# Patient Record
Sex: Male | Born: 1940
Health system: Southern US, Community
[De-identification: ages and names within clinical notes are randomized; demographics above are authoritative.]

## PROBLEM LIST (undated history)

## (undated) DIAGNOSIS — R0602 Shortness of breath: Secondary | ICD-10-CM

## (undated) DIAGNOSIS — E669 Obesity, unspecified: Secondary | ICD-10-CM

## (undated) DIAGNOSIS — F32A Depression, unspecified: Secondary | ICD-10-CM

## (undated) DIAGNOSIS — F419 Anxiety disorder, unspecified: Secondary | ICD-10-CM

## (undated) DIAGNOSIS — G4733 Obstructive sleep apnea (adult) (pediatric): Secondary | ICD-10-CM

## (undated) DIAGNOSIS — H3320 Serous retinal detachment, unspecified eye: Secondary | ICD-10-CM

## (undated) DIAGNOSIS — E785 Hyperlipidemia, unspecified: Secondary | ICD-10-CM

## (undated) DIAGNOSIS — Z9889 Other specified postprocedural states: Secondary | ICD-10-CM

## (undated) DIAGNOSIS — M1990 Unspecified osteoarthritis, unspecified site: Secondary | ICD-10-CM

## (undated) DIAGNOSIS — R112 Nausea with vomiting, unspecified: Secondary | ICD-10-CM

## (undated) DIAGNOSIS — I10 Essential (primary) hypertension: Secondary | ICD-10-CM

## (undated) DIAGNOSIS — Z87442 Personal history of urinary calculi: Secondary | ICD-10-CM

## (undated) DIAGNOSIS — Z9289 Personal history of other medical treatment: Secondary | ICD-10-CM

## (undated) DIAGNOSIS — F329 Major depressive disorder, single episode, unspecified: Secondary | ICD-10-CM

## (undated) DIAGNOSIS — C801 Malignant (primary) neoplasm, unspecified: Secondary | ICD-10-CM

## (undated) DIAGNOSIS — T8859XA Other complications of anesthesia, initial encounter: Secondary | ICD-10-CM

## (undated) DIAGNOSIS — R42 Dizziness and giddiness: Secondary | ICD-10-CM

## (undated) DIAGNOSIS — R519 Headache, unspecified: Secondary | ICD-10-CM

## (undated) DIAGNOSIS — I4891 Unspecified atrial fibrillation: Secondary | ICD-10-CM

## (undated) DIAGNOSIS — I209 Angina pectoris, unspecified: Secondary | ICD-10-CM

## (undated) DIAGNOSIS — G4730 Sleep apnea, unspecified: Secondary | ICD-10-CM

## (undated) DIAGNOSIS — I2510 Atherosclerotic heart disease of native coronary artery without angina pectoris: Secondary | ICD-10-CM

## (undated) DIAGNOSIS — R51 Headache: Secondary | ICD-10-CM

## (undated) DIAGNOSIS — T4145XA Adverse effect of unspecified anesthetic, initial encounter: Secondary | ICD-10-CM

## (undated) HISTORY — DX: Obesity, unspecified: E66.9

## (undated) HISTORY — PX: TONSILLECTOMY: SUR1361

## (undated) HISTORY — PX: EYE SURGERY: SHX253

## (undated) HISTORY — DX: Atherosclerotic heart disease of native coronary artery without angina pectoris: I25.10

## (undated) HISTORY — DX: Hyperlipidemia, unspecified: E78.5

## (undated) HISTORY — PX: APPENDECTOMY: SHX54

## (undated) HISTORY — PX: NASAL SINUS SURGERY: SHX719

## (undated) HISTORY — PX: JOINT REPLACEMENT: SHX530

## (undated) HISTORY — DX: Personal history of other medical treatment: Z92.89

## (undated) HISTORY — DX: Obstructive sleep apnea (adult) (pediatric): G47.33

---

## 2001-06-21 ENCOUNTER — Other Ambulatory Visit: Admission: RE | Admit: 2001-06-21 | Discharge: 2001-06-21 | Payer: Self-pay | Admitting: Dermatology

## 2001-07-10 ENCOUNTER — Ambulatory Visit (HOSPITAL_COMMUNITY): Admission: RE | Admit: 2001-07-10 | Discharge: 2001-07-10 | Payer: Self-pay | Admitting: General Surgery

## 2003-03-20 ENCOUNTER — Other Ambulatory Visit: Admission: RE | Admit: 2003-03-20 | Discharge: 2003-03-20 | Payer: Self-pay | Admitting: Dermatology

## 2003-10-22 ENCOUNTER — Ambulatory Visit (HOSPITAL_COMMUNITY): Admission: RE | Admit: 2003-10-22 | Discharge: 2003-10-22 | Payer: Self-pay | Admitting: Urology

## 2004-11-19 ENCOUNTER — Other Ambulatory Visit: Admission: RE | Admit: 2004-11-19 | Discharge: 2004-11-19 | Payer: Self-pay | Admitting: Dermatology

## 2005-06-10 ENCOUNTER — Ambulatory Visit (HOSPITAL_COMMUNITY): Admission: RE | Admit: 2005-06-10 | Discharge: 2005-06-10 | Payer: Self-pay | Admitting: Family Medicine

## 2005-07-13 HISTORY — PX: CARDIAC CATHETERIZATION: SHX172

## 2005-09-19 ENCOUNTER — Ambulatory Visit (HOSPITAL_COMMUNITY): Admission: RE | Admit: 2005-09-19 | Discharge: 2005-09-19 | Payer: Self-pay | Admitting: General Surgery

## 2009-02-19 DIAGNOSIS — Z9289 Personal history of other medical treatment: Secondary | ICD-10-CM

## 2009-02-19 HISTORY — DX: Personal history of other medical treatment: Z92.89

## 2010-05-26 ENCOUNTER — Ambulatory Visit (HOSPITAL_COMMUNITY)
Admission: RE | Admit: 2010-05-26 | Discharge: 2010-05-26 | Payer: Self-pay | Source: Home / Self Care | Attending: Ophthalmology | Admitting: Ophthalmology

## 2010-07-08 ENCOUNTER — Ambulatory Visit (HOSPITAL_COMMUNITY)
Admission: RE | Admit: 2010-07-08 | Discharge: 2010-07-08 | Disposition: A | Discharge status: Home / Self Care | Payer: Medicare Other | Attending: Ophthalmology | Admitting: Ophthalmology

## 2010-07-08 DIAGNOSIS — Z79899 Other long term (current) drug therapy: Secondary | ICD-10-CM | POA: Insufficient documentation

## 2010-07-08 DIAGNOSIS — H2510 Age-related nuclear cataract, unspecified eye: Secondary | ICD-10-CM | POA: Insufficient documentation

## 2010-07-08 DIAGNOSIS — I10 Essential (primary) hypertension: Secondary | ICD-10-CM | POA: Insufficient documentation

## 2010-08-17 LAB — BASIC METABOLIC PANEL
CO2: 29 mEq/L (ref 19–32)
Chloride: 103 mEq/L (ref 96–112)
Creatinine, Ser: 0.9 mg/dL (ref 0.4–1.5)
GFR calc Af Amer: >60 mL/min (ref >60)
GFR calc non Af Amer: >60 mL/min (ref >60)

## 2010-08-17 LAB — HEMOGLOBIN AND HEMATOCRIT, BLOOD: Hemoglobin: 15.3 g/dL (ref 13.0–17.0)

## 2010-10-22 NOTE — H&P (Signed)
NAME:  Paul Lam, Paul Lam                ACCOUNT NO.:  [id number]   MEDICAL RECORD NO.:  [id number]          PATIENT TYPE:  AMB   LOCATION:                                FACILITY:  [facility]   PHYSICIAN:  Dalia Heading, M.D.  DATE OF BIRTH:  1940/10/05   DATE OF ADMISSION:  DATE OF DISCHARGE:  LH                                HISTORY & PHYSICAL   CHIEF COMPLAINT:  History of colon polyps.   HISTORY OF PRESENT ILLNESS:  The patient is a 70 year old white male status  post colonoscopy with polypectomy in 2003 for a polyp seen in the proximal  transverse colon as well as a history of diverticulosis in the descending  colon who presents for followup colonoscopy.  He denies any gastrointestinal  symptoms.  He denies any family history colon carcinoma.   PAST MEDICAL HISTORY:  Includes hypertension and back spasms.   PAST SURGICAL HISTORY:  Tonsillectomy, appendectomy.   CURRENT MEDICATIONS:  Ziac, baby aspirin, Naprosyn, Flexeril.   ALLERGIES:  No known drug allergies.   REVIEW OF SYSTEMS:  The patient denies drinking or smoking.  Denies any  other cardiopulmonary difficulties or bleeding disorders.   PHYSICAL EXAMINATION:  GENERAL:  The patient is a well-developed, well-  nourished white male in no acute distress.  LUNGS:  Clear to auscultation with equal breath sounds bilaterally.  HEART:  Reveals a regular rate and rhythm without S3, S4, or murmurs.  ABDOMEN:  Soft, nontender, nondistended.  RECTAL:  Examination was deferred to the procedure.   IMPRESSION:  History of colon polyp.   PLAN:  The patient is scheduled for colonoscopy on September 19, 2005.  The  risks and benefits of the procedure including bleeding and perforation were  fully explained to the patient, who gave informed consent.      Dalia Heading, M.D.  Electronically Signed     MAJ/MEDQ  D:  09/15/2005  T:  09/15/2005  Job:  [id number]   cc:   Kirk Ruths, M.D.  Fax: [phone]

## 2011-03-14 ENCOUNTER — Encounter (INDEPENDENT_AMBULATORY_CARE_PROVIDER_SITE_OTHER): Payer: Medicare Other | Admitting: Ophthalmology

## 2011-03-14 DIAGNOSIS — H33309 Unspecified retinal break, unspecified eye: Secondary | ICD-10-CM

## 2011-03-14 DIAGNOSIS — H2700 Aphakia, unspecified eye: Secondary | ICD-10-CM

## 2011-03-14 DIAGNOSIS — D3130 Benign neoplasm of unspecified choroid: Secondary | ICD-10-CM

## 2011-03-14 DIAGNOSIS — H35039 Hypertensive retinopathy, unspecified eye: Secondary | ICD-10-CM

## 2011-03-17 ENCOUNTER — Encounter (HOSPITAL_COMMUNITY): Payer: Self-pay | Admitting: *Deleted

## 2011-03-25 ENCOUNTER — Encounter (INDEPENDENT_AMBULATORY_CARE_PROVIDER_SITE_OTHER): Payer: Medicare Other | Admitting: Ophthalmology

## 2011-03-25 DIAGNOSIS — H33309 Unspecified retinal break, unspecified eye: Secondary | ICD-10-CM

## 2011-03-25 DIAGNOSIS — H33009 Unspecified retinal detachment with retinal break, unspecified eye: Secondary | ICD-10-CM

## 2011-03-29 ENCOUNTER — Ambulatory Visit (HOSPITAL_COMMUNITY)
Admission: RE | Admit: 2011-03-29 | Discharge: 2011-03-30 | Disposition: A | Discharge status: Home / Self Care | Payer: Medicare Other | Source: Ambulatory Visit | Attending: Ophthalmology | Admitting: Ophthalmology

## 2011-03-29 ENCOUNTER — Ambulatory Visit (HOSPITAL_COMMUNITY): Payer: Medicare Other

## 2011-03-29 DIAGNOSIS — Z9889 Other specified postprocedural states: Secondary | ICD-10-CM

## 2011-03-29 DIAGNOSIS — H33009 Unspecified retinal detachment with retinal break, unspecified eye: Secondary | ICD-10-CM

## 2011-03-29 DIAGNOSIS — N400 Enlarged prostate without lower urinary tract symptoms: Secondary | ICD-10-CM | POA: Insufficient documentation

## 2011-03-29 DIAGNOSIS — Z01812 Encounter for preprocedural laboratory examination: Secondary | ICD-10-CM | POA: Insufficient documentation

## 2011-03-29 DIAGNOSIS — I2510 Atherosclerotic heart disease of native coronary artery without angina pectoris: Secondary | ICD-10-CM | POA: Insufficient documentation

## 2011-03-29 DIAGNOSIS — M109 Gout, unspecified: Secondary | ICD-10-CM | POA: Insufficient documentation

## 2011-03-29 DIAGNOSIS — G4733 Obstructive sleep apnea (adult) (pediatric): Secondary | ICD-10-CM | POA: Insufficient documentation

## 2011-03-29 DIAGNOSIS — K5730 Diverticulosis of large intestine without perforation or abscess without bleeding: Secondary | ICD-10-CM | POA: Insufficient documentation

## 2011-03-29 DIAGNOSIS — Z01818 Encounter for other preprocedural examination: Secondary | ICD-10-CM | POA: Insufficient documentation

## 2011-03-29 LAB — CBC
HCT: 41.7 % (ref 39.0–52.0)
Hemoglobin: 13.7 g/dL (ref 13.0–17.0)
MCHC: 32.9 g/dL (ref 30.0–36.0)
MCV: 86.2 fL (ref 78.0–100.0)
Platelets: 137 10*3/uL — ABNORMAL LOW (ref 150–400)
RDW: 13.3 % (ref 11.5–15.5)

## 2011-03-29 LAB — BASIC METABOLIC PANEL
Chloride: 104 mEq/L (ref 96–112)
Creatinine, Ser: 0.95 mg/dL (ref 0.50–1.35)
Glucose, Bld: 101 mg/dL — ABNORMAL HIGH (ref 70–99)

## 2011-03-31 NOTE — Op Note (Signed)
  NAMECRISTAN, SCHERZER NO.:  [id number]  MEDICAL RECORD NO.:  [id number]  LOCATION:  5123                         FACILITY:  [facility]  PHYSICIAN:  Beulah Gandy. Ashley Royalty, M.D. DATE OF BIRTH:  05/04/1941  DATE OF PROCEDURE:  03/29/2011 DATE OF DISCHARGE:                              OPERATIVE REPORT   ADMISSION DIAGNOSIS:  Rhegmatogenous retinal detachment, right eye.  PROCEDURE:  Scleral buckle, right eye; gas injection, right eye; retinal photocoagulation, right eye.  SURGEON:  Beulah Gandy. Ashley Royalty, MD  ASSISTANT:  Rosalie Doctor SA.  ANESTHESIA:  General.  DETAILS:  Usual prep and drape, 360-degree limbal peritomy, isolation of 4 rectus muscles on 2-0 silk, scleral dissection for 360 degrees to admit a #279 implant with 2-mm trim from the posterior edge, except at the 12 and 1 o'clock region.  Diathermy placed in the bed.  The 240 band placed around the eye with a 270 sleeve at 2 o'clock.  The 279 implant was jointed at 4 o'clock.  Perforation site chosen at 12 o'clock.  The 508G radial segment was placed at 12 o'clock as well.  The perforation was made and a moderate amount of clear, colorless subretinal fluid came forth in a controlled manner.  It stopped on its own.  The buckle elements were placed and the scleral flaps were closed with 2 interrupted 4-0 Mersilene sutures per quadrant for a total of eight 4-0 Mersilene sutures.  The buckle was adjusted and trimmed.  The band was adjusted and trimmed.  Indirect ophthalmoscopy shows that the retina was lying nicely on the scleral buckle with the breaks well supported. Minimal subretinal fluid was remaining.  The indirect ophthalmoscope laser was moved into place, 813 burns were placed around the retinal breaks on the buckle, power was 400 mW, 1000 microns each, and 0.1 seconds each.  The suture ends were knotted and the free ends removed. Paracentesis x2 obtained a closing pressure of 10 with a Barraquer tonometer.   The conjunctiva was reposited with 7-0 chromic suture. Polymyxin and gentamicin were irrigated into [place] space.  Marcaine was injected around the globe for postop pain.  Polysporin ophthalmic ointment, a patch and shield were placed.  Closing pressure was 10 with a Barraquer tonometer.  Complications none.  Operative time 2 hours. The patient was awakened and taken to recovery in satisfactory condition.     Beulah Gandy. Ashley Royalty, M.D.     JDM/MEDQ  D:  03/29/2011  T:  03/30/2011  Job:  [id number]  Electronically Signed by Alan Mulder M.D. on 03/31/2011 02:12:35 PM

## 2011-04-06 ENCOUNTER — Inpatient Hospital Stay (INDEPENDENT_AMBULATORY_CARE_PROVIDER_SITE_OTHER): Payer: Medicare Other | Admitting: Ophthalmology

## 2011-04-06 DIAGNOSIS — H33009 Unspecified retinal detachment with retinal break, unspecified eye: Secondary | ICD-10-CM

## 2011-04-07 HISTORY — PX: RETINAL DETACHMENT SURGERY: SHX105

## 2011-04-27 ENCOUNTER — Encounter (INDEPENDENT_AMBULATORY_CARE_PROVIDER_SITE_OTHER): Payer: Medicare Other | Admitting: Ophthalmology

## 2011-04-27 DIAGNOSIS — H33009 Unspecified retinal detachment with retinal break, unspecified eye: Secondary | ICD-10-CM

## 2011-05-06 ENCOUNTER — Other Ambulatory Visit: Payer: Self-pay | Admitting: Orthopedic Surgery

## 2011-05-16 ENCOUNTER — Encounter (HOSPITAL_COMMUNITY): Admission: RE | Payer: Self-pay | Source: Ambulatory Visit

## 2011-05-16 ENCOUNTER — Inpatient Hospital Stay (HOSPITAL_COMMUNITY): Admission: RE | Admit: 2011-05-16 | Payer: Medicare Other | Source: Ambulatory Visit | Admitting: Orthopedic Surgery

## 2011-05-16 SURGERY — ARTHROPLASTY, KNEE, TOTAL
Anesthesia: Choice | Site: Knee | Laterality: Right

## 2011-05-18 NOTE — Anesthesia Postprocedure Evaluation (Deleted)
Anesthesia Post Note  Patient: Paul Lam  Procedure(s) Performed: * No surgery found *  Anesthesia type: Epidural  Patient location: Mother/Baby  Post pain: Pain level controlled  Post assessment: Post-op Vital signs reviewed  Last Vitals: There were no vitals filed for this visit.  Post vital signs: Reviewed  Level of consciousness: awake  Complications: No apparent anesthesia complications

## 2011-06-07 HISTORY — PX: SKIN CANCER EXCISION: SHX779

## 2011-06-20 DIAGNOSIS — N320 Bladder-neck obstruction: Secondary | ICD-10-CM | POA: Diagnosis not present

## 2011-06-20 DIAGNOSIS — N400 Enlarged prostate without lower urinary tract symptoms: Secondary | ICD-10-CM | POA: Diagnosis not present

## 2011-06-27 DIAGNOSIS — H52229 Regular astigmatism, unspecified eye: Secondary | ICD-10-CM | POA: Diagnosis not present

## 2011-06-27 DIAGNOSIS — H5231 Anisometropia: Secondary | ICD-10-CM | POA: Diagnosis not present

## 2011-06-27 DIAGNOSIS — H35379 Puckering of macula, unspecified eye: Secondary | ICD-10-CM | POA: Diagnosis not present

## 2011-06-27 DIAGNOSIS — H524 Presbyopia: Secondary | ICD-10-CM | POA: Diagnosis not present

## 2011-07-06 ENCOUNTER — Encounter (INDEPENDENT_AMBULATORY_CARE_PROVIDER_SITE_OTHER): Payer: Medicare Other | Admitting: Ophthalmology

## 2011-07-06 DIAGNOSIS — H43819 Vitreous degeneration, unspecified eye: Secondary | ICD-10-CM

## 2011-07-06 DIAGNOSIS — H35039 Hypertensive retinopathy, unspecified eye: Secondary | ICD-10-CM | POA: Diagnosis not present

## 2011-07-06 DIAGNOSIS — H33009 Unspecified retinal detachment with retinal break, unspecified eye: Secondary | ICD-10-CM

## 2011-07-06 DIAGNOSIS — I10 Essential (primary) hypertension: Secondary | ICD-10-CM | POA: Diagnosis not present

## 2011-07-06 DIAGNOSIS — D3130 Benign neoplasm of unspecified choroid: Secondary | ICD-10-CM

## 2011-08-01 DIAGNOSIS — D485 Neoplasm of uncertain behavior of skin: Secondary | ICD-10-CM | POA: Diagnosis not present

## 2011-08-01 DIAGNOSIS — C44211 Basal cell carcinoma of skin of unspecified ear and external auricular canal: Secondary | ICD-10-CM | POA: Diagnosis not present

## 2011-08-01 DIAGNOSIS — L910 Hypertrophic scar: Secondary | ICD-10-CM | POA: Diagnosis not present

## 2011-08-01 DIAGNOSIS — Z85828 Personal history of other malignant neoplasm of skin: Secondary | ICD-10-CM | POA: Diagnosis not present

## 2011-08-08 NOTE — H&P (Signed)
Paul Lam DOB: 08-06-1940   Chief Complaint: Right knee pain  History of Present Illness The patient is a 71 year old male who comes in today for a preoperative History and Physical. The patient is scheduled for a right total knee arthroplasty to be performed by Dr. Gus Lam. Aluisio, MD at [facility] on August 22, 2011 . Paul Lam has knee pain which began 1 year ago. He has had a cortisone injection in the knee before by Dr. Shelle Lam that provided very little relief. The knee is progressively getting worse. It is limiting what he can and cannot do. He is having swelling and instability, feeling like the knee is going to give out. He discussed the options of cortisone injections and viscosupplementation but with his end stage arthritis he is looking for a more permanent solution to his knee pain. Right total knee arthroplasty bect chance for increased function and decreased pain. he received surgical clearance from Dr. Regino Lam. PCP: Dr. Regino Lam Cardio: Dr. Rennis Lam Eye: Dr. Ashley Lam   Past MedicalHistory Osteoarthritis, knee (715.96) *SYNOVITIS KNEE. 10/26/1992 *SPRAIN/STRAIN UNSPEC.KNEE & LEG. 10/26/1992 High blood pressure Skin Cancer Sleep Apnea. CPAP Anxiety Disorder Impaired Vision. retinal detatchment 2012 Cataract Hypercholesterolemia Benign Prostatic Hypertrophy Kidney Stone Gout   Allergies No Known Drug Allergies. 02/08/2011   Family History Cerebrovascular Accident. father Congestive Heart Failure. father Heart Disease. mother and father Hypertension. father Osteoarthritis. mother Father. deceased 86 stroke, myocardial infarction Mother. deceased age 5 myocardial infarction   Social History Drug/Alcohol Rehab (Currently). no Exercise. Exercises rarely; does running / walking Illicit drug use. no Current work status. working part time Alcohol use. current drinker; only occasionally per week Children. 1 Copy of  Drug/Alcohol Rehab (Previously). no Living situation. live with spouse Previously in rehab. no Tobacco use. never smoker Pain Contract. no Marital status. married Most recent primary occupation. farmer Number of flights of stairs before winded. 2-3 Caregiver. after surgery: wife Advance Directives. healthcare power of attorney   Medication History Bisoprolol-Hydrochlorothiazide (2.5-6.25MG  Tablet, Oral) Active. Vytorin (10-20MG  Tablet, Oral) Active. Potassium Chloride (20 MEQ/15ML(10%) Liquid, Oral) Active. Lasix (20MG  Tablet, Oral) Active. Lisinopril (10MG  Tablet, Oral) Active. Nabumetone (750MG  Tablet, Oral) Active. Uroxatral (10MG  Tablet ER 24HR, Oral) Active. Aspirin EC Low Strength (81MG  Tablet DR, Oral) Active. Xanax (0.5MG  Tablet, Oral) Active. Simvastatin (40MG  Tablet, Oral) Active. Fish Oil (1000MG  Capsule DR, Oral) Active.   Past Surgical History Tonsillectomy. 1950 Sinus Surgery. 1968 Appendectomy. 1950 Cataract Surgery. bilateral 2011 Colon Polyp Removal - Colonoscopy Retinal Detatchment Repair. 2012   Diagnostic Studies History Chest X-ray. Normal. 03/2011   Review of Systems General:Not Present- Chills, Fever, Night Sweats, Fatigue, Weight Gain, Weight Loss and Memory Loss. Skin:Not Present- Hives, Itching, Rash, Eczema and Lesions. HEENT:Present- Tinnitus and Blurred Vision (right eye). Not Present- Headache, Double Vision, Visual Loss, Hearing Loss and Dentures. Respiratory:Not Present- Shortness of breath with exertion, Shortness of breath at rest, Allergies, Coughing up blood and Chronic Cough. Cardiovascular:Not Present- Chest Pain, Racing/skipping heartbeats, Difficulty Breathing Lying Down, Murmur, Swelling and Palpitations. Gastrointestinal:Not Present- Bloody Stool, Heartburn, Abdominal Pain, Vomiting, Nausea, Constipation, Diarrhea, Difficulty Swallowing, Jaundice and Loss of appetitie. Male Genitourinary:Not Present-  Urinary frequency, Blood in Urine, Weak urinary stream, Discharge, Flank Pain, Incontinence, Painful Urination, Urgency, Urinary Retention and Urinating at Night. Musculoskeletal:Present- Joint Swelling and Joint Pain. Not Present- Muscle Weakness, Muscle Pain, Back Pain, Morning Stiffness and Spasms. Neurological:Not Present- Tremor, Dizziness, Blackout spells, Paralysis, Difficulty with balance and Weakness. Psychiatric:Not Present- Insomnia.   Vitals 08/08/2011 1:53  PM Weight: 272 lb Height: 67 in Body Surface Area: 2.42 m Body Mass Index: 42.6 kg/m Pulse: 88 (Regular) Resp.: 18 (Unlabored) BP: 130/68 (Sitting, Left Arm, Standard)    Physical Exam General Mental Status - Alert, cooperative and good historian. General Appearance- pleasant. Not in acute distress. Orientation- Oriented X3. Build & Nutrition- Obese and Well developed. Head and Neck Head- normocephalic, atraumatic . Neck Global Assessment- supple. no bruit auscultated on the right and no bruit auscultated on the left. Eye Pupil- Bilateral- PERRLA. Motion- Bilateral- EOMI. Chest and Lung Exam Auscultation: Breath sounds:- clear at anterior chest wall and - clear at posterior chest wall. Adventitious sounds:- No Adventitious sounds. Cardiovascular Auscultation:Rhythm- Regular rate and rhythm. Heart Sounds- S1 WNL and S2 WNL. Murmurs & Other Heart Sounds:Auscultation of the heart reveals - No Murmurs. Abdomen Palpation/Percussion:Tenderness- Abdomen is non-tender to palpation. Rigidity (guarding)- Abdomen is soft. Auscultation:Auscultation of the abdomen reveals - Bowel sounds normal. Male Genitourinary Not done, not pertinent to present illness Peripheral Vascular Upper Extremity: Palpation:- Pulses bilaterally normal. Lower Extremity: Palpation:- Pulses bilaterally normal. Neurologic Examination of related systems reveals - normal muscle strength and tone in  all extremities. Neurologic evaluation reveals - normal sensation and upper and lower extremity deep tendon reflexes intact bilaterally . Musculoskeletal Examination of his hips show normal range of motion with no discomfort. His left knee shows no effusion. His range of motion of the left knee is about 5 to 125. There is no medial or lateral joint line tenderness and no instability about the left knee. Right knee shows no effusion. He has range 5 to 120. There is marked crepitus on range of motion in the right knee. He is tender medial greater than lateral. There is no instability noted about the right knee. Gait pattern is antalgic on the right. Painful ROM in the right knee since a fall earlier same day of exam    RADIOGRAPHS: AP of both knees and lateral of the right show that he has advanced endstage arthritis of the right knee, bone on bone in the medial and patellofemoral compartments.  Assessment & Plan Osteoarthritis, knee (715.96) Right total knee arthroplasty   Dimitri Ped, PA-C

## 2011-08-11 ENCOUNTER — Encounter (HOSPITAL_COMMUNITY): Payer: Self-pay | Admitting: Pharmacy Technician

## 2011-08-15 ENCOUNTER — Encounter (HOSPITAL_COMMUNITY)
Admission: RE | Admit: 2011-08-15 | Discharge: 2011-08-15 | Disposition: A | Discharge status: Home / Self Care | Payer: Medicare Other | Source: Ambulatory Visit | Attending: Orthopedic Surgery | Admitting: Orthopedic Surgery

## 2011-08-15 ENCOUNTER — Ambulatory Visit (HOSPITAL_COMMUNITY)
Admission: RE | Admit: 2011-08-15 | Discharge: 2011-08-15 | Disposition: A | Discharge status: Home / Self Care | Payer: Medicare Other | Source: Ambulatory Visit | Attending: Orthopedic Surgery | Admitting: Orthopedic Surgery

## 2011-08-15 ENCOUNTER — Encounter (HOSPITAL_COMMUNITY): Payer: Self-pay

## 2011-08-15 DIAGNOSIS — Z01812 Encounter for preprocedural laboratory examination: Secondary | ICD-10-CM | POA: Insufficient documentation

## 2011-08-15 DIAGNOSIS — Z01818 Encounter for other preprocedural examination: Secondary | ICD-10-CM | POA: Insufficient documentation

## 2011-08-15 DIAGNOSIS — Z01811 Encounter for preprocedural respiratory examination: Secondary | ICD-10-CM | POA: Diagnosis not present

## 2011-08-15 HISTORY — DX: Unspecified osteoarthritis, unspecified site: M19.90

## 2011-08-15 HISTORY — DX: Essential (primary) hypertension: I10

## 2011-08-15 HISTORY — DX: Shortness of breath: R06.02

## 2011-08-15 HISTORY — DX: Anxiety disorder, unspecified: F41.9

## 2011-08-15 HISTORY — DX: Malignant (primary) neoplasm, unspecified: C80.1

## 2011-08-15 HISTORY — DX: Sleep apnea, unspecified: G47.30

## 2011-08-15 LAB — COMPREHENSIVE METABOLIC PANEL
Albumin: 4 g/dL (ref 3.5–5.2)
Alkaline Phosphatase: 95 U/L (ref 39–117)
BUN: 18 mg/dL (ref 6–23)
CO2: 29 mEq/L (ref 19–32)
Chloride: 99 mEq/L (ref 96–112)
GFR calc non Af Amer: 86 mL/min — ABNORMAL LOW (ref >90)
Potassium: 4.3 mEq/L (ref 3.5–5.1)
Total Bilirubin: 0.5 mg/dL (ref 0.3–1.2)

## 2011-08-15 LAB — URINALYSIS, ROUTINE W REFLEX MICROSCOPIC
Bilirubin Urine: NEGATIVE
Hgb urine dipstick: NEGATIVE
Specific Gravity, Urine: 1.018 (ref 1.005–1.030)
Urobilinogen, UA: 0.2 mg/dL (ref 0.0–1.0)
pH: 6 (ref 5.0–8.0)

## 2011-08-15 LAB — SURGICAL PCR SCREEN: Staphylococcus aureus: NEGATIVE

## 2011-08-15 LAB — CBC
HCT: 41.6 % (ref 39.0–52.0)
Hemoglobin: 14 g/dL (ref 13.0–17.0)
RBC: 4.85 MIL/uL (ref 4.22–5.81)
RDW: 13.3 % (ref 11.5–15.5)
WBC: 8.7 10*3/uL (ref 4.0–10.5)

## 2011-08-15 LAB — APTT: aPTT: 36 seconds (ref 24–37)

## 2011-08-15 LAB — PROTIME-INR: Prothrombin Time: 14.5 seconds (ref 11.6–15.2)

## 2011-08-15 MED ORDER — CHLORHEXIDINE GLUCONATE 4 % EX LIQD
60.0000 mL | Freq: Once | CUTANEOUS | Status: DC
  Filled 2011-08-15: qty 60

## 2011-08-15 NOTE — Patient Instructions (Signed)
20 ALOYSIOUS VANGIESON  08/15/2011   Your procedure is scheduled on:  08/22/11 [id number]  Report to [facility] at 0515 AM.  Call this number if you have problems the morning of surgery: [phone]   Remember:   Do not eat food:After Midnight.  May have clear liquids:until Midnight .    Take these medicines the morning of surgery with A SIP OF WATER:    Do not wear jewelry,   Do not wear lotions, powders, or perfumes.     Do not bring valuables to the hospital.  Contacts, dentures or bridgework may not be worn into surgery.  Leave suitcase in the car. After surgery it may be brought to your room.  For patients admitted to the hospital, checkout time is 11:00 AM the day of discharge.     Special Instructions: CHG Shower Use Special Wash: 1/2 bottle night before surgery and 1/2 bottle morning of surgery. shower chin to toes with CHG.  Wash face and private parts with regular soap.    Please read over the following fact sheets that you were given: MRSA Information, Blood Transfusion Fact Sheet, Incentive Spirometry Fact Sheet, coughing and deep breathing exercises, leg exercises

## 2011-08-22 ENCOUNTER — Encounter (HOSPITAL_COMMUNITY)
Admission: RE | Disposition: A | Discharge status: Skilled Nursing Facility | Payer: Self-pay | Source: Ambulatory Visit | Attending: Orthopedic Surgery

## 2011-08-22 ENCOUNTER — Encounter (HOSPITAL_COMMUNITY): Payer: Self-pay | Admitting: Anesthesiology

## 2011-08-22 ENCOUNTER — Inpatient Hospital Stay (HOSPITAL_COMMUNITY)
Admission: RE | Admit: 2011-08-22 | Discharge: 2011-08-25 | DRG: 470 | Disposition: A | Discharge status: Skilled Nursing Facility | Payer: Medicare Other | Source: Ambulatory Visit | Attending: Orthopedic Surgery | Admitting: Orthopedic Surgery

## 2011-08-22 ENCOUNTER — Inpatient Hospital Stay (HOSPITAL_COMMUNITY): Payer: Medicare Other | Admitting: Anesthesiology

## 2011-08-22 ENCOUNTER — Encounter (HOSPITAL_COMMUNITY): Payer: Self-pay

## 2011-08-22 DIAGNOSIS — M109 Gout, unspecified: Secondary | ICD-10-CM | POA: Diagnosis present

## 2011-08-22 DIAGNOSIS — Z79899 Other long term (current) drug therapy: Secondary | ICD-10-CM | POA: Diagnosis not present

## 2011-08-22 DIAGNOSIS — I10 Essential (primary) hypertension: Secondary | ICD-10-CM | POA: Diagnosis present

## 2011-08-22 DIAGNOSIS — Z96659 Presence of unspecified artificial knee joint: Secondary | ICD-10-CM | POA: Diagnosis not present

## 2011-08-22 DIAGNOSIS — M1990 Unspecified osteoarthritis, unspecified site: Secondary | ICD-10-CM | POA: Diagnosis not present

## 2011-08-22 DIAGNOSIS — G4730 Sleep apnea, unspecified: Secondary | ICD-10-CM | POA: Diagnosis present

## 2011-08-22 DIAGNOSIS — N400 Enlarged prostate without lower urinary tract symptoms: Secondary | ICD-10-CM | POA: Diagnosis present

## 2011-08-22 DIAGNOSIS — E7800 Pure hypercholesterolemia, unspecified: Secondary | ICD-10-CM | POA: Diagnosis present

## 2011-08-22 DIAGNOSIS — M1710 Unilateral primary osteoarthritis, unspecified knee: Principal | ICD-10-CM | POA: Diagnosis present

## 2011-08-22 DIAGNOSIS — Z87442 Personal history of urinary calculi: Secondary | ICD-10-CM

## 2011-08-22 DIAGNOSIS — F411 Generalized anxiety disorder: Secondary | ICD-10-CM | POA: Diagnosis present

## 2011-08-22 DIAGNOSIS — M25569 Pain in unspecified knee: Secondary | ICD-10-CM | POA: Diagnosis not present

## 2011-08-22 DIAGNOSIS — Z471 Aftercare following joint replacement surgery: Secondary | ICD-10-CM | POA: Diagnosis not present

## 2011-08-22 DIAGNOSIS — IMO0002 Reserved for concepts with insufficient information to code with codable children: Secondary | ICD-10-CM | POA: Diagnosis not present

## 2011-08-22 DIAGNOSIS — E871 Hypo-osmolality and hyponatremia: Secondary | ICD-10-CM | POA: Diagnosis not present

## 2011-08-22 DIAGNOSIS — H547 Unspecified visual loss: Secondary | ICD-10-CM | POA: Diagnosis present

## 2011-08-22 DIAGNOSIS — M6281 Muscle weakness (generalized): Secondary | ICD-10-CM | POA: Diagnosis not present

## 2011-08-22 DIAGNOSIS — M21169 Varus deformity, not elsewhere classified, unspecified knee: Secondary | ICD-10-CM | POA: Diagnosis present

## 2011-08-22 DIAGNOSIS — M179 Osteoarthritis of knee, unspecified: Secondary | ICD-10-CM | POA: Diagnosis present

## 2011-08-22 DIAGNOSIS — R262 Difficulty in walking, not elsewhere classified: Secondary | ICD-10-CM | POA: Diagnosis not present

## 2011-08-22 DIAGNOSIS — Z5189 Encounter for other specified aftercare: Secondary | ICD-10-CM | POA: Diagnosis not present

## 2011-08-22 DIAGNOSIS — R269 Unspecified abnormalities of gait and mobility: Secondary | ICD-10-CM | POA: Diagnosis not present

## 2011-08-22 HISTORY — PX: TOTAL KNEE ARTHROPLASTY: SHX125

## 2011-08-22 LAB — TYPE AND SCREEN
ABO/RH(D): O POS
Antibody Screen: NEGATIVE

## 2011-08-22 SURGERY — ARTHROPLASTY, KNEE, TOTAL
Anesthesia: Spinal | Site: Knee | Laterality: Right | Wound class: Clean

## 2011-08-22 MED ORDER — SODIUM CHLORIDE 0.9 % IR SOLN
Status: DC | PRN
  Administered 2011-08-22: 3000 mL

## 2011-08-22 MED ORDER — DEXAMETHASONE SODIUM PHOSPHATE 10 MG/ML IJ SOLN: 10.0000 mg | Freq: Once | INTRAMUSCULAR | Status: DC

## 2011-08-22 MED ORDER — METHOCARBAMOL 100 MG/ML IJ SOLN
500.0000 mg | Freq: Four times a day (QID) | INTRAVENOUS | Status: DC | PRN
  Administered 2011-08-22: 500 mg via INTRAVENOUS
  Filled 2011-08-22: qty 5

## 2011-08-22 MED ORDER — BISOPROLOL-HYDROCHLOROTHIAZIDE 2.5-6.25 MG PO TABS
1.0000 | ORAL_TABLET | Freq: Every day | ORAL | Status: DC
  Administered 2011-08-23 – 2011-08-25 (×3): 1 via ORAL
  Filled 2011-08-22 (×3): qty 1

## 2011-08-22 MED ORDER — SIMVASTATIN 40 MG PO TABS
40.0000 mg | ORAL_TABLET | Freq: Every day | ORAL | Status: DC
  Administered 2011-08-22 – 2011-08-24 (×3): 40 mg via ORAL
  Filled 2011-08-22 (×4): qty 1

## 2011-08-22 MED ORDER — RIVAROXABAN 10 MG PO TABS
10.0000 mg | ORAL_TABLET | Freq: Every day | ORAL | Status: DC
  Administered 2011-08-23 – 2011-08-25 (×3): 10 mg via ORAL
  Filled 2011-08-22 (×3): qty 1

## 2011-08-22 MED ORDER — PROMETHAZINE HCL 25 MG/ML IJ SOLN: 6.2500 mg | INTRAMUSCULAR | Status: DC | PRN

## 2011-08-22 MED ORDER — BUPIVACAINE 0.25 % ON-Q PUMP SINGLE CATH 300ML
300.0000 mL | INJECTION | Status: DC
  Administered 2011-08-22: 300 mL

## 2011-08-22 MED ORDER — CHLORHEXIDINE GLUCONATE 4 % EX LIQD: 60.0000 mL | Freq: Once | CUTANEOUS | Status: DC

## 2011-08-22 MED ORDER — ALPRAZOLAM 0.5 MG PO TABS
0.5000 mg | ORAL_TABLET | Freq: Three times a day (TID) | ORAL | Status: DC | PRN
Start: 2011-08-22 — End: 2011-08-25
  Administered 2011-08-22 – 2011-08-24 (×3): 0.5 mg via ORAL
  Filled 2011-08-22 (×3): qty 1

## 2011-08-22 MED ORDER — ONDANSETRON HCL 4 MG PO TABS
4.0000 mg | ORAL_TABLET | Freq: Four times a day (QID) | ORAL | Status: DC | PRN
  Administered 2011-08-23: 4 mg via ORAL
  Filled 2011-08-22: qty 1

## 2011-08-22 MED ORDER — POTASSIUM CHLORIDE CRYS ER 20 MEQ PO TBCR
20.0000 meq | EXTENDED_RELEASE_TABLET | Freq: Every day | ORAL | Status: DC
  Administered 2011-08-23 – 2011-08-25 (×3): 20 meq via ORAL
  Filled 2011-08-22 (×4): qty 1

## 2011-08-22 MED ORDER — DIPHENHYDRAMINE HCL 12.5 MG/5ML PO ELIX: 12.5000 mg | ORAL_SOLUTION | ORAL | Status: DC | PRN

## 2011-08-22 MED ORDER — ACETAMINOPHEN 650 MG RE SUPP: 650.0000 mg | Freq: Four times a day (QID) | RECTAL | Status: DC | PRN

## 2011-08-22 MED ORDER — MORPHINE SULFATE 2 MG/ML IJ SOLN
1.0000 mg | INTRAMUSCULAR | Status: DC | PRN
  Administered 2011-08-22 – 2011-08-23 (×3): 1 mg via INTRAVENOUS
  Filled 2011-08-22 (×2): qty 1

## 2011-08-22 MED ORDER — EPHEDRINE SULFATE 50 MG/ML IJ SOLN
INTRAMUSCULAR | Status: DC | PRN
  Administered 2011-08-22 (×5): 5 mg via INTRAVENOUS

## 2011-08-22 MED ORDER — OXYCODONE HCL 5 MG PO TABS
5.0000 mg | ORAL_TABLET | ORAL | Status: DC | PRN
  Administered 2011-08-22 – 2011-08-25 (×14): 10 mg via ORAL
  Filled 2011-08-22 (×15): qty 2

## 2011-08-22 MED ORDER — FUROSEMIDE 20 MG PO TABS
20.0000 mg | ORAL_TABLET | Freq: Every day | ORAL | Status: DC
  Administered 2011-08-23 – 2011-08-25 (×3): 20 mg via ORAL
  Filled 2011-08-22 (×3): qty 1

## 2011-08-22 MED ORDER — ACETAMINOPHEN 10 MG/ML IV SOLN
INTRAVENOUS | Status: AC
  Filled 2011-08-22: qty 100

## 2011-08-22 MED ORDER — SODIUM CHLORIDE 0.9 % IR SOLN
Status: DC | PRN
  Administered 2011-08-22: 1000 mL

## 2011-08-22 MED ORDER — HYDROMORPHONE HCL PF 1 MG/ML IJ SOLN
0.2500 mg | INTRAMUSCULAR | Status: DC | PRN
  Administered 2011-08-22 (×2): 0.5 mg via INTRAVENOUS

## 2011-08-22 MED ORDER — TAMSULOSIN HCL 0.4 MG PO CAPS
0.4000 mg | ORAL_CAPSULE | Freq: Every day | ORAL | Status: DC
  Administered 2011-08-22 – 2011-08-25 (×4): 0.4 mg via ORAL
  Filled 2011-08-22 (×4): qty 1

## 2011-08-22 MED ORDER — ACETAMINOPHEN 10 MG/ML IV SOLN
1000.0000 mg | Freq: Four times a day (QID) | INTRAVENOUS | Status: AC
  Administered 2011-08-22 – 2011-08-23 (×4): 1000 mg via INTRAVENOUS
  Filled 2011-08-22 (×3): qty 100

## 2011-08-22 MED ORDER — LACTATED RINGERS IV SOLN
INTRAVENOUS | Status: DC | PRN
  Administered 2011-08-22 (×2): via INTRAVENOUS

## 2011-08-22 MED ORDER — BUPIVACAINE ON-Q PAIN PUMP (FOR ORDER SET NO CHG)
INJECTION | Status: DC
  Filled 2011-08-22: qty 1

## 2011-08-22 MED ORDER — CEFAZOLIN SODIUM-DEXTROSE 2-3 GM-% IV SOLR
2.0000 g | INTRAVENOUS | Status: AC
  Administered 2011-08-22: 2 g via INTRAVENOUS

## 2011-08-22 MED ORDER — ONDANSETRON HCL 4 MG/2ML IJ SOLN
INTRAMUSCULAR | Status: DC | PRN
  Administered 2011-08-22: 4 mg via INTRAVENOUS

## 2011-08-22 MED ORDER — SODIUM CHLORIDE 0.9 % IV SOLN: INTRAVENOUS | Status: DC

## 2011-08-22 MED ORDER — METOCLOPRAMIDE HCL 10 MG PO TABS: 5.0000 mg | ORAL_TABLET | Freq: Three times a day (TID) | ORAL | Status: DC | PRN

## 2011-08-22 MED ORDER — PHENOL 1.4 % MT LIQD: 1.0000 | OROMUCOSAL | Status: DC | PRN

## 2011-08-22 MED ORDER — PROPOFOL 10 MG/ML IV EMUL
INTRAVENOUS | Status: DC | PRN
  Administered 2011-08-22: 50 ug/kg/min via INTRAVENOUS

## 2011-08-22 MED ORDER — ACETAMINOPHEN 325 MG PO TABS: 650.0000 mg | ORAL_TABLET | Freq: Four times a day (QID) | ORAL | Status: DC | PRN

## 2011-08-22 MED ORDER — MENTHOL 3 MG MT LOZG: 1.0000 | LOZENGE | OROMUCOSAL | Status: DC | PRN

## 2011-08-22 MED ORDER — CEFAZOLIN SODIUM 1-5 GM-% IV SOLN
1.0000 g | Freq: Four times a day (QID) | INTRAVENOUS | Status: AC
  Administered 2011-08-22 – 2011-08-23 (×3): 1 g via INTRAVENOUS
  Filled 2011-08-22 (×2): qty 50

## 2011-08-22 MED ORDER — FLEET ENEMA 7-19 GM/118ML RE ENEM: 1.0000 | ENEMA | Freq: Once | RECTAL | Status: AC | PRN

## 2011-08-22 MED ORDER — ONDANSETRON HCL 4 MG/2ML IJ SOLN: 4.0000 mg | Freq: Four times a day (QID) | INTRAMUSCULAR | Status: DC | PRN

## 2011-08-22 MED ORDER — BISACODYL 10 MG RE SUPP: 10.0000 mg | Freq: Every day | RECTAL | Status: DC | PRN

## 2011-08-22 MED ORDER — METOCLOPRAMIDE HCL 5 MG/ML IJ SOLN: 5.0000 mg | Freq: Three times a day (TID) | INTRAMUSCULAR | Status: DC | PRN

## 2011-08-22 MED ORDER — TRAMADOL HCL 50 MG PO TABS
50.0000 mg | ORAL_TABLET | Freq: Four times a day (QID) | ORAL | Status: DC | PRN
  Filled 2011-08-22: qty 2

## 2011-08-22 MED ORDER — BUPIVACAINE 0.25 % ON-Q PUMP SINGLE CATH 300ML: 300.0000 mL | INJECTION | Status: DC

## 2011-08-22 MED ORDER — CEFAZOLIN SODIUM 1-5 GM-% IV SOLN
INTRAVENOUS | Status: AC
  Filled 2011-08-22: qty 50

## 2011-08-22 MED ORDER — TEMAZEPAM 15 MG PO CAPS: 15.0000 mg | ORAL_CAPSULE | Freq: Every evening | ORAL | Status: DC | PRN

## 2011-08-22 MED ORDER — STERILE WATER FOR IRRIGATION IR SOLN
Status: DC | PRN
  Administered 2011-08-22: 1500 mL

## 2011-08-22 MED ORDER — POLYETHYLENE GLYCOL 3350 17 G PO PACK: 17.0000 g | PACK | Freq: Every day | ORAL | Status: DC | PRN

## 2011-08-22 MED ORDER — PROPOFOL 10 MG/ML IV EMUL
INTRAVENOUS | Status: DC | PRN
  Administered 2011-08-22: 30 mg via INTRAVENOUS

## 2011-08-22 MED ORDER — ACETAMINOPHEN 10 MG/ML IV SOLN
1000.0000 mg | Freq: Once | INTRAVENOUS | Status: AC
  Administered 2011-08-22: 1000 mg via INTRAVENOUS

## 2011-08-22 MED ORDER — DOCUSATE SODIUM 100 MG PO CAPS
100.0000 mg | ORAL_CAPSULE | Freq: Two times a day (BID) | ORAL | Status: DC
  Administered 2011-08-22 – 2011-08-25 (×6): 100 mg via ORAL
  Filled 2011-08-22 (×6): qty 1

## 2011-08-22 MED ORDER — DEXTROSE-NACL 5-0.9 % IV SOLN
INTRAVENOUS | Status: DC
  Administered 2011-08-22 (×2): via INTRAVENOUS
  Administered 2011-08-23: 20 mL/h via INTRAVENOUS

## 2011-08-22 MED ORDER — MORPHINE SULFATE 2 MG/ML IJ SOLN
INTRAMUSCULAR | Status: AC
  Filled 2011-08-22: qty 1

## 2011-08-22 MED ORDER — METHOCARBAMOL 500 MG PO TABS
500.0000 mg | ORAL_TABLET | Freq: Four times a day (QID) | ORAL | Status: DC | PRN
  Administered 2011-08-23 – 2011-08-25 (×6): 500 mg via ORAL
  Filled 2011-08-22 (×6): qty 1

## 2011-08-22 MED ORDER — BISOPROLOL FUMARATE 5 MG PO TABS
2.5000 mg | ORAL_TABLET | Freq: Once | ORAL | Status: AC
  Administered 2011-08-22: 2.5 mg via ORAL
  Filled 2011-08-22: qty 0.5

## 2011-08-22 SURGICAL SUPPLY — 53 items
BAG SPEC THK2 15X12 ZIP CLS (MISCELLANEOUS) ×1
BAG ZIPLOCK 12X15 (MISCELLANEOUS) ×2 IMPLANT
BANDAGE ELASTIC 6 VELCRO ST LF (GAUZE/BANDAGES/DRESSINGS) ×2 IMPLANT
BANDAGE ESMARK 6X9 LF (GAUZE/BANDAGES/DRESSINGS) ×1 IMPLANT
BLADE SAG 18X100X1.27 (BLADE) ×2 IMPLANT
BLADE SAW SGTL 11.0X1.19X90.0M (BLADE) ×2 IMPLANT
BNDG CMPR 9X6 STRL LF SNTH (GAUZE/BANDAGES/DRESSINGS) ×1
BNDG ESMARK 6X9 LF (GAUZE/BANDAGES/DRESSINGS) ×2
BOWL SMART MIX CTS (DISPOSABLE) ×2 IMPLANT
CATH KIT ON-Q SILVERSOAK 5 (CATHETERS) ×1 IMPLANT
CATH KIT ON-Q SILVERSOAK 5IN (CATHETERS) ×2 IMPLANT
CEMENT HV SMART SET (Cement) ×4 IMPLANT
CLOTH BEACON ORANGE TIMEOUT ST (SAFETY) ×2 IMPLANT
CUFF TOURN SGL QUICK 34 (TOURNIQUET CUFF) ×2
CUFF TRNQT CYL 34X4X40X1 (TOURNIQUET CUFF) ×1 IMPLANT
DRAPE EXTREMITY T 121X128X90 (DRAPE) ×2 IMPLANT
DRAPE POUCH INSTRU U-SHP 10X18 (DRAPES) ×2 IMPLANT
DRAPE U-SHAPE 47X51 STRL (DRAPES) ×2 IMPLANT
DRSG ADAPTIC 3X8 NADH LF (GAUZE/BANDAGES/DRESSINGS) ×2 IMPLANT
DRSG PAD ABDOMINAL 8X10 ST (GAUZE/BANDAGES/DRESSINGS) ×1 IMPLANT
DURAPREP 26ML APPLICATOR (WOUND CARE) ×2 IMPLANT
ELECT REM PT RETURN 9FT ADLT (ELECTROSURGICAL) ×2
ELECTRODE REM PT RTRN 9FT ADLT (ELECTROSURGICAL) ×1 IMPLANT
EVACUATOR 1/8 PVC DRAIN (DRAIN) ×2 IMPLANT
FACESHIELD LNG OPTICON STERILE (SAFETY) ×10 IMPLANT
GLOVE BIO SURGEON STRL SZ7.5 (GLOVE) ×2 IMPLANT
GLOVE BIO SURGEON STRL SZ8 (GLOVE) ×2 IMPLANT
GLOVE BIOGEL PI IND STRL 8 (GLOVE) ×2 IMPLANT
GLOVE BIOGEL PI INDICATOR 8 (GLOVE) ×2
GOWN STRL NON-REIN LRG LVL3 (GOWN DISPOSABLE) ×2 IMPLANT
GOWN STRL REIN XL XLG (GOWN DISPOSABLE) ×2 IMPLANT
HANDPIECE INTERPULSE COAX TIP (DISPOSABLE) ×2
IMMOBILIZER KNEE 20 (SOFTGOODS) ×2
IMMOBILIZER KNEE 20 THIGH 36 (SOFTGOODS) ×1 IMPLANT
KIT BASIN OR (CUSTOM PROCEDURE TRAY) ×2 IMPLANT
MANIFOLD NEPTUNE II (INSTRUMENTS) ×2 IMPLANT
NS IRRIG 1000ML POUR BTL (IV SOLUTION) ×2 IMPLANT
PACK TOTAL JOINT (CUSTOM PROCEDURE TRAY) ×2 IMPLANT
PAD ABD 7.5X8 STRL (GAUZE/BANDAGES/DRESSINGS) ×2 IMPLANT
PADDING CAST COTTON 6X4 STRL (CAST SUPPLIES) ×4 IMPLANT
POSITIONER SURGICAL ARM (MISCELLANEOUS) ×2 IMPLANT
SET HNDPC FAN SPRY TIP SCT (DISPOSABLE) ×1 IMPLANT
SPONGE GAUZE 4X4 12PLY (GAUZE/BANDAGES/DRESSINGS) ×2 IMPLANT
STRIP CLOSURE SKIN 1/2X4 (GAUZE/BANDAGES/DRESSINGS) ×4 IMPLANT
SUCTION FRAZIER 12FR DISP (SUCTIONS) ×2 IMPLANT
SUT MNCRL AB 4-0 PS2 18 (SUTURE) ×2 IMPLANT
SUT PDS AB 1 CT1 27 (SUTURE) ×3 IMPLANT
SUT VIC AB 2-0 CT1 27 (SUTURE) ×6
SUT VIC AB 2-0 CT1 TAPERPNT 27 (SUTURE) ×3 IMPLANT
TOWEL OR 17X26 10 PK STRL BLUE (TOWEL DISPOSABLE) ×4 IMPLANT
TRAY FOLEY CATH 14FRSI W/METER (CATHETERS) ×2 IMPLANT
WATER STERILE IRR 1500ML POUR (IV SOLUTION) ×2 IMPLANT
WRAP KNEE MAXI GEL POST OP (GAUZE/BANDAGES/DRESSINGS) ×4 IMPLANT

## 2011-08-22 NOTE — Anesthesia Postprocedure Evaluation (Signed)
  Anesthesia Post-op Note  Patient: Paul Lam  Procedure(s) Performed: Procedure(s) (LRB): TOTAL KNEE ARTHROPLASTY (Right)  Patient Location: PACU  Anesthesia Type: Spinal  Level of Consciousness: awake and alert   Airway and Oxygen Therapy: Patient Spontanous Breathing  Post-op Pain: mild  Post-op Assessment: Post-op Vital signs reviewed, Patient's Cardiovascular Status Stable, Respiratory Function Stable, Patent Airway and No signs of Nausea or vomiting  Post-op Vital Signs: stable  Complications: No apparent anesthesia complications

## 2011-08-22 NOTE — Progress Notes (Signed)
[employer] called to inspect CPAP machine.

## 2011-08-22 NOTE — Progress Notes (Signed)
Utilization review completed.  

## 2011-08-22 NOTE — Op Note (Signed)
Pre-operative diagnosis- Osteoarthritis  Right knee(s)  Post-operative diagnosis- Osteoarthritis Right knee(s)  Procedure-  Right  Total Knee Arthroplasty  Surgeon- Gus Rankin. Lamyah Creed, MD  Assistant- Dimitri Ped, PA-C   Anesthesia-  Spinal EBL-* No blood loss amount entered *  Drains Hemovac  Tourniquet time-  Total Tourniquet Time Documented: Thigh (Right) - 42 minutes   Complications- None  Condition-PACU - hemodynamically stable.   Brief Clinical Note  Paul Lam is a 71 y.o. year old male with end stage OA of his right knee with progressively worsening pain and dysfunction. He has constant pain, with activity and at rest and significant functional deficits with difficulties even with ADLs. He has had extensive non-op management including analgesics, injections of cortisone and home exercise program, but remains in significant pain with significant dysfunction. Radiographs show bone on bone arthritis medial compartment with varus deformity and tibial subluxation. He presents now for right Total Knee Arthroplasty.    Procedure in detail---   The patient is brought into the operating room and positioned supine on the operating table. After successful administration of  Spinal,   a tourniquet is placed high on the  Right thigh(s) and the lower extremity is prepped and draped in the usual sterile fashion. Time out is performed by the operating team and then the  Right lower extremity is wrapped in Esmarch, knee flexed and the tourniquet inflated to 300 mmHg.       A midline incision is made with a ten blade through the subcutaneous tissue to the level of the extensor mechanism. A fresh blade is used to make a medial parapatellar arthrotomy. Soft tissue over the proximal medial tibia is subperiosteally elevated to the joint line with a knife and into the semimembranosus bursa with a Cobb elevator. Soft tissue over the proximal lateral tibia is elevated with attention being paid to  avoiding the patellar tendon on the tibial tubercle. The patella is everted, knee flexed 90 degrees and the ACL and PCL are removed. Findings are bone on bone medial and patellofemoral with large medial osteophytes.        The drill is used to create a starting hole in the distal femur and the canal is thoroughly irrigated with sterile saline to remove the fatty contents. The 5 degree Right  valgus alignment guide is placed into the femoral canal and the distal femoral cutting block is pinned to remove 11 mm off the distal femur. Resection is made with an oscillating saw.      The tibia is subluxed forward and the menisci are removed. The extramedullary alignment guide is placed referencing proximally at the medial aspect of the tibial tubercle and distally along the second metatarsal axis and tibial crest. The block is pinned to remove 2mm off the more deficient medial  side. Resection is made with an oscillating saw. Size 4is the most appropriate size for the tibia and the proximal tibia is prepared with the modular drill and keel punch for that size.      The femoral sizing guide is placed and size 5 is most appropriate. Rotation is marked off the epicondylar axis and confirmed by creating a rectangular flexion gap at 90 degrees. The size 5 cutting block is pinned in this rotation and the anterior, posterior and chamfer cuts are made with the oscillating saw. The intercondylar block is then placed and that cut is made.      Trial size 4 tibial component, trial size 5 posterior stabilized femur and  a 15  mm posterior stabilized rotating platform insert trial is placed. Full extension is achieved with excellent varus/valgus and anterior/posterior balance throughout full range of motion. The patella is everted and thickness measured to be 26  mm. Free hand resection is taken to 15 mm, a 38 template is placed, lug holes are drilled, trial patella is placed, and it tracks normally. Osteophytes are removed off the  posterior femur with the trial in place. All trials are removed and the cut bone surfaces prepared with pulsatile lavage. Cement is mixed and once ready for implantation, the size 4 tibial implant, size  5 posterior stabilized femoral component, and the size 38 patella are cemented in place and the patella is held with the clamp. The trial insert is placed and the knee held in full extension. All extruded cement is removed and once the cement is hard the permanent 15 mm posterior stabilized rotating platform insert is placed into the tibial tray.      The wound is copiously irrigated with saline solution and the extensor mechanism closed over a hemovac drain with #1 PDS suture. The tourniquet is released for a total tourniquet time of 42  minutes. Flexion against gravity is 135 degrees and the patella tracks normally. Subcutaneous tissue is closed with 2.0 vicryl and subcuticular with running 4.0 Monocryl. The catheter for the Marcaine pain pump is placed and the pump is initiated. The incision is cleaned and dried and steri-strips and a bulky sterile dressing are applied. The limb is placed into a knee immobilizer and the patient is awakened and transported to recovery in stable condition.      Please note that a surgical assistant was a medical necessity for this procedure in order to perform it in a safe and expeditious manner. Surgical assistant was necessary to retract the ligaments and vital neurovascular structures to prevent injury to them and also necessary for proper positioning of the limb to allow for anatomic placement of the prosthesis.   Gus Rankin Sierah Lacewell, MD    08/22/2011, 8:43 AM

## 2011-08-22 NOTE — Anesthesia Procedure Notes (Signed)
Spinal  Patient location during procedure: OR Staffing Performed by: anesthesiologist  Preanesthetic Checklist Completed: patient identified, site marked, surgical consent, pre-op evaluation, timeout performed, IV checked, risks and benefits discussed and monitors and equipment checked Spinal Block Patient position: sitting Prep: Betadine Patient monitoring: heart rate, continuous pulse ox and blood pressure Injection technique: single-shot Needle Needle type: Spinocan  Needle gauge: 22 G Needle length: 9 cm Assessment Sensory level: T8 Additional Notes Expiration date of kit checked and confirmed. Patient tolerated procedure well, without complications.     

## 2011-08-22 NOTE — Progress Notes (Signed)
Pt home CPAP set-up for pt to use tonight.  Pt states he will put himself on his home CPAP when he is ready tonight.  Pt using home settings with humidification.  RT encouraged pt to notify RN/ RT of any needs or concerns.

## 2011-08-22 NOTE — Transfer of Care (Signed)
Immediate Anesthesia Transfer of Care Note  Patient: Paul Lam  Procedure(s) Performed: Procedure(s) (LRB): TOTAL KNEE ARTHROPLASTY (Right)  Patient Location: PACU  Anesthesia Type: Regional  Level of Consciousness: awake, alert  and patient cooperative  Airway & Oxygen Therapy: Patient Spontanous Breathing and Patient connected to face mask oxygen  Post-op Assessment: Report given to PACU RN and Post -op Vital signs reviewed and stable  Post vital signs: Reviewed and stable  Complications: No apparent anesthesia complications

## 2011-08-22 NOTE — Interval H&P Note (Signed)
History and Physical Interval Note:  08/22/2011 6:48 AM  Paul Lam  has presented today for surgery, with the diagnosis of Osteoarthritis of the Right Knee  The various methods of treatment have been discussed with the patient and family. After consideration of risks, benefits and other options for treatment, the patient has consented to  Procedure(s) (LRB): TOTAL KNEE ARTHROPLASTY (Right) as a surgical intervention .  The patients' history has been reviewed, patient examined, no change in status, stable for surgery.  I have reviewed the patients' chart and labs.  Questions were answered to the patient's satisfaction.     Loanne Drilling

## 2011-08-22 NOTE — Anesthesia Preprocedure Evaluation (Signed)
Anesthesia Evaluation  Patient identified by MRN, date of birth, ID band Patient awake    Reviewed: Allergy & Precautions, H&P , NPO status , Patient's Chart, lab work & pertinent test results  Airway Mallampati: II TM Distance: >3 FB Neck ROM: Full    Dental No notable dental hx.    Pulmonary neg pulmonary ROS,  breath sounds clear to auscultation  Pulmonary exam normal       Cardiovascular hypertension, Rhythm:Regular Rate:Normal     Neuro/Psych negative neurological ROS  negative psych ROS   GI/Hepatic negative GI ROS, Neg liver ROS,   Endo/Other  negative endocrine ROS  Renal/GU negative Renal ROS  negative genitourinary   Musculoskeletal negative musculoskeletal ROS (+)   Abdominal   Peds negative pediatric ROS (+)  Hematology negative hematology ROS (+)   Anesthesia Other Findings   Reproductive/Obstetrics negative OB ROS                           Anesthesia Physical Anesthesia Plan  ASA: II  Anesthesia Plan: Spinal   Post-op Pain Management:    Induction: Intravenous  Airway Management Planned: Simple Face Mask  Additional Equipment:   Intra-op Plan:   Post-operative Plan:   Informed Consent: I have reviewed the patients History and Physical, chart, labs and discussed the procedure including the risks, benefits and alternatives for the proposed anesthesia with the patient or authorized representative who has indicated his/her understanding and acceptance.   Dental advisory given  Plan Discussed with: CRNA  Anesthesia Plan Comments:         Anesthesia Quick Evaluation

## 2011-08-23 DIAGNOSIS — E871 Hypo-osmolality and hyponatremia: Secondary | ICD-10-CM | POA: Diagnosis not present

## 2011-08-23 LAB — BASIC METABOLIC PANEL
BUN: 13 mg/dL (ref 6–23)
CO2: 27 mEq/L (ref 19–32)
Calcium: 8 mg/dL — ABNORMAL LOW (ref 8.4–10.5)
Creatinine, Ser: 0.81 mg/dL (ref 0.50–1.35)
Glucose, Bld: 121 mg/dL — ABNORMAL HIGH (ref 70–99)

## 2011-08-23 LAB — CBC
HCT: 35.4 % — ABNORMAL LOW (ref 39.0–52.0)
Hemoglobin: 11.8 g/dL — ABNORMAL LOW (ref 13.0–17.0)
MCH: 28.7 pg (ref 26.0–34.0)
MCHC: 33.3 g/dL (ref 30.0–36.0)
MCV: 86.1 fL (ref 78.0–100.0)
RBC: 4.11 MIL/uL — ABNORMAL LOW (ref 4.22–5.81)

## 2011-08-23 NOTE — Progress Notes (Signed)
Physical Therapy Treatment Patient Details Name: Paul Lam MRN: [id number] DOB: 05/10/1941 Today's Date: 08/23/2011  PT Assessment/Plan  PT - Assessment/Plan Comments on Treatment Session: Improvement in activity and WB tolerance PT Plan: Discharge plan remains appropriate PT Frequency: 7X/week Recommendations for Other Services: OT consult Follow Up Recommendations: Home health PT;Skilled nursing facility Equipment Recommended: None recommended by PT PT Goals  Acute Rehab PT Goals PT Goal Formulation: With patient Time For Goal Achievement: 7 days Pt will go Supine/Side to Sit: with supervision PT Goal: Supine/Side to Sit - Progress: Goal set today Pt will go Sit to Supine/Side: with supervision PT Goal: Sit to Supine/Side - Progress: Goal set today Pt will go Sit to Stand: with supervision PT Goal: Sit to Stand - Progress: Goal set today Pt will go Stand to Sit: with supervision PT Goal: Stand to Sit - Progress: Goal set today Pt will Ambulate: 51 - 150 feet;with supervision;with rolling walker PT Goal: Ambulate - Progress: Progressing toward goal  PT Treatment Precautions/Restrictions  Precautions Precautions: Knee Required Braces or Orthoses: Yes Knee Immobilizer: Discontinue once straight leg raise with < 10 degree lag Restrictions Weight Bearing Restrictions: No Other Position/Activity Restrictions: wbat Mobility (including Balance) Bed Mobility Sit to Supine: 1: +2 Total assist Sit to Supine - Details (indicate cue type and reason): pt 50% Transfers Sit to Stand: 1: +2 Total assist (pt 60%) Sit to Stand Details (indicate cue type and reason): cues for use of UEs and for LE managment Stand to Sit: 1: +2 Total assist (pt 70%) Stand to Sit Details: cues for use of UEs and for LE managment Ambulation/Gait Ambulation/Gait Assistance: 1: +2 Total assist Ambulation/Gait Assistance Details (indicate cue type and reason): cues for posture, sequence, position from RW,  increased WB UEs, and stride length Ambulation Distance (Feet): 37 Feet Assistive device: Rolling walker Gait Pattern: Decreased stance time - right;Step-to pattern    Exercise    End of Session PT - End of Session Equipment Utilized During Treatment: Gait belt;Right knee immobilizer Activity Tolerance: Patient limited by pain Patient left: in bed;with call bell in reach;with family/visitor present Nurse Communication: Mobility status for transfers;Mobility status for ambulation General Behavior During Session: [facility] for tasks performed Cognition: [facility] for tasks performed  Paul Lam 08/23/2011, 3:48 PM

## 2011-08-23 NOTE — Progress Notes (Signed)
RT found pt already placed on his home CPAP for the night.  Pt is tolerating well.  Pt encouraged to notify RN/ RT of any concerns.

## 2011-08-23 NOTE — Progress Notes (Signed)
CSW met with pt this afternoon to assist with d/c planning. PT notes that pt may benefit from ST SNF placement following hospitalization. Pt is agreeable with this plan . [place]. Pt would like to go to [facility] if SNF is required. CSW will follow to assist with d/c planning. FL2 and 30 DAY NOTE in shadow chart for MD signature.    Cori Razor LCSW  [phone]

## 2011-08-23 NOTE — Progress Notes (Signed)
Subjective: 1 Day Post-Op Procedure(s) (LRB): TOTAL KNEE ARTHROPLASTY (Right) Patient reports pain as mild.  Not much sleep. Patient seen in rounds with Dr. Lequita Halt. We will start therapy today. Plan is to go home with wife after hospital stay.  Objective: Vital signs in last 24 hours: Temp:  [97.5 F (36.4 C)-98.3 F (36.8 C)] 98 F (36.7 C) (03/19 0545) Pulse Rate:  [54-80] 67  (03/19 0545) Resp:  [12-20] 18  (03/19 0545) BP: (104-147)/(48-79) 145/79 mmHg (03/19 0545) SpO2:  [90 %-100 %] 98 % (03/19 0545) Weight:  [127.688 kg (281 lb 8 oz)] 127.688 kg (281 lb 8 oz) (03/18 1443)  Intake/Output from previous day:  Intake/Output Summary (Last 24 hours) at 08/23/11 0846 Last data filed at 08/23/11 0625  Gross per 24 hour  Intake 3931.33 ml  Output   1725 ml  Net 2206.33 ml    Intake/Output this shift: 400 UOP since MN  Labs:  [facility] 08/23/11 0445  HGB 11.8*    Basename 08/23/11 0445  WBC 7.9  RBC 4.11*  HCT 35.4*  PLT 130*    Basename 08/23/11 0445  NA 135  K 4.3  CL 101  CO2 27  BUN 13  CREATININE 0.81  GLUCOSE 121*  CALCIUM 8.0*   No results found for this basename: LABPT:2,INR:2 in the last 72 hours  Exam - Neurovascular intact Sensation intact distally Dressing - clean, dry, no drainage Motor function intact - moving foot and toes well on exam.  Hemovac pulled without difficulty.  Past Medical History  Diagnosis Date  . Hypertension   . Sleep apnea     CPAP machine diagnosed 8 yrs ago   . Shortness of breath     related to weight   . Chronic kidney disease     hx of kidney stones   . Cancer     skin cancer removed left ear 2-3 weeks ago   . Arthritis     generalized arthritis   . Anxiety     Assessment/Plan: 1 Day Post-Op Procedure(s) (LRB): TOTAL KNEE ARTHROPLASTY (Right) Principal Problem:  *OA (osteoarthritis) of knee Active Problems:  Postop Hyponatremia   Advance diet Up with therapy Continue foley due to strict I&O and  urinary output monitoring Discharge home with home health  DVT Prophylaxis - Xarelto  Protocol Weight-Bearing as tolerated to right leg Keep foley until tomorrow. No vaccines. D/C O2 and Pulse OX and try on Room [address]  Patrica Duel 08/23/2011, 8:46 AM

## 2011-08-23 NOTE — Clinical Documentation Improvement (Signed)
     BMI DOCUMENTATION CLARIFICATION QUERY  THIS DOCUMENT IS NOT A PERMANENT PART OF THE MEDICAL RECORD  TO RESPOND TO THE THIS QUERY, FOLLOW THE INSTRUCTIONS BELOW:  1. If needed, update documentation for the patient's encounter via the notes activity.  2. Access this query again and click edit on the In [employer].  3. After updating, or not, click F2 to complete all highlighted (required) fields concerning your review. Select "additional documentation in the medical record" OR "no additional documentation provided".  4. Click Sign note button.  5. The deficiency will fall out of your In Basket *Please let us know if you are not able to complete this workflow by phone or e-mail (listed below).         08/23/11  Dear Dr. Lequita Halt, Carmon Ginsberg [place]on [place]  In an effort to better capture your patient's severity of illness, reflect appropriate length of stay and utilization of resources, a review of the patient medical record has revealed the following indicators.    Based on your clinical judgment, please clarify and document in a progress note and/or discharge summary the clinical condition associated with the following supporting information:  In responding to this query please exercise your independent judgment.  The fact that a query is asked, does not imply that any particular answer is desired or expected.  Pt's BMI=   42.9. In setting of R TKA   Please clarify whether or not BMI can be linked to one of he diagnoses listed below and document in pn  and d/c. Thank You!  [facility]: When linking BMI to a diagnosis please document both BMI and diagnosis together in pn for accuracy of SOI and ROM.     Possible Clinical conditions  Morbid Obesity W/ BMI=   Underweight w/BMI=  Other condition___________________  Cannot Clinically determine _____________  Risk Factors: Sign & Symptoms: BMI42.9 5'8"/281lbs     Treatment monitoring  Reviewed:  no additional documentation  provided ljh  Thank You,  Enis Slipper  RN, BSN, CCDS Clinical Documentation Specialist [facility] Dept Pager: [phone] / E-mail: [email or web address]  Health Information Management [place]

## 2011-08-23 NOTE — Evaluation (Signed)
Physical Therapy Evaluation Patient Details Name: Paul Lam MRN: [id number] DOB: [birth year] Today's Date: 08/23/2011  Problem List:  Patient Active Problem List  Diagnoses  . OA (osteoarthritis) of knee  . Postop Hyponatremia    Past Medical History:  Past Medical History  Diagnosis Date  . Hypertension   . Sleep apnea     CPAP machine diagnosed 8 yrs ago   . Shortness of breath     related to weight   . Chronic kidney disease     hx of kidney stones   . Cancer     skin cancer removed left ear 2-3 weeks ago   . Arthritis     generalized arthritis   . Anxiety    Past Surgical History:  Past Surgical History  Procedure Date  . Other surgical history     left ear skin ca removed  2 weeks ago - not completeely healed 08/15/11   . Eye surgery     detached retina on right eye 11/12   . Appendectomy   . Tonsillectomy     PT Assessment/Plan/Recommendation PT Assessment Clinical Impression Statement: Pt with R TKR presents with decreased R LE strength/ROM, min WB tolerance on R LE, premorbid obesity, and ltd functional mobiltiy PT Recommendation/Assessment: Patient will need skilled PT in the acute care venue PT Problem List: Decreased strength;Decreased activity tolerance;Decreased range of motion;Decreased mobility;Decreased knowledge of use of DME;Pain PT Therapy Diagnosis : Difficulty walking PT Plan PT Frequency: 7X/week PT Treatment/Interventions: DME instruction;Gait training;Stair training;Functional mobility training;Therapeutic activities;Therapeutic exercise;Patient/family education PT Recommendation Recommendations for Other Services: OT consult Follow Up Recommendations: Home health PT;Skilled nursing facility Equipment Recommended: None recommended by PT (dependent on acute stay progress) PT Goals  Acute Rehab PT Goals PT Goal Formulation: With patient Time For Goal Achievement: 7 days Pt will go Supine/Side to Sit: with supervision PT Goal:  Supine/Side to Sit - Progress: Goal set today Pt will go Sit to Supine/Side: with supervision PT Goal: Sit to Supine/Side - Progress: Goal set today Pt will go Sit to Stand: with supervision PT Goal: Sit to Stand - Progress: Goal set today Pt will go Stand to Sit: with supervision PT Goal: Stand to Sit - Progress: Goal set today Pt will Ambulate: 51 - 150 feet;with supervision;with rolling walker PT Goal: Ambulate - Progress: Goal set today  PT Evaluation Precautions/Restrictions  Precautions Precautions: Knee Required Braces or Orthoses: Yes Knee Immobilizer: Discontinue once straight leg raise with < 10 degree lag Restrictions Weight Bearing Restrictions: No Other Position/Activity Restrictions: wbat Prior Functioning  Home Living Lives With: Spouse Receives Help From: Family Type of Home: House Home Layout: One level Home Access: Stairs to enter;Ramped entrance Entrance Stairs-Number of Steps: 3 Home Adaptive Equipment: Walker - rolling Prior Function Level of Independence: Independent with basic ADLs;Independent with gait;Independent with transfers Able to Take Stairs?: Yes Cognition Cognition Arousal/Alertness: Awake/alert Overall Cognitive Status: Appears within functional limits for tasks assessed Orientation Level: Oriented X4 Sensation/Coordination Coordination Gross Motor Movements are Fluid and Coordinated: Yes Extremity Assessment RUE Assessment RUE Assessment: Within Functional Limits LUE Assessment LUE Assessment: Within Functional Limits RLE Assessment RLE Assessment: Exceptions to WFL (2/5 quads; -10 - 50 AAROM at knee) LLE Assessment LLE Assessment: Within Functional Limits Mobility (including Balance) Bed Mobility Bed Mobility: Yes Supine to Sit: 1: +2 Total assist (pt 60%) Supine to Sit Details (indicate cue type and reason): cues for sequence and for self assist with L LE and UEs Transfers Transfers: Yes Sit to  Stand: 1: +2 Total assist (pt  60%) Sit to Stand Details (indicate cue type and reason): cues for use of UEs and for LE position Stand to Sit: 1: +2 Total assist (pt 60%) Stand to Sit Details: cues for use of UEs and for LE position Ambulation/Gait Ambulation/Gait: Yes Ambulation/Gait Assistance: 1: +2 Total assist (pt 60%) Ambulation/Gait Assistance Details (indicate cue type and reason): cues for sequence, posture, position from RW, and increased WB on UEs.  Pt tolerating min WB on R LE  Ambulation Distance (Feet): 4 Feet Assistive device: Rolling walker Gait Pattern: Decreased stance time - right;Step-to pattern    Exercise  Total Joint Exercises Ankle Circles/Pumps: AROM;10 reps;Both;Supine Quad Sets: AROM;10 reps;Left;Supine Heel Slides: AAROM;10 reps;Right;Supine Straight Leg Raises: AAROM;Both;10 reps;Supine End of Session PT - End of Session Equipment Utilized During Treatment: Gait belt;Right knee immobilizer Activity Tolerance: Patient limited by pain Patient left: in chair;with call bell in reach Nurse Communication: Mobility status for transfers;Mobility status for ambulation General Behavior During Session: [facility] for tasks performed Cognition: [facility] for tasks performed  Lansing Sigmon 08/23/2011, 12:46 PM

## 2011-08-24 LAB — BASIC METABOLIC PANEL
CO2: 28 mEq/L (ref 19–32)
Calcium: 8.3 mg/dL — ABNORMAL LOW (ref 8.4–10.5)
Creatinine, Ser: 0.76 mg/dL (ref 0.50–1.35)
GFR calc non Af Amer: >90 mL/min (ref >90)

## 2011-08-24 LAB — CBC
MCH: 28.5 pg (ref 26.0–34.0)
MCHC: 33.1 g/dL (ref 30.0–36.0)
MCV: 86.2 fL (ref 78.0–100.0)
Platelets: 132 10*3/uL — ABNORMAL LOW (ref 150–400)
RBC: 4.14 MIL/uL — ABNORMAL LOW (ref 4.22–5.81)
RDW: 13.3 % (ref 11.5–15.5)

## 2011-08-24 NOTE — Progress Notes (Signed)
Physical Therapy Treatment Patient Details Name: Paul Lam MRN: [id number] DOB: August 19, 1940 Today's Date: 08/24/2011  PT Assessment/Plan  PT - Assessment/Plan Comments on Treatment Session: Improvement in activity and WB tolerance PT Plan: Frequency needs to be updated PT Frequency: 7X/week Recommendations for Other Services: OT consult Follow Up Recommendations: Skilled nursing facility Equipment Recommended: Defer to next venue PT Goals  Acute Rehab PT Goals PT Goal Formulation: With patient Time For Goal Achievement: 7 days Pt will go Supine/Side to Sit: with supervision PT Goal: Supine/Side to Sit - Progress: Progressing toward goal Pt will go Sit to Supine/Side: with supervision PT Goal: Sit to Supine/Side - Progress: Progressing toward goal Pt will go Sit to Stand: with supervision PT Goal: Sit to Stand - Progress: Progressing toward goal Pt will go Stand to Sit: with supervision PT Goal: Stand to Sit - Progress: Progressing toward goal Pt will Ambulate: 51 - 150 feet;with supervision;with rolling walker PT Goal: Ambulate - Progress: Progressing toward goal  PT Treatment Precautions/Restrictions  Precautions Precautions: Knee Required Braces or Orthoses: Yes Knee Immobilizer: Discontinue once straight leg raise with < 10 degree lag Restrictions Weight Bearing Restrictions: No Other Position/Activity Restrictions: wbat Mobility (including Balance) Bed Mobility Supine to Sit: 3: Mod assist Supine to Sit Details (indicate cue type and reason): cues for sequence and use of UEs to self assist Sit to Supine: 3: Mod assist Sit to Supine - Details (indicate cue type and reason): cues for sequence and use of UEs to self assist Transfers Sit to Stand: 1: +2 Total assist;From bed;With upper extremity assist Sit to Stand Details (indicate cue type and reason): cues for use of UEs and for LE managment - pt 70% from elevated surface Stand to Sit: 1: +2 Total assist;To  bed;With upper extremity assist Stand to Sit Details: cues for use of UEs and for LE management - pt 60% Ambulation/Gait Ambulation/Gait Assistance: 3: Mod assist Ambulation/Gait Assistance Details (indicate cue type and reason): cues for sequence, posture, stride length and position from RW Ambulation Distance (Feet): 138 Feet Assistive device: Rolling walker Gait Pattern: Step-to pattern    Exercise    End of Session PT - End of Session Equipment Utilized During Treatment: Gait belt;Right knee immobilizer Activity Tolerance: Patient tolerated treatment well Patient left: in bed;with call bell in reach;with family/visitor present Nurse Communication: Mobility status for transfers;Mobility status for ambulation General Behavior During Session: [facility] for tasks performed Cognition: [facility] for tasks performed  Paul Lam 08/24/2011, 4:19 PM

## 2011-08-24 NOTE — Progress Notes (Signed)
CSW assisting with d/c planning. Pt has SNF bed available at [facility] Thurs if ready for d/c. CSW will follow to assist with d/c planning to SNF.  Cori Razor  LCSW  [phone]

## 2011-08-24 NOTE — Progress Notes (Signed)
Subjective: 2 Days Post-Op Procedure(s) (LRB): TOTAL KNEE ARTHROPLASTY (Right) Patient reports pain as mild.   Patient has complaints of not much sleep.  Pain is better.  Looking into [facility] for SNF.  Objective: Vital signs in last 24 hours: Temp:  [98.5 F (36.9 C)-99.4 F (37.4 C)] 98.5 F (36.9 C) (03/20 1400) Pulse Rate:  [68-88] 88  (03/20 1400) Resp:  [16-18] 18  (03/20 1400) BP: (134-151)/(70-79) 144/70 mmHg (03/20 1400) SpO2:  [95 %-96 %] 95 % (03/20 1400)  Intake/Output from previous day:  Intake/Output Summary (Last 24 hours) at 08/24/11 1511 Last data filed at 08/24/11 1438  Gross per 24 hour  Intake 1152.67 ml  Output   2575 ml  Net -1422.33 ml    Intake/Output this shift: Total I/O In: 407 [P.O.:240; I.V.:167] Out: 700 [Urine:700]  Labs:  [facility] 08/24/11 0454 08/23/11 0445  HGB 11.8* 11.8*    Basename 08/24/11 0454 08/23/11 0445  WBC 9.5 7.9  RBC 4.14* 4.11*  HCT 35.7* 35.4*  PLT 132* 130*    Basename 08/24/11 0454 08/23/11 0445  NA 134* 135  K 4.1 4.3  CL 100 101  CO2 28 27  BUN 8 13  CREATININE 0.76 0.81  GLUCOSE 113* 121*  CALCIUM 8.3* 8.0*   No results found for this basename: LABPT:2,INR:2 in the last 72 hours  Exam - Neurovascular intact Sensation intact distally Dressing/Incision - clean, dry, no drainage Motor function intact - moving foot and toes well on exam.   Past Medical History  Diagnosis Date  . Hypertension   . Sleep apnea     CPAP machine diagnosed 8 yrs ago   . Shortness of breath     related to weight   . Chronic kidney disease     hx of kidney stones   . Cancer     skin cancer removed left ear 2-3 weeks ago   . Arthritis     generalized arthritis   . Anxiety     Assessment/Plan: 2 Days Post-Op Procedure(s) (LRB): TOTAL KNEE ARTHROPLASTY (Right) Principal Problem:  *OA (osteoarthritis) of knee Active Problems:  Postop Hyponatremia   Advance diet Up with therapy D/C IV fluids Plan for  discharge tomorrow Discharge to SNF  DVT Prophylaxis - Xarelto Protocol Weight-Bearing as tolerated to right leg  PERKINS, ALEXZANDREW 08/24/2011, 3:11 PM

## 2011-08-24 NOTE — Evaluation (Signed)
Occupational Therapy Evaluation Patient Details Name: Paul Lam MRN: [id number] DOB: Jul 24, 1940 Today's Date: 08/24/2011  Problem List:  Patient Active Problem List  Diagnoses  . OA (osteoarthritis) of knee  . Postop Hyponatremia    Past Medical History:  Past Medical History  Diagnosis Date  . Hypertension   . Sleep apnea     CPAP machine diagnosed 8 yrs ago   . Shortness of breath     related to weight   . Chronic kidney disease     hx of kidney stones   . Cancer     skin cancer removed left ear 2-3 weeks ago   . Arthritis     generalized arthritis   . Anxiety    Past Surgical History:  Past Surgical History  Procedure Date  . Other surgical history     left ear skin ca removed  2 weeks ago - not completeely healed 08/15/11   . Eye surgery     detached retina on right eye 11/12   . Appendectomy   . Tonsillectomy     OT Assessment/Plan/Recommendation OT Assessment Clinical Impression Statement: Pt presents with s/p R TKR and displays decreased strength and will benefit from skilled OT to increase his independence with ADL for next venue of care. OT Recommendation/Assessment: Patient will need skilled OT in the acute care venue OT Problem List: Decreased strength;Decreased knowledge of use of DME or AE;Pain OT Therapy Diagnosis : Generalized weakness;Acute pain OT Plan OT Frequency: Min 1X/week OT Treatment/Interventions: Self-care/ADL training;Therapeutic activities;DME and/or AE instruction;Patient/family education OT Recommendation Follow Up Recommendations: Skilled nursing facility Equipment Recommended: Defer to next venue Individuals Consulted Consulted and Agree with Results and Recommendations: Patient OT Goals Acute Rehab OT Goals OT Goal Formulation: With patient Time For Goal Achievement: 7 days ADL Goals Pt Will Perform Grooming: with min assist;Standing at sink ADL Goal: Grooming - Progress: Goal set today Pt Will Perform Lower Body Bathing:  with min assist;with adaptive equipment;Sit to stand from chair;Sit to stand from bed ADL Goal: Lower Body Bathing - Progress: Goal set today Pt Will Perform Lower Body Dressing: with min assist;with adaptive equipment;Sit to stand from bed;Sit to stand from chair ADL Goal: Lower Body Dressing - Progress: Goal set today Pt Will Transfer to Toilet: with min assist;with DME;Ambulation;3-in-1 ADL Goal: Toilet Transfer - Progress: Goal set today Pt Will Perform Toileting - Clothing Manipulation: with min assist;Standing ADL Goal: Toileting - Clothing Manipulation - Progress: Goal set today Pt Will Perform Toileting - Hygiene: with min assist;Standing at 3-in-1/toilet ADL Goal: Toileting - Hygiene - Progress: Goal set today  OT Evaluation Precautions/Restrictions  Precautions Precautions: Knee Required Braces or Orthoses: Yes Knee Immobilizer: Discontinue once straight leg raise with < 10 degree lag Restrictions Weight Bearing Restrictions: No Other Position/Activity Restrictions: wbat Prior Functioning Home Living Lives With: Spouse Receives Help From: Family Type of Home: House Home Layout: One level Home Access: Stairs to enter;Ramped entrance Entrance Stairs-Number of Steps: 3 Bathroom Shower/Tub: Tub/shower unit Home Adaptive Equipment: Environmental consultant - rolling;Sock aid Prior Function Level of Independence: Independent with basic ADLs;Independent with gait;Independent with transfers Comments: used sock aid and long shoe horn for dressing PTA for quite awhile per pt ADL ADL Eating/Feeding: Simulated;Independent Where Assessed - Eating/Feeding: Chair Grooming: Simulated;Set up Where Assessed - Grooming: Sitting, chair Upper Body Bathing: Simulated;Chest;Right arm;Left arm;Abdomen;Supervision/safety;Set up Where Assessed - Upper Body Bathing: Sitting, bed;Unsupported Lower Body Bathing: Simulated;+2 Total assistance;Comment for patient % Lower Body Bathing Details (indicate cue type  and reason): pt 40%.  2nd person for safety Where Assessed - Lower Body Bathing: Sit to stand from bed Upper Body Dressing: Simulated;Supervision/safety;Set up Where Assessed - Upper Body Dressing: Sitting, bed;Unsupported Lower Body Dressing: Simulated;+2 Total assistance;Comment for patient % Lower Body Dressing Details (indicate cue type and reason): pt 15% needs assist of RW currently to balance and unable to free from RW to simulate pull up clothing. Used sock aid PTA so unable to reach to feet also-requires assist to start clothing over feet/socks without AE. 2nd person for safety Where Assessed - Lower Body Dressing: Sit to stand from bed Toilet Transfer: Performed;+2 Total assistance;Comment for patient % Toilet Transfer Details (indicate cue type and reason): pt 60% 2nd person for safety and manage IV Toilet Transfer Method: Stand pivot;Other (comment) (stand pivot to [facility] then approximately 5 feet to chair. ) Toilet Transfer Equipment: Bedside commode Toileting - Clothing Manipulation: Simulated;+2 Total assistance;Comment for patient % Toileting - Clothing Manipulation Details (indicate cue type and reason): pt 0% pt currently not able to let go of RW to use UE with hygiene. Where Assessed - Toileting Clothing Manipulation: Standing Toileting - Hygiene: Simulated;+2 Total assistance;Comment for patient % Toileting - Hygiene Details (indicate cue type and reason): 0% currently unable to let go of RW safely to use UE with hygiene. Where Assessed - Toileting Hygiene: Standing Tub/Shower Transfer: Not assessed Tub/Shower Transfer Method: Not assessed Equipment Used: Rolling walker ADL Comments: Encourage purse lip breathing as pt started to breathe harder during transfers and also gets anxious. Pt needed verbal cues throughout for RW sequence and hand/LE placement. Vision/Perception  Vision - History Baseline Vision: Wears glasses all the time Cognition Cognition Arousal/Alertness:  Awake/alert Overall Cognitive Status: Appears within functional limits for tasks assessed Orientation Level: Oriented X4 Sensation/Coordination Sensation Light Touch: Appears Intact Extremity Assessment RUE Assessment RUE Assessment: Within Functional Limits LUE Assessment LUE Assessment: Within Functional Limits Mobility  Bed Mobility Bed Mobility: Yes Supine to Sit: 3: Mod assist;With rails;HOB elevated (Comment degrees) Supine to Sit Details (indicate cue type and reason): min verbal cues for technique Transfers Sit to Stand: 1: +2 Total assist;Patient percentage (comment);From bed;From chair/3-in-1;From elevated surface;With upper extremity assist Sit to Stand Details (indicate cue type and reason): 2nd person for safety. 70% from elevated bed and 60% from [facility]. verbal cues for technique Stand to Sit: 1: +2 Total assist;Patient percentage (comment);With upper extremity assist;To chair/3-in-1 Stand to Sit Details: pt 60% verbal cues for technique and assist with LE Exercises   End of Session OT - End of Session Equipment Utilized During Treatment: Gait belt Activity Tolerance: Patient tolerated treatment well Patient left: in chair;with call bell in reach General Behavior During Session: [facility] for tasks performed Cognition: [facility] for tasks performed   Lennox Laity [phone] 08/24/2011, 10:15 AM

## 2011-08-24 NOTE — Progress Notes (Signed)
Physical Therapy Treatment Patient Details Name: BRISTOL SOY MRN: [id number] DOB: 1940-09-01 Today's Date: 08/24/2011  PT Assessment/Plan  PT - Assessment/Plan Comments on Treatment Session: Improvement in activity and WB tolerance PT Plan: Discharge plan remains appropriate PT Frequency: 7X/week Recommendations for Other Services: OT consult Follow Up Recommendations: Home health PT;Skilled nursing facility Equipment Recommended: Defer to next venue PT Goals  Acute Rehab PT Goals PT Goal Formulation: With patient Time For Goal Achievement: 7 days Pt will go Supine/Side to Sit: with supervision Pt will go Sit to Supine/Side: with supervision Pt will go Sit to Stand: with supervision PT Goal: Sit to Stand - Progress: Progressing toward goal Pt will go Stand to Sit: with supervision PT Goal: Stand to Sit - Progress: Progressing toward goal Pt will Ambulate: 51 - 150 feet;with supervision;with rolling walker PT Goal: Ambulate - Progress: Progressing toward goal  PT Treatment Precautions/Restrictions  Precautions Precautions: Knee Required Braces or Orthoses: Yes Knee Immobilizer: Discontinue once straight leg raise with < 10 degree lag Restrictions Weight Bearing Restrictions: No Other Position/Activity Restrictions: wbat Mobility (including Balance) Bed Mobility Bed Mobility: Yes Supine to Sit: 3: Mod assist;With rails;HOB elevated (Comment degrees) Supine to Sit Details (indicate cue type and reason): min verbal cues for technique Transfers Sit to Stand: 1: +2 Total assist;With armrests;With upper extremity assist;From chair/3-in-1 (pt 70%) Sit to Stand Details (indicate cue type and reason): cues for use of UEs and for LE managment Stand to Sit: 1: +2 Total assist;To chair/3-in-1;With upper extremity assist;With armrests (pt 70%) Stand to Sit Details: cues for use of UEs and for LE managment Ambulation/Gait Ambulation/Gait: Yes Ambulation/Gait Assistance: 1: +2 Total  assist (pt 70%) Ambulation/Gait Assistance Details (indicate cue type and reason): cues for posture, sequence, stride length and position from RW Ambulation Distance (Feet): 100 Feet Assistive device: Rolling walker Gait Pattern: Step-to pattern    Exercise  Total Joint Exercises Ankle Circles/Pumps: AROM;15 reps;Supine;Both Quad Sets: AROM;15 reps;Both;Supine Heel Slides: AAROM;15 reps;Right;Supine Straight Leg Raises: AAROM;Right;15 reps;Supine End of Session PT - End of Session Equipment Utilized During Treatment: Gait belt;Right knee immobilizer Activity Tolerance: Patient tolerated treatment well Patient left: in chair;with call bell in reach;with family/visitor present Nurse Communication: Mobility status for transfers;Mobility status for ambulation General Behavior During Session: [facility] for tasks performed Cognition: [facility] for tasks performed  Graceland Wachter 08/24/2011, 1:27 PM

## 2011-08-25 ENCOUNTER — Inpatient Hospital Stay
Admission: RE | Admit: 2011-08-25 | Discharge: 2011-09-09 | Disposition: A | Discharge status: Home / Self Care | Payer: BLUE CROSS/BLUE SHIELD | Source: Ambulatory Visit | Attending: Internal Medicine | Admitting: Internal Medicine

## 2011-08-25 DIAGNOSIS — R262 Difficulty in walking, not elsewhere classified: Secondary | ICD-10-CM | POA: Diagnosis not present

## 2011-08-25 DIAGNOSIS — Z96659 Presence of unspecified artificial knee joint: Secondary | ICD-10-CM | POA: Diagnosis not present

## 2011-08-25 DIAGNOSIS — M25569 Pain in unspecified knee: Secondary | ICD-10-CM | POA: Diagnosis not present

## 2011-08-25 DIAGNOSIS — M1990 Unspecified osteoarthritis, unspecified site: Secondary | ICD-10-CM | POA: Diagnosis not present

## 2011-08-25 DIAGNOSIS — M1710 Unilateral primary osteoarthritis, unspecified knee: Secondary | ICD-10-CM | POA: Diagnosis not present

## 2011-08-25 DIAGNOSIS — Z5189 Encounter for other specified aftercare: Secondary | ICD-10-CM | POA: Diagnosis not present

## 2011-08-25 DIAGNOSIS — Z471 Aftercare following joint replacement surgery: Secondary | ICD-10-CM | POA: Diagnosis not present

## 2011-08-25 DIAGNOSIS — E871 Hypo-osmolality and hyponatremia: Secondary | ICD-10-CM | POA: Diagnosis not present

## 2011-08-25 DIAGNOSIS — R269 Unspecified abnormalities of gait and mobility: Secondary | ICD-10-CM | POA: Diagnosis not present

## 2011-08-25 DIAGNOSIS — M6281 Muscle weakness (generalized): Secondary | ICD-10-CM | POA: Diagnosis not present

## 2011-08-25 LAB — BASIC METABOLIC PANEL
Calcium: 8.5 mg/dL (ref 8.4–10.5)
Chloride: 97 mEq/L (ref 96–112)
Creatinine, Ser: 0.86 mg/dL (ref 0.50–1.35)
GFR calc Af Amer: >90 mL/min (ref >90)

## 2011-08-25 LAB — CBC
MCH: 28.4 pg (ref 26.0–34.0)
MCV: 85.2 fL (ref 78.0–100.0)
Platelets: 159 10*3/uL (ref 150–400)
RDW: 13.2 % (ref 11.5–15.5)
WBC: 11 10*3/uL — ABNORMAL HIGH (ref 4.0–10.5)

## 2011-08-25 MED ORDER — RIVAROXABAN 10 MG PO TABS: 10.0000 mg | ORAL_TABLET | Freq: Every day | ORAL | Status: DC

## 2011-08-25 MED ORDER — ACETAMINOPHEN 325 MG PO TABS: 650.0000 mg | ORAL_TABLET | Freq: Four times a day (QID) | ORAL | Status: AC | PRN

## 2011-08-25 MED ORDER — BISACODYL 10 MG RE SUPP: 10.0000 mg | Freq: Every day | RECTAL | Status: AC | PRN

## 2011-08-25 MED ORDER — DSS 100 MG PO CAPS: 100.0000 mg | ORAL_CAPSULE | Freq: Two times a day (BID) | ORAL | Status: AC

## 2011-08-25 MED ORDER — METHOCARBAMOL 500 MG PO TABS: 500.0000 mg | ORAL_TABLET | Freq: Four times a day (QID) | ORAL | Status: AC | PRN

## 2011-08-25 MED ORDER — POLYETHYLENE GLYCOL 3350 17 G PO PACK: 17.0000 g | PACK | Freq: Every day | ORAL | Status: AC | PRN

## 2011-08-25 MED ORDER — ONDANSETRON HCL 4 MG PO TABS: 4.0000 mg | ORAL_TABLET | Freq: Four times a day (QID) | ORAL | Status: AC | PRN

## 2011-08-25 MED ORDER — OXYCODONE HCL 5 MG PO TABS: 5.0000 mg | ORAL_TABLET | ORAL | Status: AC | PRN

## 2011-08-25 NOTE — Discharge Summary (Signed)
Physician Discharge Summary   Patient ID: Paul Lam MRN: [id number] DOB/AGE: 03/01/1941 71 y.o.  Admit date: 08/22/2011 Discharge date: 08/25/2011  Primary Diagnosis: Osteoarthritis knee, Right  Admission Diagnoses: Past Medical History  Diagnosis Date  . Hypertension   . Sleep apnea     CPAP machine diagnosed 8 yrs ago   . Shortness of breath     related to weight   . Chronic kidney disease     hx of kidney stones   . Cancer     skin cancer removed left ear 2-3 weeks ago   . Arthritis     generalized arthritis   . Anxiety     Discharge Diagnoses:  Principal Problem:  *OA (osteoarthritis) of knee Active Problems:  Postop Hyponatremia   Procedure: Procedure(s) (LRB): TOTAL KNEE ARTHROPLASTY (Right)   Consults: None  HPI: Paul Lam is a 71 y.o. year old male with end stage OA of his right knee with progressively worsening pain and dysfunction. He has constant pain, with activity and at rest and significant functional deficits with difficulties even with ADLs. He has had extensive non-op management including analgesics, injections of cortisone and home exercise program, but remains in significant pain with significant dysfunction. Radiographs show bone on bone arthritis medial compartment with varus deformity and tibial subluxation. He presents now for right Total Knee Arthroplasty.   Laboratory Data: Hospital Outpatient Visit on 08/15/2011  Component Date Value Range Status  . aPTT (seconds) 08/15/2011 36  24-37 Final  . WBC (K/uL) 08/15/2011 8.7  4.0-10.5 Final  . RBC (MIL/uL) 08/15/2011 4.85  4.22-5.81 Final  . Hemoglobin (g/dL) 32/44/0102 72.5  36.6-44.0 Final  . HCT (%) 08/15/2011 41.6  39.0-52.0 Final  . MCV (fL) 08/15/2011 85.8  78.0-100.0 Final  . MCH (pg) 08/15/2011 28.9  26.0-34.0 Final  . MCHC (g/dL) 34/74/2595 63.8  75.6-43.3 Final  . RDW (%) 08/15/2011 13.3  11.5-15.5 Final  . Platelets (K/uL) 08/15/2011 149* 150-400 Final  . Sodium (mEq/L)  08/15/2011 138  135-145 Final  . Potassium (mEq/L) 08/15/2011 4.3  3.5-5.1 Final  . Chloride (mEq/L) 08/15/2011 99  96-112 Final  . CO2 (mEq/L) 08/15/2011 29  19-32 Final  . Glucose, Bld (mg/dL) 29/51/8841 94  66-06 Final  . BUN (mg/dL) 30/16/0109 18  3-23 Final  . Creatinine, Ser (mg/dL) 55/73/2202 5.42  7.06-2.37 Final  . Calcium (mg/dL) 62/83/1517 9.2  6.1-60.7 Final  . Total Protein (g/dL) 37/03/6268 7.6  4.8-5.4 Final  . Albumin (g/dL) 62/70/3500 4.0  9.3-8.1 Final  . AST (U/L) 08/15/2011 16  0-37 Final   NO VISIBLE HEMOLYSIS  . ALT (U/L) 08/15/2011 8  0-53 Final  . Alkaline Phosphatase (U/L) 08/15/2011 95  39-117 Final  . Total Bilirubin (mg/dL) 82/99/3716 0.5  9.6-7.8 Final  . GFR calc non Af Amer (mL/min) 08/15/2011 86* >90 Final  . GFR calc Af Amer (mL/min) 08/15/2011 >90  >90 Final   Comment:                                 The eGFR has been calculated                          using the CKD EPI equation.                          This calculation has not been  validated in all clinical                          situations.                          eGFR's persistently                          <90 mL/min signify                          possible Chronic Kidney Disease.  Marland Kitchen Prothrombin Time (seconds) 08/15/2011 14.5  11.6-15.2 Final  . INR  08/15/2011 1.11  0.00-1.49 Final  . Color, Urine  08/15/2011 YELLOW  YELLOW Final  . APPearance  08/15/2011 CLEAR  CLEAR Final  . Specific Gravity, Urine  08/15/2011 1.018  1.005-1.030 Final  . pH  08/15/2011 6.0  5.0-8.0 Final  . Glucose, UA (mg/dL) 78/46/9629 NEGATIVE  NEGATIVE Final  . Hgb urine dipstick  08/15/2011 NEGATIVE  NEGATIVE Final  . Bilirubin Urine  08/15/2011 NEGATIVE  NEGATIVE Final  . Ketones, ur (mg/dL) 52/84/1324 NEGATIVE  NEGATIVE Final  . Protein, ur (mg/dL) 40/03/2724 NEGATIVE  NEGATIVE Final  . Urobilinogen, UA (mg/dL) 36/64/4034 0.2  7.4-2.5 Final  . Nitrite  08/15/2011 NEGATIVE  NEGATIVE  Final  . Leukocytes, UA  08/15/2011 NEGATIVE  NEGATIVE Final   MICROSCOPIC NOT DONE ON URINES WITH NEGATIVE PROTEIN, BLOOD, LEUKOCYTES, NITRITE, OR GLUCOSE <1000 mg/dL.  Marland Kitchen MRSA, PCR  08/15/2011 NEGATIVE  NEGATIVE Final  . Staphylococcus aureus  08/15/2011 NEGATIVE  NEGATIVE Final   Comment:                                 The Xpert SA Assay (FDA                          approved for NASAL specimens                          only), is one component of                          a comprehensive surveillance                          program.  It is not intended                          to diagnose infection nor to                          guide or monitor treatment.    Basename 08/25/11 0420 08/24/11 0454 08/23/11 0445  HGB 11.3* 11.8* 11.8*    Basename 08/25/11 0420 08/24/11 0454  WBC 11.0* 9.5  RBC 3.98* 4.14*  HCT 33.9* 35.7*  PLT 159 132*    Basename 08/25/11 0420 08/24/11 0454  NA 133* 134*  K 3.8 4.1  CL 97 100  CO2 28 28  BUN 13 8  CREATININE 0.86 0.76  GLUCOSE 121* 113*  CALCIUM 8.5 8.3*   No results found for this basename: LABPT:2,INR:2 in the last 72 hours  X-Rays: Chest 2 View  08/15/2011  *RADIOLOGY REPORT*  Clinical Data: Preop for knee surgery  CHEST - 2 VIEW  Comparison: Chest x-ray of 03/29/2011  Findings: No active infiltrate or effusion is seen.  Mediastinal contours are stable.  The heart is within upper limits of normal. There are degenerative changes throughout the thoracic spine.  IMPRESSION: Stable chest x-ray.  No active lung disease.  Original Report Authenticated By: Juline Patch, M.D.    EKG: Orders placed during the hospital encounter of 08/15/11  . EKG 12-LEAD  . EKG 12-LEAD     Hospital Course: Patient was admitted to [facility] and taken to the OR and underwent the above state procedure without complications.  Patient tolerated the procedure well and was later transferred to the recovery room and then to the orthopaedic floor for  postoperative care.  They were given PO and IV analgesics for pain control following their surgery.  They were given 24 hours of postoperative antibiotics and started on DVT prophylaxis.   PT and OT were ordered for total joint protocol.  Discharge planning consulted to help with postop disposition and equipment needs. He wanted to look into [facility] after the hospital stay. Patient had a decent night on the evening of surgery and started to get up with therapy on day one.  Hemovac drain was pulled without difficulty.  Continued to progress with therapy into day two.  Dressing was changed on day two and the incision was healing well.  By day three, the patient had progressed with therapy and meeting goals.  Incision was healing well.  Patient was seen in rounds and was ready to go to [facility].    Discharge Medications: Prior to Admission medications   Medication Sig Start Date End Date Taking? Authorizing Provider  ALPRAZolam Prudy Feeler) 0.5 MG tablet Take 0.5 mg by mouth 3 (three) times daily as needed. Anxiety    Yes Historical Provider, MD  bisoprolol-hydrochlorothiazide (ZIAC) 2.5-6.25 MG per tablet Take 1 tablet by mouth daily with breakfast.   Yes Historical Provider, MD  furosemide (LASIX) 20 MG tablet Take 20 mg by mouth daily with breakfast.   Yes Historical Provider, MD  lisinopril (PRINIVIL,ZESTRIL) 10 MG tablet Take 10 mg by mouth daily at 12 noon. Pt takes at lunchtime   Yes Historical Provider, MD  potassium chloride SA (K-DUR,KLOR-CON) 20 MEQ tablet Take 20 mEq by mouth daily.   Yes Historical Provider, MD  simvastatin (ZOCOR) 40 MG tablet Take 40 mg by mouth at bedtime.   Yes Historical Provider, MD  Tamsulosin HCl (FLOMAX) 0.4 MG CAPS Take 0.4 mg by mouth daily. Pt takes at lunchtime   Yes Historical Provider, MD  acetaminophen (TYLENOL) 325 MG tablet Take 2 tablets (650 mg total) by mouth every 6 (six) hours as needed (or Fever >/= 101). 08/25/11 08/24/12  Nazaiah Navarrete, PA    bisacodyl (DULCOLAX) 10 MG suppository Place 1 suppository (10 mg total) rectally daily as needed. 08/25/11 09/04/11  Aibhlinn Kalmar, PA  docusate sodium 100 MG CAPS Take 100 mg by mouth 2 (two) times daily. 08/25/11 09/04/11  Anias Bartol, PA  methocarbamol (ROBAXIN) 500 MG tablet Take 1 tablet (500 mg total) by mouth every 6 (six) hours as needed. 08/25/11 09/04/11  Anwitha Mapes, PA  ondansetron (ZOFRAN) 4 MG tablet Take 1 tablet (4 mg total) by mouth every 6 (six) hours as needed for nausea. 08/25/11 09/01/11  Tamari Redwine, PA  oxyCODONE (OXY IR/ROXICODONE) 5 MG immediate release  tablet Take 1-2 tablets (5-10 mg total) by mouth every 3 (three) hours as needed. 08/25/11 09/04/11  Maxcine Strong, PA  polyethylene glycol (MIRALAX / GLYCOLAX) packet Take 17 g by mouth daily as needed. 08/25/11 08/28/11  Jameya Pontiff Julien Girt, PA  rivaroxaban (XARELTO) 10 MG TABS tablet Take 1 tablet (10 mg total) by mouth daily with breakfast. Take for two and a half more weeks and then discontinue. 08/25/11   Destinie Thornsberry Julien Girt, PA    Diet: heart healthy Activity:WBAT Follow-up:in 2 weeks, the week of April 1-5 Disposition: [facility] Discharged Condition: good   Discharge Orders    Future Appointments: Provider: Department: Dept Phone: Center:   07/06/2012 9:30 AM Sherrie George, MD [facility] [phone] None     Future Orders Please Complete By Expires   Diet - low sodium heart healthy      Call MD / Call 911      Comments:   If you experience chest pain or shortness of breath, CALL 911 and be transported to the hospital emergency room.  If you develope a fever above 101 F, pus (white drainage) or increased drainage or redness at the wound, or calf pain, call your surgeon's office.   Constipation Prevention      Comments:   Drink plenty of fluids.  Prune juice may be helpful.  You may use a stool softener, such as Colace (over the counter) 100 mg twice a day.  Use MiraLax  (over the counter) for constipation as needed.   Increase activity slowly as tolerated      Weight Bearing as taught in Physical Therapy      Comments:   Use a walker or crutches as instructed.   Discharge instructions      Comments:   Pick up stool softner and laxative for home. Do not submerge incision under water. May shower. Continue to use ice for pain and swelling from surgery.    Driving restrictions      Comments:   No driving   Lifting restrictions      Comments:   No lifting   TED hose      Comments:   Use stockings (TED hose) for 3 weeks on both leg(s).  You may remove them at night for sleeping.   Change dressing      Comments:   Change dressing daily with sterile 4 x 4 inch gauze dressing and apply TED hose.   Do not put a pillow under the knee. Place it under the heel.        Medication List  As of 08/25/2011  8:41 AM   STOP taking these medications         fish oil-omega-3 fatty acids 1000 MG capsule      nabumetone 750 MG tablet         TAKE these medications         acetaminophen 325 MG tablet   Commonly known as: TYLENOL   Take 2 tablets (650 mg total) by mouth every 6 (six) hours as needed (or Fever >/= 101).      ALPRAZolam 0.5 MG tablet   Commonly known as: XANAX   Take 0.5 mg by mouth 3 (three) times daily as needed. Anxiety        bisacodyl 10 MG suppository   Commonly known as: DULCOLAX   Place 1 suppository (10 mg total) rectally daily as needed.      bisoprolol-hydrochlorothiazide 2.5-6.25 MG per tablet   Commonly known  asAldean Ast   Take 1 tablet by mouth daily with breakfast.      DSS 100 MG Caps   Take 100 mg by mouth 2 (two) times daily.      furosemide 20 MG tablet   Commonly known as: LASIX   Take 20 mg by mouth daily with breakfast.      lisinopril 10 MG tablet   Commonly known as: PRINIVIL,ZESTRIL   Take 10 mg by mouth daily at 12 noon. Pt takes at lunchtime      methocarbamol 500 MG tablet   Commonly known as: ROBAXIN     Take 1 tablet (500 mg total) by mouth every 6 (six) hours as needed.      ondansetron 4 MG tablet   Commonly known as: ZOFRAN   Take 1 tablet (4 mg total) by mouth every 6 (six) hours as needed for nausea.      oxyCODONE 5 MG immediate release tablet   Commonly known as: Oxy IR/ROXICODONE   Take 1-2 tablets (5-10 mg total) by mouth every 3 (three) hours as needed.      polyethylene glycol packet   Commonly known as: MIRALAX / GLYCOLAX   Take 17 g by mouth daily as needed.      potassium chloride SA 20 MEQ tablet   Commonly known as: K-DUR,KLOR-CON   Take 20 mEq by mouth daily.      rivaroxaban 10 MG Tabs tablet   Commonly known as: XARELTO   Take 1 tablet (10 mg total) by mouth daily with breakfast. Take for two and a half more weeks and then discontinue.      simvastatin 40 MG tablet   Commonly known as: ZOCOR   Take 40 mg by mouth at bedtime.      Tamsulosin HCl 0.4 MG Caps   Commonly known as: FLOMAX   Take 0.4 mg by mouth daily. Pt takes at lunchtime           Follow-up Information    Follow up with Loanne Drilling, MD. Schedule an appointment as soon as possible for a visit in 2 weeks. (Please have facility help make follow up for patient the week of April 1-5 at the office.)    Contact information:   [facility] [address] [place] [address] [phone]          Signed: Patrica Duel 08/25/2011, 8:41 AM

## 2011-08-25 NOTE — Progress Notes (Signed)
Physical Therapy Treatment Patient Details Name: Paul Lam MRN: [id number] DOB: Jul 16, 1940 Today's Date: 08/25/2011  PT Assessment/Plan  PT - Assessment/Plan PT Plan: Discharge plan remains appropriate PT Frequency: 7X/week Recommendations for Other Services: OT consult Follow Up Recommendations: Skilled nursing facility Equipment Recommended: Defer to next venue PT Goals  Acute Rehab PT Goals PT Goal Formulation: With patient Time For Goal Achievement: 7 days Pt will go Supine/Side to Sit: with supervision PT Goal: Supine/Side to Sit - Progress: Progressing toward goal Pt will go Sit to Supine/Side: with supervision PT Goal: Sit to Supine/Side - Progress: Progressing toward goal Pt will go Sit to Stand: with supervision PT Goal: Sit to Stand - Progress: Progressing toward goal Pt will go Stand to Sit: with supervision PT Goal: Stand to Sit - Progress: Progressing toward goal Pt will Ambulate: 51 - 150 feet;with supervision;with rolling walker PT Goal: Ambulate - Progress: Progressing toward goal  PT Treatment Precautions/Restrictions  Precautions Precautions: Knee Required Braces or Orthoses: Yes Knee Immobilizer: Discontinue once straight leg raise with < 10 degree lag Restrictions Weight Bearing Restrictions: No Other Position/Activity Restrictions: wbat Mobility (including Balance) Bed Mobility Supine to Sit: 3: Mod assist Supine to Sit Details (indicate cue type and reason): cues for sequence and use of UEs to self assist Sit to Supine: 3: Mod assist Sit to Supine - Details (indicate cue type and reason): cues for sequence and use of UEs to self assist Transfers Sit to Stand: 3: Mod assist Sit to Stand Details (indicate cue type and reason): cues for use of UEs and for LE managment - pt 70% from elevated surface Stand to Sit: 3: Mod assist Stand to Sit Details: cues for use of UEs and for LE managment - pt 70% from elevated  surface Ambulation/Gait Ambulation/Gait Assistance: 3: Mod assist Ambulation/Gait Assistance Details (indicate cue type and reason): cues for stride length, posture and position from RW Ambulation Distance (Feet): 138 Feet Assistive device: Rolling walker Gait Pattern: Step-to pattern    End of Session PT - End of Session Equipment Utilized During Treatment: Right knee immobilizer Activity Tolerance: Patient tolerated treatment well Patient left: in bed;with call bell in reach Nurse Communication: Mobility status for transfers;Mobility status for ambulation General Behavior During Session: [facility] for tasks performed Cognition: [facility] for tasks performed  Journee Kohen 08/25/2011, 1:13 PM

## 2011-08-25 NOTE — Progress Notes (Signed)
Physical Therapy Treatment Patient Details Name: ILHAN MADAN MRN: [id number] DOB: [birth year] Today's Date: 08/25/2011  PT Assessment/Plan  PT - Assessment/Plan PT Plan: Discharge plan remains appropriate PT Frequency: 7X/week Recommendations for Other Services: OT consult Follow Up Recommendations: Skilled nursing facility PT Goals     PT Treatment Precautions/Restrictions  Precautions Precautions: Knee Required Braces or Orthoses: Yes Knee Immobilizer: Discontinue once straight leg raise with < 10 degree lag Restrictions Weight Bearing Restrictions: No Other Position/Activity Restrictions: wbat Mobility (including Balance) Bed Mobility Supine to Sit: 3: Mod assist Supine to Sit Details (indicate cue type and reason): cues for sequence and use of UEs to self assist    Exercise  Total Joint Exercises Ankle Circles/Pumps: AROM;15 reps;Supine;Both Quad Sets: AROM;20 reps;Supine;Both Short Arc Quad: AROM;AAROM;Both;10 reps;5 reps;Supine Heel Slides: AAROM;20 reps;Supine;Right Straight Leg Raises: AAROM;AROM;Right;20 reps;Supine End of Session PT - End of Session Equipment Utilized During Treatment: Gait belt;Right knee immobilizer Activity Tolerance: Patient tolerated treatment well Patient left: in bed;with call bell in reach Nurse Communication: Mobility status for transfers;Mobility status for ambulation General Behavior During Session: [facility] for tasks performed Cognition: [facility] for tasks performed  Hennie Gosa 08/25/2011, 1:06 PM

## 2011-08-25 NOTE — Discharge Instructions (Signed)
Knee Rehabilitation, Guidelines Following Surgery Results after knee surgery are often greatly improved when you follow the exercise, range of motion and muscle strengthening exercises prescribed by your doctor. Safety measures are also important to protect the knee from further injury. Any time any of these exercises cause you to have increased pain or swelling in your knee joint, decrease the amount until you are comfortable again and slowly increase them. If you have problems or questions, call your caregiver or physical therapist for advice. HOME CARE INSTRUCTIONS   Remove items at home which could result in a fall. This includes throw rugs or furniture in walking pathways.   Continue medications as instructed.   You may shower or take tub baths when your staples or stitches are removed or as instructed.   Walk using crutches or walker as instructed.   Put weight on your legs and walk as much as is comfortable.   You may resume a sexual relationship in one month or when given the OK by your doctor.   Return to work as instructed by your doctor.   Do not drive a car for 6 weeks or as instructed.   Wear elastic stockings until instructed not to.   Make sure you keep all of your appointments after your operation with all of your doctors and caregivers.  RANGE OF MOTION AND STRENGTHENING EXERCISES Rehabilitation of the knee is important following a knee injury or an operation. After just a few days of immobilization, the muscles of the thigh which control the knee become weakened and shrink (atrophy). Knee exercises are designed to build up the tone and strength of the thigh muscles and to improve knee motion. Often times heat used for twenty to thirty minutes before working out will loosen up your tissues and help with improving the range of motion. These exercises can be done on a training (exercise) mat, on the floor, on a table or on a bed. Use what ever works the best and is most  comfortable for you Knee exercises include:  Leg Lifts - While your knee is still immobilized in a splint or cast, you can do straight leg raises. Lift the leg to 60 degrees, hold for 3 sec, and slowly lower the leg. Repeat 10-20 times 2-3 times daily. Perform this exercise against resistance later as your knee gets better.   Quad and Hamstring Sets - Tighten up the muscle on the front of the thigh (Quad) and hold for 5-10 sec. Repeat this 10-20 times hourly. Hamstring sets are done by pushing the foot backward against an object and holding for 5-10 sec. Repeat as with quad sets.  A rehabilitation program following serious knee injuries can speed recovery and prevent re-injury in the future due to weakened muscles. Contact your doctor or a physical therapist for more information on knee rehabilitation. MAKE SURE YOU:   Understand these instructions.   Will watch your condition.   Will get help right away if you are not doing well or get worse.  Document Released: 05/23/2005 Document Revised: 05/12/2011 Document Reviewed: 11/10/2006 [employer] Patient Information 2012 [employer].  Pick up stool softner and laxative for home. Do not submerge incision under water. May shower. Continue to use ice for pain and swelling from surgery.  

## 2011-08-25 NOTE — Progress Notes (Signed)
Pt to be d/c to [facility] today via [facility] transport for [facility] SNF placement.   Cori Razor  LCSW  [phone]

## 2011-08-25 NOTE — Progress Notes (Signed)
Subjective: 3 Days Post-Op Procedure(s) (LRB): TOTAL KNEE ARTHROPLASTY (Right) Patient reports pain as mild.   Patient seen in rounds with Dr. Lequita Halt. Patient is doing well and ready to go to [facility].  Objective: Vital signs in last 24 hours: Temp:  [97.9 F (36.6 C)-99 F (37.2 C)] 97.9 F (36.6 C) (03/21 0620) Pulse Rate:  [85-112] 85  (03/21 0620) Resp:  [17-18] 17  (03/21 0620) BP: (124-197)/(70-74) 124/72 mmHg (03/21 0620) SpO2:  [95 %-96 %] 95 % (03/21 0620)  Intake/Output from previous day:  Intake/Output Summary (Last 24 hours) at 08/25/11 0830 Last data filed at 08/25/11 0620  Gross per 24 hour  Intake   1547 ml  Output   1750 ml  Net   -203 ml    Intake/Output this shift:    Labs:  Basename 08/25/11 0420 08/24/11 0454 08/23/11 0445  HGB 11.3* 11.8* 11.8*    Basename 08/25/11 0420 08/24/11 0454  WBC 11.0* 9.5  RBC 3.98* 4.14*  HCT 33.9* 35.7*  PLT 159 132*    Basename 08/25/11 0420 08/24/11 0454  NA 133* 134*  K 3.8 4.1  CL 97 100  CO2 28 28  BUN 13 8  CREATININE 0.86 0.76  GLUCOSE 121* 113*  CALCIUM 8.5 8.3*   No results found for this basename: LABPT:2,INR:2 in the last 72 hours  Exam: Neurovascular intact Sensation intact distally Incision - clean, dry, no drainage Motor function intact - moving foot and toes well on exam.   Assessment/Plan: 3 Days Post-Op Procedure(s) (LRB): TOTAL KNEE ARTHROPLASTY (Right) Procedure(s) (LRB): TOTAL KNEE ARTHROPLASTY (Right) Past Medical History  Diagnosis Date  . Hypertension   . Sleep apnea     CPAP machine diagnosed 8 yrs ago   . Shortness of breath     related to weight   . Chronic kidney disease     hx of kidney stones   . Cancer     skin cancer removed left ear 2-3 weeks ago   . Arthritis     generalized arthritis   . Anxiety    Principal Problem:  *OA (osteoarthritis) of knee Active Problems:  Postop Hyponatremia   Up with therapy Discharge to SNF Diet - heart  healthy Follow up - in 2 weeks, week of April 1-5 Activity - WBAT Condition Upon Discharge - Good D/C Meds - See DC Summary DVT Prophylaxis - Xarelto Protocol   Paul Lam 08/25/2011, 8:30 AM

## 2011-08-26 NOTE — Progress Notes (Signed)
CARE MANAGEMENT NOTE 08/26/2011  Patient:  SAMIEL, PEEL   Account Number:  [id number]  Date Initiated:  08/23/2011  Documentation initiated by:  Colleen Can  Subjective/Objective Assessment:   dx osteoarthritis right knee: total knee replacement     Action/Plan:   ST SNF   Anticipated DC Date:  08/25/2011   Anticipated DC Plan:  SKILLED NURSING FACILITY  In-house referral  Clinical Social Worker      DC Planning Services  NA      [facility] Choice  NA   Choice offered to / List presented to:  NA   DME arranged  NA      DME agency  NA     HH arranged  NA      HH agency  NA   Status of service:  Completed, signed off Medicare Important Message given?  NA - LOS <3 / Initial given by admissions (If response is "NO", the following Medicare IM given date fields will be blank) Date Medicare IM given:   Date Additional Medicare IM given:    Discharge Disposition:  SKILLED NURSING FACILITY

## 2011-08-29 DIAGNOSIS — E871 Hypo-osmolality and hyponatremia: Secondary | ICD-10-CM | POA: Diagnosis not present

## 2011-08-29 DIAGNOSIS — M1990 Unspecified osteoarthritis, unspecified site: Secondary | ICD-10-CM | POA: Diagnosis not present

## 2011-08-31 ENCOUNTER — Encounter (HOSPITAL_COMMUNITY): Payer: Self-pay | Admitting: Orthopedic Surgery

## 2011-09-09 DIAGNOSIS — M1990 Unspecified osteoarthritis, unspecified site: Secondary | ICD-10-CM | POA: Diagnosis not present

## 2011-09-12 DIAGNOSIS — N189 Chronic kidney disease, unspecified: Secondary | ICD-10-CM | POA: Diagnosis not present

## 2011-09-12 DIAGNOSIS — IMO0001 Reserved for inherently not codable concepts without codable children: Secondary | ICD-10-CM | POA: Diagnosis not present

## 2011-09-12 DIAGNOSIS — Z471 Aftercare following joint replacement surgery: Secondary | ICD-10-CM | POA: Diagnosis not present

## 2011-09-12 DIAGNOSIS — I129 Hypertensive chronic kidney disease with stage 1 through stage 4 chronic kidney disease, or unspecified chronic kidney disease: Secondary | ICD-10-CM | POA: Diagnosis not present

## 2011-09-12 DIAGNOSIS — Z96659 Presence of unspecified artificial knee joint: Secondary | ICD-10-CM | POA: Diagnosis not present

## 2011-09-13 DIAGNOSIS — Z471 Aftercare following joint replacement surgery: Secondary | ICD-10-CM | POA: Diagnosis not present

## 2011-09-13 DIAGNOSIS — Z96659 Presence of unspecified artificial knee joint: Secondary | ICD-10-CM | POA: Diagnosis not present

## 2011-09-13 DIAGNOSIS — IMO0001 Reserved for inherently not codable concepts without codable children: Secondary | ICD-10-CM | POA: Diagnosis not present

## 2011-09-13 DIAGNOSIS — I129 Hypertensive chronic kidney disease with stage 1 through stage 4 chronic kidney disease, or unspecified chronic kidney disease: Secondary | ICD-10-CM | POA: Diagnosis not present

## 2011-09-13 DIAGNOSIS — N189 Chronic kidney disease, unspecified: Secondary | ICD-10-CM | POA: Diagnosis not present

## 2011-09-14 DIAGNOSIS — Z96659 Presence of unspecified artificial knee joint: Secondary | ICD-10-CM | POA: Diagnosis not present

## 2011-09-14 DIAGNOSIS — I129 Hypertensive chronic kidney disease with stage 1 through stage 4 chronic kidney disease, or unspecified chronic kidney disease: Secondary | ICD-10-CM | POA: Diagnosis not present

## 2011-09-14 DIAGNOSIS — Z471 Aftercare following joint replacement surgery: Secondary | ICD-10-CM | POA: Diagnosis not present

## 2011-09-14 DIAGNOSIS — IMO0001 Reserved for inherently not codable concepts without codable children: Secondary | ICD-10-CM | POA: Diagnosis not present

## 2011-09-14 DIAGNOSIS — N189 Chronic kidney disease, unspecified: Secondary | ICD-10-CM | POA: Diagnosis not present

## 2011-09-15 DIAGNOSIS — Z96659 Presence of unspecified artificial knee joint: Secondary | ICD-10-CM | POA: Diagnosis not present

## 2011-09-15 DIAGNOSIS — N189 Chronic kidney disease, unspecified: Secondary | ICD-10-CM | POA: Diagnosis not present

## 2011-09-15 DIAGNOSIS — I129 Hypertensive chronic kidney disease with stage 1 through stage 4 chronic kidney disease, or unspecified chronic kidney disease: Secondary | ICD-10-CM | POA: Diagnosis not present

## 2011-09-15 DIAGNOSIS — IMO0001 Reserved for inherently not codable concepts without codable children: Secondary | ICD-10-CM | POA: Diagnosis not present

## 2011-09-15 DIAGNOSIS — Z471 Aftercare following joint replacement surgery: Secondary | ICD-10-CM | POA: Diagnosis not present

## 2011-09-16 DIAGNOSIS — IMO0001 Reserved for inherently not codable concepts without codable children: Secondary | ICD-10-CM | POA: Diagnosis not present

## 2011-09-16 DIAGNOSIS — Z96659 Presence of unspecified artificial knee joint: Secondary | ICD-10-CM | POA: Diagnosis not present

## 2011-09-16 DIAGNOSIS — G8929 Other chronic pain: Secondary | ICD-10-CM | POA: Diagnosis not present

## 2011-09-16 DIAGNOSIS — Z471 Aftercare following joint replacement surgery: Secondary | ICD-10-CM | POA: Diagnosis not present

## 2011-09-16 DIAGNOSIS — I129 Hypertensive chronic kidney disease with stage 1 through stage 4 chronic kidney disease, or unspecified chronic kidney disease: Secondary | ICD-10-CM | POA: Diagnosis not present

## 2011-09-16 DIAGNOSIS — N189 Chronic kidney disease, unspecified: Secondary | ICD-10-CM | POA: Diagnosis not present

## 2011-09-16 DIAGNOSIS — Z6841 Body Mass Index (BMI) 40.0 and over, adult: Secondary | ICD-10-CM | POA: Diagnosis not present

## 2011-09-20 DIAGNOSIS — Z471 Aftercare following joint replacement surgery: Secondary | ICD-10-CM | POA: Diagnosis not present

## 2011-09-20 DIAGNOSIS — Z96659 Presence of unspecified artificial knee joint: Secondary | ICD-10-CM | POA: Diagnosis not present

## 2011-09-20 DIAGNOSIS — N189 Chronic kidney disease, unspecified: Secondary | ICD-10-CM | POA: Diagnosis not present

## 2011-09-20 DIAGNOSIS — I129 Hypertensive chronic kidney disease with stage 1 through stage 4 chronic kidney disease, or unspecified chronic kidney disease: Secondary | ICD-10-CM | POA: Diagnosis not present

## 2011-09-20 DIAGNOSIS — IMO0001 Reserved for inherently not codable concepts without codable children: Secondary | ICD-10-CM | POA: Diagnosis not present

## 2011-09-21 DIAGNOSIS — I129 Hypertensive chronic kidney disease with stage 1 through stage 4 chronic kidney disease, or unspecified chronic kidney disease: Secondary | ICD-10-CM | POA: Diagnosis not present

## 2011-09-21 DIAGNOSIS — Z471 Aftercare following joint replacement surgery: Secondary | ICD-10-CM | POA: Diagnosis not present

## 2011-09-21 DIAGNOSIS — Z96659 Presence of unspecified artificial knee joint: Secondary | ICD-10-CM | POA: Diagnosis not present

## 2011-09-21 DIAGNOSIS — IMO0001 Reserved for inherently not codable concepts without codable children: Secondary | ICD-10-CM | POA: Diagnosis not present

## 2011-09-21 DIAGNOSIS — N189 Chronic kidney disease, unspecified: Secondary | ICD-10-CM | POA: Diagnosis not present

## 2011-09-22 DIAGNOSIS — D235 Other benign neoplasm of skin of trunk: Secondary | ICD-10-CM | POA: Diagnosis not present

## 2011-09-22 DIAGNOSIS — I129 Hypertensive chronic kidney disease with stage 1 through stage 4 chronic kidney disease, or unspecified chronic kidney disease: Secondary | ICD-10-CM | POA: Diagnosis not present

## 2011-09-22 DIAGNOSIS — IMO0001 Reserved for inherently not codable concepts without codable children: Secondary | ICD-10-CM | POA: Diagnosis not present

## 2011-09-22 DIAGNOSIS — Z85828 Personal history of other malignant neoplasm of skin: Secondary | ICD-10-CM | POA: Diagnosis not present

## 2011-09-22 DIAGNOSIS — N189 Chronic kidney disease, unspecified: Secondary | ICD-10-CM | POA: Diagnosis not present

## 2011-09-22 DIAGNOSIS — L570 Actinic keratosis: Secondary | ICD-10-CM | POA: Diagnosis not present

## 2011-09-22 DIAGNOSIS — Z471 Aftercare following joint replacement surgery: Secondary | ICD-10-CM | POA: Diagnosis not present

## 2011-09-22 DIAGNOSIS — C44611 Basal cell carcinoma of skin of unspecified upper limb, including shoulder: Secondary | ICD-10-CM | POA: Diagnosis not present

## 2011-09-22 DIAGNOSIS — Z96659 Presence of unspecified artificial knee joint: Secondary | ICD-10-CM | POA: Diagnosis not present

## 2011-09-23 DIAGNOSIS — IMO0001 Reserved for inherently not codable concepts without codable children: Secondary | ICD-10-CM | POA: Diagnosis not present

## 2011-09-23 DIAGNOSIS — Z96659 Presence of unspecified artificial knee joint: Secondary | ICD-10-CM | POA: Diagnosis not present

## 2011-09-23 DIAGNOSIS — Z471 Aftercare following joint replacement surgery: Secondary | ICD-10-CM | POA: Diagnosis not present

## 2011-09-23 DIAGNOSIS — I129 Hypertensive chronic kidney disease with stage 1 through stage 4 chronic kidney disease, or unspecified chronic kidney disease: Secondary | ICD-10-CM | POA: Diagnosis not present

## 2011-09-23 DIAGNOSIS — N189 Chronic kidney disease, unspecified: Secondary | ICD-10-CM | POA: Diagnosis not present

## 2011-09-27 DIAGNOSIS — Z471 Aftercare following joint replacement surgery: Secondary | ICD-10-CM | POA: Diagnosis not present

## 2011-09-27 DIAGNOSIS — I129 Hypertensive chronic kidney disease with stage 1 through stage 4 chronic kidney disease, or unspecified chronic kidney disease: Secondary | ICD-10-CM | POA: Diagnosis not present

## 2011-09-27 DIAGNOSIS — N189 Chronic kidney disease, unspecified: Secondary | ICD-10-CM | POA: Diagnosis not present

## 2011-09-27 DIAGNOSIS — IMO0001 Reserved for inherently not codable concepts without codable children: Secondary | ICD-10-CM | POA: Diagnosis not present

## 2011-09-27 DIAGNOSIS — Z96659 Presence of unspecified artificial knee joint: Secondary | ICD-10-CM | POA: Diagnosis not present

## 2011-09-28 DIAGNOSIS — IMO0001 Reserved for inherently not codable concepts without codable children: Secondary | ICD-10-CM | POA: Diagnosis not present

## 2011-09-28 DIAGNOSIS — Z96659 Presence of unspecified artificial knee joint: Secondary | ICD-10-CM | POA: Diagnosis not present

## 2011-09-28 DIAGNOSIS — Z471 Aftercare following joint replacement surgery: Secondary | ICD-10-CM | POA: Diagnosis not present

## 2011-09-28 DIAGNOSIS — I129 Hypertensive chronic kidney disease with stage 1 through stage 4 chronic kidney disease, or unspecified chronic kidney disease: Secondary | ICD-10-CM | POA: Diagnosis not present

## 2011-09-28 DIAGNOSIS — N189 Chronic kidney disease, unspecified: Secondary | ICD-10-CM | POA: Diagnosis not present

## 2011-09-29 DIAGNOSIS — M1710 Unilateral primary osteoarthritis, unspecified knee: Secondary | ICD-10-CM | POA: Diagnosis not present

## 2011-09-30 DIAGNOSIS — N189 Chronic kidney disease, unspecified: Secondary | ICD-10-CM | POA: Diagnosis not present

## 2011-09-30 DIAGNOSIS — Z471 Aftercare following joint replacement surgery: Secondary | ICD-10-CM | POA: Diagnosis not present

## 2011-09-30 DIAGNOSIS — Z96659 Presence of unspecified artificial knee joint: Secondary | ICD-10-CM | POA: Diagnosis not present

## 2011-09-30 DIAGNOSIS — I129 Hypertensive chronic kidney disease with stage 1 through stage 4 chronic kidney disease, or unspecified chronic kidney disease: Secondary | ICD-10-CM | POA: Diagnosis not present

## 2011-09-30 DIAGNOSIS — IMO0001 Reserved for inherently not codable concepts without codable children: Secondary | ICD-10-CM | POA: Diagnosis not present

## 2011-10-03 DIAGNOSIS — N189 Chronic kidney disease, unspecified: Secondary | ICD-10-CM | POA: Diagnosis not present

## 2011-10-03 DIAGNOSIS — I129 Hypertensive chronic kidney disease with stage 1 through stage 4 chronic kidney disease, or unspecified chronic kidney disease: Secondary | ICD-10-CM | POA: Diagnosis not present

## 2011-10-03 DIAGNOSIS — Z96659 Presence of unspecified artificial knee joint: Secondary | ICD-10-CM | POA: Diagnosis not present

## 2011-10-03 DIAGNOSIS — Z471 Aftercare following joint replacement surgery: Secondary | ICD-10-CM | POA: Diagnosis not present

## 2011-10-03 DIAGNOSIS — IMO0001 Reserved for inherently not codable concepts without codable children: Secondary | ICD-10-CM | POA: Diagnosis not present

## 2011-10-05 DIAGNOSIS — N189 Chronic kidney disease, unspecified: Secondary | ICD-10-CM | POA: Diagnosis not present

## 2011-10-05 DIAGNOSIS — I129 Hypertensive chronic kidney disease with stage 1 through stage 4 chronic kidney disease, or unspecified chronic kidney disease: Secondary | ICD-10-CM | POA: Diagnosis not present

## 2011-10-05 DIAGNOSIS — IMO0001 Reserved for inherently not codable concepts without codable children: Secondary | ICD-10-CM | POA: Diagnosis not present

## 2011-10-05 DIAGNOSIS — Z96659 Presence of unspecified artificial knee joint: Secondary | ICD-10-CM | POA: Diagnosis not present

## 2011-10-05 DIAGNOSIS — Z471 Aftercare following joint replacement surgery: Secondary | ICD-10-CM | POA: Diagnosis not present

## 2011-10-07 DIAGNOSIS — I129 Hypertensive chronic kidney disease with stage 1 through stage 4 chronic kidney disease, or unspecified chronic kidney disease: Secondary | ICD-10-CM | POA: Diagnosis not present

## 2011-10-07 DIAGNOSIS — N189 Chronic kidney disease, unspecified: Secondary | ICD-10-CM | POA: Diagnosis not present

## 2011-10-07 DIAGNOSIS — Z471 Aftercare following joint replacement surgery: Secondary | ICD-10-CM | POA: Diagnosis not present

## 2011-10-07 DIAGNOSIS — IMO0001 Reserved for inherently not codable concepts without codable children: Secondary | ICD-10-CM | POA: Diagnosis not present

## 2011-10-07 DIAGNOSIS — Z96659 Presence of unspecified artificial knee joint: Secondary | ICD-10-CM | POA: Diagnosis not present

## 2011-10-11 ENCOUNTER — Ambulatory Visit (HOSPITAL_COMMUNITY)
Admission: RE | Admit: 2011-10-11 | Discharge: 2011-10-11 | Disposition: A | Discharge status: Home / Self Care | Payer: Medicare Other | Source: Ambulatory Visit | Attending: Orthopedic Surgery | Admitting: Orthopedic Surgery

## 2011-10-11 DIAGNOSIS — IMO0001 Reserved for inherently not codable concepts without codable children: Secondary | ICD-10-CM | POA: Diagnosis not present

## 2011-10-11 DIAGNOSIS — M25569 Pain in unspecified knee: Secondary | ICD-10-CM | POA: Diagnosis not present

## 2011-10-11 DIAGNOSIS — R262 Difficulty in walking, not elsewhere classified: Secondary | ICD-10-CM | POA: Diagnosis not present

## 2011-10-11 DIAGNOSIS — M6281 Muscle weakness (generalized): Secondary | ICD-10-CM | POA: Insufficient documentation

## 2011-10-11 DIAGNOSIS — M25661 Stiffness of right knee, not elsewhere classified: Secondary | ICD-10-CM | POA: Insufficient documentation

## 2011-10-11 DIAGNOSIS — Z96659 Presence of unspecified artificial knee joint: Secondary | ICD-10-CM | POA: Insufficient documentation

## 2011-10-11 NOTE — Evaluation (Addendum)
Physical Therapy Evaluation  Patient Details  Name: Paul Lam MRN: [id number] Date of Birth: Sep 29, 1940  Today's Date: 10/11/2011 Time: 4098-1191 PT Time Calculation (min): 52 min  Visit#: 1  of 12   Re-eval: 11/10/11 Assessment Diagnosis: R total knee  Surgical Date: 08/22/11  Authorization: medicare needs G-Codes   Past Medical History:  Past Medical History  Diagnosis Date  . Hypertension   . Sleep apnea     CPAP machine diagnosed 8 yrs ago   . Shortness of breath     related to weight   . Chronic kidney disease     hx of kidney stones   . Cancer     skin cancer removed left ear 2-3 weeks ago   . Arthritis     generalized arthritis   . Anxiety    Past Surgical History:  Past Surgical History  Procedure Date  . Other surgical history     left ear skin ca removed  2 weeks ago - not completeely healed 08/15/11   . Eye surgery     detached retina on right eye 11/12   . Appendectomy   . Tonsillectomy   . Total knee arthroplasty 08/22/2011    Procedure: TOTAL KNEE ARTHROPLASTY;  Surgeon: Loanne Drilling, MD;  Location: [facility];  Service: Orthopedics;  Laterality: Right;    Subjective Symptoms/Limitations Symptoms: Mr. Paul Lam had a R total knee replacement on 08/22/11.  He was discharged on 08/26/11 to SNF for two weeks after which he recieved home health.  Home health ended on  10/07/11.  The patient is currently walking with a cane.  He has been referred to therapy to improve his functional ability. How long can you sit comfortably?: The patient states that he is able to sit for five to ten minutes. How long can you stand comfortably?: The patient states that he is able to stand for five to ten minutes. How long can you walk comfortably?: The patient states that he is able to walk with a cane for twenty minutes at a time Special Tests: The patient is icing several times a day.  He is having significant pain especially after five pm.  He is waking up three to four times a  night.  Last night it was every two hours.  Taking pain meds every 4-5 hours. Pain Assessment Currently in Pain?: No/denies Multiple Pain Sites: No   Prior Functioning  Home Living Lives With: Spouse Home Access: Stairs to enter Entrance Stairs-Number of Steps:  (4) Entrance Stairs-Rails: Right Prior Function Level of Independence: Independent with basic ADLs Able to Take Stairs?: Reciprically Leisure: Hobbies-yes (Comment) Comments: famer, welding  Cognition/Observation Cognition Overall Cognitive Status: Appears within functional limits for tasks assessed  Sensation/Coordination/Flexibility/Functional Tests Functional Tests Functional Tests: LEFS 23/80  Assessment RLE AROM (degrees) Right Knee Extension 0-130: 17  Right Knee Flexion 0-140: 100  RLE Strength Right Hip Flexion: 5/5 Right Hip Extension: 3/5 Right Hip ABduction: 3-/5 Right Hip ADduction: 3/5 Right Knee Flexion: 4/5 Right Knee Extension: 3+/5 Right Ankle Dorsiflexion: 5/5 Palpation Palpation: pitting edema in LE  Exercise/Treatments  Seated Long Arc Quad: 10 reps Other Seated Knee Exercises: toe raise x10 Supine Quad Sets: 10 reps Terminal Knee Extension: 10 reps Sidelying Hip ABduction: 10 reps Hip ADduction: 10 reps Prone  Hamstring Curl: 10 reps Hip Extension: 10 reps   Manual Therapy Manual Therapy: Massage Massage: retro massagge for swelling  Physical Therapy Assessment and Plan PT Assessment and Plan Clinical Impression Statement:  Pt with decreased functional ability due to TKR who will benefit from skilled therapy to maximize functional performance and improve quality of life. Pt will benefit from skilled therapeutic intervention in order to improve on the following deficits: Decreased activity tolerance;Decreased mobility;Decreased range of motion;Decreased strength;Difficulty walking;Increased edema;Pain Rehab Potential: Good PT Frequency: Min 3X/week PT Duration: 4 weeks PT  Treatment/Interventions: Gait training;Therapeutic activities;Therapeutic exercise;Patient/family education (modalities as needed for pain and swelling) PT Plan: Begin mini-squat; rockerboard, standing terminal extension and bike next treatment.     Goals Home Exercise Program Pt will Perform Home Exercise Program: Independently PT Short Term Goals PT Short Term Goal 1: ROM to increase to 10 -110 to improve gait pattern PT Short Term Goal 2: Pt  pain to be no greater than a 5/10 to decrease waking from sleep to only one to two times a night PT Short Term Goal 3: ambulate in house without an assistive device. PT Long Term Goals Time to Complete Long Term Goals: 4 weeks PT Long Term Goal 1: ROM to be improved to 5 to 120 to allow normalized gt. PT Long Term Goal 2: strength to be wnl to allow pt to stand for an hour at a time without fatigue of discomfort. Long Term Goal 3: Pt to be able to sit for two hours at a time for traveling. Long Term Goal 4: Pt to be able to go up and down steps reciprocally. 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 OA (osteoarthritis) of knee  . Postop Hyponatremia  . Stiffness of knee joint, right  . Difficulty in walking  . Pain due to total knee replacement    PT - End of Session Activity Tolerance: Patient tolerated treatment well General Behavior During Session: [facility] for tasks performed Cognition: [facility] for tasks performed PT Plan of Care PT Patient Instructions: given Consulted and Agree with Plan of Care: Patient  GP  Functional Reporting Modifier  Current Status  [phone] - Mobility: Walking & Moving Around CL - At least 60% but less than 80% impaired, limited or restricted  Goal Status  [id number] - Mobility: [place] & Moving  Around CI - At least 1% but less than 20% impaired, limited or restricted  I chose CL due to the LEFS and 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  I chose CI due to the patient having chronic edema in his legs which affect his ambulation ability. Marquin Patino,CINDY 10/11/2011, 12:10 PM  Physician Documentation Your signature is required to indicate approval of the treatment plan as stated above.  Please sign and either send electronically or make a copy of this report for your files and return this physician signed original.   Please mark one 1.__approve of plan  2. ___approve of plan with the following conditions.   ______________________________                                                          _____________________ Physician Signature  Date

## 2011-10-13 ENCOUNTER — Ambulatory Visit (HOSPITAL_COMMUNITY)
Admission: RE | Admit: 2011-10-13 | Discharge: 2011-10-13 | Disposition: A | Discharge status: Home / Self Care | Payer: Medicare Other | Source: Ambulatory Visit | Attending: Family Medicine | Admitting: Family Medicine

## 2011-10-13 DIAGNOSIS — R262 Difficulty in walking, not elsewhere classified: Secondary | ICD-10-CM | POA: Diagnosis not present

## 2011-10-13 DIAGNOSIS — M25569 Pain in unspecified knee: Secondary | ICD-10-CM | POA: Diagnosis not present

## 2011-10-13 DIAGNOSIS — M6281 Muscle weakness (generalized): Secondary | ICD-10-CM | POA: Diagnosis not present

## 2011-10-13 DIAGNOSIS — IMO0001 Reserved for inherently not codable concepts without codable children: Secondary | ICD-10-CM | POA: Diagnosis not present

## 2011-10-13 NOTE — Progress Notes (Signed)
Physical Therapy Treatment Patient Details  Name: Paul Lam MRN: [id number] Date of Birth: 1940/07/19  Today's Date: 10/13/2011 Time: 0924-1016 PT Time Calculation (min): 52 min  Visit#: 2  of 12   Re-eval: 11/10/11    Authorization: medicare needs G-code at 10  Subjective: Symptoms/Limitations Symptoms: Pt states that his knee felt better after the retromassage and his wife completed a retromassage as well. Pain Assessment Currently in Pain?: Yes Pain Score:   2 Pain Location: Knee Pain Orientation: Right Pain Onset: More than a month ago Pain Frequency: Intermittent   Exercise/Treatments   Aerobic Stationary Bike:  (8 min Seat at 11 able to go around forward.) Standing Heel Raises: 10 reps (up on both down on R) Terminal Knee Extension: 10 reps Functional Squat: 15 reps Seated Long Arc Quad: 10 reps;Weights Long Arc Quad Weight: 3 lbs. Supine Quad Sets: 15 reps Terminal Knee Extension: 15 reps Other Supine Knee Exercises: PROM for ext x 30 sec.    Prone  Hamstring Curl: 10 reps;Limitations Hamstring Curl Limitations: 3# Other Prone Exercises: PROM for flex 3x30   Manual Therapy Manual Therapy: Edema management Edema Management: retromassage  Physical Therapy Assessment and Plan PT Assessment and Plan Clinical Impression Statement: added bike with full revolution, rockerboard, standing and supine terminal ext., heelraises, and squats. Rehab Potential: Good PT Frequency: Min 3X/week PT Plan: continue to progress pt with forward lunge with leg on 4" step,(step against wall)    Goals    Problem List Patient Active Problem List  Diagnoses  . OA (osteoarthritis) of knee  . Postop Hyponatremia  . Stiffness of knee joint, right  . Difficulty in walking  . Pain due to total knee replacement    PT - End of Session Activity Tolerance: Patient tolerated treatment well General Behavior During Session: [facility] for tasks performed Cognition: [facility] for tasks  performed  GP No functional reporting required  Tharon Kitch,CINDY 10/13/2011, 10:07 AM

## 2011-10-17 ENCOUNTER — Ambulatory Visit (HOSPITAL_COMMUNITY)
Admission: RE | Admit: 2011-10-17 | Discharge: 2011-10-17 | Disposition: A | Discharge status: Home / Self Care | Payer: Medicare Other | Source: Ambulatory Visit | Attending: Orthopedic Surgery | Admitting: Orthopedic Surgery

## 2011-10-17 DIAGNOSIS — M6281 Muscle weakness (generalized): Secondary | ICD-10-CM | POA: Diagnosis not present

## 2011-10-17 DIAGNOSIS — M25569 Pain in unspecified knee: Secondary | ICD-10-CM | POA: Diagnosis not present

## 2011-10-17 DIAGNOSIS — R262 Difficulty in walking, not elsewhere classified: Secondary | ICD-10-CM | POA: Diagnosis not present

## 2011-10-17 DIAGNOSIS — IMO0001 Reserved for inherently not codable concepts without codable children: Secondary | ICD-10-CM | POA: Diagnosis not present

## 2011-10-17 NOTE — Progress Notes (Signed)
Physical Therapy Treatment Patient Details  Name: Paul Lam MRN: [id number] Date of Birth: 1941-04-24  Today's Date: 10/17/2011 Time: 4098-1191 PT Time Calculation (min): 52 min Visit#: 3  of 12   Re-eval: 11/10/11 Charges: Therex x 42'  Authorization: medicare needs G-code at 10  Authorization Time Period:    Authorization Visit#: 3  of 10    Subjective: Symptoms/Limitations Symptoms: Pt reports that he is able to go longer periods between pain pills. Pain Assessment Currently in Pain?: No/denies (After pain pill) Pain Score: 0-No pain   Exercise/Treatments Aerobic Stationary Bike: 6'@1 .0 seat 11 Standing Heel Raises: 10 reps;Limitations Heel Raises Limitations: up on both down on R Terminal Knee Extension: 10 reps Lateral Step Up: 10 reps;Right;Hand Hold: 2;Step Height: 4" Forward Step Up: 10 reps;Right;Step Height: 4";Hand Hold: 0 Functional Squat: 15 reps Rocker Board: 2 minutes Supine Short Arc Quad Sets: 15 reps;Limitations Short Arc Quad Sets Limitations: 15" holds Heel Slides: Limitations;10 reps Heel Slides Limitations: 5" holds Other Supine Knee Exercises: PROM for ext x 30 sec. Sidelying Hip ABduction:  (time) Hip ADduction:  (time) Prone  Hamstring Curl:  (time) Hip Extension:  (time)   Physical Therapy Assessment and Plan PT Assessment and Plan Clinical Impression Statement: Pt completes all therex well. VC's required for proper form with standing TKE. Pt is without complaint throughout session. Began SAQ with 5" holds to improve quad strength and terminal range of ext. PT Plan: Continue per PT POC. Begin forward lunge with leg on 4" step (step against wall) next session.     Problem List Patient Active Problem List  Diagnoses  . OA (osteoarthritis) of knee  . Postop Hyponatremia  . Stiffness of knee joint, right  . Difficulty in walking  . Pain due to total knee replacement    PT - End of Session Activity Tolerance: Patient tolerated  treatment well General Behavior During Session: [facility] for tasks performed Cognition: [facility] for tasks performed   Seth Bake, PTA 10/17/2011, 5:03 PM

## 2011-10-19 ENCOUNTER — Ambulatory Visit (HOSPITAL_COMMUNITY)
Admission: RE | Admit: 2011-10-19 | Discharge: 2011-10-19 | Disposition: A | Discharge status: Home / Self Care | Payer: Medicare Other | Source: Ambulatory Visit | Attending: Orthopedic Surgery | Admitting: Orthopedic Surgery

## 2011-10-19 DIAGNOSIS — IMO0001 Reserved for inherently not codable concepts without codable children: Secondary | ICD-10-CM | POA: Diagnosis not present

## 2011-10-19 DIAGNOSIS — M25569 Pain in unspecified knee: Secondary | ICD-10-CM | POA: Diagnosis not present

## 2011-10-19 DIAGNOSIS — R262 Difficulty in walking, not elsewhere classified: Secondary | ICD-10-CM | POA: Diagnosis not present

## 2011-10-19 DIAGNOSIS — M6281 Muscle weakness (generalized): Secondary | ICD-10-CM | POA: Diagnosis not present

## 2011-10-19 NOTE — Progress Notes (Signed)
Physical Therapy Treatment Patient Details  Name: Paul Lam MRN: [id number] Date of Birth: 09-Nov-1940  Today's Date: 10/19/2011 Time: 1191-4782 PT Time Calculation (min): 69 min Visit#: 4  of 12   Re-eval: 11/10/11 Charges: therex 35', manual 10', ice 10'    Authorization: medicare needs G-code at 10   Authorization Time Period:    Authorization Visit#: 4  of 10    Subjective: Symptoms/Limitations Symptoms: Pt. reports having pain when meds wear off.  States the lowest his pain gets is 5/10. Pain Assessment Currently in Pain?: Yes Pain Score:   5 Pain Location: Knee Pain Orientation: Right   Exercise/Treatments Aerobic Stationary Bike: 6'@2 .0 seat 11 Standing Heel Raises: 15 reps Heel Raises Limitations: Toeraises 15 reps Lateral Step Up: 15 reps;Step Height: 4";Step Height: 2" Forward Step Up: 15 reps;Step Height: 4";Hand Hold: 1 Functional Squat: 15 reps Rocker Board: 2 minutes Supine Quad Sets: 15 reps Short Arc Quad Sets: 15 reps;Limitations Short Arc Quad Sets Limitations: 5"holds Heel Slides: 15 reps Heel Slides Limitations: 5" holds Other Supine Knee Exercises: PROM for ext x 30 sec. Sidelying Hip ABduction: 15 reps Prone  Hamstring Curl: 15 reps Hamstring Curl Limitations: 3# Hip Extension: 15 reps Other Prone Exercises: PROM for flex 3x30   Modalities Modalities: Cryotherapy Manual Therapy Manual Therapy: Myofascial release Myofascial Release: inferior anterior knee to decrease adhesions. Cryotherapy Number Minutes Cryotherapy: 10 Minutes Cryotherapy Location: Knee Type of Cryotherapy: Ice pack  Physical Therapy Assessment and Plan PT Assessment and Plan Clinical Impression Statement: Pt. able to complete all exercises with minimal cues.  Pt. asking about multipodus boots to keep hip in neutral while sleeping; suggested pt. ask MD if appropriate.  Suggested pt. purchase compression hose for his B LE edema.  Added MFR to decrease adhesions  inferior knee with good results. PT Plan: Begin lunge on 4" step to increase flexion, prone wall hangs to increase extension.     Problem List Patient Active Problem List  Diagnoses  . OA (osteoarthritis) of knee  . Postop Hyponatremia  . Stiffness of knee joint, right  . Difficulty in walking  . Pain due to total knee replacement    PT - End of Session Activity Tolerance: Patient tolerated treatment well General Behavior During Session: [facility] for tasks performed Cognition: [facility] for tasks performed   Ekaterini Capitano B. Bascom Levels, PTA 10/19/2011, 4:21 PM

## 2011-10-20 ENCOUNTER — Ambulatory Visit (HOSPITAL_COMMUNITY)
Admission: RE | Admit: 2011-10-20 | Discharge: 2011-10-20 | Disposition: A | Discharge status: Home / Self Care | Payer: Medicare Other | Source: Ambulatory Visit | Attending: Orthopedic Surgery | Admitting: Orthopedic Surgery

## 2011-10-20 DIAGNOSIS — M6281 Muscle weakness (generalized): Secondary | ICD-10-CM | POA: Diagnosis not present

## 2011-10-20 DIAGNOSIS — R262 Difficulty in walking, not elsewhere classified: Secondary | ICD-10-CM | POA: Diagnosis not present

## 2011-10-20 DIAGNOSIS — M25569 Pain in unspecified knee: Secondary | ICD-10-CM | POA: Diagnosis not present

## 2011-10-20 DIAGNOSIS — IMO0001 Reserved for inherently not codable concepts without codable children: Secondary | ICD-10-CM | POA: Diagnosis not present

## 2011-10-20 NOTE — Progress Notes (Signed)
Physical Therapy Treatment Patient Details  Name: Paul Lam MRN: [id number] Date of Birth: [birth year]  Today's Date: 10/20/2011 Time: 9629-5284 PT Time Calculation (min): 76 min Visit#: 5  of 12   Re-eval: 11/10/11 Charges:  therex 36', Manual 12'  , ice 10'     Subjective: Symptoms/Limitations Symptoms: Pt. reports pain is up today; was a 7/10 and took pain meds so now 4/10. Pain Assessment Currently in Pain?: Yes Pain Score:   4   Exercise/Treatments Aerobic Stationary Bike: 10'@2 .5, seat 11 Standing Heel Raises: 15 reps Heel Raises Limitations: Toeraises 15 reps Forward Lunges: 5 reps;Limitations Forward Lunges Limitations: onto 8" step for stretch 20" holds Lateral Step Up: 15 reps;Step Height: 4";Step Height: 2" Forward Step Up: 15 reps;Step Height: 4";Hand Hold: 1 Functional Squat: 15 reps Rocker Board: 2 minutes Supine Quad Sets: 15 reps Short Arc Quad Sets Limitations: time Heel Slides: 10 reps;Limitations Heel Slides Limitations: prior to PROM Knee Extension: PROM;3 sets Knee Flexion: PROM;3 sets Prone  Hamstring Curl Limitations: time Prone Knee Hang: 2 minutes Other Prone Exercises: PROM for flex 3x30 Modalities Modalities: Cryotherapy Manual Therapy Manual Therapy: Myofascial release Myofascial Release: inferior anterior knee to decrease adhesions Cryotherapy Number Minutes Cryotherapy: 10 Minutes Cryotherapy Location: Knee Type of Cryotherapy: Ice pack  Physical Therapy Assessment and Plan PT Assessment and Plan Clinical Impression Statement: Continues to have extreme tightness/adhesions inferior anterior knee.  Good results following MFR and able to achieve 95 degrees AROM, 105 degrees PROM.  Added prone knee hang to increase extension and forward lunge stretch with 8" step to increase flexion.  PT Plan: Continue to progress ROM.    Problem List Patient Active Problem List  Diagnoses  . OA (osteoarthritis) of knee  . Postop Hyponatremia   . Stiffness of knee joint, right  . Difficulty in walking  . Pain due to total knee replacement    PT - End of Session Activity Tolerance: Patient tolerated treatment well General Behavior During Session: [facility] for tasks performed Cognition: [facility] for tasks performed   [name] B. Bascom Levels, PTA 10/20/2011, 4:04 PM

## 2011-10-23 DIAGNOSIS — W57XXXA Bitten or stung by nonvenomous insect and other nonvenomous arthropods, initial encounter: Secondary | ICD-10-CM | POA: Diagnosis not present

## 2011-10-23 DIAGNOSIS — B356 Tinea cruris: Secondary | ICD-10-CM | POA: Diagnosis not present

## 2011-10-23 DIAGNOSIS — T148 Other injury of unspecified body region: Secondary | ICD-10-CM | POA: Diagnosis not present

## 2011-10-24 ENCOUNTER — Ambulatory Visit (HOSPITAL_COMMUNITY)
Admission: RE | Admit: 2011-10-24 | Discharge: 2011-10-24 | Disposition: A | Discharge status: Home / Self Care | Payer: Medicare Other | Source: Ambulatory Visit | Attending: Orthopedic Surgery | Admitting: Orthopedic Surgery

## 2011-10-24 DIAGNOSIS — Z85828 Personal history of other malignant neoplasm of skin: Secondary | ICD-10-CM | POA: Diagnosis not present

## 2011-10-24 DIAGNOSIS — L570 Actinic keratosis: Secondary | ICD-10-CM | POA: Diagnosis not present

## 2011-10-24 DIAGNOSIS — M25569 Pain in unspecified knee: Secondary | ICD-10-CM | POA: Diagnosis not present

## 2011-10-24 DIAGNOSIS — M6281 Muscle weakness (generalized): Secondary | ICD-10-CM | POA: Diagnosis not present

## 2011-10-24 DIAGNOSIS — R262 Difficulty in walking, not elsewhere classified: Secondary | ICD-10-CM | POA: Diagnosis not present

## 2011-10-24 DIAGNOSIS — IMO0001 Reserved for inherently not codable concepts without codable children: Secondary | ICD-10-CM | POA: Diagnosis not present

## 2011-10-24 NOTE — Progress Notes (Signed)
Physical Therapy Treatment Patient Details  Name: Paul Lam MRN: [id number] Date of Birth: 08/31/1940  Today's Date: 10/24/2011 Time: 7564-3329 PT Time Calculation (min): 63 min Visit#: 6  of 12   Re-eval: 11/10/11 Charges: therex x 30' Manual x 8' Ice x 10'   Authorization: medicare needs G-code at 10   Authorization Time Period:    Authorization Visit#: 4  of 10    Subjective: Symptoms/Limitations Symptoms: Pt states that he is riding his stationary bike at home. Pain Assessment Currently in Pain?: Yes Pain Score:   3 Pain Location: Knee Pain Orientation: Right  Exercise/Treatments Aerobic Stationary Bike: 8'@2 .5 seat 12 Standing Heel Raises: 15 reps Heel Raises Limitations: Toeraises 15 reps Forward Lunges: 5 reps;Limitations Forward Lunges Limitations: onto 8" step for stretch 20" holds Lateral Step Up: Step Height: 4";Step Height: 2";20 reps Forward Step Up: Step Height: 4";Hand Hold: 1;20 reps Functional Squat: 15 reps Rocker Board: 2 minutes Supine Short Arc Quad Sets: 15 reps;Limitations Short Arc Quad Sets Limitations: 5" holds Heel Slides: 10 reps;Limitations Heel Slides Limitations: prior to PROM   Modalities Modalities: Cryotherapy Manual Therapy Manual Therapy: Other (comment) Myofascial Release: inferior anterior knee to decrease adhesions completed in between PROM. Other Manual Therapy: PROM to increae R knee ext/flex Cryotherapy Number Minutes Cryotherapy: 10 Minutes Cryotherapy Location: Knee (Right) Type of Cryotherapy: Ice pack  Physical Therapy Assessment and Plan PT Assessment and Plan Clinical Impression Statement: Pt completes therex well. MFR completed to R knee in between PROM. Pt appears to receive relief from MFR. Pt continues to progress well. Pt is without complaint throughout session. Ice applied to knee at end of session to limit pain and swelling. Pt reports no increase in pain at end of session. PT Plan: Continue to  progress strength and ROM per PT POC.     Problem List Patient Active Problem List  Diagnoses  . OA (osteoarthritis) of knee  . Postop Hyponatremia  . Stiffness of knee joint, right  . Difficulty in walking  . Pain due to total knee replacement    PT - End of Session Activity Tolerance: Patient tolerated treatment well General Behavior During Session: [facility] for tasks performed Cognition: [facility] for tasks performed    Seth Bake, PTA 10/24/2011, 5:22 PM

## 2011-10-26 ENCOUNTER — Ambulatory Visit (HOSPITAL_COMMUNITY)
Admission: RE | Admit: 2011-10-26 | Discharge: 2011-10-26 | Disposition: A | Discharge status: Home / Self Care | Payer: Medicare Other | Source: Ambulatory Visit | Attending: Orthopedic Surgery | Admitting: Orthopedic Surgery

## 2011-10-26 DIAGNOSIS — IMO0001 Reserved for inherently not codable concepts without codable children: Secondary | ICD-10-CM | POA: Diagnosis not present

## 2011-10-26 DIAGNOSIS — R262 Difficulty in walking, not elsewhere classified: Secondary | ICD-10-CM | POA: Diagnosis not present

## 2011-10-26 DIAGNOSIS — M25569 Pain in unspecified knee: Secondary | ICD-10-CM | POA: Diagnosis not present

## 2011-10-26 DIAGNOSIS — M6281 Muscle weakness (generalized): Secondary | ICD-10-CM | POA: Diagnosis not present

## 2011-10-26 NOTE — Progress Notes (Signed)
Physical Therapy Treatment Patient Details  Name: LINDA BIEHN MRN: [id number] Date of Birth: 07-20-1940  Today's Date: 10/26/2011 Time: 8295-6213 PT Time Calculation (min): 65 min  Visit#: 7  of 12   Re-eval: 11/10/11 Charges: Therex x 30' MMT x 1 ROMM x 1  Authorization: medicare needs G-code at 10    Subjective: Symptoms/Limitations Symptoms: Pt states that his pain increases at night. Pain Assessment Currently in Pain?: Yes Pain Score:   3 Pain Location: Knee Pain Orientation: Right   Exercise/Treatments Aerobic Stationary Bike: 8'@2 .5 seat 12 Standing Heel Raises: 20 reps Heel Raises Limitations: Toeraises 20 reps Lateral Step Up: Step Height: 4";Step Height: 2";20 reps Forward Step Up: Step Height: 4";Hand Hold: 1;20 reps Step Down: 10 reps;Right;Hand Hold: 1;Step Height: 4" Functional Squat: 15 reps Rocker Board: 2 minutes Supine Heel Slides: 10 reps;Limitations Heel Slides Limitations: prior to PROM Knee Extension: PROM;3 sets Knee Flexion: PROM;3 sets  Physical Therapy Assessment and Plan PT Assessment and Plan Clinical Impression Statement: Pt continues to progress well. Pt presents with improvements in strength and ROM. Began forward step down to improve eccentric quad strength. Progress note sent to MD prior to MD appointment. Ice applied at end of session to limit pain and swelling. Pt is without complaint throughout session. PT Plan: Continue to progress strength and ROM per PT POC.      Problem List Patient Active Problem List  Diagnoses  . OA (osteoarthritis) of knee  . Postop Hyponatremia  . Stiffness of knee joint, right  . Difficulty in walking  . Pain due to total knee replacement    PT - End of Session Activity Tolerance: Patient tolerated treatment well General Behavior During Session: [facility] for tasks performed Cognition: [facility] for tasks performed   Seth Bake, PTA 10/26/2011, 4:33 PM

## 2011-10-27 ENCOUNTER — Ambulatory Visit (HOSPITAL_COMMUNITY): Payer: Medicare Other | Admitting: *Deleted

## 2011-10-28 ENCOUNTER — Ambulatory Visit (HOSPITAL_COMMUNITY)
Admission: RE | Admit: 2011-10-28 | Discharge: 2011-10-28 | Disposition: A | Discharge status: Home / Self Care | Payer: Medicare Other | Source: Ambulatory Visit | Attending: Physical Therapy | Admitting: Physical Therapy

## 2011-10-28 DIAGNOSIS — R262 Difficulty in walking, not elsewhere classified: Secondary | ICD-10-CM | POA: Diagnosis not present

## 2011-10-28 DIAGNOSIS — M25569 Pain in unspecified knee: Secondary | ICD-10-CM | POA: Diagnosis not present

## 2011-10-28 DIAGNOSIS — IMO0001 Reserved for inherently not codable concepts without codable children: Secondary | ICD-10-CM | POA: Diagnosis not present

## 2011-10-28 DIAGNOSIS — M6281 Muscle weakness (generalized): Secondary | ICD-10-CM | POA: Diagnosis not present

## 2011-10-28 NOTE — Progress Notes (Signed)
Physical Therapy Treatment Patient Details  Name: Paul Lam MRN: [id number] Date of Birth: 1940-08-26  Today's Date: 10/28/2011 Time: 0930-1034 PT Time Calculation (min): 64 min  Visit#: 8  of 12   Re-eval: 11/10/11    Authorization:    Authorization Time Period:    Authorization Visit#:   of     Subjective: Symptoms/Limitations Symptoms: Pt states that he wants to be able to get up on his tractor.  Strength is doing well. Pain Assessment Currently in Pain?: Yes (night pain is still difficult) Pain Score:   3 Pain Location: Knee Pain Orientation: Right  Exercise/Treatments   Stretches Active Hamstring Stretch: 3 reps;30 seconds Passive Hamstring Stretch: 2 reps;60 seconds Quad Stretch: 3 reps;30 seconds Aerobic Stationary Bike: 8'@2 .5 seat 12   Supine Quad Sets: 15 reps Heel Slides: 15 reps Heel Slides Limitations: prior to PROM Terminal Knee Extension: 15 reps Knee Extension: PROM Knee Flexion: PROM;3 sets Other Supine Knee Exercises: pt foot of shoulder pull in/push out x15 Other Supine Knee Exercises: jt mob tib/femoral/ fib/femoral   Prone  Hamstring Curl: 10 reps Contract/Relax to Increase Flexion:  (10x) Other Prone Exercises: PROM for flex with distraction Other Prone Exercises: terminal flex x 10   Modalities Modalities: Cryotherapy Manual Therapy Manual Therapy: Edema management Edema Management: retromassage.  Physical Therapy Assessment and Plan PT Assessment and Plan Clinical Impression Statement: Pt strength is doing well.  Concentrated treatment on ROM this treatment.  Ice to decreased swelling Rehab Potential: Good PT Frequency: Min 3X/week PT Duration: 4 weeks PT Plan: Treatment to focus on ROM for the next week with goal of ROM to be -8 to 115 to be able to step into tractor.    Goals    Problem List Patient Active Problem List  Diagnoses  . OA (osteoarthritis) of knee  . Postop Hyponatremia  . Stiffness of knee joint,  right  . Difficulty in walking  . Pain due to total knee replacement    PT - End of Session Activity Tolerance: Patient tolerated treatment well General Behavior During Session: [facility] for tasks performed Cognition: [facility] for tasks performed PT Plan of Care Consulted and Agree with Plan of Care: Patient Charge:  There ex x 3; IP x 1 GP No functional reporting required  [place],CINDY 10/28/2011, 10:31 AM

## 2011-11-01 ENCOUNTER — Ambulatory Visit (HOSPITAL_COMMUNITY)
Admission: RE | Admit: 2011-11-01 | Discharge: 2011-11-01 | Disposition: A | Discharge status: Home / Self Care | Payer: Medicare Other | Source: Ambulatory Visit | Attending: Orthopedic Surgery | Admitting: Orthopedic Surgery

## 2011-11-01 DIAGNOSIS — R262 Difficulty in walking, not elsewhere classified: Secondary | ICD-10-CM | POA: Diagnosis not present

## 2011-11-01 DIAGNOSIS — M6281 Muscle weakness (generalized): Secondary | ICD-10-CM | POA: Diagnosis not present

## 2011-11-01 DIAGNOSIS — IMO0001 Reserved for inherently not codable concepts without codable children: Secondary | ICD-10-CM | POA: Diagnosis not present

## 2011-11-01 DIAGNOSIS — M25569 Pain in unspecified knee: Secondary | ICD-10-CM | POA: Diagnosis not present

## 2011-11-01 NOTE — Progress Notes (Signed)
Physical Therapy Treatment Patient Details  Name: AMROM ORE MRN: [id number] Date of Birth: 09/13/1940  Today's Date: 11/01/2011 Time: 1515-1610 PT Time Calculation (min): 55 min Visit#: 9  of 12   Re-eval: 11/10/11 Charges:  therex 36', manual 15'   Authorization: medicare needs G-code at 10  Authorization Time Period:    Authorization Visit#: 9  of 10    Subjective: Symptoms/Limitations Symptoms: Pt reported he is pain free, took pain meds 30 minutes prior this apt.  Pt stated most pain arrives around 7 o'clock pm and continues to wake at night. Pain Assessment Currently in Pain?: No/denies (still painful at night)   Exercise/Treatments Stretches Active Hamstring Stretch: 3 reps;30 seconds;Limitations Active Hamstring Stretch Limitations: with rope Knee: Self-Stretch to increase Flexion:  (chair stretch 2X1' hold) Aerobic Stationary Bike: 8'@3 .0 seat 12 Supine Quad Sets: 15 reps Heel Slides: 15 reps Knee Extension: PROM Other Supine Knee Exercises: jt mob tib/femoral/ fib/femoral Prone  Hamstring Curl: 15 reps Other Prone Exercises: PROM for flex with distraction Other Prone Exercises: terminal flex x 10   Manual Therapy Manual Therapy: Myofascial release Myofascial Release: Lateral and inferior  knee to decrease adhesions;  completed prior to PROM  Physical Therapy Assessment and Plan PT Assessment and Plan Clinical Impression Statement: Pt. ambulated into clinic without SPC today; states he is leaving it behind more now.  Continued concentration on ROM this treatment. Added chair stretch to improve flexion.   MFR to anterior knee adhesions with good results PT Plan: Per PT, continue treatment to focus on ROM for the next week with goal of ROM to be -8 to 115 to be able to step into tractor.  Re-assign G codes next visit.     Problem List Patient Active Problem List  Diagnoses  . OA (osteoarthritis) of knee  . Postop Hyponatremia  . Stiffness of knee  joint, right  . Difficulty in walking  . Pain due to total knee replacement    PT - End of Session Activity Tolerance: Patient tolerated treatment well General Behavior During Session: [facility] for tasks performed Cognition: [facility] for tasks performed  GP No functional reporting required  Lurena Nida, PTA/CLT 11/01/2011, 4:17 PM

## 2011-11-02 ENCOUNTER — Ambulatory Visit (HOSPITAL_COMMUNITY)
Admission: RE | Admit: 2011-11-02 | Discharge: 2011-11-02 | Disposition: A | Discharge status: Home / Self Care | Payer: Medicare Other | Source: Ambulatory Visit | Attending: Family Medicine | Admitting: Family Medicine

## 2011-11-02 DIAGNOSIS — M6281 Muscle weakness (generalized): Secondary | ICD-10-CM | POA: Diagnosis not present

## 2011-11-02 DIAGNOSIS — IMO0001 Reserved for inherently not codable concepts without codable children: Secondary | ICD-10-CM | POA: Diagnosis not present

## 2011-11-02 DIAGNOSIS — R262 Difficulty in walking, not elsewhere classified: Secondary | ICD-10-CM | POA: Diagnosis not present

## 2011-11-02 DIAGNOSIS — M25569 Pain in unspecified knee: Secondary | ICD-10-CM | POA: Diagnosis not present

## 2011-11-02 NOTE — Progress Notes (Signed)
Physical Therapy Treatment Patient Details  Name: LUCIAN BASWELL MRN: [id number] Date of Birth: August 29, 1940  Today's Date: 11/02/2011 Time: 1514 (started by PT (LM))-1613 (Finished with PTA (RS)) PT Time Calculation (min): 59 min Charges: 10' manual, 5' Self Care, 23' TE, 1 ice Visit#: 10  of 12   Re-eval: 12/02/11    Authorization: MEDICARE  Authorization Time Period: G-code: Cuurent: CJ Goal: CI  Authorization Visit#: 10  of 20    Subjective: Symptoms/Limitations Symptoms: Pt reports that he is doing better overall.  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  He continues to be limited by his overall ROM and pain.  Pain Assessment Currently in Pain?: Yes Pain Score:   3 (w/pain medication) Pain Location: Knee Pain Orientation: Right  LEFS: 63/80   Exercise/Treatments Aerobic Stationary Bike: 10' 3.0 Seat 10 for strengthening and ROM Standing Lateral Step Up: 20 reps;Hand Hold: 2;Step Height: 4";Right Forward Step Up: 20 reps;Step Height: 4";Right Step Down: 10 reps;Right;Hand Hold: 1;Step Height: 4" Functional Squat: 20 reps Supine Short Arc Quad Sets: 15 reps;AROM Short Arc Quad Sets Limitations: 5", 2# Knee Extension: PROM Other Supine Knee Exercises: jt mob tib/femoral/ fib/femoral  Manual Therapy Manual Therapy: Joint mobilization Joint Mobilization: Grade II-III to R knee joint to improve knee flexion and extension, Grade II to fibular head. w/STM after to decrease pain. PROM after w/holds to improve flexion and extension.  Cryotherapy Number Minutes Cryotherapy: 10 Minutes Cryotherapy Location: Knee Type of Cryotherapy: Ice pack  Physical Therapy Assessment and Plan PT Assessment and Plan Clinical Impression Statement: G-code updated today.  Continues to show improved A/PROM overall with manual treatment.  He demonstrates improved form with functional squats.  PT Frequency: Min 3X/week PT Duration: 4 weeks    Goals     Problem List Patient Active Problem List  Diagnoses  . OA (osteoarthritis) of knee  . Postop Hyponatremia  . Stiffness of knee joint, right  . Difficulty in walking  . 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   Nike Southers 11/02/2011, 4:11 PM

## 2011-11-03 ENCOUNTER — Ambulatory Visit (HOSPITAL_COMMUNITY)
Admission: RE | Admit: 2011-11-03 | Discharge: 2011-11-03 | Disposition: A | Discharge status: Home / Self Care | Payer: Medicare Other | Source: Ambulatory Visit | Attending: Orthopedic Surgery | Admitting: Orthopedic Surgery

## 2011-11-03 DIAGNOSIS — M6281 Muscle weakness (generalized): Secondary | ICD-10-CM | POA: Diagnosis not present

## 2011-11-03 DIAGNOSIS — R262 Difficulty in walking, not elsewhere classified: Secondary | ICD-10-CM | POA: Diagnosis not present

## 2011-11-03 DIAGNOSIS — IMO0001 Reserved for inherently not codable concepts without codable children: Secondary | ICD-10-CM | POA: Diagnosis not present

## 2011-11-03 DIAGNOSIS — M25569 Pain in unspecified knee: Secondary | ICD-10-CM | POA: Diagnosis not present

## 2011-11-03 NOTE — Progress Notes (Signed)
Physical Therapy Treatment Patient Details  Name: Paul Lam MRN: [id number] Date of Birth: 1941-03-11  Today's Date: 11/03/2011 Time: 5784-6962 PT Time Calculation (min): 63 min  Visit#: 11  of 24   Re-eval: 12/02/11 Charges: Manual x 10' Therex x 30' Ice x 10'    Subjective: Pain Assessment Currently in Pain?: Yes Pain Score:   4 Pain Orientation: Right   Exercise/Treatments Aerobic Stationary Bike: 8' 3.0 Seat 12 for strengthening and ROM Standing Heel Raises: 20 reps Heel Raises Limitations: Toeraises 20 reps Lateral Step Up: 20 reps;Hand Hold: 2;Step Height: 4";Right Forward Step Up: 20 reps;Step Height: 4";Right Step Down: 15 reps;Right;Hand Hold: 1;Step Height: 4" Wall Squat: 10 reps;5 seconds Supine Heel Slides: 10 reps Knee Extension: PROM Knee Flexion: PROM Other Supine Knee Exercises: jt mob tib/femoral/ fib/femoral   Manual Therapy Manual Therapy: Joint mobilization Joint Mobilization: Grade II-III to R knee joint to improve knee flexion and extension. PROM after w/holds to improve flexion and extension. Cryotherapy Cryotherapy Location: Knee  Physical Therapy Assessment and Plan PT Assessment and Plan Clinical Impression Statement: Pt continues to progress well. Pt displays improved quad control with step-ups. Began wall squats to improve LE strength and stability. Pt is without complaint throughout session. Ice applied to R knee at end of session to limit pain and swelling. PT Plan: Continue to progress strength and ROM per PT POC.     Problem List Patient Active Problem List  Diagnoses  . OA (osteoarthritis) of knee  . Postop Hyponatremia  . Stiffness of knee joint, right  . Unable to ambulate  . Pain due to total knee replacement    PT - End of Session Activity Tolerance: Patient tolerated treatment well General Behavior During Session: [facility] for tasks performed Cognition: [facility] for tasks performed  Seth Bake, PTA 11/03/2011, 6:15  PM

## 2011-11-07 ENCOUNTER — Ambulatory Visit (HOSPITAL_COMMUNITY)
Admission: RE | Admit: 2011-11-07 | Discharge: 2011-11-07 | Disposition: A | Discharge status: Home / Self Care | Payer: Medicare Other | Source: Ambulatory Visit | Attending: Family Medicine | Admitting: Family Medicine

## 2011-11-07 DIAGNOSIS — R262 Difficulty in walking, not elsewhere classified: Secondary | ICD-10-CM | POA: Diagnosis not present

## 2011-11-07 DIAGNOSIS — M25569 Pain in unspecified knee: Secondary | ICD-10-CM | POA: Diagnosis not present

## 2011-11-07 DIAGNOSIS — IMO0001 Reserved for inherently not codable concepts without codable children: Secondary | ICD-10-CM | POA: Insufficient documentation

## 2011-11-07 DIAGNOSIS — M6281 Muscle weakness (generalized): Secondary | ICD-10-CM | POA: Diagnosis not present

## 2011-11-07 NOTE — Progress Notes (Signed)
Physical Therapy Treatment Patient Details  Name: Paul Lam MRN: [id number] Date of Birth: 1940-11-26  Today's Date: 11/07/2011 Time: 5284-1324 PT Time Calculation (min): 61 min  Visit#: 12  of 24   Re-eval: 12/02/11 Charges: Therex x 30' Manual x 10' Ice x 10'  Authorization: Medicare  Authorization Time Period: G-code: Cuurent: CJ Goal: CI  Authorization Visit#: 11  of 20    Subjective: Symptoms/Limitations Symptoms: My pain is highest during the night. Pain Assessment Currently in Pain?: No/denies Pain Score:   4 Pain Location: Knee Pain Orientation: Right   Exercise/Treatments Aerobic Stationary Bike: 8' 3.0 Seat 12 for strengthening and ROM Standing Terminal Knee Extension: 15 reps;Theraband Theraband Level (Terminal Knee Extension): Level 4 (Blue) Lateral Step Up: 20 reps;Hand Hold: 2;Step Height: 4";Right Forward Step Up: 20 reps;Step Height: 4";Right Step Down: 15 reps;Right;Hand Hold: 1;Step Height: 4" Wall Squat: 10 reps;5 seconds Supine Heel Slides: 10 reps Knee Extension: PROM Knee Flexion: PROM Other Supine Knee Exercises: jt mob tib/femoral/ fib/femoral   Manual Therapy Manual Therapy: Joint mobilization Joint Mobilization: Grade II-III to R knee joint to improve knee flexion and extension. PROM after w/holds to improve flexion and extension. Cryotherapy Number Minutes Cryotherapy: 10 Minutes Cryotherapy Location: Knee (Right) Type of Cryotherapy: Ice pack  Physical Therapy Assessment and Plan PT Assessment and Plan Clinical Impression Statement: Pt completes step ups with increased ease this session. Pt tolerates higher seat number on bike without difficulty. Pt is without complaint throughout session. Pt reports pain decrease to 3/10 at end of session. PT Plan: Continue to progress strength and ROM per PT POC.      Problem List Patient Active Problem List  Diagnoses  . OA (osteoarthritis) of knee  . Postop Hyponatremia  . Stiffness of  knee joint, right  . Unable to ambulate  . Pain due to total knee replacement    PT - End of Session Activity Tolerance: Patient tolerated treatment well General Behavior During Session: [facility] for tasks performed Cognition: [facility] for tasks performed   Seth Bake, PTA 11/07/2011, 5:18 PM

## 2011-11-09 ENCOUNTER — Ambulatory Visit (HOSPITAL_COMMUNITY)
Admission: RE | Admit: 2011-11-09 | Discharge: 2011-11-09 | Disposition: A | Discharge status: Home / Self Care | Payer: Medicare Other | Source: Ambulatory Visit | Attending: Family Medicine | Admitting: Family Medicine

## 2011-11-09 NOTE — Progress Notes (Signed)
Physical Therapy Treatment Patient Details  Name: Paul Lam MRN: [id number] Date of Birth: 11-17-1940  Today's Date: 11/09/2011 Time: 4098-1191 PT Time Calculation (min): 70 min  Visit#: 13  of 24   Re-eval: 12/02/11  Charge: therex 58 Ice 10 min  Authorization: Medicare  Authorization Time Period: G-code: Cuurent: CJ Goal: CI   Authorization Visit#: 12  of 20    Subjective: Symptoms/Limitations Symptoms: Pain worse at night around 5:30-6 I need to take pain meds then and also around 1 in the morning.  I'm not hurting right now, had meds this morning. Pain Assessment Currently in Pain?: No/denies  Objective:   Exercise/Treatments Aerobic Stationary Bike: 8' 3.0 Seat 11 for strengthening and ROM Standing Terminal Knee Extension: 15 reps;Theraband Theraband Level (Terminal Knee Extension): Level 4 (Blue) Lateral Step Up: Right;20 reps;Hand Hold: 1;Step Height: 4" Forward Step Up: Right;20 reps;Hand Hold: 1;Step Height: 4" Step Down: 15 reps;Right;Hand Hold: 1;Step Height: 4" Wall Squat: 10 reps;5 seconds Supine Heel Slides: 10 reps Knee Extension: PROM Knee Flexion: PROM Other Supine Knee Exercises: jt mob tib/femoral/ fib/femoral    Physical Therapy Assessment and Plan PT Assessment and Plan Clinical Impression Statement: A/PROM improving with better mechanics with TKE following demonstration.  Pt able to complete step ups with increased ease, still presents weak eccentric control descending stairs.  Total session complete with no c/o increased pain. PT Plan: Continue to progress strength and ROM per PT POC    Goals    Problem List Patient Active Problem List  Diagnoses  . OA (osteoarthritis) of knee  . Postop Hyponatremia  . Stiffness of knee joint, right  . Unable to ambulate  . Pain due to total knee replacement    PT - End of Session Activity Tolerance: Patient tolerated treatment well General Behavior During Session: [facility] for tasks  performed Cognition: [facility] for tasks performed  GP No functional reporting required  Juel Burrow, PTA 11/09/2011, 3:29 PM

## 2011-11-11 ENCOUNTER — Ambulatory Visit (HOSPITAL_COMMUNITY)
Admission: RE | Admit: 2011-11-11 | Discharge: 2011-11-11 | Disposition: A | Discharge status: Home / Self Care | Payer: Medicare Other | Source: Ambulatory Visit | Attending: Orthopedic Surgery | Admitting: Orthopedic Surgery

## 2011-11-11 DIAGNOSIS — R262 Difficulty in walking, not elsewhere classified: Secondary | ICD-10-CM

## 2011-11-11 DIAGNOSIS — Z96659 Presence of unspecified artificial knee joint: Secondary | ICD-10-CM

## 2011-11-11 DIAGNOSIS — T8484XA Pain due to internal orthopedic prosthetic devices, implants and grafts, initial encounter: Secondary | ICD-10-CM

## 2011-11-11 NOTE — Progress Notes (Signed)
Physical Therapy Treatment Patient Details  Name: LUCAS WINOGRAD MRN: [id number] Date of Birth: 11/19/1940  Today's Date: 11/11/2011 Time: 1107-1200 PT Time Calculation (min): 53 min  Visit#: 14  of 24   Re-eval: 12/02/11    Authorization:    Authorization Time Period: G code current CJ Goal CI  Authorization Visit#:   of     Subjective: Symptoms/Limitations Symptoms: Pt doing well.  I still have some strength deficits.    Exercise/Treatments     Stretches Lobbyist: 3 reps;30 seconds Aerobic Stationary Bike: 8/ 3.0 ended with seat at 10   Standing Lateral Step Up: Right;10 reps;Step Height: 6" Forward Step Up: Right;15 reps;Step Height: 6" Step Down: Right;15 reps;Hand Hold: 1 Other Standing Knee Exercises: lift foot onto 12" step 5 x  Seated Long Arc Quad: Right;10 reps Supine Heel Slides: 10 reps Terminal Knee Extension: 15 reps Knee Extension: PROM Other Supine Knee Exercises: Jt mobs Sidelying   Prone  Hip Extension: Right;10 reps Other Prone Exercises: PROM flex    Modalities Modalities: Cryotherapy Manual Therapy Manual Therapy: Joint mobilization Joint Mobilization: Gade III to knee to improve flextion Cryotherapy Number Minutes Cryotherapy: 10 Minutes Cryotherapy Location: Knee  Physical Therapy Assessment and Plan PT Assessment and Plan Clinical Impression Statement: Pt improving with functional ROM added stepping ont 12 box to simulate tractors at home.  Gt improved PT Plan: Progress with concentration on ROM and hip extension add sit to stand next treatment    Goals    Problem List Patient Active Problem List  Diagnoses  . OA (osteoarthritis) of knee  . Postop Hyponatremia  . Stiffness of knee joint, right  . Unable to ambulate  . Pain due to total knee replacement    PT - End of Session Activity Tolerance: Patient tolerated treatment well General Behavior During Session: [facility] for tasks performed Cognition: [facility] for tasks  performed  GP No functional reporting required  Promise Weldin,CINDY 11/11/2011, 11:50 AM

## 2011-11-14 ENCOUNTER — Ambulatory Visit (HOSPITAL_COMMUNITY)
Admission: RE | Admit: 2011-11-14 | Discharge: 2011-11-14 | Disposition: A | Discharge status: Home / Self Care | Payer: Medicare Other | Source: Ambulatory Visit | Attending: Family Medicine | Admitting: Family Medicine

## 2011-11-14 NOTE — Progress Notes (Signed)
Physical Therapy Treatment Patient Details  Name: Paul Lam MRN: [id number] Date of Birth: 12-Mar-1941  Today's Date: 11/14/2011 Time: 1229-1328 PT Time Calculation (min): 59 min CHARGES: Therex 48' 10' Visit#: 15  of 24   Re-eval: 12/02/11    Authorization:    Authorization Time Period:   G code current CJ Goal CI Authorization Visit#:   of     Subjective: Symptoms/Limitations Symptoms: c/o of R knee stiffness. Patient also stated that R leg pain 8/10 wakes him a couple of times each night, he has to take muscle relaxer and hydrocodone to get back to sleep.  Precautions/Restrictions     Exercise/Treatments Mobility/Balance        Stretches Lobbyist: 3 reps;30 seconds Aerobic Stationary Bike: 9/2.5 for (patient wanted to add 2 mins) Machines for Strengthening   Plyometrics   Standing Terminal Knee Extension: 15 reps;Theraband Lateral Step Up: Right;10 reps;Step Height: 6" Forward Step Up: 15 reps;Step Height: 6";Limitations (w/ HH) Step Down: Right;15 reps;Hand Hold: 1 Other Standing Knee Exercises: lift foot onto 12" step x7 Other Standing Knee Exercises: sit<>stand x5 Seated Long Arc Quad: Right;10 reps Supine Knee Extension: PROM Knee Flexion: Right Sidelying   Prone  Hip Extension: 10 reps;Right   Modalities Modalities: Cryotherapy Cryotherapy Number Minutes Cryotherapy: 10 Minutes Cryotherapy Location: Knee (right) Type of Cryotherapy: Ice pack  Physical Therapy Assessment and Plan PT Assessment and Plan Clinical Impression Statement: Pt continues to improve with ROM. Added sit<>stands increase reps next and continue improving functional ROM    Goals    Problem List Patient Active Problem List  Diagnoses  . OA (osteoarthritis) of knee  . Postop Hyponatremia  . Stiffness of knee joint, right  . Unable to ambulate  . Pain due to total knee replacement    PT - End of Session Activity Tolerance: Patient tolerated treatment  well General Behavior During Session: [facility] for tasks performed Cognition: [facility] for tasks performed  GP No functional reporting required  Evon Lopezperez [name] 11/14/2011, 1:26 PM

## 2011-11-16 ENCOUNTER — Ambulatory Visit (HOSPITAL_COMMUNITY)
Admission: RE | Admit: 2011-11-16 | Discharge: 2011-11-16 | Disposition: A | Discharge status: Home / Self Care | Payer: Medicare Other | Source: Ambulatory Visit | Attending: Family Medicine | Admitting: Family Medicine

## 2011-11-16 NOTE — Progress Notes (Signed)
Physical Therapy Treatment Patient Details  Name: Paul Lam MRN: [id number] Date of Birth: 01-07-1941  Today's Date: 11/16/2011 Time: 1325-1430 PT Time Calculation (min): 65 min 45' therex/ 10' ice  Visit#: 16  of 24   Re-eval: 12/02/11    Authorization:    Authorization Time Period: G code current CJ Goal CI   Authorization Visit#: 13  of 20    Subjective: Symptoms/Limitations Symptoms: Patient just having c/o of R knee stiffness not ratable pain, more of a healing pain he states.Patient stated he took hydrocodone before session.  Precautions/Restrictions     Exercise/Treatments Mobility/Balance        Stretches Lobbyist: 3 reps;30 seconds Aerobic Stationary Bike: 9/8' @1 .0 Standing Terminal Knee Extension: 15 reps;Theraband Theraband Level (Terminal Knee Extension): Level 4 (Blue) Lateral Step Up: Right;10 reps;Step Height: 6" Forward Step Up: 15 reps;Step Height: 6";Limitations (HHA) Step Down: Right;15 reps;Hand Hold: 1 Other Standing Knee Exercises: lift foot onto 12" step x7 Other Standing Knee Exercises: sit<>stand x10no UE Seated Long Arc Quad: 2 sets;10 reps Supine Knee Extension: Right Knee Flexion: Right (3x30") Sidelying   Prone  Hip Extension: 15 reps;Right      Physical Therapy Assessment and Plan PT Assessment and Plan Clinical Impression Statement: ROM 12-109 with PROM; Patient tolerated all exercises well and continues to be motivated. Continue to increase functional ROM.    Goals    Problem List Patient Active Problem List  Diagnosis  . OA (osteoarthritis) of knee  . Postop Hyponatremia  . Stiffness of knee joint, right  . Unable to ambulate  . Pain due to total knee replacement    PT - End of Session Activity Tolerance: Patient tolerated treatment well General Behavior During Session: [facility] for tasks performed Cognition: Paul Lam for tasks performed  GP No functional reporting required  Paul Lam  Paul Lam 11/16/2011, 2:27 PM

## 2011-11-18 ENCOUNTER — Ambulatory Visit (HOSPITAL_COMMUNITY)
Admission: RE | Admit: 2011-11-18 | Discharge: 2011-11-18 | Disposition: A | Discharge status: Home / Self Care | Payer: Medicare Other | Source: Ambulatory Visit | Attending: Family Medicine | Admitting: Family Medicine

## 2011-11-18 NOTE — Progress Notes (Signed)
Physical Therapy Treatment Patient Details  Name: Paul Lam MRN: [id number] Date of Birth: 1940/12/24  Today's Date: 11/18/2011 Time: 1104-1208 PT Time Calculation (min): 64 min  Visit#: 17  of 24   Re-eval: 12/02/11 Charges:  Therex x 30' Manual x 10' Ice x 10'   Authorization: Medicare  Authorization Time Period: G code current CJ Goal CI   Authorization Visit#: 17  of 20    Subjective: Symptoms/Limitations Symptoms: Pt states his knee is still very stiff but he believes it is improving. Pain Assessment Currently in Pain?: No/denies Pain Score:   5 Pain Location: Knee Pain Orientation: Right   Exercise/Treatments Aerobic Stationary Bike: 8'@2 .0 seat 7 Standing Terminal Knee Extension: 20 reps;Theraband Theraband Level (Terminal Knee Extension): Level 4 (Blue) Lateral Step Up: 20 reps;Hand Hold: 2;Step Height: 6";Right Forward Step Up: 20 reps;Right;Step Height: 6";Hand Hold: 0 Step Down: 15 reps;Hand Hold: 1;Right;Step Height: 6" Other Standing Knee Exercises: lift foot onto 12" step x10 Supine Knee Extension: PROM Knee Flexion: PROM   Modalities Modalities: Cryotherapy Manual Therapy Joint Mobilization: Grade III to knee to improve flexion Other Manual Therapy: PROM to increae R knee ext/flex Cryotherapy Number Minutes Cryotherapy: 10 Minutes Cryotherapy Location: Knee (Right) Type of Cryotherapy: Ice pack  Physical Therapy Assessment and Plan PT Assessment and Plan Clinical Impression Statement: Pt continues to progress well. Pt presents with improved active hip flexion. ROM limited this session secondary to stiffness. Ice applied to R knee at end of session to limit pain and swelling. Pt reports pain decrease to 2-3/10 at end of session.  PT Plan: Continue to progress per PT POC with focus on increasing ROM.     Problem List Patient Active Problem List  Diagnosis  . OA (osteoarthritis) of knee  . Postop Hyponatremia  . Stiffness of knee joint,  right  . Unable to ambulate  . Pain due to total knee replacement    PT - End of Session Activity Tolerance: Patient tolerated treatment well General Behavior During Session: [facility] for tasks performed Cognition: [facility] for tasks performed   Seth Bake, PTA 11/18/2011, 12:14 PM

## 2011-11-21 ENCOUNTER — Ambulatory Visit (HOSPITAL_COMMUNITY)
Admission: RE | Admit: 2011-11-21 | Discharge: 2011-11-21 | Disposition: A | Discharge status: Home / Self Care | Payer: Medicare Other | Source: Ambulatory Visit | Attending: Family Medicine | Admitting: Family Medicine

## 2011-11-21 NOTE — Progress Notes (Signed)
Physical Therapy Treatment Patient Details  Name: YAMIL OELKE MRN: [id number] Date of Birth: 1941-03-16  Today's Date: 11/21/2011 Time: 1610-1711 PT Time Calculation (min): 61 min  Visit#: 18  of 24   Re-eval: 12/02/11 Charges: Therex x 40' Ice x 10'  Authorization: Medicare  Authorization Time Period: G code current CJ Goal CI   Authorization Visit#: 18  of 20    Subjective: Symptoms/Limitations Symptoms: "I can't wait to get my motion back in this knee!" Pain Assessment Currently in Pain?: Yes Pain Score:   5 Pain Location: Knee Pain Orientation: Right   Exercise/Treatments  Stretches Knee: Self-Stretch to increase Flexion: 3 reps;30 seconds;Limitations Knee: Self-Stretch Limitations: chair stretch Aerobic Stationary Bike: 8'@3 .0 seat 8 Standing Terminal Knee Extension: 20 reps;Theraband Theraband Level (Terminal Knee Extension): Level 4 (Blue) Other Standing Knee Exercises: lift foot onto 12" step x15 Other Standing Knee Exercises: TKF on 6" step 10x5" Supine Heel Slides: 10 reps Knee Extension: PROM Knee Flexion: PROM Other Supine Knee Exercises: Jt mobs   Modalities Modalities: Cryotherapy Manual Therapy Joint Mobilization: Grade II-III to knee to improve flexion  Other Manual Therapy: PROM to increae R knee ext/flex Cryotherapy Number Minutes Cryotherapy: 10 Minutes Cryotherapy Location: Knee (Right) Type of Cryotherapy: Ice pack  Physical Therapy Assessment and Plan PT Assessment and Plan Clinical Impression Statement: Tx focus on increasing ROM. Pt tolerates new exercises well. Pt appears to have improved functional ROM at end of session. Ice applied at end of session to limit pain and swelling. Pt reports pain decrease to 4/10 at end of session. PT Plan: Continue to progress ROM per PT POC.     Problem List Patient Active Problem List  Diagnosis  . OA (osteoarthritis) of knee  . Postop Hyponatremia  . Stiffness of knee joint, right  .  Unable to ambulate  . Pain due to total knee replacement    PT - End of Session Activity Tolerance: Patient tolerated treatment well General Behavior During Session: [facility] for tasks performed Cognition: [facility] for tasks performed  Seth Bake, PTA 11/21/2011, 5:45 PM

## 2011-11-23 ENCOUNTER — Ambulatory Visit (HOSPITAL_COMMUNITY)
Admission: RE | Admit: 2011-11-23 | Discharge: 2011-11-23 | Disposition: A | Discharge status: Home / Self Care | Payer: Medicare Other | Source: Ambulatory Visit | Attending: Family Medicine | Admitting: Family Medicine

## 2011-11-23 NOTE — Progress Notes (Signed)
Physical Therapy Treatment Patient Details  Name: Paul Lam MRN: [id number] Date of Birth: 01/06/1941  Today's Date: 11/23/2011 Time: 4098-1191 PT Time Calculation (min): 74 min Visit#: 19  of 24   Re-eval: 12/02/11 Charges:  therex 32', manual 10', icepack 10'    Authorization: Medicare  Authorization Time Period: G code current CJ Goal CI   Authorization Visit#: 19  of 20    Subjective: Symptoms/Limitations Symptoms: Pt. states his knee is not bothering him today; a little stiff but no pain. Pain Assessment Currently in Pain?: No/denies   Exercise/Treatments Stretches Knee: Self-Stretch to increase Flexion: 5 reps;30 seconds;Limitations Knee: Self-Stretch Limitations: chair stretch Aerobic Stationary Bike: 8'@3 .0 seat 8 Standing Terminal Knee Extension: 20 reps;Theraband Theraband Level (Terminal Knee Extension): Level 4 (Blue) Lateral Step Up: Step Height: 6";15 reps;Hand Hold: 1 Forward Step Up: Step Height: 6";15 reps;Hand Hold: 1 Step Down: Step Height: 6";15 reps;Hand Hold: 1 Other Standing Knee Exercises: lift foot onto 12" step x15 Other Standing Knee Exercises: TKF on 8" step 10x5" Supine Heel Slides: 10 reps Knee Extension: PROM Knee Flexion: PROM Other Supine Knee Exercises: Jt mobs   Modalities Modalities: Cryotherapy Manual Therapy Other Manual Therapy: PROM to increase R knee ext/flex  Cryotherapy Number Minutes Cryotherapy: 10 Minutes Cryotherapy Location: Knee (right) Type of Cryotherapy: Ice pack  Physical Therapy Assessment and Plan PT Assessment and Plan Clinical Impression Statement: Improving ROM; able to lift his leg with increased ease without ER of foot; increased TKF using 8" step without difficulty. PT Plan: G code update needed next visit; Re-eval.     Problem List Patient Active Problem List  Diagnosis  . OA (osteoarthritis) of knee  . Postop Hyponatremia  . Stiffness of knee joint, right  . Unable to ambulate  . Pain  due to total knee replacement    PT - End of Session Activity Tolerance: Patient tolerated treatment well General Behavior During Session: [facility] for tasks performed Cognition: [facility] for tasks performed  GP No functional reporting required  Lurena Nida, PTA/CLT 11/23/2011, 3:59 PM

## 2011-11-25 ENCOUNTER — Ambulatory Visit (HOSPITAL_COMMUNITY)
Admission: RE | Admit: 2011-11-25 | Discharge: 2011-11-25 | Disposition: A | Discharge status: Home / Self Care | Payer: Medicare Other | Source: Ambulatory Visit | Attending: Family Medicine | Admitting: Family Medicine

## 2011-11-25 DIAGNOSIS — R262 Difficulty in walking, not elsewhere classified: Secondary | ICD-10-CM

## 2011-11-25 DIAGNOSIS — T8484XA Pain due to internal orthopedic prosthetic devices, implants and grafts, initial encounter: Secondary | ICD-10-CM

## 2011-11-25 DIAGNOSIS — Z96659 Presence of unspecified artificial knee joint: Secondary | ICD-10-CM

## 2011-11-25 NOTE — Evaluation (Signed)
Physical Therapy RE-evaluation  Patient Details  Name: Paul Lam MRN: [id number] Date of Birth: [birth year]  Today's Date: 11/25/2011 Time: 4098-1191 PT Time Calculation (min): 44 min  Visit#: 20  of 20   Re-eval:   Assessment Diagnosis: R total knee  Surgical Date: 08/22/11  Authorization: Medicare  Authorization Time Period: G code Goal CI  Current CI   Past Medical History:  Past Medical History  Diagnosis Date  . Hypertension   . Sleep apnea     CPAP machine diagnosed 8 yrs ago   . Shortness of breath     related to weight   . Chronic kidney disease     hx of kidney stones   . Cancer     skin cancer removed left ear 2-3 weeks ago   . Arthritis     generalized arthritis   . Anxiety    Past Surgical History:  Past Surgical History  Procedure Date  . Other surgical history     left ear skin ca removed  2 weeks ago - not completeely healed 08/15/11   . Eye surgery     detached retina on right eye 11/12   . Appendectomy   . Tonsillectomy   . Total knee arthroplasty 08/22/2011    Procedure: TOTAL KNEE ARTHROPLASTY;  Surgeon: Loanne Drilling, MD;  Location: [facility];  Service: Orthopedics;  Laterality: Right;    Subjective Symptoms/Limitations Symptoms: Pt states he has some stiffness  How long can you sit comfortably?: The patient states he is able to sit for an hour comfortable. How long can you stand comfortably?: The patient states that he is able to stand for an hour without difficulty How long can you walk comfortably?: The patient is able to walk without his cane now and is able to walk for twenty minutes at one time  Special Tests: The patient is waking up three times a night still. Patient Stated Goals: The patient is still taking pain and mm relaxors. Pain Assessment Currently in Pain?: Yes Pain Score:   4 (worst is at a 7) Pain Location: Knee Pain Orientation: Left Pain Type: Surgical pain Pain Onset: More than a month ago Pain Frequency:  Constant    Prior Functioning  Home Living Lives With: Spouse Home Access: Stairs to enter Entrance Stairs-Number of Steps:  (4) Entrance Stairs-Rails: Right Prior Function Level of Independence: Independent with basic ADLs Able to Take Stairs?: Reciprically Leisure: Hobbies-yes (Comment)  Cognition/Observation Cognition Overall Cognitive Status: Appears within functional limits for tasks assessed  Sensation/Coordination/Flexibility/Functional Tests Functional Tests Functional Tests: LEFS 63/64  Assessment RLE AROM (degrees) Right Knee Extension: 9  (was 12  Pt states that his knee was not straight prior to sx) Right Knee Flexion: 112  (108) RLE PROM (degrees) Right Knee Extension: 7  Right Knee Flexion: 115  RLE Strength Right Hip Flexion: 5/5 Right Hip Extension: 5/5 (was 4/5) Right Hip ABduction: 5/5 Right Hip ADduction: 5/5 (was 4+) Right Knee Flexion: 5/5 Right Knee Extension: 5/5 Right Ankle Dorsiflexion: 5/5 Palpation Palpation: pitting edema in LE  Exercise/Treatments    Stretches Knee: Self-Stretch to increase Flexion: 3 reps;30 seconds Knee: Self-Stretch Limitations: chair stretch. Aerobic Stationary Bike: 10' @3 .0a   Seated Long Arc Quad: 15 reps;Weights Long Arc Quad Weight: 5 lbs. Supine Quad Sets: 15 reps Heel Slides: 15 reps Terminal Knee Extension: 15 reps Knee Extension: PROM Knee Flexion: PROM     Manual Therapy Manual Therapy: Edema management Edema Management: retro massage to decrease  swelling  Physical Therapy Assessment and Plan PT Assessment and Plan Clinical Impression Statement: Pt strength wnl/ Rom continues to improve.  Pt no longer in need of skilled care. PT Plan: dischage pt.    Goals Home Exercise Program PT Goal: Perform Home Exercise Program - Progress: Met PT Short Term Goals PT Short Term Goal 1 - Progress: Progressing toward goal PT Short Term Goal 2 - Progress: Not met PT Short Term Goal 3 - Progress:  Met PT Long Term Goals PT Long Term Goal 1 - Progress: Progressing toward goal PT Long Term Goal 2 - Progress: Met Long Term Goal 3 Progress: Progressing toward goal Long Term Goal 4 Progress: Met Long Term Goal 5 Progress: Met  Problem List Patient Active Problem List  Diagnosis  . OA (osteoarthritis) of knee  . Postop Hyponatremia  . Stiffness of knee joint, right  . Unable to ambulate  . Pain due to total knee replacement    PT Plan of Care PT Home Exercise Plan: new HEP given with emphasis on ROM  GP  Functional Reporting Modifier  Discharge Status  [phone] - Mobility: Walking & Moving Around CI - At least 1% but less than 20% impaired, limited or restricted  Goal Status  [id number] - Mobility: [place] & Moving Around CI - At least 1% but less than 20% impaired, limited or restricted   Dewey Neukam,CINDY 11/25/2011, 5:09 PM  Physician Documentation Your signature is required to indicate approval of the treatment plan as stated above.  Please sign and either send electronically or make a copy of this report for your files and return this physician signed original.   Please mark one 1.__approve of plan  2. ___approve of plan with the following conditions.   ______________________________                                                          _____________________ Physician Signature                                                                                                             Date

## 2011-11-28 ENCOUNTER — Ambulatory Visit (HOSPITAL_COMMUNITY): Payer: Medicare Other | Admitting: *Deleted

## 2011-11-28 DIAGNOSIS — E785 Hyperlipidemia, unspecified: Secondary | ICD-10-CM | POA: Diagnosis not present

## 2011-11-28 DIAGNOSIS — N400 Enlarged prostate without lower urinary tract symptoms: Secondary | ICD-10-CM | POA: Diagnosis not present

## 2011-11-28 DIAGNOSIS — Z6841 Body Mass Index (BMI) 40.0 and over, adult: Secondary | ICD-10-CM | POA: Diagnosis not present

## 2011-11-28 DIAGNOSIS — Z79899 Other long term (current) drug therapy: Secondary | ICD-10-CM | POA: Diagnosis not present

## 2011-11-28 DIAGNOSIS — Z713 Dietary counseling and surveillance: Secondary | ICD-10-CM | POA: Diagnosis not present

## 2011-11-28 DIAGNOSIS — I10 Essential (primary) hypertension: Secondary | ICD-10-CM | POA: Diagnosis not present

## 2011-11-28 DIAGNOSIS — Z0000 Encounter for general adult medical examination without abnormal findings: Secondary | ICD-10-CM | POA: Diagnosis not present

## 2011-11-28 DIAGNOSIS — Z125 Encounter for screening for malignant neoplasm of prostate: Secondary | ICD-10-CM | POA: Diagnosis not present

## 2011-11-30 ENCOUNTER — Ambulatory Visit (HOSPITAL_COMMUNITY): Payer: Medicare Other | Admitting: Physical Therapy

## 2011-12-02 ENCOUNTER — Ambulatory Visit (HOSPITAL_COMMUNITY): Payer: Medicare Other | Admitting: Physical Therapy

## 2011-12-07 DIAGNOSIS — I10 Essential (primary) hypertension: Secondary | ICD-10-CM | POA: Diagnosis not present

## 2011-12-07 DIAGNOSIS — E782 Mixed hyperlipidemia: Secondary | ICD-10-CM | POA: Diagnosis not present

## 2011-12-07 DIAGNOSIS — I2510 Atherosclerotic heart disease of native coronary artery without angina pectoris: Secondary | ICD-10-CM | POA: Diagnosis not present

## 2011-12-19 DIAGNOSIS — N400 Enlarged prostate without lower urinary tract symptoms: Secondary | ICD-10-CM | POA: Diagnosis not present

## 2012-01-26 DIAGNOSIS — M1710 Unilateral primary osteoarthritis, unspecified knee: Secondary | ICD-10-CM | POA: Diagnosis not present

## 2012-02-27 DIAGNOSIS — T6391XA Toxic effect of contact with unspecified venomous animal, accidental (unintentional), initial encounter: Secondary | ICD-10-CM | POA: Diagnosis not present

## 2012-02-27 DIAGNOSIS — D235 Other benign neoplasm of skin of trunk: Secondary | ICD-10-CM | POA: Diagnosis not present

## 2012-04-25 DIAGNOSIS — L570 Actinic keratosis: Secondary | ICD-10-CM | POA: Diagnosis not present

## 2012-04-25 DIAGNOSIS — T6391XA Toxic effect of contact with unspecified venomous animal, accidental (unintentional), initial encounter: Secondary | ICD-10-CM | POA: Diagnosis not present

## 2012-06-26 DIAGNOSIS — H524 Presbyopia: Secondary | ICD-10-CM | POA: Diagnosis not present

## 2012-06-26 DIAGNOSIS — D3130 Benign neoplasm of unspecified choroid: Secondary | ICD-10-CM | POA: Diagnosis not present

## 2012-06-26 DIAGNOSIS — H35379 Puckering of macula, unspecified eye: Secondary | ICD-10-CM | POA: Diagnosis not present

## 2012-06-26 DIAGNOSIS — Z961 Presence of intraocular lens: Secondary | ICD-10-CM | POA: Diagnosis not present

## 2012-07-04 DIAGNOSIS — H35379 Puckering of macula, unspecified eye: Secondary | ICD-10-CM | POA: Diagnosis not present

## 2012-07-04 DIAGNOSIS — H43819 Vitreous degeneration, unspecified eye: Secondary | ICD-10-CM | POA: Diagnosis not present

## 2012-07-04 DIAGNOSIS — Z961 Presence of intraocular lens: Secondary | ICD-10-CM | POA: Diagnosis not present

## 2012-07-06 ENCOUNTER — Ambulatory Visit (INDEPENDENT_AMBULATORY_CARE_PROVIDER_SITE_OTHER): Payer: Medicare Other | Admitting: Ophthalmology

## 2012-07-06 DIAGNOSIS — H33309 Unspecified retinal break, unspecified eye: Secondary | ICD-10-CM

## 2012-07-06 DIAGNOSIS — H33009 Unspecified retinal detachment with retinal break, unspecified eye: Secondary | ICD-10-CM

## 2012-07-06 DIAGNOSIS — H43819 Vitreous degeneration, unspecified eye: Secondary | ICD-10-CM

## 2012-07-06 DIAGNOSIS — D3130 Benign neoplasm of unspecified choroid: Secondary | ICD-10-CM

## 2012-07-06 DIAGNOSIS — H35379 Puckering of macula, unspecified eye: Secondary | ICD-10-CM

## 2012-07-06 DIAGNOSIS — I10 Essential (primary) hypertension: Secondary | ICD-10-CM

## 2012-07-06 DIAGNOSIS — H35039 Hypertensive retinopathy, unspecified eye: Secondary | ICD-10-CM

## 2012-07-26 DIAGNOSIS — M1710 Unilateral primary osteoarthritis, unspecified knee: Secondary | ICD-10-CM | POA: Diagnosis not present

## 2012-09-26 DIAGNOSIS — Z961 Presence of intraocular lens: Secondary | ICD-10-CM | POA: Diagnosis not present

## 2012-09-26 DIAGNOSIS — H35379 Puckering of macula, unspecified eye: Secondary | ICD-10-CM | POA: Diagnosis not present

## 2012-09-29 DIAGNOSIS — N400 Enlarged prostate without lower urinary tract symptoms: Secondary | ICD-10-CM | POA: Diagnosis not present

## 2012-09-29 DIAGNOSIS — Z6841 Body Mass Index (BMI) 40.0 and over, adult: Secondary | ICD-10-CM | POA: Diagnosis not present

## 2012-09-29 DIAGNOSIS — IMO0001 Reserved for inherently not codable concepts without codable children: Secondary | ICD-10-CM | POA: Diagnosis not present

## 2012-09-29 DIAGNOSIS — G8929 Other chronic pain: Secondary | ICD-10-CM | POA: Diagnosis not present

## 2012-10-04 DIAGNOSIS — H35379 Puckering of macula, unspecified eye: Secondary | ICD-10-CM | POA: Diagnosis not present

## 2012-10-04 DIAGNOSIS — H3582 Retinal ischemia: Secondary | ICD-10-CM | POA: Diagnosis not present

## 2012-10-10 ENCOUNTER — Encounter (INDEPENDENT_AMBULATORY_CARE_PROVIDER_SITE_OTHER): Payer: Medicare Other | Admitting: Ophthalmology

## 2012-10-10 DIAGNOSIS — D3130 Benign neoplasm of unspecified choroid: Secondary | ICD-10-CM | POA: Diagnosis not present

## 2012-10-10 DIAGNOSIS — H35039 Hypertensive retinopathy, unspecified eye: Secondary | ICD-10-CM

## 2012-10-10 DIAGNOSIS — H43819 Vitreous degeneration, unspecified eye: Secondary | ICD-10-CM

## 2012-10-10 DIAGNOSIS — I10 Essential (primary) hypertension: Secondary | ICD-10-CM | POA: Diagnosis not present

## 2012-10-10 DIAGNOSIS — H35379 Puckering of macula, unspecified eye: Secondary | ICD-10-CM | POA: Diagnosis not present

## 2012-10-18 DIAGNOSIS — T6391XA Toxic effect of contact with unspecified venomous animal, accidental (unintentional), initial encounter: Secondary | ICD-10-CM | POA: Diagnosis not present

## 2012-10-18 DIAGNOSIS — I872 Venous insufficiency (chronic) (peripheral): Secondary | ICD-10-CM | POA: Diagnosis not present

## 2012-10-18 DIAGNOSIS — D235 Other benign neoplasm of skin of trunk: Secondary | ICD-10-CM | POA: Diagnosis not present

## 2012-10-18 DIAGNOSIS — L570 Actinic keratosis: Secondary | ICD-10-CM | POA: Diagnosis not present

## 2012-12-21 DIAGNOSIS — H35379 Puckering of macula, unspecified eye: Secondary | ICD-10-CM | POA: Diagnosis not present

## 2012-12-25 DIAGNOSIS — N400 Enlarged prostate without lower urinary tract symptoms: Secondary | ICD-10-CM | POA: Diagnosis not present

## 2012-12-25 DIAGNOSIS — I2510 Atherosclerotic heart disease of native coronary artery without angina pectoris: Secondary | ICD-10-CM | POA: Diagnosis not present

## 2012-12-25 DIAGNOSIS — Z0000 Encounter for general adult medical examination without abnormal findings: Secondary | ICD-10-CM | POA: Diagnosis not present

## 2012-12-25 DIAGNOSIS — Z6841 Body Mass Index (BMI) 40.0 and over, adult: Secondary | ICD-10-CM | POA: Diagnosis not present

## 2012-12-25 DIAGNOSIS — I10 Essential (primary) hypertension: Secondary | ICD-10-CM | POA: Diagnosis not present

## 2012-12-25 DIAGNOSIS — Z79899 Other long term (current) drug therapy: Secondary | ICD-10-CM | POA: Diagnosis not present

## 2012-12-26 ENCOUNTER — Encounter: Payer: Self-pay | Admitting: *Deleted

## 2012-12-26 ENCOUNTER — Encounter: Payer: Self-pay | Admitting: Cardiovascular Disease

## 2012-12-27 ENCOUNTER — Ambulatory Visit (INDEPENDENT_AMBULATORY_CARE_PROVIDER_SITE_OTHER): Payer: Medicare Other | Admitting: Internal Medicine

## 2012-12-27 ENCOUNTER — Encounter: Payer: Self-pay | Admitting: Internal Medicine

## 2012-12-27 VITALS — BP 128/84 | HR 68 | Ht 68.5 in | Wt 277.1 lb

## 2012-12-27 DIAGNOSIS — F411 Generalized anxiety disorder: Secondary | ICD-10-CM

## 2012-12-27 DIAGNOSIS — R42 Dizziness and giddiness: Secondary | ICD-10-CM | POA: Diagnosis not present

## 2012-12-27 DIAGNOSIS — F419 Anxiety disorder, unspecified: Secondary | ICD-10-CM | POA: Insufficient documentation

## 2012-12-27 DIAGNOSIS — R079 Chest pain, unspecified: Secondary | ICD-10-CM

## 2012-12-27 NOTE — Patient Instructions (Addendum)
Your physician wants you to follow-up in: 1 year. You will receive a reminder letter in the mail two months in advance. If you don't receive a letter, please call our office to schedule the follow-up appointment.  

## 2012-12-27 NOTE — Progress Notes (Signed)
OFFICE NOTE  Chief Complaint:  Routine followup  Primary Care Physician: Kirk Ruths, MD  HPI:  Paul Lam is a 72 year old gentleman with a history of coronary disease with an EF of about 50-55%, also hypertension, dyslipidemia and sleep apnea on CPAP. Recently his EF had been reported to be lower, however, it is actually fairly normal. Overall he has done very well. No chest pain, shortness of breath, palpitations, presyncope or syncopal symptoms. He did have other non-cardiac problems over the past year, including a detached retina, skin cancer with grafting to the left ear, as well as a total knee replacement. He tolerated these surgeries well without any evidence of MI. His only concern today is some positional dizziness. He says that he gets dizzy when he bends over too quickly and sits up. He also gets dizzy sometimes when turning his head to the side. It was thought this might be due to positional orthostasis and his Lasix was held recently but there has been no significant change in his symptoms. During his episodes he also feels like there is some spinning of the room which sounds like vertigo to me.  PMHx:  Past Medical History  Diagnosis Date  . Hypertension   . Sleep apnea     CPAP machine diagnosed 8 yrs ago   . Shortness of breath     related to weight   . Chronic kidney disease     hx of kidney stones   . Cancer     skin cancer removed left ear    . Arthritis     generalized arthritis   . Anxiety   . CAD (coronary artery disease)   . Hyperlipidemia   . Detached retina   . History of echocardiogram 02/19/2009    EF 50-55%; LA mild-mod dilated;   . History of nuclear stress test 02/19/2009    low risk, normal     Past Surgical History  Procedure Laterality Date  . Other surgical history      left ear skin ca removed  2 weeks ago - not completeely healed 08/15/11   . Eye surgery      detached retina on right eye 11/12   . Appendectomy    . Tonsillectomy     . Total knee arthroplasty  08/22/2011    Procedure: TOTAL KNEE ARTHROPLASTY;  Surgeon: Loanne Drilling, MD;  Location: [facility];  Service: Orthopedics;  Laterality: Right;  . Cardiac catheterization  07/13/2005    noncritical CAD    FAMHx:  Family History  Problem Relation Age of Onset  . Heart failure Mother     also arthritis  . Stroke Father     also emphysema  . Heart failure Father     SOCHx:   reports that he has never smoked. He has never used smokeless tobacco. He reports that  drinks alcohol. He reports that he does not use illicit drugs.  ALLERGIES:  No Known Allergies  ROS: A comprehensive review of systems was negative except for: Cardiovascular: positive for dyspnea Neurological: positive for dizziness  HOME MEDS: Current Outpatient Prescriptions  Medication Sig Dispense Refill  . ALPRAZolam (XANAX) 0.5 MG tablet Take 0.5 mg by mouth 3 (three) times daily as needed. Anxiety       . bisoprolol-hydrochlorothiazide (ZIAC) 2.5-6.25 MG per tablet Take 1 tablet by mouth daily with breakfast.      . furosemide (LASIX) 20 MG tablet Take 20 mg by mouth daily as needed.       Marland Kitchen  KRILL OIL PO Take 1 capsule by mouth daily.      Marland Kitchen lisinopril (PRINIVIL,ZESTRIL) 10 MG tablet Take 10 mg by mouth daily at 12 noon. Pt takes at lunchtime      . nabumetone (RELAFEN) 750 MG tablet Take 750 mg by mouth 2 (two) times daily.      . potassium chloride SA (K-DUR,KLOR-CON) 20 MEQ tablet Take 20 mEq by mouth daily.      . simvastatin (ZOCOR) 40 MG tablet Take 40 mg by mouth at bedtime.      . Tamsulosin HCl (FLOMAX) 0.4 MG CAPS Take 0.4 mg by mouth daily. Pt takes at lunchtime       No current facility-administered medications for this visit.    LABS/IMAGING: No results found for this or any previous visit (from the past 48 hour(s)). No results found.  VITALS: BP 128/84  Pulse 68  Ht 5' 8.5" (1.74 m)  Wt 277 lb 1.6 oz (125.692 kg)  BMI 41.52 kg/m2  EXAM: General appearance:  alert and no distress Neck: no adenopathy, no carotid bruit, no JVD, supple, symmetrical, trachea midline and thyroid not enlarged, symmetric, no tenderness/mass/nodules Lungs: clear to auscultation bilaterally Heart: regular rate and rhythm, S1, S2 normal, no murmur, click, rub or gallop Abdomen: soft, non-tender; bowel sounds normal; no masses,  no organomegaly and obese Extremities: extremities normal, atraumatic, no cyanosis or edema Pulses: 2+ and symmetric Skin: Skin color, texture, turgor normal. No rashes or lesions Neurologic: Grossly normal  EKG: Sinus rhythm at 62  ASSESSMENT: 1. Hypertension 2. Dyslipidemia 3. Obesity 4. Dizziness 5. Anxiety  PLAN: 1.   Mr. Hitchman is complaining of some positional dizziness which I don't think his orthostasis. He is Lasix has been stopped but he does take low-dose HCTZ in combination with bisoprolol. Some of his symptoms are more reminiscent of an inner ear problem. I would like to refer him to see Dr. Suszanne Conners with ENT.   Would not recommend any other changes to his cardiac meds and agree with taking Lasix for him only as needed.  Chrystie Nose, MD, [facility] Attending Cardiologist [facility]  Dajia Gunnels C 12/27/2012, 3:15 PM

## 2013-01-14 DIAGNOSIS — R42 Dizziness and giddiness: Secondary | ICD-10-CM | POA: Diagnosis not present

## 2013-01-14 DIAGNOSIS — H903 Sensorineural hearing loss, bilateral: Secondary | ICD-10-CM | POA: Diagnosis not present

## 2013-01-21 DIAGNOSIS — I872 Venous insufficiency (chronic) (peripheral): Secondary | ICD-10-CM | POA: Diagnosis not present

## 2013-01-21 DIAGNOSIS — L570 Actinic keratosis: Secondary | ICD-10-CM | POA: Diagnosis not present

## 2013-01-21 DIAGNOSIS — L259 Unspecified contact dermatitis, unspecified cause: Secondary | ICD-10-CM | POA: Diagnosis not present

## 2013-01-21 DIAGNOSIS — C44319 Basal cell carcinoma of skin of other parts of face: Secondary | ICD-10-CM | POA: Diagnosis not present

## 2013-02-05 ENCOUNTER — Telehealth: Payer: Self-pay | Admitting: Internal Medicine

## 2013-02-05 NOTE — Telephone Encounter (Signed)
Please call-still have not gotten his sleep machine,

## 2013-02-05 NOTE — Telephone Encounter (Signed)
Pt is returning a phone call.

## 2013-02-05 NOTE — Telephone Encounter (Signed)
Left message call returned. Call back with concerns.

## 2013-02-06 NOTE — Telephone Encounter (Signed)
Message forwarded to W. Waddell, CMA.  Previous call r/t "sleep machine."

## 2013-02-08 ENCOUNTER — Telehealth: Payer: Self-pay | Admitting: *Deleted

## 2013-02-08 NOTE — Telephone Encounter (Signed)
**  Walk-In**  Pt requesting order for new CPAP machine r/t his buzzing.  Pt informed, per Burna Mortimer, that he will need to take machine to [facility] and have them fax orders for Dr. Tresa Endo to sign.  Pt verbalized understanding and agreed w/ plan.  Stated they told him to get order from MD and bring to them.  Pt given fax number (nurses station) to have order faxed to Dr. Tresa Endo, Salome Arnt: Burna Mortimer.  Pt verbalized understanding and agreed w/ plan.  Pt also agreed to call before coming to office since he [place].

## 2013-02-27 ENCOUNTER — Telehealth: Payer: Self-pay | Admitting: Cardiovascular Disease

## 2013-02-27 NOTE — Telephone Encounter (Signed)
[facility] has received the order for the patient's new CPAP machine.

## 2013-03-01 DIAGNOSIS — G4730 Sleep apnea, unspecified: Secondary | ICD-10-CM | POA: Diagnosis not present

## 2013-03-01 DIAGNOSIS — Z23 Encounter for immunization: Secondary | ICD-10-CM | POA: Diagnosis not present

## 2013-03-01 DIAGNOSIS — Z6841 Body Mass Index (BMI) 40.0 and over, adult: Secondary | ICD-10-CM | POA: Diagnosis not present

## 2013-03-11 DIAGNOSIS — D485 Neoplasm of uncertain behavior of skin: Secondary | ICD-10-CM | POA: Diagnosis not present

## 2013-03-11 DIAGNOSIS — L570 Actinic keratosis: Secondary | ICD-10-CM | POA: Diagnosis not present

## 2013-03-11 DIAGNOSIS — Z85828 Personal history of other malignant neoplasm of skin: Secondary | ICD-10-CM | POA: Diagnosis not present

## 2013-03-15 DIAGNOSIS — I10 Essential (primary) hypertension: Secondary | ICD-10-CM | POA: Diagnosis not present

## 2013-03-15 DIAGNOSIS — E669 Obesity, unspecified: Secondary | ICD-10-CM | POA: Diagnosis not present

## 2013-03-15 DIAGNOSIS — G4710 Hypersomnia, unspecified: Secondary | ICD-10-CM | POA: Diagnosis not present

## 2013-03-15 DIAGNOSIS — G4730 Sleep apnea, unspecified: Secondary | ICD-10-CM | POA: Diagnosis not present

## 2013-04-04 DIAGNOSIS — G4710 Hypersomnia, unspecified: Secondary | ICD-10-CM | POA: Diagnosis not present

## 2013-04-04 DIAGNOSIS — G4761 Periodic limb movement disorder: Secondary | ICD-10-CM | POA: Diagnosis not present

## 2013-05-07 DIAGNOSIS — G4761 Periodic limb movement disorder: Secondary | ICD-10-CM | POA: Diagnosis not present

## 2013-05-07 DIAGNOSIS — G4710 Hypersomnia, unspecified: Secondary | ICD-10-CM | POA: Diagnosis not present

## 2013-05-14 ENCOUNTER — Telehealth: Payer: Self-pay | Admitting: Cardiology

## 2013-05-14 NOTE — Telephone Encounter (Signed)
Please let patient know that he had successful BiPAP titration to 13/9cm H2O and set up  OV with me in 10 weeks.  Also get an overnight pulse ox on BiPAP

## 2013-05-14 NOTE — Telephone Encounter (Signed)
To Danielle.  

## 2013-05-15 ENCOUNTER — Other Ambulatory Visit: Payer: Self-pay | Admitting: General Surgery

## 2013-05-15 DIAGNOSIS — G4733 Obstructive sleep apnea (adult) (pediatric): Secondary | ICD-10-CM

## 2013-05-15 NOTE — Telephone Encounter (Signed)
Pt is aware and 10 week f.u set up for pt.

## 2013-05-15 NOTE — Telephone Encounter (Signed)
LVm for pt to return call. Ordered Overnight pulse ox on bipap and put order in system for Betsy at [facility]

## 2013-05-17 DIAGNOSIS — G4733 Obstructive sleep apnea (adult) (pediatric): Secondary | ICD-10-CM | POA: Diagnosis not present

## 2013-05-23 ENCOUNTER — Telehealth: Payer: Self-pay | Admitting: Cardiology

## 2013-05-23 NOTE — Telephone Encounter (Signed)
Pt is aware.  

## 2013-05-23 NOTE — Telephone Encounter (Signed)
Please let patient know that his overnight oximetry showed his lowes O2 sat to be 88% and only for 1 minute.  He does not qualify for nighttime O2 based on these results

## 2013-05-28 DIAGNOSIS — G4700 Insomnia, unspecified: Secondary | ICD-10-CM | POA: Diagnosis not present

## 2013-05-28 DIAGNOSIS — Z6841 Body Mass Index (BMI) 40.0 and over, adult: Secondary | ICD-10-CM | POA: Diagnosis not present

## 2013-05-29 ENCOUNTER — Telehealth: Payer: Self-pay | Admitting: Cardiology

## 2013-05-29 NOTE — Telephone Encounter (Signed)
New Message  Pt went to [facility] in [place] to retrieve CPAP.Marland Kitchen [facility] turned him away stating that he would need orders faxed in to receive a CPAP machine.  Pt requests a call back on how to move forward.  Please assist

## 2013-05-29 NOTE — Telephone Encounter (Signed)
Called sleep center and asked them to refax over pts sleep study results to [facility] so pt can get his bipap machine.

## 2013-05-31 NOTE — Telephone Encounter (Signed)
Called Sleep lab and they were not open and the answering machine turned off.

## 2013-06-04 NOTE — Telephone Encounter (Signed)
Called the sleep lab and they were not open and the answering machine was off.

## 2013-06-05 NOTE — Telephone Encounter (Signed)
I called the sleep lab again and once again the message machine was turned off and I was unable to LVM.

## 2013-06-07 ENCOUNTER — Encounter: Payer: Self-pay | Admitting: Cardiology

## 2013-06-10 NOTE — Telephone Encounter (Signed)
Spoke with Debbie at the sleep lab and she stated she would fax over the referral for the pt today. I called pt and made aware that [facility] should have the referral by tomorrow.

## 2013-06-21 ENCOUNTER — Encounter: Payer: Self-pay | Admitting: Cardiology

## 2013-06-27 DIAGNOSIS — R972 Elevated prostate specific antigen [PSA]: Secondary | ICD-10-CM | POA: Diagnosis not present

## 2013-06-27 DIAGNOSIS — N400 Enlarged prostate without lower urinary tract symptoms: Secondary | ICD-10-CM | POA: Diagnosis not present

## 2013-07-12 ENCOUNTER — Ambulatory Visit (INDEPENDENT_AMBULATORY_CARE_PROVIDER_SITE_OTHER): Payer: Medicare Other | Admitting: Ophthalmology

## 2013-07-15 DIAGNOSIS — Z85828 Personal history of other malignant neoplasm of skin: Secondary | ICD-10-CM | POA: Diagnosis not present

## 2013-07-15 DIAGNOSIS — D0430 Carcinoma in situ of skin of unspecified part of face: Secondary | ICD-10-CM | POA: Diagnosis not present

## 2013-07-15 DIAGNOSIS — D0439 Carcinoma in situ of skin of other parts of face: Secondary | ICD-10-CM | POA: Diagnosis not present

## 2013-07-15 DIAGNOSIS — D485 Neoplasm of uncertain behavior of skin: Secondary | ICD-10-CM | POA: Diagnosis not present

## 2013-07-29 ENCOUNTER — Encounter: Payer: Self-pay | Admitting: Cardiology

## 2013-07-31 ENCOUNTER — Ambulatory Visit (INDEPENDENT_AMBULATORY_CARE_PROVIDER_SITE_OTHER): Payer: Medicare Other | Admitting: Cardiology

## 2013-07-31 ENCOUNTER — Encounter: Payer: Self-pay | Admitting: Cardiology

## 2013-07-31 VITALS — BP 160/65 | HR 56 | Ht 68.0 in | Wt 267.8 lb

## 2013-07-31 DIAGNOSIS — I10 Essential (primary) hypertension: Secondary | ICD-10-CM | POA: Diagnosis not present

## 2013-07-31 DIAGNOSIS — G4733 Obstructive sleep apnea (adult) (pediatric): Secondary | ICD-10-CM | POA: Diagnosis not present

## 2013-07-31 NOTE — Patient Instructions (Signed)
Your physician recommends that you continue on your current medications as directed. Please refer to the Current Medication list given to you today.  Your physician wants you to follow-up in: 6 Months with Dr Turner You will receive a reminder letter in the mail two months in advance. If you don't receive a letter, please call our office to schedule the follow-up appointment.  

## 2013-07-31 NOTE — Progress Notes (Signed)
[facility], [place] [address] [place], [place]  [address] Phone: [phone] Fax:  [phone]  Date:  07/31/2013   ID:  Paul Lam, DOB 1940-10-28, MRN [id number]  PCP:  Leonides Grills, MD  Sleep Medicine:  Fransico Him, MD   History of Present Illness: Paul Lam is a 73 y.o. male with a history of obesity, HTN, CAD and OSA who had been on CPAP for a while and had to undergo repeat split night sleep study due to needing a new machine and was noted to have severe OSA with an AHI of 25/hr and is now on BiPAP.  He is doing well with his BiPAP.  He tolerates the pressure and full face mask well.  He feels rested in the am and his daytime sleepiness has improved.  He no longer snores.  He exercises on a stationary bike and a treadmill. He has chronic LE edema followed by Dr. Debara Pickett.   Wt Readings from Last 3 Encounters:  07/31/13 267 lb 12.8 oz (121.473 kg)  12/27/12 277 lb 1.6 oz (125.692 kg)  08/22/11 281 lb 8 oz (127.688 kg)     Past Medical History  Diagnosis Date  . Hypertension   . Sleep apnea     Severe OSA with AHI 25.4/hr now on BIPAP at 13/9cmH2O  . Shortness of breath     related to weight   . Chronic kidney disease     hx of kidney stones   . Cancer     skin cancer removed left ear    . Arthritis     generalized arthritis   . Anxiety   . CAD (coronary artery disease)   . Hyperlipidemia   . Detached retina   . History of echocardiogram 02/19/2009    EF 50-55%; LA mild-mod dilated;   . History of nuclear stress test 02/19/2009    low risk, normal     Current Outpatient Prescriptions  Medication Sig Dispense Refill  . ALPRAZolam (XANAX) 0.5 MG tablet Take 0.5 mg by mouth 3 (three) times daily as needed. Anxiety       . bisoprolol-hydrochlorothiazide (ZIAC) 2.5-6.25 MG per tablet Take 1 tablet by mouth daily with breakfast.      . furosemide (LASIX) 20 MG tablet Take 20 mg by mouth daily as needed.       Marland Kitchen KRILL OIL PO Take 1 capsule by mouth daily.      Marland Kitchen  lisinopril (PRINIVIL,ZESTRIL) 10 MG tablet Take 10 mg by mouth daily at 12 noon. Pt takes at lunchtime      . nabumetone (RELAFEN) 750 MG tablet Take 750 mg by mouth 2 (two) times daily.      . simvastatin (ZOCOR) 40 MG tablet Take 40 mg by mouth at bedtime.      . Tamsulosin HCl (FLOMAX) 0.4 MG CAPS Take 0.4 mg by mouth daily. Pt takes at lunchtime      . zolpidem (AMBIEN) 10 MG tablet as needed.      . potassium chloride SA (K-DUR,KLOR-CON) 20 MEQ tablet Take 20 mEq by mouth daily.       No current facility-administered medications for this visit.    Allergies:   No Known Allergies  Social History:  The patient  reports that he has never smoked. He has never used smokeless tobacco. He reports that he drinks alcohol. He reports that he does not use illicit drugs.   Family History:  The patient's family history includes Heart failure in  his father and mother; Stroke in his father.   ROS:  Please see the history of present illness.      All other systems reviewed and negative.   PHYSICAL EXAM: VS:  BP 160/65  Pulse 56  Ht 5\' 8"  (1.727 m)  Wt 267 lb 12.8 oz (121.473 kg)  BMI 40.73 kg/m2 Well nourished, well developed, in no acute distress HEENT: normal Neck: no JVD Cardiac:  normal S1, S2; RRR; no murmur Lungs:  clear to auscultation bilaterally, no wheezing, rhonchi or rales Abd: soft, nontender, no hepatomegaly Ext: 2+  edema Skin: warm and dry Neuro:  CNs 2-12 intact, no focal abnormalities noted       ASSESSMENT AND PLAN:  1. Severe OSA on BiPAP  - I will get a BiPAP download from his DME 2. Obesity 3. HTN - mildly elevated  - continue Ziac/Lisinopril  - I have asked him to check his BP daily for a week and call with the results  Followup with me in 6 months  Signed, Fransico Him, MD 07/31/2013 1:32 PM

## 2013-08-08 ENCOUNTER — Telehealth: Payer: Self-pay | Admitting: Cardiology

## 2013-08-08 NOTE — Telephone Encounter (Signed)
Good BP readings and HR stable.  Continue current meds

## 2013-08-08 NOTE — Telephone Encounter (Signed)
New message    Need to give bp readings  2-26 121/56 pulse 51; 2-27 127/65 pulse 51; feb 28 110/52 pulse 55; mar-1 112/49 pulse 55;  mar-2 112/49 pulse 52; mar-3 110/53 pulse 55; mar-4 115/52 pulse 52; mar-5 131/59 pulse 52.  Did you get the cpap card from the home health company?  If not call pt [phone] or [phone] and they will bring it to you this am.

## 2013-08-08 NOTE — Telephone Encounter (Signed)
To Dr Turner to advise 

## 2013-08-09 NOTE — Telephone Encounter (Signed)
lmtrc

## 2013-08-09 NOTE — Telephone Encounter (Signed)
Paul Lam, pt has [place] and they are still waiting for Cpap download results.

## 2013-08-09 NOTE — Telephone Encounter (Signed)
Pt.notified

## 2013-08-12 DIAGNOSIS — L570 Actinic keratosis: Secondary | ICD-10-CM | POA: Diagnosis not present

## 2013-08-12 DIAGNOSIS — Z85828 Personal history of other malignant neoplasm of skin: Secondary | ICD-10-CM | POA: Diagnosis not present

## 2013-08-15 NOTE — Telephone Encounter (Signed)
Pts AHI was 1.8 and his average nightly usage was 8.51 hrs.

## 2013-08-15 NOTE — Telephone Encounter (Signed)
Pt is aware.  

## 2013-08-15 NOTE — Telephone Encounter (Signed)
lvm for pt to return call °

## 2013-08-16 ENCOUNTER — Encounter: Payer: Self-pay | Admitting: Cardiology

## 2013-08-17 ENCOUNTER — Encounter: Payer: Self-pay | Admitting: Cardiology

## 2013-08-28 DIAGNOSIS — Z6841 Body Mass Index (BMI) 40.0 and over, adult: Secondary | ICD-10-CM | POA: Diagnosis not present

## 2013-08-28 DIAGNOSIS — H8309 Labyrinthitis, unspecified ear: Secondary | ICD-10-CM | POA: Diagnosis not present

## 2013-08-28 DIAGNOSIS — I10 Essential (primary) hypertension: Secondary | ICD-10-CM | POA: Diagnosis not present

## 2013-08-28 DIAGNOSIS — H6690 Otitis media, unspecified, unspecified ear: Secondary | ICD-10-CM | POA: Diagnosis not present

## 2013-09-23 DIAGNOSIS — H35359 Cystoid macular degeneration, unspecified eye: Secondary | ICD-10-CM | POA: Diagnosis not present

## 2013-09-23 DIAGNOSIS — H35379 Puckering of macula, unspecified eye: Secondary | ICD-10-CM | POA: Diagnosis not present

## 2013-10-09 DIAGNOSIS — H1030 Unspecified acute conjunctivitis, unspecified eye: Secondary | ICD-10-CM | POA: Diagnosis not present

## 2013-10-09 DIAGNOSIS — H16109 Unspecified superficial keratitis, unspecified eye: Secondary | ICD-10-CM | POA: Diagnosis not present

## 2013-10-09 DIAGNOSIS — H10439 Chronic follicular conjunctivitis, unspecified eye: Secondary | ICD-10-CM | POA: Diagnosis not present

## 2013-10-15 ENCOUNTER — Ambulatory Visit (INDEPENDENT_AMBULATORY_CARE_PROVIDER_SITE_OTHER): Payer: Medicare Other | Admitting: Ophthalmology

## 2013-10-15 DIAGNOSIS — H33009 Unspecified retinal detachment with retinal break, unspecified eye: Secondary | ICD-10-CM | POA: Diagnosis not present

## 2013-10-15 DIAGNOSIS — I10 Essential (primary) hypertension: Secondary | ICD-10-CM

## 2013-10-15 DIAGNOSIS — D3130 Benign neoplasm of unspecified choroid: Secondary | ICD-10-CM

## 2013-10-15 DIAGNOSIS — H35039 Hypertensive retinopathy, unspecified eye: Secondary | ICD-10-CM | POA: Diagnosis not present

## 2013-10-15 DIAGNOSIS — H35379 Puckering of macula, unspecified eye: Secondary | ICD-10-CM | POA: Diagnosis not present

## 2013-10-15 DIAGNOSIS — H43819 Vitreous degeneration, unspecified eye: Secondary | ICD-10-CM

## 2013-10-16 DIAGNOSIS — R42 Dizziness and giddiness: Secondary | ICD-10-CM | POA: Diagnosis not present

## 2013-10-16 DIAGNOSIS — H903 Sensorineural hearing loss, bilateral: Secondary | ICD-10-CM | POA: Diagnosis not present

## 2013-10-16 DIAGNOSIS — H9319 Tinnitus, unspecified ear: Secondary | ICD-10-CM | POA: Diagnosis not present

## 2013-10-16 DIAGNOSIS — I10 Essential (primary) hypertension: Secondary | ICD-10-CM | POA: Diagnosis not present

## 2013-10-16 DIAGNOSIS — H73819 Atrophic flaccid tympanic membrane, unspecified ear: Secondary | ICD-10-CM | POA: Diagnosis not present

## 2013-10-22 DIAGNOSIS — Z6838 Body mass index (BMI) 38.0-38.9, adult: Secondary | ICD-10-CM | POA: Diagnosis not present

## 2013-10-22 DIAGNOSIS — J060 Acute laryngopharyngitis: Secondary | ICD-10-CM | POA: Diagnosis not present

## 2013-10-23 ENCOUNTER — Encounter (INDEPENDENT_AMBULATORY_CARE_PROVIDER_SITE_OTHER): Payer: Medicare Other | Admitting: Ophthalmology

## 2013-10-23 DIAGNOSIS — H26499 Other secondary cataract, unspecified eye: Secondary | ICD-10-CM

## 2013-10-30 ENCOUNTER — Ambulatory Visit (INDEPENDENT_AMBULATORY_CARE_PROVIDER_SITE_OTHER): Payer: Medicare Other | Admitting: Ophthalmology

## 2013-11-01 ENCOUNTER — Ambulatory Visit (INDEPENDENT_AMBULATORY_CARE_PROVIDER_SITE_OTHER): Payer: Medicare Other | Admitting: Ophthalmology

## 2013-11-01 DIAGNOSIS — H2700 Aphakia, unspecified eye: Secondary | ICD-10-CM

## 2013-11-18 DIAGNOSIS — I8310 Varicose veins of unspecified lower extremity with inflammation: Secondary | ICD-10-CM | POA: Diagnosis not present

## 2013-11-18 DIAGNOSIS — L538 Other specified erythematous conditions: Secondary | ICD-10-CM | POA: Diagnosis not present

## 2014-01-02 ENCOUNTER — Encounter: Payer: Self-pay | Admitting: Internal Medicine

## 2014-01-02 ENCOUNTER — Ambulatory Visit (INDEPENDENT_AMBULATORY_CARE_PROVIDER_SITE_OTHER): Payer: Medicare Other | Admitting: Internal Medicine

## 2014-01-02 VITALS — BP 118/56 | HR 47 | Ht 68.0 in | Wt 243.5 lb

## 2014-01-02 DIAGNOSIS — R42 Dizziness and giddiness: Secondary | ICD-10-CM

## 2014-01-02 DIAGNOSIS — G4733 Obstructive sleep apnea (adult) (pediatric): Secondary | ICD-10-CM

## 2014-01-02 DIAGNOSIS — R001 Bradycardia, unspecified: Secondary | ICD-10-CM | POA: Insufficient documentation

## 2014-01-02 DIAGNOSIS — I10 Essential (primary) hypertension: Secondary | ICD-10-CM

## 2014-01-02 DIAGNOSIS — I498 Other specified cardiac arrhythmias: Secondary | ICD-10-CM | POA: Diagnosis not present

## 2014-01-02 NOTE — Patient Instructions (Signed)
Your physician has recommended you make the following change in your medication: Paul Lam wants you to follow-up in: 1 year. You will receive a reminder letter in the mail two months in advance. If you don't receive a letter, please call our office to schedule the follow-up appointment.

## 2014-01-02 NOTE — Progress Notes (Signed)
OFFICE NOTE  Chief Complaint:  Routine followup  Primary Care Physician: Leonides Grills, MD  HPI:  Paul Lam is a 73 year old gentleman with a history of coronary disease with an EF of about 50-55%, also hypertension, dyslipidemia and sleep apnea on CPAP. Recently his EF had been reported to be lower, however, it is actually fairly normal. Overall he has done very well. No chest pain, shortness of breath, palpitations, presyncope or syncopal symptoms. He did have other non-cardiac problems over the past year, including a detached retina, skin cancer with grafting to the left ear, as well as a total knee replacement. He tolerated these surgeries well without any evidence of MI. His only concern today is some positional dizziness. He says that he gets dizzy when he bends over too quickly and sits up. He also gets dizzy sometimes when turning his head to the side. It was thought this might be due to positional orthostasis and his Lasix was held recently but there has been no significant change in his symptoms. During his episodes he also feels like there is some spinning of the room which sounds like vertigo to me.  Paul Lam returns today he is doing fairly well. He has felt somewhat fatigued recently however and it is noted that he is bradycardic today. He still get some positional dizziness and had one episode of vertigo. He was seen at [facility] and they thought that there was possibly some insufficiency to the brain may be vertebrobasilar insufficiency that was leading to his vestibular problems.  PMHx:  Past Medical History  Diagnosis Date  . Sleep apnea     CPAP machine diagnosed 8 yrs ago   . Shortness of breath     related to weight   . Chronic kidney disease     hx of kidney stones   . Cancer     skin cancer removed left ear    . Arthritis     generalized arthritis   . Anxiety   . CAD (coronary artery disease)   . Hyperlipidemia   . Detached retina   .  History of echocardiogram 02/19/2009    EF 50-55%; LA mild-mod dilated;   . History of nuclear stress test 02/19/2009    low risk, normal   . OSA (obstructive sleep apnea)     severe with AHI 25/hr now on BiPAP  . Hypertension     Past Surgical History  Procedure Laterality Date  . Other surgical history      left ear skin ca removed  2 weeks ago - not completeely healed 08/15/11   . Eye surgery      detached retina on right eye 11/12   . Appendectomy    . Tonsillectomy    . Total knee arthroplasty  08/22/2011    Procedure: TOTAL KNEE ARTHROPLASTY;  Surgeon: Gearlean Alf, MD;  Location: [facility];  Service: Orthopedics;  Laterality: Right;  . Cardiac catheterization  07/13/2005    noncritical CAD    FAMHx:  Family History  Problem Relation Age of Onset  . Heart failure Mother     also arthritis  . Stroke Father     also emphysema  . Heart failure Father     SOCHx:   reports that he has never smoked. He has never used smokeless tobacco. He reports that he drinks alcohol. He reports that he does not use illicit drugs.  ALLERGIES:  No Known Allergies  ROS: A comprehensive review of systems  was negative except for: Constitutional: positive for fatigue Cardiovascular: positive for dyspnea Neurological: positive for dizziness  HOME MEDS: Current Outpatient Prescriptions  Medication Sig Dispense Refill  . ALPRAZolam (XANAX) 0.5 MG tablet Take 0.5 mg by mouth 3 (three) times daily as needed. Anxiety       . CHERRY PO Take 1,200 mg by mouth daily.       Marland Kitchen CLOBETASOL PROPIONATE E 0.05 % emollient cream as needed.      . fluticasone (FLONASE) 50 MCG/ACT nasal spray as needed.      . furosemide (LASIX) 20 MG tablet Take 20 mg by mouth daily as needed.       Marland Kitchen ketoconazole (NIZORAL) 2 % cream as needed.      Marland Kitchen KRILL OIL PO Take 1 capsule by mouth daily.      Marland Kitchen lisinopril (PRINIVIL,ZESTRIL) 10 MG tablet Take 10 mg by mouth daily at 12 noon. Pt takes at lunchtime      . nabumetone  (RELAFEN) 750 MG tablet Take 750 mg by mouth 2 (two) times daily.      . NON FORMULARY at bedtime. VPAP      . potassium chloride SA (K-DUR,KLOR-CON) 20 MEQ tablet Take 20 mEq by mouth daily as needed (with Lasix).       Marland Kitchen simvastatin (ZOCOR) 40 MG tablet Take 40 mg by mouth at bedtime.      . Tamsulosin HCl (FLOMAX) 0.4 MG CAPS Take 0.4 mg by mouth daily. Pt takes at lunchtime       No current facility-administered medications for this visit.    LABS/IMAGING: No results found for this or any previous visit (from the past 48 hour(s)). No results found.  VITALS: BP 118/56  Pulse 47  Ht 5\' 8"  (1.727 m)  Wt 243 lb 8 oz (110.451 kg)  BMI 37.03 kg/m2  EXAM: General appearance: alert and no distress Neck: no adenopathy, no carotid bruit, no JVD, supple, symmetrical, trachea midline and thyroid not enlarged, symmetric, no tenderness/mass/nodules Lungs: clear to auscultation bilaterally Heart: regular rate and rhythm, S1, S2 normal, no murmur, click, rub or gallop Abdomen: soft, non-tender; bowel sounds normal; no masses,  no organomegaly and obese Extremities: extremities normal, atraumatic, no cyanosis or edema Pulses: 2+ and symmetric Skin: Skin color, texture, turgor normal. No rashes or lesions Neurologic: Grossly normal  EKG: Sinus bradycardia at 47  ASSESSMENT: 1. Hypertension 2. Dyslipidemia 3. Obesity 4. Dizziness 5. Anxiety 6. OSA on CPAP  PLAN: 1.   Paul Lam is complaining of some positional dizziness which I don't think his orthostasis. He is Lasix has been stopped but he does take low-dose HCTZ in combination with bisoprolol. His heart rate is noted to be bradycardic today and I believe this could be playing a role in his symptoms. I have discontinued his bisoprolol/HCTZ. His blood pressure is well controlled and should tolerate this change. Otherwise he continues to need to work on exercise and weight loss. He does have obstructive sleep apnea and CPAP and has been  having problems with his mask recently as well as having a dry throat. He scheduled to see Dr. Radford Pax for reevaluation of this soon.  Pixie Casino, MD, [facility] Attending Cardiologist [facility] 01/02/2014, 1:36 PM

## 2014-01-29 DIAGNOSIS — Z6836 Body mass index (BMI) 36.0-36.9, adult: Secondary | ICD-10-CM | POA: Diagnosis not present

## 2014-01-29 DIAGNOSIS — Z0000 Encounter for general adult medical examination without abnormal findings: Secondary | ICD-10-CM | POA: Diagnosis not present

## 2014-01-29 DIAGNOSIS — N400 Enlarged prostate without lower urinary tract symptoms: Secondary | ICD-10-CM | POA: Diagnosis not present

## 2014-01-29 DIAGNOSIS — E785 Hyperlipidemia, unspecified: Secondary | ICD-10-CM | POA: Diagnosis not present

## 2014-02-05 ENCOUNTER — Encounter: Payer: Self-pay | Admitting: Cardiology

## 2014-02-05 ENCOUNTER — Ambulatory Visit (INDEPENDENT_AMBULATORY_CARE_PROVIDER_SITE_OTHER): Payer: Medicare Other | Admitting: Cardiology

## 2014-02-05 VITALS — BP 118/60 | HR 66 | Ht 70.0 in | Wt 244.0 lb

## 2014-02-05 DIAGNOSIS — I10 Essential (primary) hypertension: Secondary | ICD-10-CM | POA: Diagnosis not present

## 2014-02-05 DIAGNOSIS — E669 Obesity, unspecified: Secondary | ICD-10-CM | POA: Insufficient documentation

## 2014-02-05 DIAGNOSIS — G4733 Obstructive sleep apnea (adult) (pediatric): Secondary | ICD-10-CM | POA: Diagnosis not present

## 2014-02-05 NOTE — Patient Instructions (Signed)
Your physician recommends that you continue on your current medications as directed. Please refer to the Current Medication list given to you today.  Your physician wants you to follow-up in: 6 months with Dr Turner You will receive a reminder letter in the mail two months in advance. If you don't receive a letter, please call our office to schedule the follow-up appointment.  

## 2014-02-05 NOTE — Progress Notes (Signed)
[facility] [facility], [address] [place], [place]  [address] Phone: [phone] Fax:  [phone]  Date:  02/05/2014   ID:  Paul Lam, DOB [birth year], MRN [id number]  PCP:  Leonides Grills, MD  Sleep Medicine:  Fransico Him, MD   History of Present Illness: Paul Lam is a 73 y.o. male with a history of obesity, HTN, obesity and OSA who had been on CPAP for a while and had to undergo repeat split night sleep study due to needing a new machine and was noted to have severe OSA with an AHI of 25/hr and is now on BiPAP. He is doing well with his BiPAP. He tolerates the pressure and full face mask well. He feels rested in the am and his daytime sleepiness has improved. He rarely takes a nap in the evening.  He no longer snores unless he sleeps on his back. He exercises on a stationary bike and a treadmill. He has chronic LE edema followed by Dr. Debara Pickett.    Wt Readings from Last 3 Encounters:  02/05/14 244 lb (110.678 kg)  01/02/14 243 lb 8 oz (110.451 kg)  07/31/13 267 lb 12.8 oz (121.473 kg)     Past Medical History  Diagnosis Date  . Shortness of breath     related to weight   . Chronic kidney disease     hx of kidney stones   . Cancer     skin cancer removed left ear    . Arthritis     generalized arthritis   . Anxiety   . CAD (coronary artery disease)   . Hyperlipidemia   . Detached retina   . History of echocardiogram 02/19/2009    EF 50-55%; LA mild-mod dilated;   . History of nuclear stress test 02/19/2009    low risk, normal   . OSA (obstructive sleep apnea)     severe with AHI 25/hr now on BiPAP  . Hypertension     Current Outpatient Prescriptions  Medication Sig Dispense Refill  . ALPRAZolam (XANAX) 0.5 MG tablet Take 0.5 mg by mouth 3 (three) times daily as needed. Anxiety       . CHERRY PO Take 1,200 mg by mouth daily.       Marland Kitchen CLOBETASOL PROPIONATE E 0.05 % emollient cream as needed.      . fluticasone (FLONASE) 50 MCG/ACT nasal spray as needed.      .  furosemide (LASIX) 20 MG tablet Take 20 mg by mouth 2 (two) times a week.       Marland Kitchen ketoconazole (NIZORAL) 2 % cream as needed.      Marland Kitchen KRILL OIL PO Take 1 capsule by mouth daily.      Marland Kitchen lisinopril (PRINIVIL,ZESTRIL) 10 MG tablet Take 10 mg by mouth daily at 12 noon. Pt takes at lunchtime      . nabumetone (RELAFEN) 750 MG tablet Take 750 mg by mouth 2 (two) times daily.      . NON FORMULARY at bedtime. VPAP      . potassium chloride SA (K-DUR,KLOR-CON) 20 MEQ tablet Take 20 mEq by mouth daily as needed (with Lasix).       Marland Kitchen simvastatin (ZOCOR) 40 MG tablet Take 40 mg by mouth at bedtime.      . Tamsulosin HCl (FLOMAX) 0.4 MG CAPS Take 0.4 mg by mouth daily. Pt takes at lunchtime       No current facility-administered medications for this visit.    Allergies:  No Known Allergies  Social History:  The patient  reports that he has never smoked. He has never used smokeless tobacco. He reports that he drinks alcohol. He reports that he does not use illicit drugs.   Family History:  The patient's family history includes Heart failure in his father and mother; Stroke in his father.   ROS:  Please see the history of present illness.      All other systems reviewed and negative.   PHYSICAL EXAM: VS:  BP 118/60  Pulse 66  Ht 5\' 10"  (1.778 m)  Wt 244 lb (110.678 kg)  BMI 35.01 kg/m2 Well nourished, well developed, in no acute distress HEENT: normal Neck: no JVD Cardiac:  normal S1, S2; RRR; no murmur Lungs:  clear to auscultation bilaterally, no wheezing, rhonchi or rales Abd: soft, nontender, no hepatomegaly Ext: no edema Skin: warm and dry Neuro:  CNs 2-12 intact, no focal abnormalities noted      ASSESSMENT AND PLAN:  1. Severe OSA on BiPAP - his d/l today showed an AHI of 3.9/hr on 13/9cm H2O and 100% compliance in using more than 4 hours nightly - He will continue on current BiPAP settings 2. Obesity - I have encouraged him to try to get into a formal walking routine.  He also has a  stationary bike 3. HTN - mildly elevated - continue Lisinopril   Followup with me in 6 months    Signed, Fransico Him, MD 02/05/2014 10:03 AM

## 2014-02-12 ENCOUNTER — Encounter: Payer: Self-pay | Admitting: Cardiology

## 2014-02-19 DIAGNOSIS — L570 Actinic keratosis: Secondary | ICD-10-CM | POA: Diagnosis not present

## 2014-02-19 DIAGNOSIS — L910 Hypertrophic scar: Secondary | ICD-10-CM | POA: Diagnosis not present

## 2014-02-19 DIAGNOSIS — D485 Neoplasm of uncertain behavior of skin: Secondary | ICD-10-CM | POA: Diagnosis not present

## 2014-03-18 DIAGNOSIS — H33001 Unspecified retinal detachment with retinal break, right eye: Secondary | ICD-10-CM | POA: Diagnosis not present

## 2014-03-18 DIAGNOSIS — H04123 Dry eye syndrome of bilateral lacrimal glands: Secondary | ICD-10-CM | POA: Diagnosis not present

## 2014-03-18 DIAGNOSIS — Z961 Presence of intraocular lens: Secondary | ICD-10-CM | POA: Diagnosis not present

## 2014-03-18 DIAGNOSIS — H35373 Puckering of macula, bilateral: Secondary | ICD-10-CM | POA: Diagnosis not present

## 2014-03-20 DIAGNOSIS — B351 Tinea unguium: Secondary | ICD-10-CM | POA: Diagnosis not present

## 2014-03-20 DIAGNOSIS — M79675 Pain in left toe(s): Secondary | ICD-10-CM | POA: Diagnosis not present

## 2014-05-14 DIAGNOSIS — L570 Actinic keratosis: Secondary | ICD-10-CM | POA: Diagnosis not present

## 2014-05-14 DIAGNOSIS — L910 Hypertrophic scar: Secondary | ICD-10-CM | POA: Diagnosis not present

## 2014-05-14 DIAGNOSIS — Z85828 Personal history of other malignant neoplasm of skin: Secondary | ICD-10-CM | POA: Diagnosis not present

## 2014-05-15 DIAGNOSIS — B351 Tinea unguium: Secondary | ICD-10-CM | POA: Diagnosis not present

## 2014-05-15 DIAGNOSIS — M79674 Pain in right toe(s): Secondary | ICD-10-CM | POA: Diagnosis not present

## 2014-05-15 DIAGNOSIS — M79675 Pain in left toe(s): Secondary | ICD-10-CM | POA: Diagnosis not present

## 2014-07-24 DIAGNOSIS — L110 Acquired keratosis follicularis: Secondary | ICD-10-CM | POA: Diagnosis not present

## 2014-07-24 DIAGNOSIS — B351 Tinea unguium: Secondary | ICD-10-CM | POA: Diagnosis not present

## 2014-07-24 DIAGNOSIS — M722 Plantar fascial fibromatosis: Secondary | ICD-10-CM | POA: Diagnosis not present

## 2014-07-24 DIAGNOSIS — M79671 Pain in right foot: Secondary | ICD-10-CM | POA: Diagnosis not present

## 2014-07-25 DIAGNOSIS — I10 Essential (primary) hypertension: Secondary | ICD-10-CM | POA: Diagnosis not present

## 2014-07-25 DIAGNOSIS — F419 Anxiety disorder, unspecified: Secondary | ICD-10-CM | POA: Diagnosis not present

## 2014-07-25 DIAGNOSIS — E782 Mixed hyperlipidemia: Secondary | ICD-10-CM | POA: Diagnosis not present

## 2014-07-25 DIAGNOSIS — Z6838 Body mass index (BMI) 38.0-38.9, adult: Secondary | ICD-10-CM | POA: Diagnosis not present

## 2014-07-25 DIAGNOSIS — G4700 Insomnia, unspecified: Secondary | ICD-10-CM | POA: Diagnosis not present

## 2014-08-13 DIAGNOSIS — L570 Actinic keratosis: Secondary | ICD-10-CM | POA: Diagnosis not present

## 2014-08-13 DIAGNOSIS — C44329 Squamous cell carcinoma of skin of other parts of face: Secondary | ICD-10-CM | POA: Diagnosis not present

## 2014-08-13 DIAGNOSIS — Z85828 Personal history of other malignant neoplasm of skin: Secondary | ICD-10-CM | POA: Diagnosis not present

## 2014-08-13 DIAGNOSIS — D485 Neoplasm of uncertain behavior of skin: Secondary | ICD-10-CM | POA: Diagnosis not present

## 2014-08-28 DIAGNOSIS — C44320 Squamous cell carcinoma of skin of unspecified parts of face: Secondary | ICD-10-CM | POA: Diagnosis not present

## 2014-09-05 DIAGNOSIS — D72829 Elevated white blood cell count, unspecified: Secondary | ICD-10-CM | POA: Diagnosis not present

## 2014-09-18 DIAGNOSIS — C44320 Squamous cell carcinoma of skin of unspecified parts of face: Secondary | ICD-10-CM | POA: Diagnosis not present

## 2014-10-09 DIAGNOSIS — L110 Acquired keratosis follicularis: Secondary | ICD-10-CM | POA: Diagnosis not present

## 2014-10-09 DIAGNOSIS — M79674 Pain in right toe(s): Secondary | ICD-10-CM | POA: Diagnosis not present

## 2014-10-09 DIAGNOSIS — M79675 Pain in left toe(s): Secondary | ICD-10-CM | POA: Diagnosis not present

## 2014-10-09 DIAGNOSIS — B351 Tinea unguium: Secondary | ICD-10-CM | POA: Diagnosis not present

## 2014-11-07 ENCOUNTER — Ambulatory Visit (INDEPENDENT_AMBULATORY_CARE_PROVIDER_SITE_OTHER): Payer: Medicare Other | Admitting: Ophthalmology

## 2014-11-07 DIAGNOSIS — H35033 Hypertensive retinopathy, bilateral: Secondary | ICD-10-CM

## 2014-11-07 DIAGNOSIS — H35373 Puckering of macula, bilateral: Secondary | ICD-10-CM

## 2014-11-07 DIAGNOSIS — D3131 Benign neoplasm of right choroid: Secondary | ICD-10-CM

## 2014-11-07 DIAGNOSIS — H338 Other retinal detachments: Secondary | ICD-10-CM

## 2014-11-07 DIAGNOSIS — I10 Essential (primary) hypertension: Secondary | ICD-10-CM | POA: Diagnosis not present

## 2014-11-07 DIAGNOSIS — H43813 Vitreous degeneration, bilateral: Secondary | ICD-10-CM

## 2014-11-11 DIAGNOSIS — H00029 Hordeolum internum unspecified eye, unspecified eyelid: Secondary | ICD-10-CM | POA: Diagnosis not present

## 2014-11-11 DIAGNOSIS — H04123 Dry eye syndrome of bilateral lacrimal glands: Secondary | ICD-10-CM | POA: Diagnosis not present

## 2014-12-23 DIAGNOSIS — H1045 Other chronic allergic conjunctivitis: Secondary | ICD-10-CM | POA: Diagnosis not present

## 2015-01-01 DIAGNOSIS — I739 Peripheral vascular disease, unspecified: Secondary | ICD-10-CM | POA: Diagnosis not present

## 2015-01-01 DIAGNOSIS — B351 Tinea unguium: Secondary | ICD-10-CM | POA: Diagnosis not present

## 2015-01-01 DIAGNOSIS — L110 Acquired keratosis follicularis: Secondary | ICD-10-CM | POA: Diagnosis not present

## 2015-01-02 ENCOUNTER — Encounter: Payer: Self-pay | Admitting: Internal Medicine

## 2015-01-02 ENCOUNTER — Ambulatory Visit (INDEPENDENT_AMBULATORY_CARE_PROVIDER_SITE_OTHER): Payer: Medicare Other | Admitting: Internal Medicine

## 2015-01-02 VITALS — BP 110/62 | HR 72 | Ht 68.0 in | Wt 264.2 lb

## 2015-01-02 DIAGNOSIS — F419 Anxiety disorder, unspecified: Secondary | ICD-10-CM

## 2015-01-02 DIAGNOSIS — R262 Difficulty in walking, not elsewhere classified: Secondary | ICD-10-CM

## 2015-01-02 DIAGNOSIS — R42 Dizziness and giddiness: Secondary | ICD-10-CM | POA: Insufficient documentation

## 2015-01-02 DIAGNOSIS — G4733 Obstructive sleep apnea (adult) (pediatric): Secondary | ICD-10-CM | POA: Diagnosis not present

## 2015-01-02 NOTE — Progress Notes (Signed)
OFFICE NOTE  Chief Complaint:  Routine followup, vertigo  Primary Care Physician: Rocky Morel, MD  HPI:  Paul Lam is a 74 year old gentleman with a history of coronary disease with an EF of about 50-55%, also hypertension, dyslipidemia and sleep apnea on CPAP. Recently his EF had been reported to be lower, however, it is actually fairly normal. Overall he has done very well. No chest pain, shortness of breath, palpitations, presyncope or syncopal symptoms. He did have other non-cardiac problems over the past year, including a detached retina, skin cancer with grafting to the left ear, as well as a total knee replacement. He tolerated these surgeries well without any evidence of MI. His only concern today is some positional dizziness. He says that he gets dizzy when he bends over too quickly and sits up. He also gets dizzy sometimes when turning his head to the side. It was thought this might be due to positional orthostasis and his Lasix was held recently but there has been no significant change in his symptoms. During his episodes he also feels like there is some spinning of the room which sounds like vertigo to me.  Mr. Paul Lam returns today he is doing fairly well. He has felt somewhat fatigued recently however and it is noted that he is bradycardic today. He still get some positional dizziness and had one episode of vertigo. He was seen at [place] and they thought that there was possibly some insufficiency to the brain may be vertebrobasilar insufficiency that was leading to his vestibular problems.  Some Mr. Paul Lam back in the office today. Overall he is doing fairly well. He continues to struggle with vertigo and positional dizziness, mainly when bending over. He has chronic lower extremity edema which is likely due to venous hypertension. I've offered venous Dopplers before but he seems to be asymptomatic with it and he is declined. He's followed for sleep  apnea by Paul Lam and saw her recently and seems to be fairly compliant with this regimen. He takes furosemide several times a week for swelling.  PMHx:  Past Medical History  Diagnosis Date  . Sleep apnea   . Shortness of breath     related to weight   . Chronic kidney disease     hx of kidney stones   . Cancer     skin cancer removed left ear    . Arthritis     generalized arthritis   . Anxiety   . CAD (coronary artery disease)   . Hyperlipidemia   . Detached retina   . History of echocardiogram 02/19/2009    EF 50-55%; LA mild-mod dilated;   . History of nuclear stress test 02/19/2009    low risk, normal   . OSA (obstructive sleep apnea)     severe with AHI 25/hr now on BiPAP  . Hypertension   . Obesity (BMI 30-39.9)     Past Surgical History  Procedure Laterality Date  . Other surgical history      left ear skin ca removed  2 weeks ago - not completeely healed 08/15/11   . Eye surgery      detached retina on right eye 11/12   . Appendectomy    . Tonsillectomy    . Total knee arthroplasty  08/22/2011    Procedure: TOTAL KNEE ARTHROPLASTY;  Surgeon: Paul Alf, MD;  Location: [facility];  Service: Orthopedics;  Laterality: Right;  . Cardiac catheterization  07/13/2005    noncritical CAD  FAMHx:  Family History  Problem Relation Age of Onset  . Heart failure Mother     also arthritis  . Stroke Father     also emphysema  . Heart failure Father     SOCHx:   reports that he has never smoked. He has never used smokeless tobacco. He reports that he drinks alcohol. He reports that he does not use illicit drugs.  ALLERGIES:  No Known Allergies  ROS: A comprehensive review of systems was negative except for: Constitutional: positive for fatigue Cardiovascular: positive for dyspnea Neurological: positive for dizziness  HOME MEDS: Current Outpatient Prescriptions  Medication Sig Dispense Refill  . ALPRAZolam (XANAX) 0.5 MG tablet Take 0.5 mg by mouth 3 (three)  times daily as needed. Anxiety     . CHERRY PO Take 1,200 mg by mouth daily.     Marland Kitchen CLOBETASOL PROPIONATE E 0.05 % emollient cream as needed.    . fluorometholone (FML) 0.1 % ophthalmic suspension Place into both eyes as needed.  0  . fluticasone (FLONASE) 50 MCG/ACT nasal spray as needed.    . furosemide (LASIX) 20 MG tablet Take 20 mg by mouth 2 (two) times a week.     Marland Kitchen ketoconazole (NIZORAL) 2 % cream as needed.    Marland Kitchen KRILL OIL PO Take 1 capsule by mouth daily.    Marland Kitchen lisinopril (PRINIVIL,ZESTRIL) 10 MG tablet Take 10 mg by mouth daily at 12 noon.     . nabumetone (RELAFEN) 750 MG tablet Take 750 mg by mouth 2 (two) times daily.    . NON FORMULARY at bedtime. VPAP    . potassium chloride (K-DUR) 10 MEQ tablet Take 1 tablet by mouth. Takes when takes lasix as needed.  0  . simvastatin (ZOCOR) 40 MG tablet Take 40 mg by mouth at bedtime.    . Tamsulosin HCl (FLOMAX) 0.4 MG CAPS Take 0.4 mg by mouth daily. Pt takes at lunchtime     No current facility-administered medications for this visit.    LABS/IMAGING: No results found for this or any previous visit (from the past 48 hour(s)). No results found.  VITALS: BP 110/62 mmHg  Pulse 72  Ht 5\' 8"  (1.727 m)  Wt 264 lb 3.2 oz (119.84 kg)  BMI 40.18 kg/m2  EXAM: General appearance: alert and no distress Neck: no adenopathy, no carotid bruit, no JVD, supple, symmetrical, trachea midline and thyroid not enlarged, symmetric, no tenderness/mass/nodules Lungs: clear to auscultation bilaterally Heart: regular rate and rhythm, S1, S2 normal, no murmur, click, rub or gallop Abdomen: soft, non-tender; bowel sounds normal; no masses,  no organomegaly and obese Extremities: edema 1+ bilateral pitting Pulses: 2+ and symmetric Skin: Skin color, texture, turgor normal. No rashes or lesions Neurologic: Grossly normal  EKG: Normal sinus rhythm at 72  ASSESSMENT: 1. Hypertension 2. Dyslipidemia 3. Obesity 4. Dizziness 5. Anxiety 6. OSA on  CPAP 7. Chronic lower extremity edema secondary to venous hypertension  PLAN: 1.   Mr. Paul Lam feels well and denies any chest pain or shortness of breath. Blood pressure is well controlled and he has done well off of bisoprolol HCTZ. This does not seem to affect it is positional dizziness. Heart rate is a little bit higher. He is compliant on CPAP and follows with Paul Lam. He continues to have symptoms consistent with vertigo. His lower extremity swelling is a little worse today however he is not recently uses compression stockings. I've encouraged him to continue to use those as well as Lasix  and elevate the feet as much as possible. He needs to work on walking and weight loss.   Pixie Casino, MD, [facility] Attending Cardiologist Berkley C Hilty 01/02/2015, 9:53 AM

## 2015-01-02 NOTE — Patient Instructions (Signed)
Your physician wants you to follow-up in: 1 year with Dr. Hilty. You will receive a reminder letter in the mail two months in advance. If you don't receive a letter, please call our office to schedule the follow-up appointment.  

## 2015-01-29 DIAGNOSIS — F419 Anxiety disorder, unspecified: Secondary | ICD-10-CM | POA: Diagnosis not present

## 2015-01-29 DIAGNOSIS — Z1389 Encounter for screening for other disorder: Secondary | ICD-10-CM | POA: Diagnosis not present

## 2015-01-29 DIAGNOSIS — Z6841 Body Mass Index (BMI) 40.0 and over, adult: Secondary | ICD-10-CM | POA: Diagnosis not present

## 2015-02-18 DIAGNOSIS — L910 Hypertrophic scar: Secondary | ICD-10-CM | POA: Diagnosis not present

## 2015-02-18 DIAGNOSIS — Z85828 Personal history of other malignant neoplasm of skin: Secondary | ICD-10-CM | POA: Diagnosis not present

## 2015-02-18 DIAGNOSIS — L570 Actinic keratosis: Secondary | ICD-10-CM | POA: Diagnosis not present

## 2015-02-18 DIAGNOSIS — D485 Neoplasm of uncertain behavior of skin: Secondary | ICD-10-CM | POA: Diagnosis not present

## 2015-02-25 DIAGNOSIS — M545 Low back pain: Secondary | ICD-10-CM | POA: Diagnosis not present

## 2015-02-25 DIAGNOSIS — Z6841 Body Mass Index (BMI) 40.0 and over, adult: Secondary | ICD-10-CM | POA: Diagnosis not present

## 2015-02-25 DIAGNOSIS — M5136 Other intervertebral disc degeneration, lumbar region: Secondary | ICD-10-CM | POA: Diagnosis not present

## 2015-02-25 DIAGNOSIS — Z1389 Encounter for screening for other disorder: Secondary | ICD-10-CM | POA: Diagnosis not present

## 2015-02-25 DIAGNOSIS — M5410 Radiculopathy, site unspecified: Secondary | ICD-10-CM | POA: Diagnosis not present

## 2015-03-06 ENCOUNTER — Ambulatory Visit (INDEPENDENT_AMBULATORY_CARE_PROVIDER_SITE_OTHER): Payer: Medicare Other | Admitting: Urology

## 2015-03-06 DIAGNOSIS — N400 Enlarged prostate without lower urinary tract symptoms: Secondary | ICD-10-CM

## 2015-03-06 DIAGNOSIS — Z125 Encounter for screening for malignant neoplasm of prostate: Secondary | ICD-10-CM | POA: Diagnosis not present

## 2015-03-06 DIAGNOSIS — R3915 Urgency of urination: Secondary | ICD-10-CM

## 2015-03-26 DIAGNOSIS — L110 Acquired keratosis follicularis: Secondary | ICD-10-CM | POA: Diagnosis not present

## 2015-03-26 DIAGNOSIS — B351 Tinea unguium: Secondary | ICD-10-CM | POA: Diagnosis not present

## 2015-03-26 DIAGNOSIS — I739 Peripheral vascular disease, unspecified: Secondary | ICD-10-CM | POA: Diagnosis not present

## 2015-04-08 ENCOUNTER — Ambulatory Visit (INDEPENDENT_AMBULATORY_CARE_PROVIDER_SITE_OTHER): Payer: Medicare Other | Admitting: Urology

## 2015-04-08 DIAGNOSIS — R3915 Urgency of urination: Secondary | ICD-10-CM

## 2015-04-08 DIAGNOSIS — N400 Enlarged prostate without lower urinary tract symptoms: Secondary | ICD-10-CM | POA: Diagnosis not present

## 2015-04-08 DIAGNOSIS — R351 Nocturia: Secondary | ICD-10-CM

## 2015-04-28 DIAGNOSIS — Z23 Encounter for immunization: Secondary | ICD-10-CM | POA: Diagnosis not present

## 2015-04-28 DIAGNOSIS — G4730 Sleep apnea, unspecified: Secondary | ICD-10-CM | POA: Diagnosis not present

## 2015-04-28 DIAGNOSIS — Z1389 Encounter for screening for other disorder: Secondary | ICD-10-CM | POA: Diagnosis not present

## 2015-04-28 DIAGNOSIS — Z0001 Encounter for general adult medical examination with abnormal findings: Secondary | ICD-10-CM | POA: Diagnosis not present

## 2015-04-28 DIAGNOSIS — Z6841 Body Mass Index (BMI) 40.0 and over, adult: Secondary | ICD-10-CM | POA: Diagnosis not present

## 2015-04-28 DIAGNOSIS — I10 Essential (primary) hypertension: Secondary | ICD-10-CM | POA: Diagnosis not present

## 2015-04-28 DIAGNOSIS — E782 Mixed hyperlipidemia: Secondary | ICD-10-CM | POA: Diagnosis not present

## 2015-04-28 DIAGNOSIS — I2510 Atherosclerotic heart disease of native coronary artery without angina pectoris: Secondary | ICD-10-CM | POA: Diagnosis not present

## 2015-05-04 DIAGNOSIS — I10 Essential (primary) hypertension: Secondary | ICD-10-CM | POA: Diagnosis not present

## 2015-05-04 DIAGNOSIS — E785 Hyperlipidemia, unspecified: Secondary | ICD-10-CM | POA: Diagnosis not present

## 2015-05-27 DIAGNOSIS — H524 Presbyopia: Secondary | ICD-10-CM | POA: Diagnosis not present

## 2015-05-27 DIAGNOSIS — H338 Other retinal detachments: Secondary | ICD-10-CM | POA: Diagnosis not present

## 2015-05-27 DIAGNOSIS — H59811 Chorioretinal scars after surgery for detachment, right eye: Secondary | ICD-10-CM | POA: Diagnosis not present

## 2015-06-09 DIAGNOSIS — S81819A Laceration without foreign body, unspecified lower leg, initial encounter: Secondary | ICD-10-CM | POA: Diagnosis not present

## 2015-06-09 DIAGNOSIS — Z1389 Encounter for screening for other disorder: Secondary | ICD-10-CM | POA: Diagnosis not present

## 2015-06-09 DIAGNOSIS — T300 Burn of unspecified body region, unspecified degree: Secondary | ICD-10-CM | POA: Diagnosis not present

## 2015-06-09 DIAGNOSIS — Z6841 Body Mass Index (BMI) 40.0 and over, adult: Secondary | ICD-10-CM | POA: Diagnosis not present

## 2015-06-11 DIAGNOSIS — B351 Tinea unguium: Secondary | ICD-10-CM | POA: Diagnosis not present

## 2015-06-11 DIAGNOSIS — L110 Acquired keratosis follicularis: Secondary | ICD-10-CM | POA: Diagnosis not present

## 2015-06-11 DIAGNOSIS — I739 Peripheral vascular disease, unspecified: Secondary | ICD-10-CM | POA: Diagnosis not present

## 2015-06-18 DIAGNOSIS — Z1211 Encounter for screening for malignant neoplasm of colon: Secondary | ICD-10-CM | POA: Diagnosis not present

## 2015-06-30 NOTE — H&P (Signed)
  NTS SOAP Note  Vital Signs:  Vitals as of: [id number]: Systolic [id number]: Diastolic 83: Heart Rate 78: Temp 98.50F (Temporal): Height 79ft 8in: Weight 271Lbs 0 Ounces: BMI 41.2   BMI : 41.2 kg/m2  Subjective: This 75 year old male presents for of need for screening TCS.  Last had a TCS over ten years ago.  Denies any lower gi complaints.  No family h/o colon cancer.  Review of Symptoms:  Constitutional:unremarkable   Head:unremarkable Eyes:unremarkable   Nose/Mouth/Throat:unremarkable Cardiovascular:  unremarkable Respiratory:unremarkable Gastrointestinal:  unremarkable   Genitourinary:unremarkable   Musculoskeletal:unremarkable resolving burn to right flank Hematolgic/Lymphatic:unremarkable   Allergic/Immunologic:unremarkable   Past Medical History:  Reviewed  Past Medical History  Surgical History: right knee replacement, eye surgeries Medical Problems: HTN, high cholesterol Allergies: nkda Medications: lisinopril, flomax, norco, simvastatin, nabumetone, lasix, finasteride, baby asa   Social History:Reviewed  Social History  Preferred Language: English Race:  White Ethnicity: Not Hispanic / Latino Age: 67 year Marital Status:  M Alcohol: rarely   Smoking Status: Never smoker reviewed on 06/18/2015 Functional Status reviewed on 06/18/2015 ------------------------------------------------ Bathing: Normal Cooking: Normal Dressing: Normal Driving: Normal Eating: Normal Managing Meds: Normal Oral Care: Normal Shopping: Normal Toileting: Normal Transferring: Normal Walking: Normal Cognitive Status reviewed on 06/18/2015 ------------------------------------------------ Attention: Normal Decision Making: Normal Language: Normal Memory: Normal Motor: Normal Perception: Normal Problem Solving: Normal Visual and Spatial: Normal   Family History:Reviewed  Family Health History Mother, Deceased; Heart disease;  Father, Deceased; Heart  disease;     Objective Information: General:Well appearing, well nourished in no distress. Heart:RRR, no murmur Lungs:  CTA bilaterally, no wheezes, rhonchi, rales.  Breathing unlabored. Abdomen:Soft, NT/ND, no HSM, no masses. deferred to procedure  Assessment:Need for screening TCS  Diagnoses: V76.51  Z12.11 Screening for malignant neoplasm of colon (Encounter for screening for malignant neoplasm of colon)  Procedures: [id number] - OFFICE OUTPATIENT NEW 20 MINUTES    Plan:  Will call to schedule TCS.   Patient Education:Alternative treatments to surgery were discussed with patient (and family).  Risks and benefits  of procedure including bleeding and perforation were fully explained to the patient (and family) who gave informed consent. Patient/family questions were addressed.  Follow-up:Pending Surgery

## 2015-07-08 ENCOUNTER — Ambulatory Visit (INDEPENDENT_AMBULATORY_CARE_PROVIDER_SITE_OTHER): Payer: Medicare Other | Admitting: Urology

## 2015-07-08 DIAGNOSIS — R351 Nocturia: Secondary | ICD-10-CM | POA: Diagnosis not present

## 2015-07-08 DIAGNOSIS — N5201 Erectile dysfunction due to arterial insufficiency: Secondary | ICD-10-CM

## 2015-07-08 DIAGNOSIS — R3915 Urgency of urination: Secondary | ICD-10-CM

## 2015-07-08 DIAGNOSIS — N400 Enlarged prostate without lower urinary tract symptoms: Secondary | ICD-10-CM

## 2015-07-08 DIAGNOSIS — R3913 Splitting of urinary stream: Secondary | ICD-10-CM

## 2015-07-14 ENCOUNTER — Encounter (HOSPITAL_COMMUNITY)
Admission: RE | Disposition: A | Discharge status: Home / Self Care | Payer: Self-pay | Source: Ambulatory Visit | Attending: General Surgery

## 2015-07-14 ENCOUNTER — Encounter (HOSPITAL_COMMUNITY): Payer: Self-pay | Admitting: *Deleted

## 2015-07-14 ENCOUNTER — Ambulatory Visit (HOSPITAL_COMMUNITY)
Admission: RE | Admit: 2015-07-14 | Discharge: 2015-07-14 | Disposition: A | Discharge status: Home / Self Care | Payer: Medicare Other | Source: Ambulatory Visit | Attending: General Surgery | Admitting: General Surgery

## 2015-07-14 DIAGNOSIS — Z1211 Encounter for screening for malignant neoplasm of colon: Secondary | ICD-10-CM | POA: Insufficient documentation

## 2015-07-14 DIAGNOSIS — I10 Essential (primary) hypertension: Secondary | ICD-10-CM | POA: Diagnosis not present

## 2015-07-14 DIAGNOSIS — E7800 Pure hypercholesterolemia, unspecified: Secondary | ICD-10-CM | POA: Diagnosis not present

## 2015-07-14 DIAGNOSIS — Z7982 Long term (current) use of aspirin: Secondary | ICD-10-CM | POA: Diagnosis not present

## 2015-07-14 DIAGNOSIS — K5730 Diverticulosis of large intestine without perforation or abscess without bleeding: Secondary | ICD-10-CM | POA: Diagnosis not present

## 2015-07-14 DIAGNOSIS — Z79899 Other long term (current) drug therapy: Secondary | ICD-10-CM | POA: Insufficient documentation

## 2015-07-14 DIAGNOSIS — Z96651 Presence of right artificial knee joint: Secondary | ICD-10-CM | POA: Diagnosis not present

## 2015-07-14 HISTORY — DX: Angina pectoris, unspecified: I20.9

## 2015-07-14 HISTORY — PX: FLEXIBLE SIGMOIDOSCOPY: SHX5431

## 2015-07-14 HISTORY — DX: Dizziness and giddiness: R42

## 2015-07-14 SURGERY — SIGMOIDOSCOPY, FLEXIBLE
Anesthesia: Moderate Sedation

## 2015-07-14 MED ORDER — MIDAZOLAM HCL 5 MG/5ML IJ SOLN
INTRAMUSCULAR | Status: AC
  Filled 2015-07-14: qty 10

## 2015-07-14 MED ORDER — MEPERIDINE HCL 50 MG/ML IJ SOLN
INTRAMUSCULAR | Status: AC
  Filled 2015-07-14: qty 1

## 2015-07-14 MED ORDER — SODIUM CHLORIDE 0.9 % IV SOLN
INTRAVENOUS | Status: DC
  Administered 2015-07-14: 07:00:00 via INTRAVENOUS

## 2015-07-14 MED ORDER — MEPERIDINE HCL 50 MG/ML IJ SOLN
INTRAMUSCULAR | Status: DC | PRN
  Administered 2015-07-14: 50 mg via INTRAVENOUS

## 2015-07-14 MED ORDER — STERILE WATER FOR IRRIGATION IR SOLN
Status: DC | PRN
  Administered 2015-07-14: 08:00:00

## 2015-07-14 MED ORDER — MIDAZOLAM HCL 5 MG/5ML IJ SOLN
INTRAMUSCULAR | Status: DC | PRN
  Administered 2015-07-14: 3 mg via INTRAVENOUS
  Administered 2015-07-14 (×2): 1 mg via INTRAVENOUS

## 2015-07-14 NOTE — Discharge Instructions (Signed)
Flexible Sigmoidoscopy, Care After  Refer to this sheet in the next few weeks. These instructions provide you with information on caring for yourself after your procedure. Your health care provider may also give you more specific instructions. Your treatment has been planned according to current medical practices, but problems sometimes occur. Call your health care provider if you have any problems or questions after your procedure.  WHAT TO EXPECT AFTER THE PROCEDURE  After your procedure, it is typical to have the following:   · Abdominal cramps.  · Bloating.  · A small amount of rectal bleeding if you had a biopsy.  HOME CARE INSTRUCTIONS  · Only take over-the-counter or prescription medicines for pain, fever, or discomfort as directed by your health care provider.  · Resume your normal diet and activities as directed by your health care provider.  SEEK MEDICAL CARE IF:  · You have abdominal pain or cramping that lasts longer than 1 hour after the procedure.  · You continue to have small amounts of rectal bleeding after 24 hours.  · You have nausea or vomiting.  · You feel weak or dizzy.  SEEK IMMEDIATE MEDICAL CARE IF:   · You have a fever.  · You pass large blood clots or see a large amount of blood in the toilet after having a bowel movement. This may also occur 10-14 days after the procedure. It is more likely if you had a biopsy.  · You develop abdominal pain that is not relieved with medicine or your abdominal pain gets worse.  · You have nausea or vomiting for more than 24 hours after the procedure.     This information is not intended to replace advice given to you by your health care provider. Make sure you discuss any questions you have with your health care provider.     Document Released: 05/28/2013 Document Reviewed: 05/28/2013  Elsevier Interactive Patient Education ©2016 Elsevier Inc.

## 2015-07-14 NOTE — Interval H&P Note (Signed)
History and Physical Interval Note:  07/14/2015 7:26 AM  Paul Lam  has presented today for surgery, with the diagnosis of screening  The various methods of treatment have been discussed with the patient and family. After consideration of risks, benefits and other options for treatment, the patient has consented to  Procedure(s): COLONOSCOPY (N/A) as a surgical intervention .  The patient's history has been reviewed, patient examined, no change in status, stable for surgery.  I have reviewed the patient's chart and labs.  Questions were answered to the patient's satisfaction.     Aviva Signs A

## 2015-07-14 NOTE — Op Note (Signed)
[facility] [address] [place], [address]   COLONOSCOPY PROCEDURE REPORT     EXAM DATE: 08/07/2015  PATIENT NAME:      Paul Lam, Paul Lam           MR #:      [id number]  BIRTHDATE:       1940/06/23      VISIT #:     [id number]  ATTENDING:     Aviva Signs, MD     STATUS:     outpatient ASSISTANT:  INDICATIONS:  The patient is a 75 yr old male here for a colonoscopy due to average risk patient for colon cancer. PROCEDURE PERFORMED:     Flexible Sigmoidoscopy MEDICATIONS:     Demerol 50 mg IV and Versed 5 mg IV ESTIMATED BLOOD LOSS:     None  CONSENT: The patient understands the risks and benefits of the procedure and understands that these risks include, but are not limited to: sedation, allergic reaction, infection, perforation and/or bleeding. Alternative means of evaluation and treatment include, among others: physical exam, x-rays, and/or surgical intervention. The patient elects to proceed with this endoscopic procedure.  DESCRIPTION OF PROCEDURE: During intra-op preparation period all mechanical & medical equipment was checked for proper function. Hand hygiene and appropriate measures for infection prevention was taken. After the risks, benefits and alternatives of the procedure were thoroughly explained, Informed consent was verified, confirmed and timeout was successfully executed by the treatment team. A digital exam revealed no abnormalities of the rectum. The [id number]) endoscope was introduced through the anus and advanced to the descending colon. poor.  Myrna Blazer was used) The instrument was then slowly withdrawn as the colon was fully examined.Estimated blood loss is zero unless otherwise noted in this procedure report.   COLON FINDINGS: There was severe diverticulosis noted in the sigmoid colon with associated tortuosity.   Otherwise, normal mucosa. Retroflexed views revealed no abnormalities. Unable to safely complete  colonoscopy due to tortuosity of the sigmoid colon.  The scope was then completely withdrawn from the patient and the procedure terminated. SCOPE WITHDRAWAL TIME:    ADVERSE EVENTS:      There were no immediate complications.  IMPRESSIONS:     1.  There was severe diverticulosis noted in the sigmoid colon 2.  Otherwise, normal mucosa  RECOMMENDATIONS:     My office will arrange to you to have a barium enema performed.  This is a radiology test to further examine your colon. RECALL:  _____________________________ Aviva Signs, MD eSigned:  Aviva Signs, MD 07-Aug-2015 7:55 AM   cc:   CPT CODES: ICD CODES:  The ICD and CPT codes recommended by this software are interpretations from the data that the clinical staff has captured with the software.  The verification of the translation of this report to the ICD and CPT codes and modifiers is the sole responsibility of the health care institution and practicing physician where this report was generated.  [employer]. will not be held responsible for the validity of the ICD and CPT codes included on this report.  AMA assumes no liability for data contained or not contained herein. CPT is a Designer, television/film set of the [employer].

## 2015-07-16 ENCOUNTER — Encounter (HOSPITAL_COMMUNITY): Payer: Self-pay | Admitting: General Surgery

## 2015-08-12 DIAGNOSIS — L0231 Cutaneous abscess of buttock: Secondary | ICD-10-CM | POA: Diagnosis not present

## 2015-08-12 DIAGNOSIS — L02212 Cutaneous abscess of back [any part, except buttock]: Secondary | ICD-10-CM | POA: Diagnosis not present

## 2015-08-12 DIAGNOSIS — L0100 Impetigo, unspecified: Secondary | ICD-10-CM | POA: Diagnosis not present

## 2015-08-13 DIAGNOSIS — Z029 Encounter for administrative examinations, unspecified: Secondary | ICD-10-CM | POA: Diagnosis not present

## 2015-08-19 DIAGNOSIS — L570 Actinic keratosis: Secondary | ICD-10-CM | POA: Diagnosis not present

## 2015-08-19 DIAGNOSIS — Z85828 Personal history of other malignant neoplasm of skin: Secondary | ICD-10-CM | POA: Diagnosis not present

## 2015-08-19 DIAGNOSIS — L280 Lichen simplex chronicus: Secondary | ICD-10-CM | POA: Diagnosis not present

## 2015-08-20 DIAGNOSIS — I739 Peripheral vascular disease, unspecified: Secondary | ICD-10-CM | POA: Diagnosis not present

## 2015-08-20 DIAGNOSIS — B351 Tinea unguium: Secondary | ICD-10-CM | POA: Diagnosis not present

## 2015-08-20 DIAGNOSIS — L110 Acquired keratosis follicularis: Secondary | ICD-10-CM | POA: Diagnosis not present

## 2015-09-01 DIAGNOSIS — Z1389 Encounter for screening for other disorder: Secondary | ICD-10-CM | POA: Diagnosis not present

## 2015-09-01 DIAGNOSIS — Z6841 Body Mass Index (BMI) 40.0 and over, adult: Secondary | ICD-10-CM | POA: Diagnosis not present

## 2015-09-01 DIAGNOSIS — J0100 Acute maxillary sinusitis, unspecified: Secondary | ICD-10-CM | POA: Diagnosis not present

## 2015-10-29 DIAGNOSIS — I739 Peripheral vascular disease, unspecified: Secondary | ICD-10-CM | POA: Diagnosis not present

## 2015-10-29 DIAGNOSIS — B351 Tinea unguium: Secondary | ICD-10-CM | POA: Diagnosis not present

## 2015-10-29 DIAGNOSIS — L110 Acquired keratosis follicularis: Secondary | ICD-10-CM | POA: Diagnosis not present

## 2015-11-13 ENCOUNTER — Ambulatory Visit (INDEPENDENT_AMBULATORY_CARE_PROVIDER_SITE_OTHER): Payer: Medicare Other | Admitting: Ophthalmology

## 2015-11-13 DIAGNOSIS — H43813 Vitreous degeneration, bilateral: Secondary | ICD-10-CM

## 2015-11-13 DIAGNOSIS — H338 Other retinal detachments: Secondary | ICD-10-CM | POA: Diagnosis not present

## 2015-11-13 DIAGNOSIS — D3131 Benign neoplasm of right choroid: Secondary | ICD-10-CM | POA: Diagnosis not present

## 2015-11-13 DIAGNOSIS — I10 Essential (primary) hypertension: Secondary | ICD-10-CM | POA: Diagnosis not present

## 2015-11-13 DIAGNOSIS — H35033 Hypertensive retinopathy, bilateral: Secondary | ICD-10-CM

## 2015-11-13 DIAGNOSIS — H35373 Puckering of macula, bilateral: Secondary | ICD-10-CM | POA: Diagnosis not present

## 2015-11-18 DIAGNOSIS — L0100 Impetigo, unspecified: Secondary | ICD-10-CM | POA: Diagnosis not present

## 2015-11-18 DIAGNOSIS — L0231 Cutaneous abscess of buttock: Secondary | ICD-10-CM | POA: Diagnosis not present

## 2015-11-19 DIAGNOSIS — Z5189 Encounter for other specified aftercare: Secondary | ICD-10-CM | POA: Diagnosis not present

## 2015-12-02 DIAGNOSIS — L03317 Cellulitis of buttock: Secondary | ICD-10-CM | POA: Diagnosis not present

## 2015-12-15 DIAGNOSIS — I10 Essential (primary) hypertension: Secondary | ICD-10-CM | POA: Diagnosis not present

## 2015-12-15 DIAGNOSIS — F419 Anxiety disorder, unspecified: Secondary | ICD-10-CM | POA: Diagnosis not present

## 2015-12-15 DIAGNOSIS — Z6841 Body Mass Index (BMI) 40.0 and over, adult: Secondary | ICD-10-CM | POA: Diagnosis not present

## 2015-12-15 DIAGNOSIS — E782 Mixed hyperlipidemia: Secondary | ICD-10-CM | POA: Diagnosis not present

## 2015-12-15 DIAGNOSIS — I2510 Atherosclerotic heart disease of native coronary artery without angina pectoris: Secondary | ICD-10-CM | POA: Diagnosis not present

## 2015-12-15 DIAGNOSIS — Z1389 Encounter for screening for other disorder: Secondary | ICD-10-CM | POA: Diagnosis not present

## 2015-12-16 DIAGNOSIS — E782 Mixed hyperlipidemia: Secondary | ICD-10-CM | POA: Diagnosis not present

## 2015-12-16 DIAGNOSIS — I10 Essential (primary) hypertension: Secondary | ICD-10-CM | POA: Diagnosis not present

## 2015-12-16 DIAGNOSIS — Z1389 Encounter for screening for other disorder: Secondary | ICD-10-CM | POA: Diagnosis not present

## 2015-12-16 DIAGNOSIS — F419 Anxiety disorder, unspecified: Secondary | ICD-10-CM | POA: Diagnosis not present

## 2016-01-04 ENCOUNTER — Ambulatory Visit (INDEPENDENT_AMBULATORY_CARE_PROVIDER_SITE_OTHER): Payer: Medicare Other | Admitting: Internal Medicine

## 2016-01-04 ENCOUNTER — Encounter: Payer: Self-pay | Admitting: Internal Medicine

## 2016-01-04 VITALS — BP 120/56 | HR 61 | Ht 68.0 in | Wt 265.0 lb

## 2016-01-04 DIAGNOSIS — I10 Essential (primary) hypertension: Secondary | ICD-10-CM

## 2016-01-04 DIAGNOSIS — G4733 Obstructive sleep apnea (adult) (pediatric): Secondary | ICD-10-CM | POA: Diagnosis not present

## 2016-01-04 DIAGNOSIS — R42 Dizziness and giddiness: Secondary | ICD-10-CM

## 2016-01-04 NOTE — Patient Instructions (Signed)
Medication Instructions:  Your physician recommends that you continue on your current medications as directed. Please refer to the Current Medication list given to you today.  Labwork: None ordered  Testing/Procedures: None ordered  Follow-Up: Your physician wants you to follow-up in: [place]. You will receive a reminder letter in the mail two months in advance. If you don't receive a letter, please call our office to schedule the follow-up appointment.  Any Other Special Instructions Will Be Listed Below (If Applicable).     If you need a refill on your cardiac medications before your next appointment, please call your pharmacy.

## 2016-01-04 NOTE — Progress Notes (Signed)
OFFICE NOTE  Chief Complaint:  No complaints  Primary Care Physician: Purvis Kilts, MD  HPI:  Paul Lam is a 75 year old gentleman with a history of coronary disease with an EF of about 50-55%, also hypertension, dyslipidemia and sleep apnea on CPAP. Recently his EF had been reported to be lower, however, it is actually fairly normal. Overall he has done very well. No chest pain, shortness of breath, palpitations, presyncope or syncopal symptoms. He did have other non-cardiac problems over the past year, including a detached retina, skin cancer with grafting to the left ear, as well as a total knee replacement. He tolerated these surgeries well without any evidence of MI. His only concern today is some positional dizziness. He says that he gets dizzy when he bends over too quickly and sits up. He also gets dizzy sometimes when turning his head to the side. It was thought this might be due to positional orthostasis and his Lasix was held recently but there has been no significant change in his symptoms. During his episodes he also feels like there is some spinning of the room which sounds like vertigo to me.  Paul Lam returns today he is doing fairly well. He has felt somewhat fatigued recently however and it is noted that he is bradycardic today. He still get some positional dizziness and had one episode of vertigo. He was seen at [place] and they thought that there was possibly some insufficiency to the brain may be vertebrobasilar insufficiency that was leading to his vestibular problems.  Some Paul Lam back in the office today. Overall he is doing fairly well. He continues to struggle with vertigo and positional dizziness, mainly when bending over. He has chronic lower extremity edema which is likely due to venous hypertension. I've offered venous Dopplers before but he seems to be asymptomatic with it and he is declined. He's followed for sleep apnea by Dr. Radford Pax  and saw her recently and seems to be fairly compliant with this regimen. He takes furosemide several times a week for swelling.  01/04/2016  Paul Lam returns today for follow-up. He denies any chest pain or worsening shortness of breath. Unfortunately weight is up somewhat. He is not exercising regularly. Blood pressure is well-controlled at 120/56. He had recent follow-up with his primary care provider reported his cholesterol is at goal as well. He is going to see his urologist in a few weeks. He is compliant with CPAP and generally feels that he sleeps well at night.  PMHx:  Past Medical History:  Diagnosis Date  . Anginal pain ([place])   . Anxiety   . Arthritis    generalized arthritis   . CAD (coronary artery disease)   . Cancer ([place])    skin cancer removed left ear    . Chronic kidney disease    hx of kidney stones   . Detached retina   . History of echocardiogram 02/19/2009   EF 50-55%; LA mild-mod dilated;   . History of nuclear stress test 02/19/2009   low risk, normal   . Hyperlipidemia   . Hypertension   . Obesity (BMI 30-39.9)   . OSA (obstructive sleep apnea)    severe with AHI 25/hr now on BiPAP  . Shortness of breath    related to weight   . Sleep apnea   . Vertigo     Past Surgical History:  Procedure Laterality Date  . APPENDECTOMY    . CARDIAC CATHETERIZATION  07/13/2005  noncritical CAD  . EYE SURGERY     detached retina on right eye 11/12   . FLEXIBLE SIGMOIDOSCOPY N/A 07/14/2015   Procedure: FLEXIBLE SIGMOIDOSCOPY;  Surgeon: Aviva Signs, MD;  [facility];  Service: Gastroenterology;  Laterality: N/A;  . OTHER SURGICAL HISTORY     left ear skin ca removed  2 weeks ago - not completeely healed 08/15/11   . TONSILLECTOMY    . TOTAL KNEE ARTHROPLASTY  08/22/2011   Procedure: TOTAL KNEE ARTHROPLASTY;  Surgeon: Gearlean Alf, MD;  Location: [facility];  Service: Orthopedics;  Laterality: Right;    FAMHx:  Family History  Problem Relation Age of  Onset  . Heart failure Mother     also arthritis  . Stroke Father     also emphysema  . Heart failure Father     SOCHx:   reports that he has never smoked. He has never used smokeless tobacco. He reports that he drinks alcohol. He reports that he does not use drugs.  ALLERGIES:  No Known Allergies  ROS: Pertinent items noted in HPI and remainder of comprehensive ROS otherwise negative.  HOME MEDS: Current Outpatient Prescriptions  Medication Sig Dispense Refill  . ALPRAZolam (XANAX) 0.5 MG tablet Take 0.5 mg by mouth 3 (three) times daily as needed. Anxiety     . CHERRY PO Take 1,200 mg by mouth daily.     Marland Kitchen CLOBETASOL PROPIONATE E 0.05 % emollient cream Apply 1 application topically 2 (two) times daily.     Marland Kitchen docusate sodium (COLACE) 100 MG capsule Take 100 mg by mouth 2 (two) times daily.    . finasteride (PROSCAR) 5 MG tablet Take 5 mg by mouth at bedtime.  10  . fluticasone (FLONASE) 50 MCG/ACT nasal spray Place 1 spray into both nostrils daily as needed for allergies.     . furosemide (LASIX) 20 MG tablet Take 20 mg by mouth 2 (two) times a week.     Marland Kitchen KRILL OIL PO Take 1 capsule by mouth daily.    Marland Kitchen lisinopril (PRINIVIL,ZESTRIL) 10 MG tablet Take 10 mg by mouth daily.     . Methylcellulose, Laxative, (CITRUCEL PO) Take 1 capsule by mouth daily.    . nabumetone (RELAFEN) 750 MG tablet Take 750 mg by mouth 2 (two) times daily.    . NON FORMULARY at bedtime. VPAP    . potassium chloride (K-DUR) 10 MEQ tablet Take 1 tablet by mouth 2 (two) times a week. Take with Lasix  0  . simvastatin (ZOCOR) 40 MG tablet Take 40 mg by mouth at bedtime.    . SSD 1 % cream Apply 1 application topically daily.  0  . Tamsulosin HCl (FLOMAX) 0.4 MG CAPS Take 0.4 mg by mouth daily. Pt takes at lunchtime    . vitamin E 400 UNIT capsule Take 400 Units by mouth daily.     No current facility-administered medications for this visit.     LABS/IMAGING: No results found for this or any previous  visit (from the past 48 hour(s)). No results found.  VITALS: BP (!) 120/56 (BP Location: Right Arm, Patient Position: Sitting, Cuff Size: Large)   Pulse 61   Ht 5\' 8"  (1.727 m)   Wt 265 lb (120.2 kg)   SpO2 98%   BMI 40.29 kg/m   EXAM: General appearance: alert and no distress Neck: no adenopathy, no carotid bruit, no JVD, supple, symmetrical, trachea midline and thyroid not enlarged, symmetric, no tenderness/mass/nodules Lungs: clear to  auscultation bilaterally Heart: regular rate and rhythm, S1, S2 normal, no murmur, click, rub or gallop Abdomen: soft, non-tender; bowel sounds normal; no masses,  no organomegaly and obese Extremities: edema 1+ bilateral pitting Pulses: 2+ and symmetric Skin: Skin color, texture, turgor normal. No rashes or lesions Neurologic: Grossly normal  EKG: Normal sinus rhythm at 61  ASSESSMENT: 1. Hypertension 2. Dyslipidemia 3. Obesity 4. Dizziness 5. Anxiety 6. OSA on CPAP 7. Chronic lower extremity edema secondary to venous hypertension  PLAN: 1.   Paul Lam is doing well although is not as active as he should be. Of encouraged him to work on more activity which should help with his weight. It is now increased to a BMI greater than 40. He continues to be compliant with his CPAP. Pressures at goal. Cholesterol is been followed by his primary care provider and reportedly is well-controlled. He continues to have a mild amount of lower extremity edema and takes Lasix a few times a week. This is likely secondary to venous hypertension and increased right heart pressures. Follow-up with me annually or sooner as necessary.  Pixie Casino, MD, [facility] Attending Cardiologist Kinta C Ananiah Maciolek 01/04/2016, 12:40 PM

## 2016-01-06 ENCOUNTER — Ambulatory Visit (INDEPENDENT_AMBULATORY_CARE_PROVIDER_SITE_OTHER): Payer: Medicare Other | Admitting: Urology

## 2016-01-06 DIAGNOSIS — N5201 Erectile dysfunction due to arterial insufficiency: Secondary | ICD-10-CM | POA: Diagnosis not present

## 2016-01-06 DIAGNOSIS — R351 Nocturia: Secondary | ICD-10-CM

## 2016-01-06 DIAGNOSIS — N401 Enlarged prostate with lower urinary tract symptoms: Secondary | ICD-10-CM

## 2016-01-14 DIAGNOSIS — I739 Peripheral vascular disease, unspecified: Secondary | ICD-10-CM | POA: Diagnosis not present

## 2016-01-14 DIAGNOSIS — B351 Tinea unguium: Secondary | ICD-10-CM | POA: Diagnosis not present

## 2016-01-14 DIAGNOSIS — L110 Acquired keratosis follicularis: Secondary | ICD-10-CM | POA: Diagnosis not present

## 2016-02-22 DIAGNOSIS — L309 Dermatitis, unspecified: Secondary | ICD-10-CM | POA: Diagnosis not present

## 2016-02-22 DIAGNOSIS — Z85828 Personal history of other malignant neoplasm of skin: Secondary | ICD-10-CM | POA: Diagnosis not present

## 2016-02-22 DIAGNOSIS — L570 Actinic keratosis: Secondary | ICD-10-CM | POA: Diagnosis not present

## 2016-03-31 DIAGNOSIS — B351 Tinea unguium: Secondary | ICD-10-CM | POA: Diagnosis not present

## 2016-03-31 DIAGNOSIS — L110 Acquired keratosis follicularis: Secondary | ICD-10-CM | POA: Diagnosis not present

## 2016-03-31 DIAGNOSIS — I739 Peripheral vascular disease, unspecified: Secondary | ICD-10-CM | POA: Diagnosis not present

## 2016-04-21 DIAGNOSIS — Z6841 Body Mass Index (BMI) 40.0 and over, adult: Secondary | ICD-10-CM | POA: Diagnosis not present

## 2016-04-21 DIAGNOSIS — E782 Mixed hyperlipidemia: Secondary | ICD-10-CM | POA: Diagnosis not present

## 2016-04-21 DIAGNOSIS — F419 Anxiety disorder, unspecified: Secondary | ICD-10-CM | POA: Diagnosis not present

## 2016-04-21 DIAGNOSIS — Z79899 Other long term (current) drug therapy: Secondary | ICD-10-CM | POA: Diagnosis not present

## 2016-04-21 DIAGNOSIS — Z1389 Encounter for screening for other disorder: Secondary | ICD-10-CM | POA: Diagnosis not present

## 2016-04-21 DIAGNOSIS — I10 Essential (primary) hypertension: Secondary | ICD-10-CM | POA: Diagnosis not present

## 2016-04-21 DIAGNOSIS — E669 Obesity, unspecified: Secondary | ICD-10-CM | POA: Diagnosis not present

## 2016-04-21 DIAGNOSIS — Z23 Encounter for immunization: Secondary | ICD-10-CM | POA: Diagnosis not present

## 2016-06-16 DIAGNOSIS — L110 Acquired keratosis follicularis: Secondary | ICD-10-CM | POA: Diagnosis not present

## 2016-06-16 DIAGNOSIS — I739 Peripheral vascular disease, unspecified: Secondary | ICD-10-CM | POA: Diagnosis not present

## 2016-06-16 DIAGNOSIS — B351 Tinea unguium: Secondary | ICD-10-CM | POA: Diagnosis not present

## 2016-07-29 DIAGNOSIS — M5417 Radiculopathy, lumbosacral region: Secondary | ICD-10-CM | POA: Diagnosis not present

## 2016-07-29 DIAGNOSIS — J329 Chronic sinusitis, unspecified: Secondary | ICD-10-CM | POA: Diagnosis not present

## 2016-07-29 DIAGNOSIS — M47816 Spondylosis without myelopathy or radiculopathy, lumbar region: Secondary | ICD-10-CM | POA: Diagnosis not present

## 2016-07-29 DIAGNOSIS — Z1389 Encounter for screening for other disorder: Secondary | ICD-10-CM | POA: Diagnosis not present

## 2016-07-29 DIAGNOSIS — Z6841 Body Mass Index (BMI) 40.0 and over, adult: Secondary | ICD-10-CM | POA: Diagnosis not present

## 2016-08-01 DIAGNOSIS — M5442 Lumbago with sciatica, left side: Secondary | ICD-10-CM | POA: Diagnosis not present

## 2016-08-01 DIAGNOSIS — G8929 Other chronic pain: Secondary | ICD-10-CM | POA: Diagnosis not present

## 2016-08-09 ENCOUNTER — Ambulatory Visit (HOSPITAL_COMMUNITY)
Admission: RE | Admit: 2016-08-09 | Discharge: 2016-08-09 | Disposition: A | Discharge status: Home / Self Care | Payer: Medicare Other | Source: Ambulatory Visit | Attending: Orthopedic Surgery | Admitting: Orthopedic Surgery

## 2016-08-09 ENCOUNTER — Other Ambulatory Visit (HOSPITAL_COMMUNITY): Payer: Self-pay | Admitting: Orthopedic Surgery

## 2016-08-09 DIAGNOSIS — Z01818 Encounter for other preprocedural examination: Secondary | ICD-10-CM | POA: Diagnosis not present

## 2016-08-09 DIAGNOSIS — Z135 Encounter for screening for eye and ear disorders: Secondary | ICD-10-CM | POA: Insufficient documentation

## 2016-08-10 ENCOUNTER — Ambulatory Visit (INDEPENDENT_AMBULATORY_CARE_PROVIDER_SITE_OTHER): Payer: Medicare Other | Admitting: Urology

## 2016-08-10 DIAGNOSIS — N5201 Erectile dysfunction due to arterial insufficiency: Secondary | ICD-10-CM

## 2016-08-10 DIAGNOSIS — N401 Enlarged prostate with lower urinary tract symptoms: Secondary | ICD-10-CM | POA: Diagnosis not present

## 2016-08-11 DIAGNOSIS — J329 Chronic sinusitis, unspecified: Secondary | ICD-10-CM | POA: Diagnosis not present

## 2016-08-11 DIAGNOSIS — J181 Lobar pneumonia, unspecified organism: Secondary | ICD-10-CM | POA: Diagnosis not present

## 2016-08-12 DIAGNOSIS — M5442 Lumbago with sciatica, left side: Secondary | ICD-10-CM | POA: Diagnosis not present

## 2016-08-12 DIAGNOSIS — G8929 Other chronic pain: Secondary | ICD-10-CM | POA: Diagnosis not present

## 2016-08-16 DIAGNOSIS — G8929 Other chronic pain: Secondary | ICD-10-CM | POA: Diagnosis not present

## 2016-08-16 DIAGNOSIS — M5442 Lumbago with sciatica, left side: Secondary | ICD-10-CM | POA: Diagnosis not present

## 2016-08-22 DIAGNOSIS — Z85828 Personal history of other malignant neoplasm of skin: Secondary | ICD-10-CM | POA: Diagnosis not present

## 2016-08-22 DIAGNOSIS — D485 Neoplasm of uncertain behavior of skin: Secondary | ICD-10-CM | POA: Diagnosis not present

## 2016-08-22 DIAGNOSIS — L570 Actinic keratosis: Secondary | ICD-10-CM | POA: Diagnosis not present

## 2016-09-01 DIAGNOSIS — L110 Acquired keratosis follicularis: Secondary | ICD-10-CM | POA: Diagnosis not present

## 2016-09-01 DIAGNOSIS — I739 Peripheral vascular disease, unspecified: Secondary | ICD-10-CM | POA: Diagnosis not present

## 2016-09-01 DIAGNOSIS — B351 Tinea unguium: Secondary | ICD-10-CM | POA: Diagnosis not present

## 2016-11-02 DIAGNOSIS — Z6841 Body Mass Index (BMI) 40.0 and over, adult: Secondary | ICD-10-CM | POA: Diagnosis not present

## 2016-11-02 DIAGNOSIS — F4320 Adjustment disorder, unspecified: Secondary | ICD-10-CM | POA: Diagnosis not present

## 2016-11-10 DIAGNOSIS — I739 Peripheral vascular disease, unspecified: Secondary | ICD-10-CM | POA: Diagnosis not present

## 2016-11-10 DIAGNOSIS — L110 Acquired keratosis follicularis: Secondary | ICD-10-CM | POA: Diagnosis not present

## 2016-11-10 DIAGNOSIS — B351 Tinea unguium: Secondary | ICD-10-CM | POA: Diagnosis not present

## 2016-11-14 ENCOUNTER — Ambulatory Visit (INDEPENDENT_AMBULATORY_CARE_PROVIDER_SITE_OTHER): Payer: Medicare Other | Admitting: Ophthalmology

## 2016-11-14 DIAGNOSIS — H43813 Vitreous degeneration, bilateral: Secondary | ICD-10-CM | POA: Diagnosis not present

## 2016-11-14 DIAGNOSIS — D3131 Benign neoplasm of right choroid: Secondary | ICD-10-CM | POA: Diagnosis not present

## 2016-11-14 DIAGNOSIS — H35373 Puckering of macula, bilateral: Secondary | ICD-10-CM | POA: Diagnosis not present

## 2016-11-14 DIAGNOSIS — H35033 Hypertensive retinopathy, bilateral: Secondary | ICD-10-CM

## 2016-11-14 DIAGNOSIS — H338 Other retinal detachments: Secondary | ICD-10-CM

## 2016-11-14 DIAGNOSIS — I10 Essential (primary) hypertension: Secondary | ICD-10-CM

## 2016-12-28 DIAGNOSIS — Z6839 Body mass index (BMI) 39.0-39.9, adult: Secondary | ICD-10-CM | POA: Diagnosis not present

## 2016-12-28 DIAGNOSIS — F329 Major depressive disorder, single episode, unspecified: Secondary | ICD-10-CM | POA: Diagnosis not present

## 2016-12-28 DIAGNOSIS — D1779 Benign lipomatous neoplasm of other sites: Secondary | ICD-10-CM | POA: Diagnosis not present

## 2016-12-28 DIAGNOSIS — Z1389 Encounter for screening for other disorder: Secondary | ICD-10-CM | POA: Diagnosis not present

## 2016-12-28 DIAGNOSIS — K469 Unspecified abdominal hernia without obstruction or gangrene: Secondary | ICD-10-CM | POA: Diagnosis not present

## 2017-01-03 DIAGNOSIS — K8020 Calculus of gallbladder without cholecystitis without obstruction: Secondary | ICD-10-CM | POA: Diagnosis not present

## 2017-01-03 DIAGNOSIS — Z6839 Body mass index (BMI) 39.0-39.9, adult: Secondary | ICD-10-CM | POA: Diagnosis not present

## 2017-01-03 DIAGNOSIS — I700 Atherosclerosis of aorta: Secondary | ICD-10-CM | POA: Diagnosis not present

## 2017-01-03 DIAGNOSIS — K469 Unspecified abdominal hernia without obstruction or gangrene: Secondary | ICD-10-CM | POA: Diagnosis not present

## 2017-01-03 DIAGNOSIS — D1779 Benign lipomatous neoplasm of other sites: Secondary | ICD-10-CM | POA: Diagnosis not present

## 2017-01-03 DIAGNOSIS — Z1389 Encounter for screening for other disorder: Secondary | ICD-10-CM | POA: Diagnosis not present

## 2017-01-23 ENCOUNTER — Ambulatory Visit (INDEPENDENT_AMBULATORY_CARE_PROVIDER_SITE_OTHER): Payer: Medicare Other | Admitting: Internal Medicine

## 2017-01-23 ENCOUNTER — Encounter: Payer: Self-pay | Admitting: Internal Medicine

## 2017-01-23 VITALS — BP 116/50 | HR 60 | Ht 68.0 in | Wt 239.0 lb

## 2017-01-23 DIAGNOSIS — G4733 Obstructive sleep apnea (adult) (pediatric): Secondary | ICD-10-CM | POA: Diagnosis not present

## 2017-01-23 DIAGNOSIS — I10 Essential (primary) hypertension: Secondary | ICD-10-CM

## 2017-01-23 NOTE — Patient Instructions (Signed)
Your physician wants you to follow-up in: ONE YEAR WITH DR HILTY You will receive a reminder letter in the mail two months in advance. If you don't receive a letter, please call our office to schedule the follow-up appointment.  If you need a refill on your cardiac medications before your next appointment, please call your pharmacy.   

## 2017-01-23 NOTE — Progress Notes (Signed)
OFFICE NOTE  Chief Complaint:  No complaints  Primary Care Physician: [name], MD  HPI:  Paul Lam is a 76 year old gentleman with a history of coronary disease with an EF of about 50-55%, also hypertension, dyslipidemia and sleep apnea on CPAP. Recently his EF had been reported to be lower, however, it is actually fairly normal. Overall he has done very well. No chest pain, shortness of breath, palpitations, presyncope or syncopal symptoms. He did have other non-cardiac problems over the past year, including a detached retina, skin cancer with grafting to the left ear, as well as a total knee replacement. He tolerated these surgeries well without any evidence of MI. His only concern today is some positional dizziness. He says that he gets dizzy when he bends over too quickly and sits up. He also gets dizzy sometimes when turning his head to the side. It was thought this might be due to positional orthostasis and his Lasix was held recently but there has been no significant change in his symptoms. During his episodes he also feels like there is some spinning of the room which sounds like vertigo to me.  Paul Lam returns today he is doing fairly well. He has felt somewhat fatigued recently however and it is noted that he is bradycardic today. He still get some positional dizziness and had one episode of vertigo. He was seen at [facility] and they thought that there was possibly some insufficiency to the brain may be vertebrobasilar insufficiency that was leading to his vestibular problems.  Some Paul Lam back in the office today. Overall he is doing fairly well. He continues to struggle with vertigo and positional dizziness, mainly when bending over. He has chronic lower extremity edema which is likely due to venous hypertension. I've offered venous Dopplers before but he seems to be asymptomatic with it and he is declined. He's followed for sleep apnea by Dr. Radford Pax and saw  her recently and seems to be fairly compliant with this regimen. He takes furosemide several times a week for swelling.  01/04/2016  Paul Lam returns today for follow-up. He denies any chest pain or worsening shortness of breath. Unfortunately weight is up somewhat. He is not exercising regularly. Blood pressure is well-controlled at 120/56. He had recent follow-up with his primary care provider reported his cholesterol is at goal as well. He is going to see his urologist in a few weeks. He is compliant with CPAP and generally feels that he sleeps well at night.  01/23/2017  Paul Lam was seen today in follow-up. Today he is very sad and is accompanied by his wife. Their grandson recently committed suicide. I was to commend him on his weight loss, but I suspect the weight loss is been due to poor appetite since his grieving. He's also had some depression is now on medications. Overall he denies any chest pain or worsening shortness of breath. His blood pressure actually looks much better today, probably attributed to the weight loss. EKG is sinus rhythm.  PMHx:  Past Medical History:  Diagnosis Date  . Anginal pain ([place])   . Anxiety   . Arthritis    generalized arthritis   . CAD (coronary artery disease)   . Cancer ([place])    skin cancer removed left ear    . Chronic kidney disease    hx of kidney stones   . Detached retina   . History of echocardiogram 02/19/2009   EF 50-55%; LA mild-mod dilated;   Marland Kitchen  History of nuclear stress test 02/19/2009   low risk, normal   . Hyperlipidemia   . Hypertension   . Obesity (BMI 30-39.9)   . OSA (obstructive sleep apnea)    severe with AHI 25/hr now on BiPAP  . Shortness of breath    related to weight   . Sleep apnea   . Vertigo     Past Surgical History:  Procedure Laterality Date  . APPENDECTOMY    . CARDIAC CATHETERIZATION  07/13/2005   noncritical CAD  . EYE SURGERY     detached retina on right eye 11/12   . FLEXIBLE SIGMOIDOSCOPY N/A  07/14/2015   Procedure: FLEXIBLE SIGMOIDOSCOPY;  Surgeon: Aviva Signs, MD;  [facility];  Service: Gastroenterology;  Laterality: N/A;  . OTHER SURGICAL HISTORY     left ear skin ca removed  2 weeks ago - not completeely healed 08/15/11   . TONSILLECTOMY    . TOTAL KNEE ARTHROPLASTY  08/22/2011   Procedure: TOTAL KNEE ARTHROPLASTY;  Surgeon: Gearlean Alf, MD;  Location: [facility];  Service: Orthopedics;  Laterality: Right;    FAMHx:  Family History  Problem Relation Age of Onset  . Heart failure Mother        also arthritis  . Stroke Father        also emphysema  . Heart failure Father     SOCHx:   reports that he has never smoked. He has never used smokeless tobacco. He reports that he drinks alcohol. He reports that he does not use drugs.  ALLERGIES:  No Known Allergies  ROS: Pertinent items noted in HPI and remainder of comprehensive ROS otherwise negative.  HOME MEDS: Current Outpatient Prescriptions  Medication Sig Dispense Refill  . ALPRAZolam (XANAX) 0.5 MG tablet Take 0.5 mg by mouth daily. Anxiety     . docusate sodium (COLACE) 100 MG capsule Take 100 mg by mouth 3 (three) times daily.     . finasteride (PROSCAR) 5 MG tablet Take 5 mg by mouth at bedtime.  10  . furosemide (LASIX) 20 MG tablet Take 20 mg by mouth as needed.     Marland Kitchen lisinopril (PRINIVIL,ZESTRIL) 10 MG tablet Take 10 mg by mouth daily.     . Methylcellulose, Laxative, (CITRUCEL PO) Take 1 capsule by mouth daily.    . nabumetone (RELAFEN) 750 MG tablet Take 750 mg by mouth daily.     . NON FORMULARY at bedtime. VPAP    . Omega-3 Fatty Acids (OMEGA-3 FISH OIL PO) Take 660 mg by mouth daily.    . potassium chloride (K-DUR) 10 MEQ tablet Take 1 tablet by mouth once a week. Take with Lasix  0  . simvastatin (ZOCOR) 40 MG tablet Take 40 mg by mouth at bedtime.    . Tamsulosin HCl (FLOMAX) 0.4 MG CAPS Take 0.4 mg by mouth 2 (two) times daily. Pt takes at lunchtime    . vitamin E 400 UNIT capsule  Take 400 Units by mouth daily.     No current facility-administered medications for this visit.     LABS/IMAGING: No results found for this or any previous visit (from the past 48 hour(s)). No results found.  VITALS: BP (!) 116/50   Pulse 60   Ht 5\' 8"  (1.727 m)   Wt 239 lb (108.4 kg)   BMI 36.34 kg/m    EXAM: General appearance: alert and no distress Neck: no carotid bruit, no JVD and thyroid not enlarged, symmetric, no tenderness/mass/nodules Lungs: clear  to auscultation bilaterally Heart: regular rate and rhythm, S1, S2 normal, no murmur, click, rub or gallop Abdomen: soft, non-tender; bowel sounds normal; no masses,  no organomegaly Extremities: extremities normal, atraumatic, no cyanosis or edema Pulses: 2+ and symmetric Skin: Skin color, texture, turgor normal. No rashes or lesions Neurologic: GroPsych: Pleasanty normal Psych: Pleasant  EKG: Normal sinus rhythm at 60  ASSESSMENT: 1. Hypertension 2. Dyslipidemia 3. Obesity 4. Dizziness 5. Anxiety 6. OSA on CPAP 7. Chronic lower extremity edema secondary to venous hypertension  PLAN: 1.   Paul Lam is appropriately grieving the loss of his grandson. He's had recent weight loss but it was not a healthy weight loss. I've encouraged him however to make the best of the weight loss and work on healthy eating after he gets over his grieving. He denies any chest pain or worsening shortness of breath. He's had some nasal congestion on CPAP and advise using a nasal steroid for short period of time see if it improves his symptoms. He could get over-the-counter Flonase. As necessary.  Pixie Casino, MD, [facility] Attending Cardiologist [place] 01/23/2017, 1:11 PM

## 2017-02-02 DIAGNOSIS — I739 Peripheral vascular disease, unspecified: Secondary | ICD-10-CM | POA: Diagnosis not present

## 2017-02-02 DIAGNOSIS — L110 Acquired keratosis follicularis: Secondary | ICD-10-CM | POA: Diagnosis not present

## 2017-02-02 DIAGNOSIS — B351 Tinea unguium: Secondary | ICD-10-CM | POA: Diagnosis not present

## 2017-02-15 ENCOUNTER — Ambulatory Visit (INDEPENDENT_AMBULATORY_CARE_PROVIDER_SITE_OTHER): Payer: Medicare Other | Admitting: Urology

## 2017-02-15 DIAGNOSIS — N5201 Erectile dysfunction due to arterial insufficiency: Secondary | ICD-10-CM | POA: Diagnosis not present

## 2017-02-15 DIAGNOSIS — N401 Enlarged prostate with lower urinary tract symptoms: Secondary | ICD-10-CM

## 2017-02-22 DIAGNOSIS — L570 Actinic keratosis: Secondary | ICD-10-CM | POA: Diagnosis not present

## 2017-03-02 DIAGNOSIS — Z6838 Body mass index (BMI) 38.0-38.9, adult: Secondary | ICD-10-CM | POA: Diagnosis not present

## 2017-03-02 DIAGNOSIS — F419 Anxiety disorder, unspecified: Secondary | ICD-10-CM | POA: Diagnosis not present

## 2017-03-02 DIAGNOSIS — Z23 Encounter for immunization: Secondary | ICD-10-CM | POA: Diagnosis not present

## 2017-03-20 DIAGNOSIS — H35373 Puckering of macula, bilateral: Secondary | ICD-10-CM | POA: Diagnosis not present

## 2017-03-20 DIAGNOSIS — H524 Presbyopia: Secondary | ICD-10-CM | POA: Diagnosis not present

## 2017-03-20 DIAGNOSIS — Z961 Presence of intraocular lens: Secondary | ICD-10-CM | POA: Diagnosis not present

## 2017-04-10 DIAGNOSIS — F419 Anxiety disorder, unspecified: Secondary | ICD-10-CM | POA: Diagnosis not present

## 2017-04-10 DIAGNOSIS — Z6838 Body mass index (BMI) 38.0-38.9, adult: Secondary | ICD-10-CM | POA: Diagnosis not present

## 2017-04-17 ENCOUNTER — Other Ambulatory Visit (INDEPENDENT_AMBULATORY_CARE_PROVIDER_SITE_OTHER): Payer: Self-pay | Admitting: Otolaryngology

## 2017-04-17 ENCOUNTER — Ambulatory Visit (INDEPENDENT_AMBULATORY_CARE_PROVIDER_SITE_OTHER): Payer: Medicare Other | Admitting: Otolaryngology

## 2017-04-17 DIAGNOSIS — J342 Deviated nasal septum: Secondary | ICD-10-CM

## 2017-04-17 DIAGNOSIS — J310 Chronic rhinitis: Secondary | ICD-10-CM | POA: Diagnosis not present

## 2017-04-17 DIAGNOSIS — J343 Hypertrophy of nasal turbinates: Secondary | ICD-10-CM | POA: Diagnosis not present

## 2017-04-17 DIAGNOSIS — J0101 Acute recurrent maxillary sinusitis: Secondary | ICD-10-CM

## 2017-04-18 ENCOUNTER — Encounter (HOSPITAL_COMMUNITY): Payer: Self-pay | Admitting: *Deleted

## 2017-04-18 ENCOUNTER — Encounter (HOSPITAL_COMMUNITY): Payer: Self-pay | Admitting: Anesthesiology

## 2017-04-18 NOTE — Progress Notes (Signed)
Patient reports that he had chest pain ~ 1 month ago and has it every once in a while. Notified Dr. Roanna Banning will evaluate tomorrow by assigned anesthesiologist. Requested patient mention chest pain to Dr. Zigmund Daniel tomorrow morning.

## 2017-04-19 ENCOUNTER — Ambulatory Visit (HOSPITAL_COMMUNITY): Admission: RE | Admit: 2017-04-19 | Payer: Medicare Other | Source: Ambulatory Visit | Admitting: Ophthalmology

## 2017-04-19 ENCOUNTER — Encounter (HOSPITAL_COMMUNITY): Admission: RE | Payer: Self-pay | Source: Ambulatory Visit

## 2017-04-19 ENCOUNTER — Encounter (INDEPENDENT_AMBULATORY_CARE_PROVIDER_SITE_OTHER): Payer: Medicare Other | Admitting: Ophthalmology

## 2017-04-19 DIAGNOSIS — I10 Essential (primary) hypertension: Secondary | ICD-10-CM

## 2017-04-19 DIAGNOSIS — H43813 Vitreous degeneration, bilateral: Secondary | ICD-10-CM

## 2017-04-19 DIAGNOSIS — H4312 Vitreous hemorrhage, left eye: Secondary | ICD-10-CM

## 2017-04-19 DIAGNOSIS — H35033 Hypertensive retinopathy, bilateral: Secondary | ICD-10-CM

## 2017-04-19 DIAGNOSIS — D3131 Benign neoplasm of right choroid: Secondary | ICD-10-CM

## 2017-04-19 DIAGNOSIS — H338 Other retinal detachments: Secondary | ICD-10-CM

## 2017-04-19 DIAGNOSIS — H33302 Unspecified retinal break, left eye: Secondary | ICD-10-CM | POA: Diagnosis not present

## 2017-04-19 HISTORY — DX: Personal history of urinary calculi: Z87.442

## 2017-04-19 SURGERY — SCLERAL BUCKLE
Anesthesia: General | Laterality: Left

## 2017-04-20 DIAGNOSIS — L110 Acquired keratosis follicularis: Secondary | ICD-10-CM | POA: Diagnosis not present

## 2017-04-20 DIAGNOSIS — B351 Tinea unguium: Secondary | ICD-10-CM | POA: Diagnosis not present

## 2017-04-20 DIAGNOSIS — I739 Peripheral vascular disease, unspecified: Secondary | ICD-10-CM | POA: Diagnosis not present

## 2017-04-26 ENCOUNTER — Ambulatory Visit (HOSPITAL_COMMUNITY)
Admission: RE | Admit: 2017-04-26 | Discharge: 2017-04-26 | Disposition: A | Discharge status: Home / Self Care | Payer: Medicare Other | Source: Ambulatory Visit | Attending: Otolaryngology | Admitting: Otolaryngology

## 2017-04-26 DIAGNOSIS — J0101 Acute recurrent maxillary sinusitis: Secondary | ICD-10-CM | POA: Diagnosis not present

## 2017-04-26 DIAGNOSIS — K1379 Other lesions of oral mucosa: Secondary | ICD-10-CM | POA: Diagnosis not present

## 2017-05-02 ENCOUNTER — Encounter (INDEPENDENT_AMBULATORY_CARE_PROVIDER_SITE_OTHER): Payer: Medicare Other | Admitting: Ophthalmology

## 2017-05-02 DIAGNOSIS — H33302 Unspecified retinal break, left eye: Secondary | ICD-10-CM

## 2017-05-08 ENCOUNTER — Ambulatory Visit (INDEPENDENT_AMBULATORY_CARE_PROVIDER_SITE_OTHER): Payer: Medicare Other | Admitting: Otolaryngology

## 2017-05-08 DIAGNOSIS — J343 Hypertrophy of nasal turbinates: Secondary | ICD-10-CM

## 2017-05-08 DIAGNOSIS — J320 Chronic maxillary sinusitis: Secondary | ICD-10-CM | POA: Diagnosis not present

## 2017-05-08 DIAGNOSIS — J342 Deviated nasal septum: Secondary | ICD-10-CM | POA: Diagnosis not present

## 2017-05-08 DIAGNOSIS — J310 Chronic rhinitis: Secondary | ICD-10-CM

## 2017-05-10 ENCOUNTER — Other Ambulatory Visit: Payer: Self-pay | Admitting: Otolaryngology

## 2017-05-22 ENCOUNTER — Ambulatory Visit (HOSPITAL_COMMUNITY)
Admission: RE | Admit: 2017-05-22 | Discharge: 2017-05-23 | Disposition: A | Discharge status: Home / Self Care | Payer: Medicare Other | Source: Ambulatory Visit | Attending: Ophthalmology | Admitting: Ophthalmology

## 2017-05-22 ENCOUNTER — Ambulatory Visit (HOSPITAL_COMMUNITY): Payer: Medicare Other | Admitting: Anesthesiology

## 2017-05-22 ENCOUNTER — Encounter (HOSPITAL_COMMUNITY): Payer: Self-pay

## 2017-05-22 ENCOUNTER — Encounter (HOSPITAL_COMMUNITY)
Admission: RE | Disposition: A | Discharge status: Home / Self Care | Payer: Self-pay | Source: Ambulatory Visit | Attending: Ophthalmology

## 2017-05-22 ENCOUNTER — Encounter (INDEPENDENT_AMBULATORY_CARE_PROVIDER_SITE_OTHER): Payer: Medicare Other | Admitting: Ophthalmology

## 2017-05-22 ENCOUNTER — Other Ambulatory Visit: Payer: Self-pay

## 2017-05-22 DIAGNOSIS — G4733 Obstructive sleep apnea (adult) (pediatric): Secondary | ICD-10-CM | POA: Insufficient documentation

## 2017-05-22 DIAGNOSIS — H33002 Unspecified retinal detachment with retinal break, left eye: Secondary | ICD-10-CM | POA: Diagnosis not present

## 2017-05-22 DIAGNOSIS — E669 Obesity, unspecified: Secondary | ICD-10-CM | POA: Diagnosis not present

## 2017-05-22 DIAGNOSIS — R001 Bradycardia, unspecified: Secondary | ICD-10-CM | POA: Diagnosis not present

## 2017-05-22 DIAGNOSIS — H44002 Unspecified purulent endophthalmitis, left eye: Secondary | ICD-10-CM | POA: Diagnosis not present

## 2017-05-22 DIAGNOSIS — H338 Other retinal detachments: Secondary | ICD-10-CM

## 2017-05-22 DIAGNOSIS — I2510 Atherosclerotic heart disease of native coronary artery without angina pectoris: Secondary | ICD-10-CM | POA: Insufficient documentation

## 2017-05-22 DIAGNOSIS — Z6839 Body mass index (BMI) 39.0-39.9, adult: Secondary | ICD-10-CM | POA: Diagnosis not present

## 2017-05-22 DIAGNOSIS — I10 Essential (primary) hypertension: Secondary | ICD-10-CM | POA: Diagnosis not present

## 2017-05-22 DIAGNOSIS — Z96651 Presence of right artificial knee joint: Secondary | ICD-10-CM | POA: Insufficient documentation

## 2017-05-22 DIAGNOSIS — E785 Hyperlipidemia, unspecified: Secondary | ICD-10-CM | POA: Diagnosis not present

## 2017-05-22 DIAGNOSIS — Z85828 Personal history of other malignant neoplasm of skin: Secondary | ICD-10-CM | POA: Insufficient documentation

## 2017-05-22 DIAGNOSIS — F419 Anxiety disorder, unspecified: Secondary | ICD-10-CM | POA: Insufficient documentation

## 2017-05-22 DIAGNOSIS — Z79899 Other long term (current) drug therapy: Secondary | ICD-10-CM | POA: Diagnosis not present

## 2017-05-22 HISTORY — DX: Headache: R51

## 2017-05-22 HISTORY — DX: Depression, unspecified: F32.A

## 2017-05-22 HISTORY — PX: SCLERAL BUCKLE WITH POSSIBLE 25 GAUGE PARS PLANA VITRECTOMY: SHX6206

## 2017-05-22 HISTORY — DX: Headache, unspecified: R51.9

## 2017-05-22 HISTORY — DX: Major depressive disorder, single episode, unspecified: F32.9

## 2017-05-22 LAB — BASIC METABOLIC PANEL
ANION GAP: 9 (ref 5–15)
BUN: 11 mg/dL (ref 6–20)
CO2: 26 mmol/L (ref 22–32)
Calcium: 8.7 mg/dL — ABNORMAL LOW (ref 8.9–10.3)
Chloride: 104 mmol/L (ref 101–111)
Creatinine, Ser: 0.61 mg/dL (ref 0.61–1.24)
GFR calc Af Amer: >60 mL/min (ref >60)
Glucose, Bld: 86 mg/dL (ref 65–99)
POTASSIUM: 3.9 mmol/L (ref 3.5–5.1)
Sodium: 139 mmol/L (ref 135–145)

## 2017-05-22 LAB — CBC
HEMATOCRIT: 41.1 % (ref 39.0–52.0)
Hemoglobin: 13.5 g/dL (ref 13.0–17.0)
MCH: 29.3 pg (ref 26.0–34.0)
MCHC: 32.8 g/dL (ref 30.0–36.0)
MCV: 89.3 fL (ref 78.0–100.0)
PLATELETS: 124 10*3/uL — AB (ref 150–400)
RBC: 4.6 MIL/uL (ref 4.22–5.81)
RDW: 13.2 % (ref 11.5–15.5)
WBC: 6.6 10*3/uL (ref 4.0–10.5)

## 2017-05-22 SURGERY — SCLERAL BUCKLE WITH POSSIBLE 25 GAUGE PARS PLANA VITRECTOMY
Anesthesia: General | Laterality: Left

## 2017-05-22 MED ORDER — BSS PLUS IO SOLN
INTRAOCULAR | Status: AC
  Filled 2017-05-22: qty 500

## 2017-05-22 MED ORDER — BSS IO SOLN
INTRAOCULAR | Status: AC
  Filled 2017-05-22: qty 30

## 2017-05-22 MED ORDER — PROPOFOL 10 MG/ML IV BOLUS
INTRAVENOUS | Status: AC
  Filled 2017-05-22: qty 20

## 2017-05-22 MED ORDER — STERILE WATER FOR INJECTION IJ SOLN
INTRAMUSCULAR | Status: DC | PRN
  Administered 2017-05-22: 20:00:00

## 2017-05-22 MED ORDER — ACETAZOLAMIDE SODIUM 500 MG IJ SOLR
500.0000 mg | Freq: Once | INTRAMUSCULAR | Status: AC
  Administered 2017-05-23: 500 mg via INTRAVENOUS
  Filled 2017-05-22: qty 500

## 2017-05-22 MED ORDER — ONDANSETRON HCL 4 MG/2ML IJ SOLN: 4.0000 mg | Freq: Four times a day (QID) | INTRAMUSCULAR | Status: DC

## 2017-05-22 MED ORDER — TETRACAINE HCL 0.5 % OP SOLN
2.0000 [drp] | Freq: Once | OPHTHALMIC | Status: DC
  Filled 2017-05-22: qty 4

## 2017-05-22 MED ORDER — DEXAMETHASONE SODIUM PHOSPHATE 10 MG/ML IJ SOLN
INTRAMUSCULAR | Status: AC
  Filled 2017-05-22: qty 1

## 2017-05-22 MED ORDER — ESCITALOPRAM OXALATE 10 MG PO TABS: 5.0000 mg | ORAL_TABLET | Freq: Every day | ORAL | Status: DC

## 2017-05-22 MED ORDER — GATIFLOXACIN 0.5 % OP SOLN
1.0000 [drp] | OPHTHALMIC | Status: AC | PRN
  Administered 2017-05-22 (×3): 1 [drp] via OPHTHALMIC
  Filled 2017-05-22: qty 2.5

## 2017-05-22 MED ORDER — TROPICAMIDE 1 % OP SOLN: 1.0000 [drp] | OPHTHALMIC | Status: DC | PRN

## 2017-05-22 MED ORDER — POTASSIUM CHLORIDE ER 10 MEQ PO TBCR
10.0000 meq | EXTENDED_RELEASE_TABLET | Freq: Every day | ORAL | Status: DC | PRN
  Filled 2017-05-22: qty 1

## 2017-05-22 MED ORDER — FINASTERIDE 5 MG PO TABS: 5.0000 mg | ORAL_TABLET | Freq: Every day | ORAL | Status: DC

## 2017-05-22 MED ORDER — MEPERIDINE HCL 25 MG/ML IJ SOLN: 6.2500 mg | INTRAMUSCULAR | Status: DC | PRN

## 2017-05-22 MED ORDER — ATROPINE SULFATE 1 % OP SOLN
OPHTHALMIC | Status: AC
  Filled 2017-05-22: qty 5

## 2017-05-22 MED ORDER — DORZOLAMIDE HCL 2 % OP SOLN
1.0000 [drp] | Freq: Three times a day (TID) | OPHTHALMIC | Status: DC
  Filled 2017-05-22: qty 10

## 2017-05-22 MED ORDER — PHENYLEPHRINE 40 MCG/ML (10ML) SYRINGE FOR IV PUSH (FOR BLOOD PRESSURE SUPPORT)
PREFILLED_SYRINGE | INTRAVENOUS | Status: DC | PRN
  Administered 2017-05-22: 80 ug via INTRAVENOUS

## 2017-05-22 MED ORDER — EPINEPHRINE PF 1 MG/ML IJ SOLN
INTRAMUSCULAR | Status: AC
  Filled 2017-05-22: qty 1

## 2017-05-22 MED ORDER — GATIFLOXACIN 0.5 % OP SOLN: 1.0000 [drp] | OPHTHALMIC | Status: DC | PRN

## 2017-05-22 MED ORDER — PREDNISOLONE ACETATE 1 % OP SUSP
1.0000 [drp] | Freq: Four times a day (QID) | OPHTHALMIC | Status: DC
  Filled 2017-05-22: qty 5

## 2017-05-22 MED ORDER — TOBRAMYCIN-DEXAMETHASONE 0.3-0.1 % OP OINT
TOPICAL_OINTMENT | OPHTHALMIC | Status: AC
  Filled 2017-05-22: qty 3.5

## 2017-05-22 MED ORDER — POLYMYXIN B SULFATE 500000 UNITS IJ SOLR
INTRAMUSCULAR | Status: AC
  Filled 2017-05-22: qty 500000

## 2017-05-22 MED ORDER — TAMSULOSIN HCL 0.4 MG PO CAPS
0.4000 mg | ORAL_CAPSULE | ORAL | Status: DC
  Administered 2017-05-22: 0.4 mg via ORAL
  Filled 2017-05-22: qty 1

## 2017-05-22 MED ORDER — ROCURONIUM BROMIDE 10 MG/ML (PF) SYRINGE
PREFILLED_SYRINGE | INTRAVENOUS | Status: AC
  Filled 2017-05-22: qty 15

## 2017-05-22 MED ORDER — MAGNESIUM HYDROXIDE 400 MG/5ML PO SUSP: 15.0000 mL | Freq: Four times a day (QID) | ORAL | Status: DC | PRN

## 2017-05-22 MED ORDER — SUGAMMADEX SODIUM 500 MG/5ML IV SOLN
INTRAVENOUS | Status: AC
  Filled 2017-05-22: qty 5

## 2017-05-22 MED ORDER — FUROSEMIDE 20 MG PO TABS: 20.0000 mg | ORAL_TABLET | Freq: Every day | ORAL | Status: DC | PRN

## 2017-05-22 MED ORDER — TROPICAMIDE 1 % OP SOLN
1.0000 [drp] | OPHTHALMIC | Status: AC | PRN
  Administered 2017-05-22 (×3): 1 [drp] via OPHTHALMIC
  Filled 2017-05-22: qty 15

## 2017-05-22 MED ORDER — CEFTAZIDIME 1 G IJ SOLR
INTRAMUSCULAR | Status: AC
  Filled 2017-05-22: qty 1

## 2017-05-22 MED ORDER — GLYCOPYRROLATE 0.2 MG/ML IJ SOLN
INTRAMUSCULAR | Status: DC | PRN
  Administered 2017-05-22: 0.2 mg via INTRAVENOUS

## 2017-05-22 MED ORDER — SODIUM CHLORIDE 0.9 % IV SOLN
INTRAVENOUS | Status: DC
  Administered 2017-05-22 (×2): via INTRAVENOUS

## 2017-05-22 MED ORDER — ONDANSETRON HCL 4 MG/2ML IJ SOLN
INTRAMUSCULAR | Status: DC | PRN
  Administered 2017-05-22: 4 mg via INTRAVENOUS

## 2017-05-22 MED ORDER — MORPHINE SULFATE (PF) 4 MG/ML IV SOLN: 1.0000 mg | INTRAVENOUS | Status: DC | PRN

## 2017-05-22 MED ORDER — SIMVASTATIN 40 MG PO TABS
40.0000 mg | ORAL_TABLET | Freq: Every day | ORAL | Status: DC
  Administered 2017-05-22: 40 mg via ORAL
  Filled 2017-05-22: qty 1

## 2017-05-22 MED ORDER — SODIUM CHLORIDE 0.45 % IV SOLN
INTRAVENOUS | Status: DC
  Administered 2017-05-22: 23:00:00 via INTRAVENOUS

## 2017-05-22 MED ORDER — FINASTERIDE 5 MG PO TABS
5.0000 mg | ORAL_TABLET | Freq: Every day | ORAL | Status: DC
  Administered 2017-05-22: 5 mg via ORAL
  Filled 2017-05-22: qty 1

## 2017-05-22 MED ORDER — ACETAMINOPHEN 325 MG PO TABS: 325.0000 mg | ORAL_TABLET | ORAL | Status: DC | PRN

## 2017-05-22 MED ORDER — GATIFLOXACIN 0.5 % OP SOLN
1.0000 [drp] | Freq: Four times a day (QID) | OPHTHALMIC | Status: DC
  Filled 2017-05-22: qty 2.5

## 2017-05-22 MED ORDER — HYDROCODONE-ACETAMINOPHEN 5-325 MG PO TABS
1.0000 | ORAL_TABLET | ORAL | Status: DC | PRN
  Administered 2017-05-22 – 2017-05-23 (×2): 2 via ORAL
  Filled 2017-05-22 (×2): qty 2

## 2017-05-22 MED ORDER — BRIMONIDINE TARTRATE 0.2 % OP SOLN
1.0000 [drp] | Freq: Two times a day (BID) | OPHTHALMIC | Status: DC
  Filled 2017-05-22: qty 5

## 2017-05-22 MED ORDER — PHENYLEPHRINE HCL 2.5 % OP SOLN: 1.0000 [drp] | OPHTHALMIC | Status: DC | PRN

## 2017-05-22 MED ORDER — ROCURONIUM BROMIDE 10 MG/ML (PF) SYRINGE
PREFILLED_SYRINGE | INTRAVENOUS | Status: DC | PRN
  Administered 2017-05-22: 50 mg via INTRAVENOUS
  Administered 2017-05-22: 20 mg via INTRAVENOUS

## 2017-05-22 MED ORDER — BUPIVACAINE HCL (PF) 0.75 % IJ SOLN
INTRAMUSCULAR | Status: DC | PRN
  Administered 2017-05-22: 10 mL

## 2017-05-22 MED ORDER — ARTIFICIAL TEARS OPHTHALMIC OINT
TOPICAL_OINTMENT | OPHTHALMIC | Status: DC | PRN
  Administered 2017-05-22: 1 via OPHTHALMIC

## 2017-05-22 MED ORDER — LATANOPROST 0.005 % OP SOLN
1.0000 [drp] | Freq: Every day | OPHTHALMIC | Status: DC
  Filled 2017-05-22: qty 2.5

## 2017-05-22 MED ORDER — TOBRAMYCIN 0.3 % OP OINT
TOPICAL_OINTMENT | OPHTHALMIC | Status: DC | PRN
  Administered 2017-05-22: 1 via OPHTHALMIC

## 2017-05-22 MED ORDER — ATROPINE SULFATE 1 % OP SOLN
OPHTHALMIC | Status: DC | PRN
  Administered 2017-05-22: 1 [drp] via OPHTHALMIC

## 2017-05-22 MED ORDER — TEMAZEPAM 15 MG PO CAPS: 15.0000 mg | ORAL_CAPSULE | Freq: Every evening | ORAL | Status: DC | PRN

## 2017-05-22 MED ORDER — DEXAMETHASONE SODIUM PHOSPHATE 10 MG/ML IJ SOLN
INTRAMUSCULAR | Status: DC | PRN
  Administered 2017-05-22: 8 mg via INTRAVENOUS

## 2017-05-22 MED ORDER — SUGAMMADEX SODIUM 500 MG/5ML IV SOLN
INTRAVENOUS | Status: DC | PRN
  Administered 2017-05-22: 500 mg via INTRAVENOUS

## 2017-05-22 MED ORDER — CYCLOPENTOLATE HCL 1 % OP SOLN
1.0000 [drp] | OPHTHALMIC | Status: AC | PRN
  Administered 2017-05-22 (×3): 1 [drp] via OPHTHALMIC
  Filled 2017-05-22: qty 2

## 2017-05-22 MED ORDER — TRIAMCINOLONE ACETONIDE 40 MG/ML IJ SUSP
INTRAMUSCULAR | Status: AC
  Filled 2017-05-22: qty 5

## 2017-05-22 MED ORDER — PHENYLEPHRINE HCL 2.5 % OP SOLN
1.0000 [drp] | OPHTHALMIC | Status: AC | PRN
  Administered 2017-05-22 (×3): 1 [drp] via OPHTHALMIC
  Filled 2017-05-22: qty 2

## 2017-05-22 MED ORDER — DOCUSATE SODIUM 100 MG PO CAPS: 100.0000 mg | ORAL_CAPSULE | Freq: Every day | ORAL | Status: DC

## 2017-05-22 MED ORDER — LISINOPRIL 10 MG PO TABS: 10.0000 mg | ORAL_TABLET | Freq: Every day | ORAL | Status: DC

## 2017-05-22 MED ORDER — LIDOCAINE HCL 2 % IJ SOLN
INTRAMUSCULAR | Status: AC
  Filled 2017-05-22: qty 20

## 2017-05-22 MED ORDER — NABUMETONE 750 MG PO TABS
750.0000 mg | ORAL_TABLET | Freq: Every day | ORAL | Status: DC
  Filled 2017-05-22: qty 1

## 2017-05-22 MED ORDER — STERILE WATER FOR IRRIGATION IR SOLN
Status: DC | PRN
  Administered 2017-05-22: 1000 mL

## 2017-05-22 MED ORDER — FENTANYL CITRATE (PF) 100 MCG/2ML IJ SOLN: 25.0000 ug | INTRAMUSCULAR | Status: DC | PRN

## 2017-05-22 MED ORDER — SODIUM CHLORIDE 0.9 % IJ SOLN
INTRAMUSCULAR | Status: AC
  Filled 2017-05-22: qty 10

## 2017-05-22 MED ORDER — LIDOCAINE 2% (20 MG/ML) 5 ML SYRINGE
INTRAMUSCULAR | Status: AC
  Filled 2017-05-22: qty 20

## 2017-05-22 MED ORDER — DEXAMETHASONE SODIUM PHOSPHATE 10 MG/ML IJ SOLN
INTRAMUSCULAR | Status: DC | PRN
  Administered 2017-05-22: 10 mg

## 2017-05-22 MED ORDER — KETOCONAZOLE 2 % EX CREA
1.0000 "application " | TOPICAL_CREAM | Freq: Every day | CUTANEOUS | Status: DC | PRN
  Filled 2017-05-22: qty 15

## 2017-05-22 MED ORDER — ACETAZOLAMIDE SODIUM 500 MG IJ SOLR
INTRAMUSCULAR | Status: AC
  Filled 2017-05-22: qty 500

## 2017-05-22 MED ORDER — MIDAZOLAM HCL 2 MG/2ML IJ SOLN
INTRAMUSCULAR | Status: AC
  Filled 2017-05-22: qty 2

## 2017-05-22 MED ORDER — ALPRAZOLAM 0.5 MG PO TABS
0.5000 mg | ORAL_TABLET | Freq: Every day | ORAL | Status: DC
  Administered 2017-05-22: 0.5 mg via ORAL
  Filled 2017-05-22: qty 1

## 2017-05-22 MED ORDER — 0.9 % SODIUM CHLORIDE (POUR BTL) OPTIME
TOPICAL | Status: DC | PRN
  Administered 2017-05-22: 1000 mL

## 2017-05-22 MED ORDER — BUPIVACAINE HCL (PF) 0.75 % IJ SOLN
INTRAMUSCULAR | Status: AC
  Filled 2017-05-22: qty 20

## 2017-05-22 MED ORDER — BSS PLUS IO SOLN
INTRAOCULAR | Status: DC | PRN
  Administered 2017-05-22 (×2): 1 via INTRAOCULAR

## 2017-05-22 MED ORDER — PHENYLEPHRINE 40 MCG/ML (10ML) SYRINGE FOR IV PUSH (FOR BLOOD PRESSURE SUPPORT)
PREFILLED_SYRINGE | INTRAVENOUS | Status: AC
  Filled 2017-05-22: qty 30

## 2017-05-22 MED ORDER — BRIMONIDINE TARTRATE 0.15 % OP SOLN
1.0000 [drp] | OPHTHALMIC | Status: AC
  Administered 2017-05-22: 1 [drp] via OPHTHALMIC
  Filled 2017-05-22: qty 5

## 2017-05-22 MED ORDER — PROPOFOL 1000 MG/100ML IV EMUL
INTRAVENOUS | Status: AC
  Filled 2017-05-22: qty 100

## 2017-05-22 MED ORDER — FENTANYL CITRATE (PF) 250 MCG/5ML IJ SOLN
INTRAMUSCULAR | Status: AC
  Filled 2017-05-22: qty 5

## 2017-05-22 MED ORDER — PROPOFOL 10 MG/ML IV BOLUS
INTRAVENOUS | Status: DC | PRN
  Administered 2017-05-22: 200 mg via INTRAVENOUS

## 2017-05-22 MED ORDER — BACITRACIN-POLYMYXIN B 500-10000 UNIT/GM OP OINT
1.0000 "application " | TOPICAL_OINTMENT | Freq: Three times a day (TID) | OPHTHALMIC | Status: DC
  Filled 2017-05-22: qty 3.5

## 2017-05-22 MED ORDER — BACITRACIN-POLYMYXIN B 500-10000 UNIT/GM OP OINT
TOPICAL_OINTMENT | OPHTHALMIC | Status: DC | PRN
  Administered 2017-05-22: 1 via OPHTHALMIC

## 2017-05-22 MED ORDER — LIDOCAINE 2% (20 MG/ML) 5 ML SYRINGE
INTRAMUSCULAR | Status: DC | PRN
  Administered 2017-05-22: 60 mg via INTRAVENOUS

## 2017-05-22 MED ORDER — VITAMIN E 180 MG (400 UNIT) PO CAPS
400.0000 [IU] | ORAL_CAPSULE | Freq: Every day | ORAL | Status: DC
  Filled 2017-05-22: qty 1

## 2017-05-22 MED ORDER — SODIUM HYALURONATE 10 MG/ML IO SOLN
INTRAOCULAR | Status: AC
  Filled 2017-05-22: qty 0.85

## 2017-05-22 MED ORDER — CYCLOPENTOLATE HCL 1 % OP SOLN: 1.0000 [drp] | OPHTHALMIC | Status: DC | PRN

## 2017-05-22 MED ORDER — CEFAZOLIN SODIUM-DEXTROSE 2-4 GM/100ML-% IV SOLN
2.0000 g | INTRAVENOUS | Status: AC
  Administered 2017-05-22: 2 g via INTRAVENOUS
  Filled 2017-05-22: qty 100

## 2017-05-22 MED ORDER — POLYVINYL ALCOHOL 1.4 % OP SOLN
1.0000 [drp] | Freq: Three times a day (TID) | OPHTHALMIC | Status: DC | PRN
  Filled 2017-05-22: qty 15

## 2017-05-22 MED ORDER — BACITRACIN-POLYMYXIN B 500-10000 UNIT/GM OP OINT
TOPICAL_OINTMENT | OPHTHALMIC | Status: AC
  Filled 2017-05-22: qty 3.5

## 2017-05-22 MED ORDER — FENTANYL CITRATE (PF) 100 MCG/2ML IJ SOLN
INTRAMUSCULAR | Status: DC | PRN
Start: 2017-05-22 — End: 2017-05-22
  Administered 2017-05-22: 50 ug via INTRAVENOUS
  Administered 2017-05-22: 100 ug via INTRAVENOUS

## 2017-05-22 SURGICAL SUPPLY — 84 items
APL SRG 3 HI ABS STRL LF PLS (MISCELLANEOUS) ×10
APPLICATOR DR MATTHEWS STRL (MISCELLANEOUS) ×28 IMPLANT
BALL CTTN LRG ABS STRL LF (GAUZE/BANDAGES/DRESSINGS) ×3
BAND CIRCLING RETINAL 2.5 (Ophthalmic Related) IMPLANT
BAND RTNL 125X2.5X.6 CRC (Ophthalmic Related) ×1 IMPLANT
BANDAGE EYE OVAL (MISCELLANEOUS) IMPLANT
BLADE EYE CATARACT 19 1.4 BEAV (BLADE) ×2 IMPLANT
BLADE MVR KNIFE 19G (BLADE) IMPLANT
CANNULA ANT CHAM MAIN (OPHTHALMIC RELATED) IMPLANT
CANNULA DUAL BORE 23G (CANNULA) IMPLANT
CANNULA VLV SOFT TIP 25G (OPHTHALMIC) IMPLANT
CANNULA VLV SOFT TIP 25GA (OPHTHALMIC) IMPLANT
CORDS BIPOLAR (ELECTRODE) IMPLANT
COTTONBALL LRG STERILE PKG (GAUZE/BANDAGES/DRESSINGS) ×9 IMPLANT
COVER SURGICAL LIGHT HANDLE (MISCELLANEOUS) ×3 IMPLANT
DRAPE OPHTHALMIC 77X100 STRL (CUSTOM PROCEDURE TRAY) ×3 IMPLANT
ERASER HMR WETFIELD 23G BP (MISCELLANEOUS) IMPLANT
FILTER BLUE MILLIPORE (MISCELLANEOUS) ×6 IMPLANT
FILTER STRAW FLUID ASPIR (MISCELLANEOUS) IMPLANT
FORCEPS GRIESHABER ILM 25G A (INSTRUMENTS) IMPLANT
GAS OPHTHALMIC (MISCELLANEOUS) IMPLANT
GLOVE SS BIOGEL STRL SZ 6.5 (GLOVE) ×1 IMPLANT
GLOVE SS BIOGEL STRL SZ 7 (GLOVE) ×1 IMPLANT
GLOVE SUPERSENSE BIOGEL SZ 6.5 (GLOVE) ×2
GLOVE SUPERSENSE BIOGEL SZ 7 (GLOVE) ×2
GLOVE SURG 8.5 LATEX PF (GLOVE) ×3 IMPLANT
GOWN STRL REUS W/ TWL LRG LVL3 (GOWN DISPOSABLE) ×2 IMPLANT
GOWN STRL REUS W/TWL LRG LVL3 (GOWN DISPOSABLE) ×6
HANDLE PNEUMATIC FOR CONSTEL (OPHTHALMIC) IMPLANT
IMPL SILICONE (Ophthalmic Related) ×1 IMPLANT
IMPLANT SILICONE (Ophthalmic Related) ×6 IMPLANT
KIT BASIN OR (CUSTOM PROCEDURE TRAY) ×3 IMPLANT
KIT PERFLUORON PROCEDURE 5ML (MISCELLANEOUS) IMPLANT
KIT ROOM TURNOVER OR (KITS) ×3 IMPLANT
KNIFE CRESCENT 1.75 EDGEAHEAD (BLADE) IMPLANT
KNIFE GRIESHABER SHARP 2.5MM (MISCELLANEOUS) ×9 IMPLANT
MICROPICK 25G (MISCELLANEOUS)
NDL 18GX1X1/2 (RX/OR ONLY) (NEEDLE) ×1 IMPLANT
NDL 25GX 5/8IN NON SAFETY (NEEDLE) IMPLANT
NDL FILTER BLUNT 18X1 1/2 (NEEDLE) ×1 IMPLANT
NDL HYPO 30X.5 LL (NEEDLE) ×2 IMPLANT
NDL PRECISIONGLIDE 27X1.5 (NEEDLE) IMPLANT
NEEDLE 18GX1X1/2 (RX/OR ONLY) (NEEDLE) ×3 IMPLANT
NEEDLE 25GX 5/8IN NON SAFETY (NEEDLE) IMPLANT
NEEDLE FILTER BLUNT 18X 1/2SAF (NEEDLE) ×2
NEEDLE FILTER BLUNT 18X1 1/2 (NEEDLE) ×1 IMPLANT
NEEDLE HYPO 30X.5 LL (NEEDLE) ×6 IMPLANT
NEEDLE PRECISIONGLIDE 27X1.5 (NEEDLE) IMPLANT
NS IRRIG 1000ML POUR BTL (IV SOLUTION) ×3 IMPLANT
PACK VITRECTOMY CUSTOM (CUSTOM PROCEDURE TRAY) ×3 IMPLANT
PAD ARMBOARD 7.5X6 YLW CONV (MISCELLANEOUS) ×6 IMPLANT
PAK VITRECTOMY PIK 25 GA (OPHTHALMIC RELATED) IMPLANT
PIC ILLUMINATED 25G (OPHTHALMIC)
PICK MICROPICK 25G (MISCELLANEOUS) IMPLANT
PIK ILLUMINATED 25G (OPHTHALMIC) IMPLANT
PROBE LASER ILLUM FLEX CVD 25G (OPHTHALMIC) IMPLANT
REPL STRA BRUSH NDL (NEEDLE) IMPLANT
REPL STRA BRUSH NEEDLE (NEEDLE) IMPLANT
RESERVOIR BACK FLUSH (MISCELLANEOUS) IMPLANT
ROLLS DENTAL (MISCELLANEOUS) ×6 IMPLANT
SET FLUID INJECTOR (SET/KITS/TRAYS/PACK) IMPLANT
SLEEVE SCLERAL TYPE 270 (Ophthalmic Related) ×2 IMPLANT
SPEAR EYE SURG WECK-CEL (MISCELLANEOUS) ×9 IMPLANT
SPONGE GROOVED SILICONE 4X12X8 (Ophthalmic Related) ×2 IMPLANT
SPONGE SURGIFOAM ABS GEL 12-7 (HEMOSTASIS) IMPLANT
STOPCOCK 4 WAY LG BORE MALE ST (IV SETS) IMPLANT
SUT CHROMIC 7 0 TG140 8 (SUTURE) ×3 IMPLANT
SUT ETHILON 9 0 TG140 8 (SUTURE) IMPLANT
SUT MERSILENE 4 0 RV 2 (SUTURE) ×8 IMPLANT
SUT MERSILENE 4 0 S 24 (SUTURE) ×4 IMPLANT
SUT SILK 2 0 (SUTURE) ×3
SUT SILK 2-0 18XBRD TIE 12 (SUTURE) ×1 IMPLANT
SUT SILK 4 0 RB 1 (SUTURE) ×3 IMPLANT
SUT VICRYL 7 0 TG140 8 (SUTURE) IMPLANT
SYR 10ML LL (SYRINGE) ×3 IMPLANT
SYR 20CC LL (SYRINGE) ×3 IMPLANT
SYR 5ML LL (SYRINGE) ×3 IMPLANT
SYR BULB 3OZ (MISCELLANEOUS) ×3 IMPLANT
SYR TB 1ML LUER SLIP (SYRINGE) IMPLANT
TIRE RTNL 2.5XGRV CNCV 9X (Ophthalmic Related) IMPLANT
TOWEL OR 17X24 6PK STRL BLUE (TOWEL DISPOSABLE) ×9 IMPLANT
TUBING HIGH PRESS EXTEN 6IN (TUBING) IMPLANT
WATER STERILE IRR 1000ML POUR (IV SOLUTION) ×3 IMPLANT
WIPE INSTRUMENT VISIWIPE 73X73 (MISCELLANEOUS) ×3 IMPLANT

## 2017-05-22 NOTE — Anesthesia Preprocedure Evaluation (Signed)
Anesthesia Evaluation  Patient identified by MRN, date of birth, ID band Patient awake    Reviewed: Allergy & Precautions, H&P , NPO status , Patient's Chart, lab work & pertinent test results  Airway Mallampati: II  TM Distance: >3 FB Neck ROM: Full    Dental no notable dental hx. (+) Teeth Intact   Pulmonary neg pulmonary ROS, shortness of breath, sleep apnea ,    Pulmonary exam normal breath sounds clear to auscultation       Cardiovascular hypertension, + CAD  Normal cardiovascular exam Rhythm:Regular Rate:Normal     Neuro/Psych negative neurological ROS  negative psych ROS   GI/Hepatic negative GI ROS, Neg liver ROS,   Endo/Other  negative endocrine ROS  Renal/GU negative Renal ROS  negative genitourinary   Musculoskeletal negative musculoskeletal ROS (+)   Abdominal   Peds negative pediatric ROS (+)  Hematology negative hematology ROS (+)   Anesthesia Other Findings   Reproductive/Obstetrics negative OB ROS                             Anesthesia Physical  Anesthesia Plan  ASA: III  Anesthesia Plan: General   Post-op Pain Management:    Induction: Intravenous  PONV Risk Score and Plan: 2 and Ondansetron, Treatment may vary due to age or medical condition and Dexamethasone  Airway Management Planned: Oral ETT and LMA  Additional Equipment:   Intra-op Plan:   Post-operative Plan: Extubation in OR  Informed Consent: I have reviewed the patients History and Physical, chart, labs and discussed the procedure including the risks, benefits and alternatives for the proposed anesthesia with the patient or authorized representative who has indicated his/her understanding and acceptance.   Dental advisory given  Plan Discussed with: CRNA  Anesthesia Plan Comments: (  )        Anesthesia Quick Evaluation

## 2017-05-22 NOTE — Anesthesia Postprocedure Evaluation (Signed)
Anesthesia Post Note  Patient: Paul Lam  Procedure(s) Performed: SCLERAL BUCKLE, LASER, GAS INJECTION LEFT EYE (Left )     Patient location during evaluation: PACU Anesthesia Type: General Level of consciousness: sedated Pain management: pain level controlled Vital Signs Assessment: post-procedure vital signs reviewed and stable Respiratory status: spontaneous breathing and respiratory function stable Cardiovascular status: stable Postop Assessment: no apparent nausea or vomiting Anesthetic complications: no    Last Vitals:  Vitals:   05/22/17 2030 05/22/17 2044  BP: (!) 155/63 (!) 146/63  Pulse: 73 68  Resp: 11 16  Temp:    SpO2: 98% 99%    Last Pain:  Vitals:   05/22/17 2045  TempSrc:   PainSc: 0-No pain                 Sentoria Brent DANIEL

## 2017-05-22 NOTE — Transfer of Care (Signed)
Immediate Anesthesia Transfer of Care Note  Patient: Paul Lam  Procedure(s) Performed: SCLERAL BUCKLE, LASER, GAS INJECTION LEFT EYE (Left )  Patient Location: PACU  Anesthesia Type:General  Level of Consciousness: awake and alert   Airway & Oxygen Therapy: Patient connected to nasal cannula oxygen  Post-op Assessment: Report given to RN, Post -op Vital signs reviewed and stable and Patient moving all extremities X 4  Post vital signs: Reviewed and stable  Last Vitals:  Vitals:   05/22/17 1131 05/22/17 2014  BP: (!) 147/58   Pulse: 65   Resp: 18   Temp: 36.5 C 36.9 C  SpO2: 99%     Last Pain:  Vitals:   05/22/17 2014  TempSrc:   PainSc: 0-No pain         Complications: No apparent anesthesia complications

## 2017-05-22 NOTE — H&P (Signed)
Paul Lam is an 76 y.o. male.   Chief Complaint:loss of half of the vision left eye HPI: lost nasal half of vision left eye over three days  Past Medical History:  Diagnosis Date  . Anginal pain ([place])   . Anxiety   . Arthritis    generalized arthritis   . CAD (coronary artery disease)   . Cancer ([place])    skin cancer removed left ear    . Detached retina   . History of echocardiogram 02/19/2009   EF 50-55%; LA mild-mod dilated;   . History of kidney stones   . History of nuclear stress test 02/19/2009   low risk, normal   . Hyperlipidemia   . Hypertension   . Obesity (BMI 30-39.9)   . OSA (obstructive sleep apnea)    severe with AHI 25/hr now on BiPAP  . Shortness of breath    related to weight   . Sleep apnea   . Vertigo     Past Surgical History:  Procedure Laterality Date  . APPENDECTOMY    . CARDIAC CATHETERIZATION  07/13/2005   noncritical CAD  . EYE SURGERY     detached retina on right eye 11/12   . FLEXIBLE SIGMOIDOSCOPY N/A 07/14/2015   Procedure: FLEXIBLE SIGMOIDOSCOPY;  Surgeon: Aviva Signs, MD;  [facility];  Service: Gastroenterology;  Laterality: N/A;  . NASAL SINUS SURGERY    . OTHER SURGICAL HISTORY     left ear skin ca removed  2 weeks ago - not completeely healed 08/15/11   . TONSILLECTOMY    . TOTAL KNEE ARTHROPLASTY  08/22/2011   Procedure: TOTAL KNEE ARTHROPLASTY;  Surgeon: Gearlean Alf, MD;  Location[facility];  Service: Orthopedics;  Laterality: Right;    Family History  Problem Relation Age of Onset  . Heart failure Mother        also arthritis  . Stroke Father        also emphysema  . Heart failure Father    Social History:  reports that  has never smoked. he has never used smokeless tobacco. He reports that he does not drink alcohol or use drugs.  Allergies: No Known Allergies  Medications Prior to Admission  Medication Sig Dispense Refill  . ALPRAZolam (XANAX) 0.5 MG tablet Take 0.5 mg at bedtime by mouth. Anxiety      . Apoaequorin (PREVAGEN EXTRA STRENGTH PO) Take 1 tablet daily by mouth.    . diphenhydrAMINE (BENADRYL) 25 mg capsule Take 25 mg at bedtime by mouth.    . docusate sodium (COLACE) 100 MG capsule Take 100 mg by mouth 3 (three) times daily.     Marland Kitchen escitalopram (LEXAPRO) 10 MG tablet Take 10 mg daily by mouth.    . finasteride (PROSCAR) 5 MG tablet Take 5 mg daily with supper by mouth.   10  . furosemide (LASIX) 20 MG tablet Take 20 mg daily as needed by mouth (for fluid retention.).     Marland Kitchen ketoconazole (NIZORAL) 2 % cream Apply 1 application daily as needed topically. For itchy skin.  1  . lisinopril (PRINIVIL,ZESTRIL) 10 MG tablet Take 10 mg by mouth daily.     . Methylcellulose, Laxative, (CITRUCEL PO) Take 1 tablet daily by mouth.     . Misc Natural Products (TART CHERRY ADVANCED PO) Take 1,200 mg daily by mouth.    . nabumetone (RELAFEN) 750 MG tablet Take 750 mg by mouth daily.     . NON FORMULARY at bedtime.  VPAP    . Omega-3 Fatty Acids (OMEGA-3 FISH OIL PO) Take 660 mg by mouth daily.    . Polyvinyl Alcohol-Povidone PF (REFRESH) 1.4-0.6 % SOLN Place 1-2 drops 3 (three) times daily as needed into both ears (for dry eyes.).    Marland Kitchen potassium chloride (K-DUR) 10 MEQ tablet Take 10 mEq daily as needed by mouth (with the lasix for fluid retention.). Take with Lasix  0  . simvastatin (ZOCOR) 40 MG tablet Take 40 mg by mouth at bedtime.    . Tamsulosin HCl (FLOMAX) 0.4 MG CAPS Take 0.4 mg 2 (two) times daily by mouth. Take 1 capsule (0.4 mg) by mouth daily with supper (~1830) & take 1 capsule (0.4 mg) by mouth at bedtime.    . vitamin E 400 UNIT capsule Take 400 Units by mouth daily.      Review of systems otherwise negative  Blood pressure (!) 147/58, pulse 65, temperature 97.7 F (36.5 C), temperature source Oral, resp. rate 18, height 5\' 8"  (1.727 m), weight 250 lb (113.4 kg), SpO2 99 %.  Physical exam: Mental status: oriented x3. Eyes: See eye exam associated with this date of surgery in  media tab.  Scanned in by scanning center Ears, Nose, Throat: within normal limits Neck: Within Normal limits General: within normal limits Chest: Within normal limits Breast: deferred Heart: Within normal limits Abdomen: Within normal limits GU: deferred Extremities: within normal limits Skin: within normal limits  Assessment/Plan Rhegmatogenous Retinal detachment left eye Plan: To [facility] for Scleral buckle, laser, gas injection left eye.  Possible pars plana vitrectomy left eye  Paul Lam 05/22/2017, 11:36 AM

## 2017-05-22 NOTE — Anesthesia Procedure Notes (Signed)
Procedure Name: Intubation Date/Time: 05/22/2017 5:12 PM Performed by: Orlie Dakin, CRNA Pre-anesthesia Checklist: Patient identified, Emergency Drugs available, Suction available, Patient being monitored and Timeout performed Patient Re-evaluated:Patient Re-evaluated prior to induction Oxygen Delivery Method: Circle system utilized Preoxygenation: Pre-oxygenation with 100% oxygen Induction Type: IV induction Ventilation: Mask ventilation without difficulty and Oral airway inserted - appropriate to patient size Laryngoscope Size: Sabra Heck and 3 Grade View: Grade II Tube type: Oral Tube size: 7.5 mm Number of attempts: 1 Airway Equipment and Method: Stylet Placement Confirmation: ETT inserted through vocal cords under direct vision,  positive ETCO2 and breath sounds checked- equal and bilateral Secured at: 25 cm Tube secured with: Tape Dental Injury: Teeth and Oropharynx as per pre-operative assessment  Comments: Noted pre-op numerous upper front crowns, none loose per patient.  Noted teeth as pre-op after DL.  4x4s bite block used.

## 2017-05-22 NOTE — Brief Op Note (Signed)
Brief Operative note   Preoperative diagnosis:  Rhegmatogenous retinal detachment left eye Postoperative diagnosis  Rhegmatogenous retinal detachment left eye  Procedures: scleral buckle, laser, gas injection left eye  Surgeon:  [name], MD...  Assistant:  Coralyn Helling  Anesthesia: General  Specimen: none  Estimated blood loss:  1cc  Complications: none  Patient sent to PACU in good condition  Composed by [name] MD  Dictation number: [id number]

## 2017-05-22 NOTE — H&P (Signed)
I examined the patient today and there is no change in the medical status 

## 2017-05-23 ENCOUNTER — Encounter (HOSPITAL_COMMUNITY): Payer: Self-pay | Admitting: Ophthalmology

## 2017-05-23 DIAGNOSIS — E669 Obesity, unspecified: Secondary | ICD-10-CM | POA: Diagnosis not present

## 2017-05-23 DIAGNOSIS — E785 Hyperlipidemia, unspecified: Secondary | ICD-10-CM | POA: Diagnosis not present

## 2017-05-23 DIAGNOSIS — I2510 Atherosclerotic heart disease of native coronary artery without angina pectoris: Secondary | ICD-10-CM | POA: Diagnosis not present

## 2017-05-23 DIAGNOSIS — I10 Essential (primary) hypertension: Secondary | ICD-10-CM | POA: Diagnosis not present

## 2017-05-23 DIAGNOSIS — H33002 Unspecified retinal detachment with retinal break, left eye: Secondary | ICD-10-CM | POA: Diagnosis not present

## 2017-05-23 DIAGNOSIS — F419 Anxiety disorder, unspecified: Secondary | ICD-10-CM | POA: Diagnosis not present

## 2017-05-23 MED ORDER — PREDNISOLONE ACETATE 1 % OP SUSP: 1.0000 [drp] | Freq: Four times a day (QID) | OPHTHALMIC | 0 refills | Status: DC

## 2017-05-23 MED ORDER — GATIFLOXACIN 0.5 % OP SOLN: 1.0000 [drp] | Freq: Four times a day (QID) | OPHTHALMIC | Status: DC

## 2017-05-23 MED ORDER — BACITRACIN-POLYMYXIN B 500-10000 UNIT/GM OP OINT: 1.0000 "application " | TOPICAL_OINTMENT | Freq: Three times a day (TID) | OPHTHALMIC | 0 refills | Status: DC

## 2017-05-23 MED ORDER — BRIMONIDINE TARTRATE 0.2 % OP SOLN: 1.0000 [drp] | Freq: Two times a day (BID) | OPHTHALMIC | 12 refills | Status: DC

## 2017-05-23 NOTE — Discharge Summary (Signed)
Discharge summary not needed on [facility] patients per medical records. 

## 2017-05-23 NOTE — Progress Notes (Signed)
05/23/2017, 6:32 AM  Mental Status:  Awake, Alert, Oriented  Anterior segment: Cornea  Clear    Anterior Chamber Clear    Lens:    IOL  Intra Ocular Pressure 19 mmHg with Tonopen  Vitreous: Clear 30%gas bubble   Retina:  Attached Good laser reaction   Impression: Excellent result Retina attached  Final Diagnosis: Principal Problem:   Rhegmatogenous retinal detachment of left eye   Plan: start post operative eye drops.  Discharge to home.  Give post operative instructions  Paul Lam 05/23/2017, 6:32 AM

## 2017-05-23 NOTE — Progress Notes (Signed)
Discharge HOme. Home discharge instruction given, no question verbalized.

## 2017-05-24 NOTE — Op Note (Signed)
NAME:  Paul Lam, Paul Lam                     ACCOUNT NO.:  MEDICAL RECORD NO.:  [id number]  LOCATION:                                 FACILITY:  PHYSICIAN:  John D. Zigmund Daniel, M.D.      DATE OF BIRTH:  DATE OF PROCEDURE:  05/22/2017 DATE OF DISCHARGE:                              OPERATIVE REPORT   ADMISSION DIAGNOSIS:  Rhegmatogenous retinal detachment, left eye.  PROCEDURES:  Scleral buckle, left eye; retinal photocoagulation, left eye; gas-fluid exchange, left eye.  SURGEON:  Chrystie Nose. Zigmund Daniel, M.D.  ASSISTANTLeslye Peer Teschner.  ANESTHESIA:  General.  DETAILS:  Usual prep and drape, 360-degree limbal peritomy, isolation of 4 rectus muscles on 2-0 silk, scleral dissection for 360 degrees to admit a #279 intrascleral implant with a 240 band placed around the eye. The joint was placed at 9 o'clock.  A radial 508 G segment was placed beneath the scleral buckle at 12 o'clock to support the retinal break. Diathermy was placed in the bed.  Two sutures per quadrant were placed in the scleral flaps.  These were preplaced sutures.  The scleral buckle was placed around the globe.  A 279 implant with 1 mm trim from the posterior edge.  Perforation site was chosen at 2 o'clock in the posterior aspect of the bed.  After scleral cutdown and diathermy of the choroid, the subretinal space was entered with a sharp needle.  A large amount of clear colorless subretinal fluid came forth in a controlled manner.  As the fluid egressed, the scleral flaps were closed.  The buckle was trimmed and adjusted.  The band was trimmed and adjusted. The 508 G radial segment was placed beneath the buckle at 12 o'clock to support the break.  Scleral flaps were closed.  Indirect ophthalmoscopy showed the retina to be lying nicely on the scleral buckle with minimal subretinal fluid remaining.  The indirect ophthalmoscope laser was moved into place.  732 burns were placed around the break at 12 o'clock and on the  scleral buckle for 360 degrees.  The power was between 500 and 1400 mW, 1000 microns each and 0.2 seconds each.  The band was adjusted and trimmed.  The scleral sutures were drawn securely, knotted, and the free ends removed.  Paracenteses x2 at 12 o'clock were obtained, a closing pressure of 10 with a Barraquer tonometer.  The conjunctiva was reposited with 7-0 chromic suture.  Polymyxin and ceftazidime were rinsed around the globe for antibiotic coverage.  Decadron 10 mg was injected into the lower subconjunctival space.  Atropine solution was applied.  Marcaine was injected around the globe for postop pain. Closing pressure was 10 with a Barraquer tonometer.  Polysporin ophthalmic ointment and a patch and shield were placed.  The patient was awakened and taken to recovery in satisfactory condition.  COMPLICATIONS:  None.  DURATION:  Three hours.     Chrystie Nose. Zigmund Daniel, M.D.     JDM/MEDQ  D:  05/22/2017  T:  05/23/2017  Job:  [id number]

## 2017-05-26 ENCOUNTER — Encounter (INDEPENDENT_AMBULATORY_CARE_PROVIDER_SITE_OTHER): Payer: Medicare Other | Admitting: Ophthalmology

## 2017-05-26 DIAGNOSIS — H338 Other retinal detachments: Secondary | ICD-10-CM

## 2017-06-01 DIAGNOSIS — I2510 Atherosclerotic heart disease of native coronary artery without angina pectoris: Secondary | ICD-10-CM | POA: Diagnosis not present

## 2017-06-01 DIAGNOSIS — I10 Essential (primary) hypertension: Secondary | ICD-10-CM | POA: Diagnosis not present

## 2017-06-01 DIAGNOSIS — N400 Enlarged prostate without lower urinary tract symptoms: Secondary | ICD-10-CM | POA: Diagnosis not present

## 2017-06-01 DIAGNOSIS — Z6839 Body mass index (BMI) 39.0-39.9, adult: Secondary | ICD-10-CM | POA: Diagnosis not present

## 2017-06-01 DIAGNOSIS — E782 Mixed hyperlipidemia: Secondary | ICD-10-CM | POA: Diagnosis not present

## 2017-06-01 DIAGNOSIS — G4730 Sleep apnea, unspecified: Secondary | ICD-10-CM | POA: Diagnosis not present

## 2017-06-01 DIAGNOSIS — F329 Major depressive disorder, single episode, unspecified: Secondary | ICD-10-CM | POA: Diagnosis not present

## 2017-06-01 DIAGNOSIS — F419 Anxiety disorder, unspecified: Secondary | ICD-10-CM | POA: Diagnosis not present

## 2017-06-01 DIAGNOSIS — G8929 Other chronic pain: Secondary | ICD-10-CM | POA: Diagnosis not present

## 2017-06-01 DIAGNOSIS — Z1389 Encounter for screening for other disorder: Secondary | ICD-10-CM | POA: Diagnosis not present

## 2017-06-08 ENCOUNTER — Encounter (HOSPITAL_COMMUNITY): Payer: Self-pay | Admitting: Ophthalmology

## 2017-06-16 ENCOUNTER — Encounter (INDEPENDENT_AMBULATORY_CARE_PROVIDER_SITE_OTHER): Payer: Medicare Other | Admitting: Ophthalmology

## 2017-06-16 DIAGNOSIS — H338 Other retinal detachments: Secondary | ICD-10-CM

## 2017-06-16 NOTE — H&P (Signed)
Paul Lam is an 77 y.o. male.   Chief Complaint: loss of vision left eye 5 days ago HPI: Had scleral buckle OS for rhegmatogenous retinal detachment.  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    Past Medical History:  Diagnosis Date  . Anginal pain ([place])   . Anxiety   . Arthritis    generalized arthritis   . CAD (coronary artery disease)   . Cancer ([place])    skin cancer removed left ear    . Depression   . Detached retina   . Headache   . History of echocardiogram 02/19/2009   EF 50-55%; LA mild-mod dilated;   . History of kidney stones   . History of nuclear stress test 02/19/2009   low risk, normal   . Hyperlipidemia   . Hypertension   . Obesity (BMI 30-39.9)   . OSA (obstructive sleep apnea)    severe with AHI 25/hr now on BiPAP  . Shortness of breath    related to weight   . Sleep apnea   . Vertigo     Past Surgical History:  Procedure Laterality Date  . APPENDECTOMY    . CARDIAC CATHETERIZATION  07/13/2005   noncritical CAD  . EYE SURGERY     detached retina on right eye 11/12   . FLEXIBLE SIGMOIDOSCOPY N/A 07/14/2015   Procedure: FLEXIBLE SIGMOIDOSCOPY;  Surgeon: Aviva Signs, MD;  [facility];  Service: Gastroenterology;  Laterality: N/A;  . NASAL SINUS SURGERY    . OTHER SURGICAL HISTORY     left ear skin ca removed  2 weeks ago - not completeely healed 08/15/11   . SCLERAL BUCKLE WITH POSSIBLE 25 GAUGE PARS PLANA VITRECTOMY Left 05/22/2017   Procedure: SCLERAL BUCKLE, LASER, GAS INJECTION LEFT EYE;  Surgeon: [name], MD;  Location: [facility];  Service: Ophthalmology;  Laterality: Left;  . TONSILLECTOMY    . TOTAL KNEE ARTHROPLASTY  08/22/2011   Procedure: TOTAL KNEE ARTHROPLASTY;  Surgeon: Gearlean Alf, MD;  Location: [facility];  Service: Orthopedics;  Laterality: Right;    Family History  Problem Relation Age of Onset  . Heart failure Mother        also arthritis  . Stroke Father        also emphysema  . Heart  failure Father    Social History:  reports that  has never smoked. he has never used smokeless tobacco. He reports that he does not drink alcohol or use drugs.  Allergies: No Known Allergies  No medications prior to admission.    Review of systems otherwise negative  There were no vitals taken for this visit.  Physical exam: Mental status: oriented x3. Eyes: See eye exam associated with this date of surgery in media tab.  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  Plan: To [facility] for Pars plana vitrectomy, membrane peel, laser, perfluoron injection, perfluoron removal, perfluoropropane gas injection lefe eye  [name] 06/16/2017, 3:19 PM

## 2017-06-19 ENCOUNTER — Encounter (HOSPITAL_BASED_OUTPATIENT_CLINIC_OR_DEPARTMENT_OTHER): Payer: Self-pay

## 2017-06-19 ENCOUNTER — Ambulatory Visit (HOSPITAL_BASED_OUTPATIENT_CLINIC_OR_DEPARTMENT_OTHER): Admit: 2017-06-19 | Payer: Medicare Other | Admitting: Otolaryngology

## 2017-06-19 ENCOUNTER — Encounter (HOSPITAL_COMMUNITY): Payer: Self-pay | Admitting: *Deleted

## 2017-06-19 ENCOUNTER — Other Ambulatory Visit: Payer: Self-pay

## 2017-06-19 SURGERY — SEPTOPLASTY, NOSE, WITH NASAL TURBINATE REDUCTION
Anesthesia: General

## 2017-06-19 NOTE — Progress Notes (Signed)
SDW-Pre-op call completed by pt spouse [name] [facility]) in presence of pt. Spouse denies that pt C/O any acute cardiopulmonary issues. Spouse a stated that pt is under the care of Dr. Debara Pickett Cardiology. Pt denies having a chest x ray within the last year. Spouse denies recent labs. Spouse stated that MD advised that pt stop taking Aspirin. Spouse made aware to have pt stop taking vitamins, fish oil and herbal medications. Do not take any NSAIDs ie: Ibuprofen, Advil, Naproxen (Aleve), Motrin, BC and Goody Powder or any medication containing Aspirin.  CPAP machine reserved for pt ( spouse to bring pt mask and hose). Spouse verbalized understanding of all pre-op instructions.

## 2017-06-20 ENCOUNTER — Ambulatory Visit (HOSPITAL_COMMUNITY): Payer: Medicare Other | Admitting: Anesthesiology

## 2017-06-20 ENCOUNTER — Encounter (HOSPITAL_COMMUNITY)
Admission: RE | Disposition: A | Discharge status: Home / Self Care | Payer: Self-pay | Source: Ambulatory Visit | Attending: Ophthalmology

## 2017-06-20 ENCOUNTER — Ambulatory Visit (HOSPITAL_COMMUNITY)
Admission: RE | Admit: 2017-06-20 | Discharge: 2017-06-21 | Disposition: A | Discharge status: Home / Self Care | Payer: Medicare Other | Source: Ambulatory Visit | Attending: Ophthalmology | Admitting: Ophthalmology

## 2017-06-20 ENCOUNTER — Encounter (HOSPITAL_COMMUNITY): Payer: Self-pay | Admitting: *Deleted

## 2017-06-20 DIAGNOSIS — E669 Obesity, unspecified: Secondary | ICD-10-CM | POA: Insufficient documentation

## 2017-06-20 DIAGNOSIS — F419 Anxiety disorder, unspecified: Secondary | ICD-10-CM | POA: Diagnosis not present

## 2017-06-20 DIAGNOSIS — Z85828 Personal history of other malignant neoplasm of skin: Secondary | ICD-10-CM | POA: Diagnosis not present

## 2017-06-20 DIAGNOSIS — F329 Major depressive disorder, single episode, unspecified: Secondary | ICD-10-CM | POA: Diagnosis not present

## 2017-06-20 DIAGNOSIS — Z6837 Body mass index (BMI) 37.0-37.9, adult: Secondary | ICD-10-CM | POA: Diagnosis not present

## 2017-06-20 DIAGNOSIS — I2510 Atherosclerotic heart disease of native coronary artery without angina pectoris: Secondary | ICD-10-CM | POA: Insufficient documentation

## 2017-06-20 DIAGNOSIS — H33002 Unspecified retinal detachment with retinal break, left eye: Secondary | ICD-10-CM | POA: Diagnosis not present

## 2017-06-20 DIAGNOSIS — I10 Essential (primary) hypertension: Secondary | ICD-10-CM | POA: Diagnosis not present

## 2017-06-20 DIAGNOSIS — E785 Hyperlipidemia, unspecified: Secondary | ICD-10-CM | POA: Diagnosis not present

## 2017-06-20 DIAGNOSIS — Z79899 Other long term (current) drug therapy: Secondary | ICD-10-CM | POA: Insufficient documentation

## 2017-06-20 DIAGNOSIS — H338 Other retinal detachments: Secondary | ICD-10-CM | POA: Diagnosis not present

## 2017-06-20 DIAGNOSIS — G4733 Obstructive sleep apnea (adult) (pediatric): Secondary | ICD-10-CM | POA: Diagnosis not present

## 2017-06-20 DIAGNOSIS — Z87442 Personal history of urinary calculi: Secondary | ICD-10-CM | POA: Diagnosis not present

## 2017-06-20 DIAGNOSIS — H3342 Traction detachment of retina, left eye: Secondary | ICD-10-CM | POA: Diagnosis not present

## 2017-06-20 DIAGNOSIS — F418 Other specified anxiety disorders: Secondary | ICD-10-CM | POA: Diagnosis not present

## 2017-06-20 HISTORY — DX: Other specified postprocedural states: Z98.890

## 2017-06-20 HISTORY — DX: Adverse effect of unspecified anesthetic, initial encounter: T41.45XA

## 2017-06-20 HISTORY — DX: Other specified postprocedural states: R11.2

## 2017-06-20 HISTORY — PX: PARS PLANA VITRECTOMY: SHX2166

## 2017-06-20 HISTORY — DX: Other complications of anesthesia, initial encounter: T88.59XA

## 2017-06-20 HISTORY — DX: Serous retinal detachment, unspecified eye: H33.20

## 2017-06-20 HISTORY — PX: REPAIR OF COMPLEX TRACTION RETINAL DETACHMENT: SHX6217

## 2017-06-20 LAB — CBC
HEMATOCRIT: 40.7 % (ref 39.0–52.0)
HEMOGLOBIN: 12.9 g/dL — AB (ref 13.0–17.0)
MCH: 28.5 pg (ref 26.0–34.0)
MCHC: 31.7 g/dL (ref 30.0–36.0)
MCV: 90 fL (ref 78.0–100.0)
Platelets: 121 10*3/uL — ABNORMAL LOW (ref 150–400)
RBC: 4.52 MIL/uL (ref 4.22–5.81)
RDW: 13.7 % (ref 11.5–15.5)
WBC: 6 10*3/uL (ref 4.0–10.5)

## 2017-06-20 LAB — BASIC METABOLIC PANEL
ANION GAP: 8 (ref 5–15)
BUN: 14 mg/dL (ref 6–20)
CALCIUM: 8.6 mg/dL — AB (ref 8.9–10.3)
CHLORIDE: 104 mmol/L (ref 101–111)
CO2: 27 mmol/L (ref 22–32)
Creatinine, Ser: 0.83 mg/dL (ref 0.61–1.24)
GFR calc non Af Amer: >60 mL/min (ref >60)
Glucose, Bld: 90 mg/dL (ref 65–99)
Potassium: 4.2 mmol/L (ref 3.5–5.1)
Sodium: 139 mmol/L (ref 135–145)

## 2017-06-20 SURGERY — PARS PLANA VITRECTOMY WITH 25 GAUGE
Anesthesia: General | Site: Eye | Laterality: Left

## 2017-06-20 MED ORDER — PHENYLEPHRINE HCL 2.5 % OP SOLN
1.0000 [drp] | OPHTHALMIC | Status: AC | PRN
  Administered 2017-06-20 (×3): 1 [drp] via OPHTHALMIC
  Filled 2017-06-20: qty 2

## 2017-06-20 MED ORDER — TAMSULOSIN HCL 0.4 MG PO CAPS
0.4000 mg | ORAL_CAPSULE | Freq: Two times a day (BID) | ORAL | Status: DC
  Administered 2017-06-20 (×2): 0.4 mg via ORAL
  Filled 2017-06-20 (×2): qty 1

## 2017-06-20 MED ORDER — DORZOLAMIDE HCL 2 % OP SOLN
1.0000 [drp] | Freq: Three times a day (TID) | OPHTHALMIC | Status: DC
  Filled 2017-06-20: qty 10

## 2017-06-20 MED ORDER — CEFTAZIDIME 1 G IJ SOLR
INTRAMUSCULAR | Status: AC
  Filled 2017-06-20: qty 1

## 2017-06-20 MED ORDER — BSS PLUS IO SOLN
INTRAOCULAR | Status: AC
  Filled 2017-06-20: qty 500

## 2017-06-20 MED ORDER — BSS PLUS IO SOLN
INTRAOCULAR | Status: DC | PRN
  Administered 2017-06-20: 1 via INTRAOCULAR

## 2017-06-20 MED ORDER — DEXAMETHASONE SODIUM PHOSPHATE 10 MG/ML IJ SOLN
INTRAMUSCULAR | Status: AC
  Filled 2017-06-20: qty 1

## 2017-06-20 MED ORDER — SODIUM HYALURONATE 10 MG/ML IO SOLN
INTRAOCULAR | Status: AC
  Filled 2017-06-20: qty 0.85

## 2017-06-20 MED ORDER — SODIUM CHLORIDE 0.9 % IV SOLN
Freq: Once | INTRAVENOUS | Status: AC
  Administered 2017-06-20: 11:00:00 via INTRAVENOUS

## 2017-06-20 MED ORDER — BRIMONIDINE TARTRATE 0.2 % OP SOLN
1.0000 [drp] | Freq: Two times a day (BID) | OPHTHALMIC | Status: DC
  Filled 2017-06-20: qty 5

## 2017-06-20 MED ORDER — TRIAMCINOLONE ACETONIDE 40 MG/ML IJ SUSP
INTRAMUSCULAR | Status: AC
  Filled 2017-06-20: qty 5

## 2017-06-20 MED ORDER — FUROSEMIDE 20 MG PO TABS: 20.0000 mg | ORAL_TABLET | Freq: Every day | ORAL | Status: DC | PRN

## 2017-06-20 MED ORDER — EPINEPHRINE PF 1 MG/ML IJ SOLN
INTRAMUSCULAR | Status: AC
  Filled 2017-06-20: qty 1

## 2017-06-20 MED ORDER — ROCURONIUM BROMIDE 10 MG/ML (PF) SYRINGE
PREFILLED_SYRINGE | INTRAVENOUS | Status: AC
  Filled 2017-06-20: qty 5

## 2017-06-20 MED ORDER — EPINEPHRINE PF 1 MG/ML IJ SOLN
INTRAMUSCULAR | Status: DC | PRN
  Administered 2017-06-20: .3 mL

## 2017-06-20 MED ORDER — HYPROMELLOSE (GONIOSCOPIC) 2.5 % OP SOLN
OPHTHALMIC | Status: AC
  Filled 2017-06-20: qty 15

## 2017-06-20 MED ORDER — DIPHENHYDRAMINE HCL (SLEEP) 25 MG PO TABS: 25.0000 mg | ORAL_TABLET | Freq: Every day | ORAL | Status: DC

## 2017-06-20 MED ORDER — KETOCONAZOLE 2 % EX CREA
1.0000 "application " | TOPICAL_CREAM | Freq: Every day | CUTANEOUS | Status: DC | PRN
  Filled 2017-06-20: qty 15

## 2017-06-20 MED ORDER — ONDANSETRON HCL 4 MG/2ML IJ SOLN
INTRAMUSCULAR | Status: AC
  Filled 2017-06-20: qty 2

## 2017-06-20 MED ORDER — GATIFLOXACIN 0.5 % OP SOLN
1.0000 [drp] | Freq: Four times a day (QID) | OPHTHALMIC | Status: DC
  Filled 2017-06-20: qty 2.5

## 2017-06-20 MED ORDER — ALPRAZOLAM 0.5 MG PO TABS
0.5000 mg | ORAL_TABLET | Freq: Every day | ORAL | Status: DC
  Administered 2017-06-20: 0.5 mg via ORAL
  Filled 2017-06-20: qty 1

## 2017-06-20 MED ORDER — BSS IO SOLN
INTRAOCULAR | Status: AC
  Filled 2017-06-20: qty 15

## 2017-06-20 MED ORDER — PROPOFOL 10 MG/ML IV BOLUS
INTRAVENOUS | Status: AC
  Filled 2017-06-20: qty 20

## 2017-06-20 MED ORDER — BUPIVACAINE HCL (PF) 0.75 % IJ SOLN
INTRAMUSCULAR | Status: AC
  Filled 2017-06-20: qty 10

## 2017-06-20 MED ORDER — PREDNISOLONE ACETATE 1 % OP SUSP: 1.0000 [drp] | Freq: Four times a day (QID) | OPHTHALMIC | Status: DC

## 2017-06-20 MED ORDER — ESCITALOPRAM OXALATE 10 MG PO TABS
5.0000 mg | ORAL_TABLET | Freq: Every day | ORAL | Status: DC
  Administered 2017-06-20: 5 mg via ORAL
  Filled 2017-06-20: qty 1

## 2017-06-20 MED ORDER — PREDNISOLONE ACETATE 1 % OP SUSP
1.0000 [drp] | Freq: Four times a day (QID) | OPHTHALMIC | Status: DC
  Filled 2017-06-20: qty 5

## 2017-06-20 MED ORDER — TEMAZEPAM 15 MG PO CAPS: 15.0000 mg | ORAL_CAPSULE | Freq: Every evening | ORAL | Status: DC | PRN

## 2017-06-20 MED ORDER — GATIFLOXACIN 0.5 % OP SOLN
1.0000 [drp] | OPHTHALMIC | Status: AC | PRN
  Administered 2017-06-20 (×3): 1 [drp] via OPHTHALMIC
  Filled 2017-06-20: qty 2.5

## 2017-06-20 MED ORDER — STERILE WATER FOR INJECTION IJ SOLN
INTRAMUSCULAR | Status: DC | PRN
  Administered 2017-06-20: 14:00:00

## 2017-06-20 MED ORDER — SODIUM CHLORIDE 0.9 % IJ SOLN
INTRAMUSCULAR | Status: AC
  Filled 2017-06-20: qty 10

## 2017-06-20 MED ORDER — DEXTROSE 5 % IV SOLN
INTRAVENOUS | Status: DC | PRN
  Administered 2017-06-20: 20 ug/min via INTRAVENOUS

## 2017-06-20 MED ORDER — NABUMETONE 750 MG PO TABS
750.0000 mg | ORAL_TABLET | Freq: Every day | ORAL | Status: DC
  Filled 2017-06-20: qty 1

## 2017-06-20 MED ORDER — CEFAZOLIN SODIUM-DEXTROSE 2-4 GM/100ML-% IV SOLN
2.0000 g | INTRAVENOUS | Status: AC
  Administered 2017-06-20: 2 g via INTRAVENOUS
  Filled 2017-06-20: qty 100

## 2017-06-20 MED ORDER — ONDANSETRON HCL 4 MG/2ML IJ SOLN
4.0000 mg | Freq: Four times a day (QID) | INTRAMUSCULAR | Status: DC
  Administered 2017-06-20 (×2): 4 mg via INTRAVENOUS
  Filled 2017-06-20 (×2): qty 2

## 2017-06-20 MED ORDER — ONDANSETRON HCL 4 MG/2ML IJ SOLN
INTRAMUSCULAR | Status: DC | PRN
  Administered 2017-06-20: 4 mg via INTRAVENOUS

## 2017-06-20 MED ORDER — EPHEDRINE SULFATE-NACL 50-0.9 MG/10ML-% IV SOSY
PREFILLED_SYRINGE | INTRAVENOUS | Status: DC | PRN
  Administered 2017-06-20: 5 mg via INTRAVENOUS
  Administered 2017-06-20: 10 mg via INTRAVENOUS

## 2017-06-20 MED ORDER — ACETAZOLAMIDE SODIUM 500 MG IJ SOLR
INTRAMUSCULAR | Status: AC
  Filled 2017-06-20: qty 500

## 2017-06-20 MED ORDER — BRIMONIDINE TARTRATE 0.15 % OP SOLN
1.0000 [drp] | Freq: Two times a day (BID) | OPHTHALMIC | Status: DC
  Administered 2017-06-20: 1 [drp] via OPHTHALMIC
  Filled 2017-06-20: qty 5

## 2017-06-20 MED ORDER — SUGAMMADEX SODIUM 200 MG/2ML IV SOLN
INTRAVENOUS | Status: DC | PRN
  Administered 2017-06-20: 200 mg via INTRAVENOUS

## 2017-06-20 MED ORDER — 0.9 % SODIUM CHLORIDE (POUR BTL) OPTIME
TOPICAL | Status: DC | PRN
  Administered 2017-06-20: 1000 mL

## 2017-06-20 MED ORDER — TROPICAMIDE 1 % OP SOLN
1.0000 [drp] | OPHTHALMIC | Status: AC | PRN
  Administered 2017-06-20 (×3): 1 [drp] via OPHTHALMIC
  Filled 2017-06-20: qty 15

## 2017-06-20 MED ORDER — MORPHINE SULFATE (PF) 4 MG/ML IV SOLN: 1.0000 mg | INTRAVENOUS | Status: DC | PRN

## 2017-06-20 MED ORDER — LIDOCAINE 2% (20 MG/ML) 5 ML SYRINGE
INTRAMUSCULAR | Status: DC | PRN
  Administered 2017-06-20: 60 mg via INTRAVENOUS

## 2017-06-20 MED ORDER — CYCLOPENTOLATE HCL 1 % OP SOLN
1.0000 [drp] | OPHTHALMIC | Status: AC | PRN
  Administered 2017-06-20 (×3): 1 [drp] via OPHTHALMIC
  Filled 2017-06-20: qty 2

## 2017-06-20 MED ORDER — LIDOCAINE 2% (20 MG/ML) 5 ML SYRINGE
INTRAMUSCULAR | Status: AC
  Filled 2017-06-20: qty 5

## 2017-06-20 MED ORDER — PROPOFOL 10 MG/ML IV BOLUS
INTRAVENOUS | Status: DC | PRN
  Administered 2017-06-20: 150 mg via INTRAVENOUS

## 2017-06-20 MED ORDER — ROCURONIUM BROMIDE 10 MG/ML (PF) SYRINGE
PREFILLED_SYRINGE | INTRAVENOUS | Status: DC | PRN
  Administered 2017-06-20: 50 mg via INTRAVENOUS

## 2017-06-20 MED ORDER — POLYVINYL ALCOHOL-POVIDONE PF 1.4-0.6 % OP SOLN: 1.0000 [drp] | Freq: Three times a day (TID) | OPHTHALMIC | Status: DC | PRN

## 2017-06-20 MED ORDER — POLYMYXIN B SULFATE 500000 UNITS IJ SOLR
INTRAMUSCULAR | Status: AC
  Filled 2017-06-20: qty 500000

## 2017-06-20 MED ORDER — STERILE WATER FOR INJECTION IJ SOLN
INTRAMUSCULAR | Status: AC
  Filled 2017-06-20: qty 10

## 2017-06-20 MED ORDER — BSS IO SOLN
INTRAOCULAR | Status: DC | PRN
  Administered 2017-06-20: 15 mL via INTRAOCULAR

## 2017-06-20 MED ORDER — SIMVASTATIN 40 MG PO TABS
40.0000 mg | ORAL_TABLET | Freq: Every day | ORAL | Status: DC
  Administered 2017-06-20: 40 mg via ORAL
  Filled 2017-06-20: qty 1

## 2017-06-20 MED ORDER — SODIUM CHLORIDE 0.9 % IV SOLN
INTRAVENOUS | Status: DC | PRN
  Administered 2017-06-20 (×2): via INTRAVENOUS

## 2017-06-20 MED ORDER — LACTATED RINGERS IV SOLN: INTRAVENOUS | Status: DC | PRN

## 2017-06-20 MED ORDER — BACITRACIN-POLYMYXIN B 500-10000 UNIT/GM OP OINT
1.0000 "application " | TOPICAL_OINTMENT | Freq: Three times a day (TID) | OPHTHALMIC | Status: DC
  Filled 2017-06-20: qty 3.5

## 2017-06-20 MED ORDER — DIPHENHYDRAMINE HCL 25 MG PO CAPS: 25.0000 mg | ORAL_CAPSULE | Freq: Every evening | ORAL | Status: DC | PRN

## 2017-06-20 MED ORDER — FENTANYL CITRATE (PF) 100 MCG/2ML IJ SOLN: 25.0000 ug | INTRAMUSCULAR | Status: DC | PRN

## 2017-06-20 MED ORDER — SUGAMMADEX SODIUM 200 MG/2ML IV SOLN
INTRAVENOUS | Status: AC
  Filled 2017-06-20: qty 2

## 2017-06-20 MED ORDER — DEXAMETHASONE SODIUM PHOSPHATE 10 MG/ML IJ SOLN
INTRAMUSCULAR | Status: DC | PRN
  Administered 2017-06-20: 10 mg via INTRAVENOUS

## 2017-06-20 MED ORDER — ACETAZOLAMIDE SODIUM 500 MG IJ SOLR
500.0000 mg | Freq: Once | INTRAMUSCULAR | Status: AC
  Administered 2017-06-21: 500 mg via INTRAVENOUS
  Filled 2017-06-20: qty 500

## 2017-06-20 MED ORDER — VITAMIN E 180 MG (400 UNIT) PO CAPS
400.0000 [IU] | ORAL_CAPSULE | Freq: Every day | ORAL | Status: DC
  Administered 2017-06-20: 400 [IU] via ORAL
  Filled 2017-06-20 (×2): qty 1

## 2017-06-20 MED ORDER — POTASSIUM CHLORIDE ER 10 MEQ PO TBCR
10.0000 meq | EXTENDED_RELEASE_TABLET | Freq: Every day | ORAL | Status: DC | PRN
  Filled 2017-06-20: qty 1

## 2017-06-20 MED ORDER — EPHEDRINE 5 MG/ML INJ
INTRAVENOUS | Status: AC
  Filled 2017-06-20: qty 10

## 2017-06-20 MED ORDER — ACETAMINOPHEN 325 MG PO TABS: 325.0000 mg | ORAL_TABLET | ORAL | Status: DC | PRN

## 2017-06-20 MED ORDER — ATROPINE SULFATE 1 % OP OINT
TOPICAL_OINTMENT | OPHTHALMIC | Status: DC | PRN
  Administered 2017-06-20: 1 via OPHTHALMIC

## 2017-06-20 MED ORDER — FENTANYL CITRATE (PF) 250 MCG/5ML IJ SOLN
INTRAMUSCULAR | Status: AC
  Filled 2017-06-20: qty 5

## 2017-06-20 MED ORDER — FENTANYL CITRATE (PF) 100 MCG/2ML IJ SOLN
INTRAMUSCULAR | Status: DC | PRN
  Administered 2017-06-20: 50 ug via INTRAVENOUS
  Administered 2017-06-20: 100 ug via INTRAVENOUS
  Administered 2017-06-20: 50 ug via INTRAVENOUS

## 2017-06-20 MED ORDER — HYDROCODONE-ACETAMINOPHEN 5-325 MG PO TABS
1.0000 | ORAL_TABLET | ORAL | Status: DC | PRN
  Administered 2017-06-20 – 2017-06-21 (×2): 2 via ORAL
  Filled 2017-06-20 (×2): qty 2

## 2017-06-20 MED ORDER — LISINOPRIL 10 MG PO TABS
10.0000 mg | ORAL_TABLET | Freq: Every day | ORAL | Status: DC
  Administered 2017-06-20: 10 mg via ORAL
  Filled 2017-06-20: qty 1

## 2017-06-20 MED ORDER — SODIUM CHLORIDE 0.45 % IV SOLN
INTRAVENOUS | Status: DC
  Administered 2017-06-20: 17:00:00 via INTRAVENOUS

## 2017-06-20 MED ORDER — TETRACAINE HCL 0.5 % OP SOLN
2.0000 [drp] | Freq: Once | OPHTHALMIC | Status: DC
  Filled 2017-06-20: qty 4

## 2017-06-20 MED ORDER — BACITRACIN-POLYMYXIN B 500-10000 UNIT/GM OP OINT
TOPICAL_OINTMENT | OPHTHALMIC | Status: DC | PRN
  Administered 2017-06-20: 1 via OPHTHALMIC

## 2017-06-20 MED ORDER — LIDOCAINE HCL 2 % IJ SOLN
INTRAMUSCULAR | Status: AC
  Filled 2017-06-20: qty 20

## 2017-06-20 MED ORDER — LIDOCAINE HCL 2 % IJ SOLN
INTRAMUSCULAR | Status: DC | PRN
  Administered 2017-06-20: 31 mL via RETROBULBAR

## 2017-06-20 MED ORDER — TOBRAMYCIN-DEXAMETHASONE 0.3-0.1 % OP OINT
TOPICAL_OINTMENT | OPHTHALMIC | Status: AC
  Filled 2017-06-20: qty 3.5

## 2017-06-20 MED ORDER — MAGNESIUM HYDROXIDE 400 MG/5ML PO SUSP: 15.0000 mL | Freq: Four times a day (QID) | ORAL | Status: DC | PRN

## 2017-06-20 MED ORDER — ATROPINE SULFATE 1 % OP SOLN
OPHTHALMIC | Status: AC
  Filled 2017-06-20: qty 5

## 2017-06-20 MED ORDER — HYALURONIDASE HUMAN 150 UNIT/ML IJ SOLN
INTRAMUSCULAR | Status: AC
  Filled 2017-06-20: qty 1

## 2017-06-20 MED ORDER — FINASTERIDE 5 MG PO TABS
5.0000 mg | ORAL_TABLET | Freq: Every day | ORAL | Status: DC
  Administered 2017-06-20: 5 mg via ORAL
  Filled 2017-06-20 (×2): qty 1

## 2017-06-20 MED ORDER — BACITRACIN-POLYMYXIN B 500-10000 UNIT/GM OP OINT
TOPICAL_OINTMENT | OPHTHALMIC | Status: AC
  Filled 2017-06-20: qty 3.5

## 2017-06-20 MED ORDER — LATANOPROST 0.005 % OP SOLN
1.0000 [drp] | Freq: Every day | OPHTHALMIC | Status: DC
  Filled 2017-06-20: qty 2.5

## 2017-06-20 SURGICAL SUPPLY — 76 items
APL SRG 3 HI ABS STRL LF PLS (MISCELLANEOUS)
APPLICATOR DR MATTHEWS STRL (MISCELLANEOUS) IMPLANT
BALL CTTN LRG ABS STRL LF (GAUZE/BANDAGES/DRESSINGS) ×3
BLADE EYE CATARACT 19 1.4 BEAV (BLADE) IMPLANT
BLADE MVR KNIFE 19G (BLADE) IMPLANT
BLADE MVR KNIFE 20G (BLADE) IMPLANT
CANNULA DUAL BORE 23G (CANNULA) IMPLANT
CANNULA DUALBORE 25G (CANNULA) ×2 IMPLANT
CANNULA INFUSION 6 (MISCELLANEOUS) ×1 IMPLANT
CANNULA INFUSION 6MM (MISCELLANEOUS) ×1
CANNULA VLV SOFT TIP 25G (OPHTHALMIC) ×1 IMPLANT
CANNULA VLV SOFT TIP 25GA (OPHTHALMIC) ×3 IMPLANT
CORDS BIPOLAR (ELECTRODE) IMPLANT
COTTONBALL LRG STERILE PKG (GAUZE/BANDAGES/DRESSINGS) ×9 IMPLANT
COVER MAYO STAND STRL (DRAPES) IMPLANT
DRAPE INCISE 51X51 W/FILM STRL (DRAPES) IMPLANT
DRAPE OPHTHALMIC 77X100 STRL (CUSTOM PROCEDURE TRAY) ×3 IMPLANT
FILTER BLUE MILLIPORE (MISCELLANEOUS) IMPLANT
FILTER STRAW FLUID ASPIR (MISCELLANEOUS) IMPLANT
FORCEPS ECKARDT ILM 25G SERR (OPHTHALMIC RELATED) IMPLANT
FORCEPS GRIESHABER ILM 25G A (INSTRUMENTS) IMPLANT
GAS AUTO FILL CONSTEL (OPHTHALMIC) ×3
GAS AUTO FILL CONSTELLATION (OPHTHALMIC) IMPLANT
GLOVE SS BIOGEL STRL SZ 6.5 (GLOVE) ×1 IMPLANT
GLOVE SS BIOGEL STRL SZ 7 (GLOVE) ×1 IMPLANT
GLOVE SUPERSENSE BIOGEL SZ 6.5 (GLOVE) ×2
GLOVE SUPERSENSE BIOGEL SZ 7 (GLOVE) ×2
GLOVE SURG 8.5 LATEX PF (GLOVE) ×3 IMPLANT
GOWN STRL REUS W/ TWL LRG LVL3 (GOWN DISPOSABLE) ×3 IMPLANT
GOWN STRL REUS W/TWL LRG LVL3 (GOWN DISPOSABLE) ×9
HANDLE PNEUMATIC FOR CONSTEL (OPHTHALMIC) IMPLANT
KIT BASIN OR (CUSTOM PROCEDURE TRAY) ×3 IMPLANT
KIT PERFLUORON PROCEDURE 5ML (MISCELLANEOUS) ×2 IMPLANT
KNIFE CRESCENT 2.5 55 ANG (BLADE) IMPLANT
MICROPICK 25G (MISCELLANEOUS)
NDL 18GX1X1/2 (RX/OR ONLY) (NEEDLE) ×1 IMPLANT
NDL 25GX 5/8IN NON SAFETY (NEEDLE) IMPLANT
NDL FILTER BLUNT 18X1 1/2 (NEEDLE) ×1 IMPLANT
NDL HYPO 30X.5 LL (NEEDLE) IMPLANT
NEEDLE 18GX1X1/2 (RX/OR ONLY) (NEEDLE) ×3 IMPLANT
NEEDLE 25GX 5/8IN NON SAFETY (NEEDLE) IMPLANT
NEEDLE FILTER BLUNT 18X 1/2SAF (NEEDLE) ×2
NEEDLE FILTER BLUNT 18X1 1/2 (NEEDLE) ×1 IMPLANT
NEEDLE HYPO 30X.5 LL (NEEDLE) IMPLANT
NS IRRIG 1000ML POUR BTL (IV SOLUTION) ×3 IMPLANT
PACK VITRECTOMY CUSTOM (CUSTOM PROCEDURE TRAY) ×3 IMPLANT
PAD ARMBOARD 7.5X6 YLW CONV (MISCELLANEOUS) ×6 IMPLANT
PAK PIK VITRECTOMY CVS 25GA (OPHTHALMIC) ×3 IMPLANT
PENCIL BIPOLAR 25GA STR DISP (OPHTHALMIC RELATED) IMPLANT
PIC ILLUMINATED 25G (OPHTHALMIC) ×3
PICK MICROPICK 25G (MISCELLANEOUS) IMPLANT
PIK ILLUMINATED 25G (OPHTHALMIC) ×1 IMPLANT
PROBE LASER ILLUM FLEX CVD 25G (OPHTHALMIC) IMPLANT
REPL STRA BRUSH NDL (NEEDLE) IMPLANT
REPL STRA BRUSH NEEDLE (NEEDLE) IMPLANT
RESERVOIR BACK FLUSH (MISCELLANEOUS) IMPLANT
ROLLS DENTAL (MISCELLANEOUS) ×6 IMPLANT
SCISSORS TIP ADVANCED DSP 25GA (INSTRUMENTS) IMPLANT
SCRAPER DIAMOND 25GA (OPHTHALMIC RELATED) IMPLANT
SCRAPER DIAMOND DUST MEMBRANE (MISCELLANEOUS) IMPLANT
SPONGE SURGIFOAM ABS GEL 12-7 (HEMOSTASIS) ×3 IMPLANT
STOPCOCK 4 WAY LG BORE MALE ST (IV SETS) IMPLANT
SUT CHROMIC 7 0 TG140 8 (SUTURE) IMPLANT
SUT ETHILON 10 0 CS140 6 (SUTURE) IMPLANT
SUT ETHILON 9 0 TG140 8 (SUTURE) IMPLANT
SUT POLY NON ABSORB 10-0 8 STR (SUTURE) IMPLANT
SUT SILK 4 0 RB 1 (SUTURE) IMPLANT
SYR 10ML LL (SYRINGE) IMPLANT
SYR 20CC LL (SYRINGE) ×3 IMPLANT
SYR 5ML LL (SYRINGE) IMPLANT
SYR BULB 3OZ (MISCELLANEOUS) ×3 IMPLANT
SYR TB 1ML LUER SLIP (SYRINGE) ×3 IMPLANT
TOWEL OR 17X24 6PK STRL BLUE (TOWEL DISPOSABLE) ×3 IMPLANT
TUBING HIGH PRESS EXTEN 6IN (TUBING) ×2 IMPLANT
WATER STERILE IRR 1000ML POUR (IV SOLUTION) ×3 IMPLANT
WIPE INSTRUMENT VISIWIPE 73X73 (MISCELLANEOUS) IMPLANT

## 2017-06-20 NOTE — Anesthesia Preprocedure Evaluation (Addendum)
Anesthesia Evaluation  Patient identified by MRN, date of birth, ID band Patient awake    Reviewed: Allergy & Precautions, H&P , NPO status , Patient's Chart, lab work & pertinent test results  Airway Mallampati: III  TM Distance: >3 FB Neck ROM: Full    Dental no notable dental hx. (+) Teeth Intact, Dental Advisory Given   Pulmonary sleep apnea and Continuous Positive Airway Pressure Ventilation ,    Pulmonary exam normal breath sounds clear to auscultation       Cardiovascular hypertension, Pt. on medications + CAD   Rhythm:Regular Rate:Normal     Neuro/Psych  Headaches, Anxiety Depression    GI/Hepatic negative GI ROS, Neg liver ROS,   Endo/Other  Morbid obesity  Renal/GU negative Renal ROS  negative genitourinary   Musculoskeletal  (+) Arthritis , Osteoarthritis,    Abdominal   Peds  Hematology negative hematology ROS (+)   Anesthesia Other Findings   Reproductive/Obstetrics negative OB ROS                            Anesthesia Physical Anesthesia Plan  ASA: III  Anesthesia Plan: General   Post-op Pain Management:    Induction: Intravenous  PONV Risk Score and Plan: 3 and Ondansetron and Dexamethasone  Airway Management Planned: Oral ETT  Additional Equipment:   Intra-op Plan:   Post-operative Plan: Extubation in OR  Informed Consent: I have reviewed the patients History and Physical, chart, labs and discussed the procedure including the risks, benefits and alternatives for the proposed anesthesia with the patient or authorized representative who has indicated his/her understanding and acceptance.   Dental advisory given  Plan Discussed with: CRNA  Anesthesia Plan Comments:         Anesthesia Quick Evaluation

## 2017-06-20 NOTE — Transfer of Care (Signed)
Immediate Anesthesia Transfer of Care Note  Patient: Paul Lam  Procedure(s) Performed: PARS PLANA VITRECTOMY WITH 25 GAUGE, REPAIR OF COMPLEX TRACTION RETINAL DETACHMENT, MEMBRANE PEEEL (Left Eye)  Patient Location: PACU  Anesthesia Type:General  Level of Consciousness: awake, alert  and oriented  Airway & Oxygen Therapy: Patient Spontanous Breathing and Patient connected to nasal cannula oxygen  Post-op Assessment: Report given to RN, Post -op Vital signs reviewed and stable and Patient moving all extremities X 4  Post vital signs: Reviewed and stable  Last Vitals:  Vitals:   06/20/17 0952 06/20/17 1451  BP: (!) 154/64 (!) 164/78  Pulse:  81  Resp:  (!) 7  Temp:  36.7 C  SpO2:  94%    Last Pain:  Vitals:   06/20/17 1451  PainSc: (P) Asleep         Complications: No apparent anesthesia complications

## 2017-06-20 NOTE — Brief Op Note (Signed)
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  Assistant:  Deatra Ina SA    Anesthesia: General  Specimen: none  Estimated blood loss:  1cc  Complications: none  Patient sent to PACU in good condition  Composed by [name] MD  Dictation number: [phone]

## 2017-06-20 NOTE — Anesthesia Procedure Notes (Signed)
Procedure Name: Intubation Date/Time: 06/20/2017 1:36 PM Performed by: Harden Mo, CRNA Pre-anesthesia Checklist: Patient identified, Emergency Drugs available, Suction available and Patient being monitored Patient Re-evaluated:Patient Re-evaluated prior to induction Oxygen Delivery Method: Circle System Utilized Preoxygenation: Pre-oxygenation with 100% oxygen Induction Type: IV induction Ventilation: Mask ventilation without difficulty Laryngoscope Size: Mac and 4 Grade View: Grade III Tube type: Oral Tube size: 7.5 mm Number of attempts: 1 Airway Equipment and Method: Stylet and Oral airway Placement Confirmation: ETT inserted through vocal cords under direct vision,  positive ETCO2 and breath sounds checked- equal and bilateral Secured at: 23 cm Tube secured with: Tape Dental Injury: Teeth and Oropharynx as per pre-operative assessment  Comments: Intubation by Lavona Mound, CRNA

## 2017-06-20 NOTE — H&P (Signed)
I examined the patient today and there is no change in the medical status 

## 2017-06-20 NOTE — Anesthesia Postprocedure Evaluation (Signed)
Anesthesia Post Note  Patient: Paul Lam  Procedure(s) Performed: PARS PLANA VITRECTOMY WITH 25 GAUGE, REPAIR OF COMPLEX TRACTION RETINAL DETACHMENT, MEMBRANE PEEEL (Left Eye)     Patient location during evaluation: PACU Anesthesia Type: General Level of consciousness: awake and alert Pain management: pain level controlled Vital Signs Assessment: post-procedure vital signs reviewed and stable Respiratory status: spontaneous breathing, nonlabored ventilation, respiratory function stable and patient connected to nasal cannula oxygen Cardiovascular status: blood pressure returned to baseline and stable Postop Assessment: no apparent nausea or vomiting Anesthetic complications: no    Last Vitals:  Vitals:   06/20/17 1451 06/20/17 1505  BP: (!) 164/78 (!) 159/78  Pulse: 81 75  Resp: (!) 7 (!) 9  Temp: 36.7 C   SpO2: 94% 99%    Last Pain:  Vitals:   06/20/17 1505  PainSc: Asleep                 Paul Lam,W. EDMOND

## 2017-06-21 ENCOUNTER — Encounter (HOSPITAL_COMMUNITY): Payer: Self-pay | Admitting: Ophthalmology

## 2017-06-21 ENCOUNTER — Other Ambulatory Visit: Payer: Self-pay

## 2017-06-21 DIAGNOSIS — H33002 Unspecified retinal detachment with retinal break, left eye: Secondary | ICD-10-CM | POA: Diagnosis not present

## 2017-06-21 DIAGNOSIS — H3342 Traction detachment of retina, left eye: Secondary | ICD-10-CM | POA: Diagnosis not present

## 2017-06-21 DIAGNOSIS — E785 Hyperlipidemia, unspecified: Secondary | ICD-10-CM | POA: Diagnosis not present

## 2017-06-21 DIAGNOSIS — F419 Anxiety disorder, unspecified: Secondary | ICD-10-CM | POA: Diagnosis not present

## 2017-06-21 DIAGNOSIS — F329 Major depressive disorder, single episode, unspecified: Secondary | ICD-10-CM | POA: Diagnosis not present

## 2017-06-21 DIAGNOSIS — I2510 Atherosclerotic heart disease of native coronary artery without angina pectoris: Secondary | ICD-10-CM | POA: Diagnosis not present

## 2017-06-21 MED ORDER — GATIFLOXACIN 0.5 % OP SOLN: 1.0000 [drp] | Freq: Four times a day (QID) | OPHTHALMIC | Status: DC

## 2017-06-21 MED ORDER — LATANOPROST 0.005 % OP SOLN: 1.0000 [drp] | Freq: Every day | OPHTHALMIC | 12 refills | Status: DC

## 2017-06-21 MED ORDER — PREDNISOLONE ACETATE 1 % OP SUSP: 1.0000 [drp] | Freq: Four times a day (QID) | OPHTHALMIC | 0 refills | Status: DC

## 2017-06-21 MED ORDER — BACITRACIN-POLYMYXIN B 500-10000 UNIT/GM OP OINT: 1.0000 "application " | TOPICAL_OINTMENT | Freq: Three times a day (TID) | OPHTHALMIC | 0 refills | Status: DC

## 2017-06-21 MED ORDER — BRIMONIDINE TARTRATE 0.2 % OP SOLN: 1.0000 [drp] | Freq: Two times a day (BID) | OPHTHALMIC | 12 refills | Status: DC

## 2017-06-21 MED ORDER — DORZOLAMIDE HCL 2 % OP SOLN: 1.0000 [drp] | Freq: Three times a day (TID) | OPHTHALMIC | 12 refills | Status: DC

## 2017-06-21 NOTE — Progress Notes (Signed)
06/21/2017, 6:29 AM  Mental Status:  Awake, Alert, Oriented  Anterior segment: Cornea  Clear    Anterior Chamber Clear    Lens:    IOL,  Intra Ocular Pressure 30 mmHg with Tonopen  Vitreous: Clear 99%gas bubble   Retina:  Attached Good laser reaction   Impression: Excellent result Retina attached   Final Diagnosis: Active Problems:   Rhegmatogenous retinal detachment of left eye   Plan: start post operative eye drops.  Discharge to home.  Give post operative instructions  Paul Lam 06/21/2017, 6:29 AM

## 2017-06-21 NOTE — Progress Notes (Signed)
Discharge home. Home discharge instruction given by previous nurse. No questions verbalized by the patient and the wife.

## 2017-06-21 NOTE — Discharge Summary (Signed)
Discharge summary not needed on [facility] patients per medical records. 

## 2017-06-21 NOTE — Op Note (Signed)
NAME:  Paul Lam, Paul Lam                ACCOUNT NO.:  [id number]  MEDICAL RECORD NO.:  [id number]  LOCATION:  [facility]                         FACILITY:  [facility]  PHYSICIAN:  Chrystie Nose. Zigmund Daniel, M.D. 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  PROCEDURES:  Repair of complex traction retinal detachment of left eye with pars plana vitrectomy, retinal photocoagulation, membrane peel, gas- fluid exchange, and C3F8 injection, left eye.  SURGEON:  Chrystie Nose. Zigmund Daniel, MD.  ASSISTANT:  Deatra Ina, SA.  ANESTHESIA:  General.  DETAILS:  Usual prep and drape, 25-gauge trocars placed at 10, 2, and 4 o'clock, infusion at 4 o'clock.  Provisc placed on the corneal surface the BIOM viewing system was moved into place.  Pars plana vitrectomy was begun just behind the pseudophakos.  Dense vitreous membranes and proliferative vitreoretinopathy were encountered.  This was carefully removed under low suction and rapid cutting.  Posterior membranes were encountered.  These were stripped with the vitreous cutter and forceps. Once the vitreous membranes were stripped, Perfluoron was injected into the vitreous cavity to reattach the posterior retina.  A retinal break was seen at 6 o'clock.  Gas-fluid exchange was carried out in a partial fashion to sandwich the Perfluoron liquid.  This caused a total reattachment of the retina anteriorly and posteriorly.  Additional PFO was injected at this point to force the retina down onto the scleral buckle at 6 o'clock.  The endolaser was positioned in the eye.  1662 burns were placed around the retinal periphery and around the retinal breaks.  The power was 400 mW, 1000 microns and 0.1 seconds each.  The previous original break at 6 o'clock was lasered again.  There was  full reattachment of the retina at this point.  The PFO was carefully removed as room air was injected into the vitreous cavity.  A BSS rinse was used to float any Perfluoron, identify Perfluoron and remove it from the vitreous cavity.  All PFO was removed.  The posterior fluid was removed with the silicone tip suction line as well.  Additional laser was performed, but a total of 1662 burns were placed around the breaks in the periphery.  C3F8 was measured into a 14% concentration.  This was exchanged for intravitreal gas.  Once the retina was fully attached and C3F8 14% was in place, the instruments were removed from the eye and the 25-gauge trocars were removed from the eye.  The wounds were tested and found to be secure.  Polymyxin and ceftazidime were injected around the globe.  Atropine solution was applied.  Marcaine was injected around the globe for postop pain.  Decadron 10 mg was injected into the lower subconjunctival space.  The closing pressure was 10 with a Barraquer tonometer.  COMPLICATIONS:  None.  DURATION:  1 hour 30 minutes.  The patient was awakened and taken to Recovery in satisfactory condition.     Chrystie Nose. Zigmund Daniel, M.D.     JDM/MEDQ  D:  06/20/2017  T:  06/21/2017  Job:  [id number]

## 2017-06-27 ENCOUNTER — Encounter (INDEPENDENT_AMBULATORY_CARE_PROVIDER_SITE_OTHER): Payer: Medicare Other | Admitting: Ophthalmology

## 2017-06-27 DIAGNOSIS — H338 Other retinal detachments: Secondary | ICD-10-CM

## 2017-07-13 DIAGNOSIS — B351 Tinea unguium: Secondary | ICD-10-CM | POA: Diagnosis not present

## 2017-07-13 DIAGNOSIS — L110 Acquired keratosis follicularis: Secondary | ICD-10-CM | POA: Diagnosis not present

## 2017-07-13 DIAGNOSIS — I739 Peripheral vascular disease, unspecified: Secondary | ICD-10-CM | POA: Diagnosis not present

## 2017-07-19 ENCOUNTER — Encounter (INDEPENDENT_AMBULATORY_CARE_PROVIDER_SITE_OTHER): Payer: Medicare Other | Admitting: Ophthalmology

## 2017-07-19 DIAGNOSIS — H338 Other retinal detachments: Secondary | ICD-10-CM

## 2017-08-22 DIAGNOSIS — L570 Actinic keratosis: Secondary | ICD-10-CM | POA: Diagnosis not present

## 2017-08-23 ENCOUNTER — Ambulatory Visit (INDEPENDENT_AMBULATORY_CARE_PROVIDER_SITE_OTHER): Payer: Medicare Other | Admitting: Urology

## 2017-08-23 DIAGNOSIS — N401 Enlarged prostate with lower urinary tract symptoms: Secondary | ICD-10-CM

## 2017-08-23 DIAGNOSIS — N5201 Erectile dysfunction due to arterial insufficiency: Secondary | ICD-10-CM

## 2017-08-30 ENCOUNTER — Encounter (INDEPENDENT_AMBULATORY_CARE_PROVIDER_SITE_OTHER): Payer: Medicare Other | Admitting: Ophthalmology

## 2017-08-31 DIAGNOSIS — Z6841 Body Mass Index (BMI) 40.0 and over, adult: Secondary | ICD-10-CM | POA: Diagnosis not present

## 2017-08-31 DIAGNOSIS — F419 Anxiety disorder, unspecified: Secondary | ICD-10-CM | POA: Diagnosis not present

## 2017-09-20 ENCOUNTER — Encounter (INDEPENDENT_AMBULATORY_CARE_PROVIDER_SITE_OTHER): Payer: Medicare Other | Admitting: Ophthalmology

## 2017-09-20 DIAGNOSIS — H35033 Hypertensive retinopathy, bilateral: Secondary | ICD-10-CM

## 2017-09-20 DIAGNOSIS — H43813 Vitreous degeneration, bilateral: Secondary | ICD-10-CM | POA: Diagnosis not present

## 2017-09-20 DIAGNOSIS — I10 Essential (primary) hypertension: Secondary | ICD-10-CM | POA: Diagnosis not present

## 2017-09-20 DIAGNOSIS — H338 Other retinal detachments: Secondary | ICD-10-CM | POA: Diagnosis not present

## 2017-09-28 DIAGNOSIS — L110 Acquired keratosis follicularis: Secondary | ICD-10-CM | POA: Diagnosis not present

## 2017-09-28 DIAGNOSIS — I739 Peripheral vascular disease, unspecified: Secondary | ICD-10-CM | POA: Diagnosis not present

## 2017-09-28 DIAGNOSIS — B351 Tinea unguium: Secondary | ICD-10-CM | POA: Diagnosis not present

## 2017-11-01 ENCOUNTER — Encounter (INDEPENDENT_AMBULATORY_CARE_PROVIDER_SITE_OTHER): Payer: Medicare Other | Admitting: Ophthalmology

## 2017-11-01 DIAGNOSIS — H35372 Puckering of macula, left eye: Secondary | ICD-10-CM | POA: Diagnosis not present

## 2017-11-01 DIAGNOSIS — H338 Other retinal detachments: Secondary | ICD-10-CM | POA: Diagnosis not present

## 2017-11-01 DIAGNOSIS — I10 Essential (primary) hypertension: Secondary | ICD-10-CM

## 2017-11-01 DIAGNOSIS — D3131 Benign neoplasm of right choroid: Secondary | ICD-10-CM | POA: Diagnosis not present

## 2017-11-01 DIAGNOSIS — H43811 Vitreous degeneration, right eye: Secondary | ICD-10-CM

## 2017-11-01 DIAGNOSIS — H35033 Hypertensive retinopathy, bilateral: Secondary | ICD-10-CM | POA: Diagnosis not present

## 2017-11-13 ENCOUNTER — Other Ambulatory Visit: Payer: Self-pay | Admitting: Otolaryngology

## 2017-11-16 ENCOUNTER — Ambulatory Visit (INDEPENDENT_AMBULATORY_CARE_PROVIDER_SITE_OTHER): Payer: Medicare Other | Admitting: Ophthalmology

## 2017-12-04 DIAGNOSIS — E782 Mixed hyperlipidemia: Secondary | ICD-10-CM | POA: Diagnosis not present

## 2017-12-04 DIAGNOSIS — Z6841 Body Mass Index (BMI) 40.0 and over, adult: Secondary | ICD-10-CM | POA: Diagnosis not present

## 2017-12-04 DIAGNOSIS — I2510 Atherosclerotic heart disease of native coronary artery without angina pectoris: Secondary | ICD-10-CM | POA: Diagnosis not present

## 2017-12-04 DIAGNOSIS — N400 Enlarged prostate without lower urinary tract symptoms: Secondary | ICD-10-CM | POA: Diagnosis not present

## 2017-12-04 DIAGNOSIS — Z1389 Encounter for screening for other disorder: Secondary | ICD-10-CM | POA: Diagnosis not present

## 2017-12-04 DIAGNOSIS — E785 Hyperlipidemia, unspecified: Secondary | ICD-10-CM | POA: Diagnosis not present

## 2017-12-04 DIAGNOSIS — Z0001 Encounter for general adult medical examination with abnormal findings: Secondary | ICD-10-CM | POA: Diagnosis not present

## 2017-12-04 DIAGNOSIS — I10 Essential (primary) hypertension: Secondary | ICD-10-CM | POA: Diagnosis not present

## 2017-12-04 DIAGNOSIS — G4730 Sleep apnea, unspecified: Secondary | ICD-10-CM | POA: Diagnosis not present

## 2017-12-11 ENCOUNTER — Encounter (HOSPITAL_BASED_OUTPATIENT_CLINIC_OR_DEPARTMENT_OTHER): Payer: Self-pay | Admitting: *Deleted

## 2017-12-11 ENCOUNTER — Other Ambulatory Visit: Payer: Self-pay

## 2017-12-19 ENCOUNTER — Other Ambulatory Visit: Payer: Self-pay

## 2017-12-19 ENCOUNTER — Encounter (HOSPITAL_BASED_OUTPATIENT_CLINIC_OR_DEPARTMENT_OTHER)
Admission: RE | Disposition: A | Discharge status: Home / Self Care | Payer: Self-pay | Source: Ambulatory Visit | Attending: Otolaryngology

## 2017-12-19 ENCOUNTER — Encounter (HOSPITAL_BASED_OUTPATIENT_CLINIC_OR_DEPARTMENT_OTHER): Payer: Self-pay | Admitting: Anesthesiology

## 2017-12-19 ENCOUNTER — Ambulatory Visit (HOSPITAL_BASED_OUTPATIENT_CLINIC_OR_DEPARTMENT_OTHER)
Admission: RE | Admit: 2017-12-19 | Discharge: 2017-12-20 | Disposition: A | Discharge status: Home / Self Care | Payer: Medicare Other | Source: Ambulatory Visit | Attending: Otolaryngology | Admitting: Otolaryngology

## 2017-12-19 ENCOUNTER — Ambulatory Visit (HOSPITAL_BASED_OUTPATIENT_CLINIC_OR_DEPARTMENT_OTHER): Payer: Medicare Other | Admitting: Anesthesiology

## 2017-12-19 DIAGNOSIS — F419 Anxiety disorder, unspecified: Secondary | ICD-10-CM | POA: Insufficient documentation

## 2017-12-19 DIAGNOSIS — Z9889 Other specified postprocedural states: Secondary | ICD-10-CM

## 2017-12-19 DIAGNOSIS — J343 Hypertrophy of nasal turbinates: Secondary | ICD-10-CM | POA: Insufficient documentation

## 2017-12-19 DIAGNOSIS — Z79899 Other long term (current) drug therapy: Secondary | ICD-10-CM | POA: Insufficient documentation

## 2017-12-19 DIAGNOSIS — I2510 Atherosclerotic heart disease of native coronary artery without angina pectoris: Secondary | ICD-10-CM | POA: Diagnosis not present

## 2017-12-19 DIAGNOSIS — I4891 Unspecified atrial fibrillation: Secondary | ICD-10-CM | POA: Diagnosis not present

## 2017-12-19 DIAGNOSIS — Z6839 Body mass index (BMI) 39.0-39.9, adult: Secondary | ICD-10-CM | POA: Insufficient documentation

## 2017-12-19 DIAGNOSIS — J3489 Other specified disorders of nose and nasal sinuses: Secondary | ICD-10-CM | POA: Insufficient documentation

## 2017-12-19 DIAGNOSIS — G4733 Obstructive sleep apnea (adult) (pediatric): Secondary | ICD-10-CM | POA: Diagnosis not present

## 2017-12-19 DIAGNOSIS — I10 Essential (primary) hypertension: Secondary | ICD-10-CM | POA: Diagnosis not present

## 2017-12-19 DIAGNOSIS — F329 Major depressive disorder, single episode, unspecified: Secondary | ICD-10-CM | POA: Diagnosis not present

## 2017-12-19 DIAGNOSIS — J342 Deviated nasal septum: Secondary | ICD-10-CM | POA: Insufficient documentation

## 2017-12-19 HISTORY — PX: NASAL SEPTOPLASTY W/ TURBINOPLASTY: SHX2070

## 2017-12-19 SURGERY — SEPTOPLASTY, NOSE, WITH NASAL TURBINATE REDUCTION
Anesthesia: General | Site: Nose | Laterality: Bilateral

## 2017-12-19 MED ORDER — SUGAMMADEX SODIUM 200 MG/2ML IV SOLN
INTRAVENOUS | Status: DC | PRN
  Administered 2017-12-19: 200 mg via INTRAVENOUS

## 2017-12-19 MED ORDER — FENTANYL CITRATE (PF) 100 MCG/2ML IJ SOLN
25.0000 ug | INTRAMUSCULAR | Status: DC | PRN
  Administered 2017-12-19: 50 ug via INTRAVENOUS
  Administered 2017-12-19 (×2): 25 ug via INTRAVENOUS

## 2017-12-19 MED ORDER — MUPIROCIN 2 % EX OINT
TOPICAL_OINTMENT | CUTANEOUS | Status: DC | PRN
  Administered 2017-12-19: 1 via TOPICAL

## 2017-12-19 MED ORDER — PROPOFOL 10 MG/ML IV BOLUS
INTRAVENOUS | Status: DC | PRN
  Administered 2017-12-19: 200 mg via INTRAVENOUS

## 2017-12-19 MED ORDER — MIDAZOLAM HCL 2 MG/2ML IJ SOLN: 1.0000 mg | INTRAMUSCULAR | Status: DC | PRN

## 2017-12-19 MED ORDER — LIDOCAINE HCL (CARDIAC) PF 100 MG/5ML IV SOSY
PREFILLED_SYRINGE | INTRAVENOUS | Status: AC
  Filled 2017-12-19: qty 5

## 2017-12-19 MED ORDER — TAMSULOSIN HCL 0.4 MG PO CAPS
0.4000 mg | ORAL_CAPSULE | ORAL | Status: DC
  Administered 2017-12-19: 0.4 mg via ORAL
  Filled 2017-12-19: qty 1

## 2017-12-19 MED ORDER — FENTANYL CITRATE (PF) 100 MCG/2ML IJ SOLN
INTRAMUSCULAR | Status: AC
  Filled 2017-12-19: qty 2

## 2017-12-19 MED ORDER — COCAINE HCL 4 % EX SOLN
CUTANEOUS | Status: DC | PRN
  Administered 2017-12-19: 4 mL via TOPICAL

## 2017-12-19 MED ORDER — ROCURONIUM BROMIDE 100 MG/10ML IV SOLN
INTRAVENOUS | Status: DC | PRN
  Administered 2017-12-19: 40 mg via INTRAVENOUS

## 2017-12-19 MED ORDER — NABUMETONE 500 MG PO TABS
750.0000 mg | ORAL_TABLET | Freq: Every day | ORAL | Status: DC
  Filled 2017-12-19: qty 1
  Filled 2017-12-19: qty 2

## 2017-12-19 MED ORDER — KCL IN DEXTROSE-NACL 20-5-0.45 MEQ/L-%-% IV SOLN
INTRAVENOUS | Status: DC
  Administered 2017-12-19: 14:00:00 via INTRAVENOUS
  Filled 2017-12-19: qty 1000

## 2017-12-19 MED ORDER — COCAINE HCL 4 % EX SOLN
CUTANEOUS | Status: AC
  Filled 2017-12-19: qty 4

## 2017-12-19 MED ORDER — ALPRAZOLAM 0.5 MG PO TABS
0.5000 mg | ORAL_TABLET | Freq: Every day | ORAL | Status: DC
  Administered 2017-12-19: 0.5 mg via ORAL
  Filled 2017-12-19: qty 2

## 2017-12-19 MED ORDER — SCOPOLAMINE 1 MG/3DAYS TD PT72: 1.0000 | MEDICATED_PATCH | Freq: Once | TRANSDERMAL | Status: DC | PRN

## 2017-12-19 MED ORDER — AMOXICILLIN 875 MG PO TABS: 875.0000 mg | ORAL_TABLET | Freq: Two times a day (BID) | ORAL | 0 refills | Status: AC

## 2017-12-19 MED ORDER — DIPHENHYDRAMINE HCL (SLEEP) 25 MG PO TABS
25.0000 mg | ORAL_TABLET | Freq: Every day | ORAL | Status: DC
  Administered 2017-12-19: 25 mg via ORAL

## 2017-12-19 MED ORDER — FENTANYL CITRATE (PF) 100 MCG/2ML IJ SOLN
50.0000 ug | INTRAMUSCULAR | Status: AC | PRN
  Administered 2017-12-19: 50 ug via INTRAVENOUS
  Administered 2017-12-19 (×2): 25 ug via INTRAVENOUS

## 2017-12-19 MED ORDER — PROMETHAZINE HCL 25 MG PO TABS: 25.0000 mg | ORAL_TABLET | Freq: Four times a day (QID) | ORAL | Status: DC | PRN

## 2017-12-19 MED ORDER — OXYMETAZOLINE HCL 0.05 % NA SOLN
NASAL | Status: DC | PRN
  Administered 2017-12-19: 1 via TOPICAL

## 2017-12-19 MED ORDER — ESMOLOL HCL 100 MG/10ML IV SOLN
INTRAVENOUS | Status: DC | PRN
  Administered 2017-12-19 (×2): 20 mg via INTRAVENOUS

## 2017-12-19 MED ORDER — ONDANSETRON HCL 4 MG/2ML IJ SOLN
INTRAMUSCULAR | Status: DC | PRN
  Administered 2017-12-19: 4 mg via INTRAVENOUS

## 2017-12-19 MED ORDER — OXYCODONE-ACETAMINOPHEN 5-325 MG PO TABS
1.0000 | ORAL_TABLET | ORAL | Status: DC | PRN
  Administered 2017-12-19: 1 via ORAL
  Filled 2017-12-19: qty 1

## 2017-12-19 MED ORDER — SIMVASTATIN 40 MG PO TABS: 40.0000 mg | ORAL_TABLET | Freq: Every day | ORAL | Status: DC

## 2017-12-19 MED ORDER — PROPOFOL 10 MG/ML IV BOLUS
INTRAVENOUS | Status: AC
  Filled 2017-12-19: qty 20

## 2017-12-19 MED ORDER — DEXAMETHASONE SODIUM PHOSPHATE 10 MG/ML IJ SOLN
INTRAMUSCULAR | Status: AC
  Filled 2017-12-19: qty 1

## 2017-12-19 MED ORDER — POTASSIUM CHLORIDE ER 10 MEQ PO TBCR: 10.0000 meq | EXTENDED_RELEASE_TABLET | Freq: Every day | ORAL | Status: DC | PRN

## 2017-12-19 MED ORDER — PHENYLEPHRINE 40 MCG/ML (10ML) SYRINGE FOR IV PUSH (FOR BLOOD PRESSURE SUPPORT)
PREFILLED_SYRINGE | INTRAVENOUS | Status: DC | PRN
  Administered 2017-12-19: 80 ug via INTRAVENOUS

## 2017-12-19 MED ORDER — ONDANSETRON HCL 4 MG/2ML IJ SOLN
INTRAMUSCULAR | Status: AC
  Filled 2017-12-19: qty 2

## 2017-12-19 MED ORDER — MORPHINE SULFATE (PF) 2 MG/ML IV SOLN: 2.0000 mg | INTRAVENOUS | Status: DC | PRN

## 2017-12-19 MED ORDER — FINASTERIDE 5 MG PO TABS
5.0000 mg | ORAL_TABLET | Freq: Every day | ORAL | Status: DC
  Administered 2017-12-19: 5 mg via ORAL
  Filled 2017-12-19: qty 1

## 2017-12-19 MED ORDER — DIPHENHYDRAMINE HCL 25 MG PO CAPS
ORAL_CAPSULE | ORAL | Status: AC
  Filled 2017-12-19: qty 1

## 2017-12-19 MED ORDER — BRIMONIDINE TARTRATE 0.2 % OP SOLN: 1.0000 [drp] | Freq: Two times a day (BID) | OPHTHALMIC | Status: DC

## 2017-12-19 MED ORDER — ONDANSETRON HCL 4 MG/2ML IJ SOLN: 4.0000 mg | Freq: Once | INTRAMUSCULAR | Status: DC | PRN

## 2017-12-19 MED ORDER — FUROSEMIDE 20 MG PO TABS: 20.0000 mg | ORAL_TABLET | Freq: Every day | ORAL | Status: DC | PRN

## 2017-12-19 MED ORDER — ESMOLOL HCL 100 MG/10ML IV SOLN
INTRAVENOUS | Status: AC
  Filled 2017-12-19: qty 10

## 2017-12-19 MED ORDER — PROMETHAZINE HCL 25 MG RE SUPP: 25.0000 mg | Freq: Four times a day (QID) | RECTAL | Status: DC | PRN

## 2017-12-19 MED ORDER — LIDOCAINE HCL (CARDIAC) PF 100 MG/5ML IV SOSY
PREFILLED_SYRINGE | INTRAVENOUS | Status: DC | PRN
  Administered 2017-12-19: 50 mg via INTRAVENOUS

## 2017-12-19 MED ORDER — LISINOPRIL 10 MG PO TABS
10.0000 mg | ORAL_TABLET | Freq: Every day | ORAL | Status: DC
  Administered 2017-12-19: 10 mg via ORAL
  Filled 2017-12-19: qty 1

## 2017-12-19 MED ORDER — LIDOCAINE-EPINEPHRINE 1 %-1:100000 IJ SOLN
INTRAMUSCULAR | Status: DC | PRN
  Administered 2017-12-19: 5 mL

## 2017-12-19 MED ORDER — OXYCODONE-ACETAMINOPHEN 5-325 MG PO TABS: 1.0000 | ORAL_TABLET | ORAL | 0 refills | Status: DC | PRN

## 2017-12-19 MED ORDER — LACTATED RINGERS IV SOLN
INTRAVENOUS | Status: DC
  Administered 2017-12-19: 11:00:00 via INTRAVENOUS

## 2017-12-19 MED ORDER — CEFAZOLIN SODIUM-DEXTROSE 2-3 GM-%(50ML) IV SOLR
INTRAVENOUS | Status: DC | PRN
  Administered 2017-12-19: 2 g via INTRAVENOUS

## 2017-12-19 SURGICAL SUPPLY — 33 items
ATTRACTOMAT 16X20 MAGNETIC DRP (DRAPES) IMPLANT
CANISTER SUCT 1200ML W/VALVE (MISCELLANEOUS) ×3 IMPLANT
COAGULATOR SUCT 8FR VV (MISCELLANEOUS) ×3 IMPLANT
DECANTER SPIKE VIAL GLASS SM (MISCELLANEOUS) IMPLANT
DRSG NASOPORE 8CM (GAUZE/BANDAGES/DRESSINGS) IMPLANT
DRSG TELFA 3X8 NADH (GAUZE/BANDAGES/DRESSINGS) IMPLANT
ELECT REM PT RETURN 9FT ADLT (ELECTROSURGICAL) ×3
ELECTRODE REM PT RTRN 9FT ADLT (ELECTROSURGICAL) ×1 IMPLANT
GLOVE BIO SURGEON STRL SZ7.5 (GLOVE) ×3 IMPLANT
GLOVE SURG SS PI 7.0 STRL IVOR (GLOVE) ×2 IMPLANT
GOWN STRL REUS W/ TWL LRG LVL3 (GOWN DISPOSABLE) ×2 IMPLANT
GOWN STRL REUS W/TWL LRG LVL3 (GOWN DISPOSABLE) ×6
NDL HYPO 25X1 1.5 SAFETY (NEEDLE) ×1 IMPLANT
NEEDLE HYPO 25X1 1.5 SAFETY (NEEDLE) ×3 IMPLANT
NS IRRIG 1000ML POUR BTL (IV SOLUTION) ×3 IMPLANT
PACK BASIN DAY SURGERY FS (CUSTOM PROCEDURE TRAY) ×3 IMPLANT
PACK ENT DAY SURGERY (CUSTOM PROCEDURE TRAY) ×3 IMPLANT
PAD DRESSING TELFA 3X8 NADH (GAUZE/BANDAGES/DRESSINGS) IMPLANT
SLEEVE SCD COMPRESS KNEE MED (MISCELLANEOUS) ×2 IMPLANT
SOLUTION BUTLER CLEAR DIP (MISCELLANEOUS) ×3 IMPLANT
SPLINT NASAL AIRWAY SILICONE (MISCELLANEOUS) ×2 IMPLANT
SPONGE GAUZE 2X2 8PLY STER LF (GAUZE/BANDAGES/DRESSINGS) ×1
SPONGE GAUZE 2X2 8PLY STRL LF (GAUZE/BANDAGES/DRESSINGS) ×2 IMPLANT
SPONGE NEURO XRAY DETECT 1X3 (DISPOSABLE) ×3 IMPLANT
SUT CHROMIC 4 0 P 3 18 (SUTURE) ×3 IMPLANT
SUT PLAIN 4 0 ~~LOC~~ 1 (SUTURE) ×3 IMPLANT
SUT PROLENE 3 0 PS 2 (SUTURE) ×3 IMPLANT
SUT VIC AB 4-0 P-3 18XBRD (SUTURE) IMPLANT
SUT VIC AB 4-0 P3 18 (SUTURE)
TOWEL GREEN STERILE FF (TOWEL DISPOSABLE) ×3 IMPLANT
TUBE SALEM SUMP 12R W/ARV (TUBING) IMPLANT
TUBE SALEM SUMP 16 FR W/ARV (TUBING) ×3 IMPLANT
YANKAUER SUCT BULB TIP NO VENT (SUCTIONS) ×3 IMPLANT

## 2017-12-19 NOTE — Op Note (Signed)
DATE OF PROCEDURE: 12/19/2017  OPERATIVE REPORT   SURGEON: [name], MD   PREOPERATIVE DIAGNOSES:  1. Severe nasal septal deviation.  2. Bilateral inferior turbinate hypertrophy.  3. Chronic nasal obstruction.  POSTOPERATIVE DIAGNOSES:  1. Severe nasal septal deviation.  2. Bilateral inferior turbinate hypertrophy.  3. Chronic nasal obstruction.  PROCEDURE PERFORMED:  1. Septoplasty.  2. Bilateral partial inferior turbinate resection.   ANESTHESIA: General endotracheal tube anesthesia.   COMPLICATIONS: None. He was in afib throughout the case.  ESTIMATED BLOOD LOSS: 400 mL.   INDICATION FOR PROCEDURE: THAI BURGUENO is a 77 y.o. male with a history of chronic nasal obstruction. The patient was  treated with antihistamine, decongestant, and steroid nasal spray. However, the patient continued to be symptomatic. On examination, the patient was noted to have bilateral severe inferior turbinate hypertrophy and significant nasal septal deviation, causing significant nasal obstruction. Based on the above findings, the decision was made for the patient to undergo the above-stated procedures. The risks, benefits, alternatives, and details of the procedures were discussed with the patient. Questions were invited and answered. Informed consent was obtained.   DESCRIPTION OF PROCEDURE: The patient was taken to the operating room and placed supine on the operating table. General endotracheal tube anesthesia was administered by the anesthesiologist. The patient was positioned, and prepped and draped in the standard fashion for nasal surgery. Pledgets soaked with Afrin were placed in both nasal cavities for decongestion. The pledgets were subsequently removed. The above mentioned severe septal deviation was again noted. 1% lidocaine with 1:100,000 epinephrine was injected onto the nasal septum bilaterally. A hemitransfixion incision was made on the left side. The mucosal flap was carefully elevated on the  left side. A cartilaginous incision was made 1 cm superior to the caudal margin of the nasal septum. Mucosal flap was also elevated on the right side in the similar fashion. It should be noted that due to the severe septal deviation, the deviated portion of the cartilaginous and bony septum had to be removed in piecemeal fashion. Once the deviated portions were removed, a straight midline septum was achieved. The septum was then quilted with 4-0 plain gut sutures. The hemitransfixion incision was closed with interrupted 4-0 chromic sutures. Doyle splints were applied.   Prior to the [facility] splint application, the inferior one half of both hypertrophied inferior turbinate was crossclamped with a Kelly clamp. The inferior one half of each inferior turbinate was then resected with a pair of cross cutting scissors. Hemostasis was achieved with a suction cautery device.   The care of the patient was turned over to the anesthesiologist. The patient was awakened from anesthesia without difficulty. The patient was extubated and transferred to the recovery room in good condition.   OPERATIVE FINDINGS: Severe nasal septal deviation and bilateral inferior turbinate hypertrophy.   SPECIMEN: None.   FOLLOWUP CARE: The patient will need cardiology evaluation for his atrial fibrillation. The patient will follow up in my office in approximately 2 days for splint removal.   Nevaeha Finerty Raynelle Bring, MD

## 2017-12-19 NOTE — Anesthesia Procedure Notes (Signed)
Procedure Name: Intubation Date/Time: 12/19/2017 10:44 AM Performed by: Lieutenant Diego, CRNA Pre-anesthesia Checklist: Patient identified, Emergency Drugs available, Suction available and Patient being monitored Patient Re-evaluated:Patient Re-evaluated prior to induction Oxygen Delivery Method: Circle system utilized Preoxygenation: Pre-oxygenation with 100% oxygen Induction Type: IV induction Ventilation: Mask ventilation without difficulty Grade View: Grade I Tube type: Oral Number of attempts: 1 Airway Equipment and Method: Stylet and Video-laryngoscopy Placement Confirmation: ETT inserted through vocal cords under direct vision,  positive ETCO2 and breath sounds checked- equal and bilateral Secured at: 23 cm Tube secured with: Tape Dental Injury: Teeth and Oropharynx as per pre-operative assessment  Difficulty Due To: Difficulty was anticipated, Difficult Airway- due to large tongue and Difficult Airway- due to limited oral opening Future Recommendations: Recommend- induction with short-acting agent, and alternative techniques readily available Comments: glide scope 4 blade, cords visualized, atraumatic oral intubation, =bbs

## 2017-12-19 NOTE — Transfer of Care (Signed)
Immediate Anesthesia Transfer of Care Note  Patient: Paul Lam  Procedure(s) Performed: NASAL SEPTOPLASTY WITH TURBINATE REDUCTION (Bilateral Nose)  Patient Location: PACU  Anesthesia Type:General  Level of Consciousness: awake  Airway & Oxygen Therapy: Patient Spontanous Breathing and Patient connected to face mask oxygen  Post-op Assessment: Report given to RN and Post -op Vital signs reviewed and stable  Post vital signs: Reviewed and stable  Last Vitals:  Vitals Value Taken Time  BP 135/68 12/19/2017 12:23 PM  Temp    Pulse 79 12/19/2017 12:26 PM  Resp 20 12/19/2017 12:26 PM  SpO2 100 % 12/19/2017 12:26 PM  Vitals shown include unvalidated device data.  Last Pain:  Vitals:   12/19/17 0930  TempSrc: Oral  PainSc: 0-No pain         Complications: No apparent anesthesia complications

## 2017-12-19 NOTE — Anesthesia Postprocedure Evaluation (Signed)
Anesthesia Post Note  Patient: Paul Lam  Procedure(s) Performed: NASAL SEPTOPLASTY WITH TURBINATE REDUCTION (Bilateral Nose)     Anesthesia Type: General Anesthetic complications: yes Anesthetic complication details: anesthesia complicationsComments: New onset Atrial Fibrillation intra-op.  Patient tolerated procedure well with stable vital signs despite new onset A-fib.  In PACU, EKG confirmed diagnosis of A-fib.  HR in 70s, BP stable, patient denies CP, SOB.  Spoke with Dr. Cathie Olden of Cardiology at 1241pm who stated that patient stable for followup as an outpatient in clinic since unable to start anticoagulation therapy in the post-operative period.  Dr. Cathie Olden also discussed with Dr. Benjamine Mola.  Patient to stay overnight in [place] for inability to tolerate CPAP following nasal septoplasty surgery for pulse ox monitoring.  Dr. Cathie Olden stated that he would arrange for patient followup in Cardiology clinic later this week.     Last Vitals:  Vitals:   12/19/17 1245 12/19/17 1300  BP: (!) 152/75 (!) 147/67  Pulse: 73 63  Resp: 17 17  Temp:    SpO2: 100% 100%    Last Pain:  Vitals:   12/19/17 1300  TempSrc:   PainSc: [place]

## 2017-12-19 NOTE — Anesthesia Preprocedure Evaluation (Signed)
Anesthesia Evaluation  Patient identified by MRN, date of birth, ID band Patient awake    Reviewed: Allergy & Precautions, NPO status , Patient's Chart, lab work & pertinent test results  History of Anesthesia Complications (+) PONV and history of anesthetic complications  Airway Mallampati: II  TM Distance: >3 FB Neck ROM: Full    Dental  (+) Teeth Intact, Dental Advisory Given   Pulmonary sleep apnea and Continuous Positive Airway Pressure Ventilation ,    Pulmonary exam normal breath sounds clear to auscultation       Cardiovascular hypertension, Pt. on medications + angina + CAD  Normal cardiovascular exam Rhythm:Regular Rate:Normal     Neuro/Psych  Headaches, PSYCHIATRIC DISORDERS Anxiety Depression    GI/Hepatic negative GI ROS, Neg liver ROS,   Endo/Other  Morbid obesity  Renal/GU negative Renal ROS     Musculoskeletal  (+) Arthritis ,   Abdominal   Peds  Hematology negative hematology ROS (+)   Anesthesia Other Findings Day of surgery medications reviewed with the patient.  Reproductive/Obstetrics                             Anesthesia Physical Anesthesia Plan  ASA: III  Anesthesia Plan: General   Post-op Pain Management:    Induction: Intravenous  PONV Risk Score and Plan: 3 and Dexamethasone and Ondansetron  Airway Management Planned: Oral ETT  Additional Equipment:   Intra-op Plan:   Post-operative Plan: Extubation in OR  Informed Consent: I have reviewed the patients History and Physical, chart, labs and discussed the procedure including the risks, benefits and alternatives for the proposed anesthesia with the patient or authorized representative who has indicated his/her understanding and acceptance.   Dental advisory given  Plan Discussed with: CRNA  Anesthesia Plan Comments:         Anesthesia Quick Evaluation

## 2017-12-19 NOTE — Discharge Instructions (Addendum)

## 2017-12-19 NOTE — H&P (Signed)
Cc: Chronic nasal obstruction  HPI: The patient is a 77 year old male who returns today for his follow-up evaluation.  The patient was previously seen 3 weeks ago.  At that time, he was noted to have significant nasal septal deviation and bilateral inferior turbinate hypertrophy.  No acute infection was noted at that time.  The patient previously has a history of recurrent sinusitis.  He previously underwent sinus surgery more than 20 years ago.  He subsequently underwent a paranasal sinus CT scan.  The CT showed no significant acute or chronic sinusitis.  However, he was noted to have bilateral severe inferior turbinate hypertrophy and nasal septal deviation.  The patient returns today complaining of persistent nasal obstruction bilaterally.  The patient has a history of sleep apnea.  He is currently using a CPAP machine.  However, he has significant difficultly with the CPAP machine due to his chronic nasal obstruction.  No other ENT, GI, or respiratory issue noted since the last visit.   Exam: General: Communicates without difficulty, well nourished, no acute distress. Head: Normocephalic, no evidence injury, no tenderness, facial buttresses intact without stepoff. Eyes: PERRL, EOMI. No scleral icterus, conjunctivae clear. Neuro: CN II exam reveals vision grossly intact.  No nystagmus at any point of gaze. Ears: Auricles well formed without lesions.  Ear canals are intact without mass or lesion.  No erythema or edema is appreciated.  The TMs are intact without fluid. Nose: External evaluation reveals normal support and skin without lesions.  Dorsum is intact.  Anterior rhinoscopy reveals congested and edematous mucosa over anterior aspect of the inferior turbinates and nasal septum. NSD noted. Oral:  Oral cavity and oropharynx are intact, symmetric, without erythema or edema.  Mucosa is moist without lesions. Neck: Full range of motion without pain.  There is no significant lymphadenopathy.  No masses  palpable.  Thyroid bed within normal limits to palpation.  Parotid glands and submandibular glands equal bilaterally without mass.  Trachea is midline. Neuro:  CN 2-12 grossly intact. Gait normal. Vestibular: No nystagmus at any point of gaze.    Assessment 1.  Chronic nasal obstruction, secondary to severe nasal septal deviation and bilateral inferior turbinate hypertrophy.  More than 95% of his nasal passageways are obstructed bilaterally.  2.  The patient has no significant acute or chronic sinusitis on his recent CT scan.  3.  History of obstructive sleep apnea.  He has difficulty using the CPAP machine due to his nasal obstruction.   Plan  1.  The physical exam findings and the CT images are extensively reviewed with the patient.  2.  He is reassured that no acute infection is noted at this time.  3.  The patient should continue with his daily Flonase nasal spray.  4.  The option of septoplasty and turbinate reduction to improve his nasal passageways is extensively discussed.  The risks, benefits, details of the procedure are reviewed with the patient.  5.  The patient would like to proceed with the procedure.

## 2017-12-20 ENCOUNTER — Ambulatory Visit (HOSPITAL_COMMUNITY): Payer: Medicare Other | Admitting: Nurse Practitioner

## 2017-12-20 ENCOUNTER — Encounter (HOSPITAL_BASED_OUTPATIENT_CLINIC_OR_DEPARTMENT_OTHER): Payer: Self-pay | Admitting: Otolaryngology

## 2017-12-20 ENCOUNTER — Telehealth: Payer: Self-pay | Admitting: Internal Medicine

## 2017-12-20 NOTE — Telephone Encounter (Signed)
Called patient wife, was able to notify of Afib clinic was notified of 2:30 appointment today unable to make it due to not being able to get in touch with patient on time, and notified of 9:00 appointment tomorrow with clinic. Patient wanted to see Dr.Teoh first tomorrow his appointment is at 2:00, he wanted to be seen by Afib clinic Friday. I notified patient that we had no openings on Friday, but was given the Afib clinic number for them to contact to schedule an appointment as soon as they could.

## 2017-12-20 NOTE — Telephone Encounter (Signed)
Spoke with DOD Dr.Kelly regarding the new onset afib. He advised to try to Afib clinic. I called and placed patient in an opening today at 2:30 if able to go. I attempted to contact all number on chart, LVM on wife's cell phone. Will continue to attempt to contact patients to notify of upcoming appointment at Afib clinic.

## 2017-12-20 NOTE — Telephone Encounter (Signed)
New Message:       Please call the pt's wife to schedule the appt on her cell phone at [phone]

## 2017-12-20 NOTE — Telephone Encounter (Signed)
New Message:      Dr. Benjamine Mola is calling and states that Dr. Johnsie Cancel saw this pt in the hosp as the on call Dr. He states that the pt needs to be seen due to new onset afib. He also states the pt needs to be seen to start having coumadin as well.

## 2017-12-20 NOTE — Discharge Summary (Signed)
Physician Discharge Summary  Patient ID: Paul Lam MRN: [id number] DOB/AGE: 76-Sep-1942 76 y.o.  Admit date: 12/19/2017 Discharge date: 12/20/2017  Admission Diagnoses: Chronic nasal obstruction  Discharge Diagnoses: Chronic nasal obstruction, atrial fibrillation Active Problems:   S/P nasal septoplasty   Discharged Condition: good  Hospital Course: Pt had an uneventful overnight stay. Pt tolerated po. No stridor. No adverse events.  Consults: Cardiology  Significant Diagnostic Studies: None  Treatments: surgery: Septoplasty and turbinate reduction  Discharge Exam: Blood pressure 120/73, pulse 64, temperature 97.9 F (36.6 C), resp. rate 18, height 5\' 8"  (1.727 m), weight 119.3 kg (263 lb), SpO2 95 %.   Disposition: Discharge disposition: 01-Home or Self Care       Discharge Instructions    Activity as tolerated - No restrictions   Complete by:  As directed    Diet general   Complete by:  As directed      Allergies as of 12/20/2017   No Known Allergies     Medication List    STOP taking these medications   bacitracin-polymyxin b ophthalmic ointment Commonly known as:  POLYSPORIN   dorzolamide 2 % ophthalmic solution Commonly known as:  TRUSOPT   escitalopram 10 MG tablet Commonly known as:  LEXAPRO   gatifloxacin 0.5 % Soln Commonly known as:  ZYMAXID   ketoconazole 2 % cream Commonly known as:  NIZORAL   latanoprost 0.005 % ophthalmic solution Commonly known as:  XALATAN   OMEGA-3 FISH OIL PO   prednisoLONE acetate 1 % ophthalmic suspension Commonly known as:  PRED FORTE   PREVAGEN EXTRA STRENGTH PO   REFRESH 1.4-0.6 % Soln Generic drug:  Polyvinyl Alcohol-Povidone PF   TART CHERRY ADVANCED PO     TAKE these medications   ALPRAZolam 0.5 MG tablet Commonly known as:  XANAX Take 0.5 mg at bedtime by mouth. Anxiety   amoxicillin 875 MG tablet Commonly known as:  AMOXIL Take 1 tablet (875 mg total) by mouth 2 (two) times daily for  5 days.   brimonidine 0.2 % ophthalmic solution Commonly known as:  ALPHAGAN Place 1 drop into the left eye 2 (two) times daily.   CITRUCEL PO Take 1 tablet daily by mouth.   finasteride 5 MG tablet Commonly known as:  PROSCAR Take 5 mg daily with supper by mouth.   furosemide 20 MG tablet Commonly known as:  LASIX Take 20 mg daily as needed by mouth (for fluid retention.).   lisinopril 10 MG tablet Commonly known as:  PRINIVIL,ZESTRIL Take 10 mg by mouth daily.   nabumetone 750 MG tablet Commonly known as:  RELAFEN Take 750 mg by mouth daily.   NON FORMULARY at bedtime. VPAP   oxyCODONE-acetaminophen 5-325 MG tablet Commonly known as:  PERCOCET/ROXICET Take 1 tablet by mouth every 4 (four) hours as needed for severe pain.   potassium chloride 10 MEQ tablet Commonly known as:  K-DUR Take 10 mEq daily as needed by mouth (with the lasix for fluid retention.). Take with Lasix   simvastatin 40 MG tablet Commonly known as:  ZOCOR Take 40 mg by mouth at bedtime.   SLEEP AID (DIPHENHYDRAMINE) 25 MG tablet Generic drug:  diphenhydrAMINE Take 25 mg by mouth at bedtime.   tamsulosin 0.4 MG Caps capsule Commonly known as:  FLOMAX Take 0.4 mg by mouth See admin instructions. Take 1 capsule (0.4 mg) by mouth daily with supper (~1830) & take 1 capsule (0.4 mg) by mouth at bedtime.   vitamin E 400 UNIT capsule  Take 400 Units by mouth daily.      Follow-up Information    [name], MD On 12/21/2017.   Specialty:  Otolaryngology Why:  As scheduled Contact information: [address] [place] [address] [phone]           Signed: Burley Saver 12/20/2017, 6:46 AM

## 2017-12-21 ENCOUNTER — Ambulatory Visit: Payer: Medicare Other | Admitting: Adult Health

## 2017-12-21 ENCOUNTER — Ambulatory Visit (INDEPENDENT_AMBULATORY_CARE_PROVIDER_SITE_OTHER): Payer: Medicare Other | Admitting: Otolaryngology

## 2017-12-21 MED FILL — Diphenhydramine HCl Cap 25 MG: ORAL | Qty: 1 | Status: AC

## 2017-12-22 ENCOUNTER — Encounter (HOSPITAL_COMMUNITY): Payer: Self-pay | Admitting: Nurse Practitioner

## 2017-12-22 ENCOUNTER — Ambulatory Visit (HOSPITAL_COMMUNITY)
Admission: RE | Admit: 2017-12-22 | Discharge: 2017-12-22 | Disposition: A | Discharge status: Home / Self Care | Payer: Medicare Other | Source: Ambulatory Visit | Attending: Nurse Practitioner | Admitting: Nurse Practitioner

## 2017-12-22 VITALS — BP 150/78 | HR 79 | Ht 68.0 in | Wt 266.0 lb

## 2017-12-22 DIAGNOSIS — Z79891 Long term (current) use of opiate analgesic: Secondary | ICD-10-CM | POA: Diagnosis not present

## 2017-12-22 DIAGNOSIS — M1990 Unspecified osteoarthritis, unspecified site: Secondary | ICD-10-CM | POA: Diagnosis not present

## 2017-12-22 DIAGNOSIS — F419 Anxiety disorder, unspecified: Secondary | ICD-10-CM | POA: Insufficient documentation

## 2017-12-22 DIAGNOSIS — Z85828 Personal history of other malignant neoplasm of skin: Secondary | ICD-10-CM | POA: Insufficient documentation

## 2017-12-22 DIAGNOSIS — G4733 Obstructive sleep apnea (adult) (pediatric): Secondary | ICD-10-CM | POA: Diagnosis not present

## 2017-12-22 DIAGNOSIS — Z823 Family history of stroke: Secondary | ICD-10-CM | POA: Insufficient documentation

## 2017-12-22 DIAGNOSIS — Z87442 Personal history of urinary calculi: Secondary | ICD-10-CM | POA: Diagnosis not present

## 2017-12-22 DIAGNOSIS — I10 Essential (primary) hypertension: Secondary | ICD-10-CM | POA: Diagnosis not present

## 2017-12-22 DIAGNOSIS — Z7901 Long term (current) use of anticoagulants: Secondary | ICD-10-CM | POA: Insufficient documentation

## 2017-12-22 DIAGNOSIS — I481 Persistent atrial fibrillation: Secondary | ICD-10-CM

## 2017-12-22 DIAGNOSIS — Z79899 Other long term (current) drug therapy: Secondary | ICD-10-CM | POA: Diagnosis not present

## 2017-12-22 DIAGNOSIS — Z8249 Family history of ischemic heart disease and other diseases of the circulatory system: Secondary | ICD-10-CM | POA: Insufficient documentation

## 2017-12-22 DIAGNOSIS — I4891 Unspecified atrial fibrillation: Secondary | ICD-10-CM | POA: Insufficient documentation

## 2017-12-22 DIAGNOSIS — Z96651 Presence of right artificial knee joint: Secondary | ICD-10-CM | POA: Insufficient documentation

## 2017-12-22 DIAGNOSIS — I4819 Other persistent atrial fibrillation: Secondary | ICD-10-CM

## 2017-12-22 DIAGNOSIS — F329 Major depressive disorder, single episode, unspecified: Secondary | ICD-10-CM | POA: Diagnosis not present

## 2017-12-22 DIAGNOSIS — I2510 Atherosclerotic heart disease of native coronary artery without angina pectoris: Secondary | ICD-10-CM | POA: Diagnosis not present

## 2017-12-22 DIAGNOSIS — E669 Obesity, unspecified: Secondary | ICD-10-CM | POA: Diagnosis not present

## 2017-12-22 DIAGNOSIS — Z9889 Other specified postprocedural states: Secondary | ICD-10-CM | POA: Diagnosis not present

## 2017-12-22 DIAGNOSIS — E785 Hyperlipidemia, unspecified: Secondary | ICD-10-CM | POA: Insufficient documentation

## 2017-12-22 MED ORDER — APIXABAN 5 MG PO TABS: 5.0000 mg | ORAL_TABLET | Freq: Two times a day (BID) | ORAL | 0 refills | Status: DC

## 2017-12-22 NOTE — Patient Instructions (Signed)
Do not start Eliquis 5mg  twice a day until we call you.

## 2017-12-22 NOTE — Progress Notes (Addendum)
Primary Care Physician: [name], MD Referring Physician: Dr. Benjamine Mola Cardiologist: Dr. Dionicia Abler Paul Lam is a 77 y.o. male with a h/o CAD, HTN, obesity,OSA uses cpap, that is in the afib clinic for evaluation for new onset afib found at time of sinus surgery this past Tuesday. He is rate controlled. Chadsvasc score of 4. He is unaware of any fatigue or shortness of breath, palpitations associated with afib. He is still having some small amount of bleeding from his nostrils. It is unsure of when his afib started as he is asymptomatic and has not seen a MD in some time.  He does not drink alcohol, minimal caffeine, uses Cpap.He is overweight and sedentary.  Today, he denies symptoms of palpitations, chest pain, shortness of breath, orthopnea, PND, lower extremity edema, dizziness, presyncope, syncope, or neurologic sequela. The patient is tolerating medications without difficulties and is otherwise without complaint today.   Past Medical History:  Diagnosis Date  . Anginal pain ([place])   . Anxiety   . Arthritis    generalized arthritis   . CAD (coronary artery disease)   . Cancer ([place])    skin cancer removed left ear    . Complication of anesthesia   . Depression   . Headache   . History of echocardiogram 02/19/2009   EF 50-55%; LA mild-mod dilated;   . History of kidney stones   . History of nuclear stress test 02/19/2009   low risk, normal   . Hyperlipidemia   . Hypertension   . Obesity (BMI 30-39.9)   . OSA (obstructive sleep apnea)    severe with AHI 25/hr now on BiPAP  . PONV (postoperative nausea and vomiting)   . Retinal detachment    with proliferative vitreoretinopathy  . Shortness of breath    related to weight   . Sleep apnea   . Vertigo    Past Surgical History:  Procedure Laterality Date  . APPENDECTOMY    . CARDIAC CATHETERIZATION  07/13/2005   noncritical CAD  . EYE SURGERY    . FLEXIBLE SIGMOIDOSCOPY N/A 07/14/2015   Procedure: FLEXIBLE  SIGMOIDOSCOPY;  Surgeon: Aviva Signs, MD;  [facility];  Service: Gastroenterology;  Laterality: N/A;  . JOINT REPLACEMENT    . NASAL SEPTOPLASTY W/ TURBINOPLASTY Bilateral 12/19/2017   Procedure: NASAL SEPTOPLASTY WITH TURBINATE REDUCTION;  Surgeon: [name], MD;  Location: [place];  Service: ENT;  Laterality: Bilateral;  . NASAL SINUS SURGERY    . PARS PLANA VITRECTOMY Left 06/20/2017   Procedure: PARS PLANA VITRECTOMY WITH 25 GAUGE, REPAIR OF COMPLEX TRACTION RETINAL DETACHMENT, MEMBRANE PEEEL;  Surgeon: [name], MD;  Location: [facility];  Service: Ophthalmology;  Laterality: Left;  . REPAIR OF COMPLEX TRACTION RETINAL DETACHMENT Left 06/20/2017  . RETINAL DETACHMENT SURGERY Right 04/2011  . SCLERAL BUCKLE WITH POSSIBLE 25 GAUGE PARS PLANA VITRECTOMY Left 05/22/2017   Procedure: SCLERAL BUCKLE, LASER, GAS INJECTION LEFT EYE;  Surgeon: [name], MD;  Location: [facility];  Service: Ophthalmology;  Laterality: Left;  . SKIN CANCER EXCISION Left 2013   left ear skin ca removed  2 weeks ago - not completeely healed 08/15/11   . TONSILLECTOMY    . TOTAL KNEE ARTHROPLASTY  08/22/2011   Procedure: TOTAL KNEE ARTHROPLASTY;  Surgeon: Gearlean Alf, MD;  [facility];  Service: Orthopedics;  Laterality: Right;    Current Outpatient Medications  Medication Sig Dispense Refill  . ALPRAZolam (XANAX) 0.5 MG  tablet Take 0.5 mg at bedtime by mouth. Anxiety     . amoxicillin (AMOXIL) 875 MG tablet Take 1 tablet (875 mg total) by mouth 2 (two) times daily for 5 days. 10 tablet 0  . brimonidine (ALPHAGAN) 0.2 % ophthalmic solution Place 1 drop into the left eye 2 (two) times daily. 5 mL 12  . diphenhydrAMINE (SLEEP AID, DIPHENHYDRAMINE,) 25 MG tablet Take 25 mg by mouth at bedtime as needed.     . finasteride (PROSCAR) 5 MG tablet Take 5 mg daily with supper by mouth.   10  . lisinopril (PRINIVIL,ZESTRIL) 10 MG tablet Take 10 mg by mouth daily.     . nabumetone  (RELAFEN) 750 MG tablet Take 750 mg by mouth daily.     . simvastatin (ZOCOR) 40 MG tablet Take 40 mg by mouth at bedtime.    . Tamsulosin HCl (FLOMAX) 0.4 MG CAPS Take 0.4 mg by mouth See admin instructions. Take 1 capsule (0.4 mg) by mouth daily with supper (~1830) & take 1 capsule (0.4 mg) by mouth at bedtime.    Marland Kitchen apixaban (ELIQUIS) 5 MG TABS tablet Take 1 tablet (5 mg total) by mouth 2 (two) times daily. 60 tablet 0  . furosemide (LASIX) 20 MG tablet Take 20 mg daily as needed by mouth (for fluid retention.).     Marland Kitchen Methylcellulose, Laxative, (CITRUCEL PO) Take 1 tablet daily by mouth.     . NON FORMULARY at bedtime. VPAP    . oxyCODONE-acetaminophen (PERCOCET/ROXICET) 5-325 MG tablet Take 1 tablet by mouth every 4 (four) hours as needed for severe pain. (Patient not taking: Reported on 12/22/2017) 15 tablet 0  . potassium chloride (K-DUR) 10 MEQ tablet Take 10 mEq daily as needed by mouth (with the lasix for fluid retention.). Take with Lasix  0  . vitamin E 400 UNIT capsule Take 400 Units by mouth daily.     No current facility-administered medications for this encounter.     No Known Allergies  Social History   Socioeconomic History  . Marital status: Married    Spouse name: Not on file  . Number of children: 1  . Years of education: Not on file  . Highest education level: Not on file  Occupational History  . Not on file  Social Needs  . Financial resource strain: Not on file  . Food insecurity:    Worry: Not on file    Inability: Not on file  . Transportation needs:    Medical: Not on file    Non-medical: Not on file  Tobacco Use  . Smoking status: Never Smoker  . Smokeless tobacco: Never Used  Substance and Sexual Activity  . Alcohol use: No    Frequency: Never  . Drug use: No  . Sexual activity: Not on file  Lifestyle  . Physical activity:    Days per week: Not on file    Minutes per session: Not on file  . Stress: Not on file  Relationships  . Social  connections:    Talks on phone: Not on file    Gets together: Not on file    Attends religious service: Not on file    Active member of club or organization: Not on file    Attends meetings of clubs or organizations: Not on file    Relationship status: Not on file  . Intimate partner violence:    Fear of current or ex partner: Not on file    Emotionally abused: Not on  file    Physically abused: Not on file    Forced sexual activity: Not on file  Other Topics Concern  . Not on file  Social History Narrative  . Not on file    Family History  Problem Relation Age of Onset  . Heart failure Mother        also arthritis  . Stroke Father        also emphysema  . Heart failure Father     ROS- All systems are reviewed and negative except as per the HPI above  Physical Exam: Vitals:   12/22/17 1114  BP: (!) 150/78  Pulse: 79  Weight: 266 lb (120.7 kg)  Height: 5\' 8"  (1.727 m)   Wt Readings from Last 3 Encounters:  12/22/17 266 lb (120.7 kg)  12/19/17 263 lb (119.3 kg)  06/20/17 245 lb 9.5 oz (111.4 kg)    Labs: Lab Results  Component Value Date   NA 139 06/20/2017   K 4.2 06/20/2017   CL 104 06/20/2017   CO2 27 06/20/2017   GLUCOSE 90 06/20/2017   BUN 14 06/20/2017   CREATININE 0.83 06/20/2017   CALCIUM 8.6 (L) 06/20/2017   Lab Results  Component Value Date   INR 1.11 08/15/2011   No results found for: CHOL, HDL, LDLCALC, TRIG   GEN- The patient is well appearing, alert and oriented x 3 today.   Head- normocephalic, atraumatic Eyes-  Sclera clear, conjunctiva pink Ears- hearing intact Oropharynx- clear Neck- supple, no JVP Lymph- no cervical lymphadenopathy Lungs- Clear to ausculation bilaterally, normal work of breathing Heart- Regular rate and rhythm, no murmurs, rubs or gallops, PMI not laterally displaced GI- soft, NT, ND, + BS Extremities- no clubbing, cyanosis, or edema MS- no significant deformity or atrophy Skin- no rash or lesion Psych-  euthymic mood, full affect Neuro- strength and sensation are intact  EKG- afib at 79 bpm, qrs int 92 ms, qtc 415 ms Epic records reviewed    Assessment and Plan: 1. New onset afib Asymptomatic  General education re afib He is rate controlled so will not add BB Echo Continue to use cpap Weight reduction and exercise encouraged  2.Chadsvasc score of 4 General education re anticoagulant and bleeding  Dr. Benjamine Mola said that anticoagulation could be started tomorrow Pt has had 2 detached retinas, last being in January of this year and have messaged Dr. Zigmund Daniel, pt's opthalmologist, to see if he has any concerns for me to start anticoagulation Pt has been given 30 day free Eliquis 5 mg bid and will start after in here back from Dr. West Pugh I will plan on cardioversion in 3 weeks if/when cleared to start anticoagulation  Addendum- 7/22- 9:30 am- Spoke to Dr. West Pugh re start of anticoagulation and he has no reason not to start drug from eye standpoint.  Pt informed to start anticoagulin.   Geroge Baseman Carroll, [facility] [address] [place], [place] [address] [phone]

## 2017-12-25 NOTE — Addendum Note (Signed)
Encounter addended by: Sherran Needs, NP on: 12/25/2017 9:56 AM  Actions taken: Sign clinical note

## 2017-12-27 DIAGNOSIS — L110 Acquired keratosis follicularis: Secondary | ICD-10-CM | POA: Diagnosis not present

## 2017-12-27 DIAGNOSIS — I739 Peripheral vascular disease, unspecified: Secondary | ICD-10-CM | POA: Diagnosis not present

## 2017-12-27 DIAGNOSIS — B351 Tinea unguium: Secondary | ICD-10-CM | POA: Diagnosis not present

## 2018-01-04 ENCOUNTER — Ambulatory Visit (INDEPENDENT_AMBULATORY_CARE_PROVIDER_SITE_OTHER): Payer: Medicare Other | Admitting: Otolaryngology

## 2018-01-08 ENCOUNTER — Ambulatory Visit (HOSPITAL_BASED_OUTPATIENT_CLINIC_OR_DEPARTMENT_OTHER)
Admission: RE | Admit: 2018-01-08 | Discharge: 2018-01-08 | Disposition: A | Discharge status: Home / Self Care | Payer: Medicare Other | Source: Ambulatory Visit | Attending: Nurse Practitioner | Admitting: Nurse Practitioner

## 2018-01-08 ENCOUNTER — Ambulatory Visit (HOSPITAL_COMMUNITY)
Admission: RE | Admit: 2018-01-08 | Discharge: 2018-01-08 | Disposition: A | Discharge status: Home / Self Care | Payer: Medicare Other | Source: Ambulatory Visit | Attending: Nurse Practitioner | Admitting: Nurse Practitioner

## 2018-01-08 ENCOUNTER — Encounter (HOSPITAL_COMMUNITY): Payer: Self-pay | Admitting: Nurse Practitioner

## 2018-01-08 ENCOUNTER — Other Ambulatory Visit: Payer: Self-pay

## 2018-01-08 ENCOUNTER — Other Ambulatory Visit (HOSPITAL_COMMUNITY): Payer: Self-pay | Admitting: *Deleted

## 2018-01-08 VITALS — BP 142/74 | HR 81 | Ht 68.0 in | Wt 261.6 lb

## 2018-01-08 DIAGNOSIS — Z85828 Personal history of other malignant neoplasm of skin: Secondary | ICD-10-CM | POA: Diagnosis not present

## 2018-01-08 DIAGNOSIS — Z96651 Presence of right artificial knee joint: Secondary | ICD-10-CM | POA: Insufficient documentation

## 2018-01-08 DIAGNOSIS — G4733 Obstructive sleep apnea (adult) (pediatric): Secondary | ICD-10-CM | POA: Diagnosis not present

## 2018-01-08 DIAGNOSIS — Z79899 Other long term (current) drug therapy: Secondary | ICD-10-CM | POA: Diagnosis not present

## 2018-01-08 DIAGNOSIS — M1990 Unspecified osteoarthritis, unspecified site: Secondary | ICD-10-CM | POA: Insufficient documentation

## 2018-01-08 DIAGNOSIS — E785 Hyperlipidemia, unspecified: Secondary | ICD-10-CM | POA: Insufficient documentation

## 2018-01-08 DIAGNOSIS — Z7901 Long term (current) use of anticoagulants: Secondary | ICD-10-CM | POA: Diagnosis not present

## 2018-01-08 DIAGNOSIS — I2510 Atherosclerotic heart disease of native coronary artery without angina pectoris: Secondary | ICD-10-CM | POA: Diagnosis not present

## 2018-01-08 DIAGNOSIS — Z6841 Body Mass Index (BMI) 40.0 and over, adult: Secondary | ICD-10-CM | POA: Diagnosis not present

## 2018-01-08 DIAGNOSIS — I10 Essential (primary) hypertension: Secondary | ICD-10-CM | POA: Insufficient documentation

## 2018-01-08 DIAGNOSIS — F419 Anxiety disorder, unspecified: Secondary | ICD-10-CM | POA: Insufficient documentation

## 2018-01-08 DIAGNOSIS — Z9889 Other specified postprocedural states: Secondary | ICD-10-CM | POA: Diagnosis not present

## 2018-01-08 DIAGNOSIS — Z823 Family history of stroke: Secondary | ICD-10-CM | POA: Diagnosis not present

## 2018-01-08 DIAGNOSIS — I481 Persistent atrial fibrillation: Secondary | ICD-10-CM | POA: Diagnosis not present

## 2018-01-08 DIAGNOSIS — I4891 Unspecified atrial fibrillation: Secondary | ICD-10-CM | POA: Insufficient documentation

## 2018-01-08 DIAGNOSIS — Z7983 Long term (current) use of bisphosphonates: Secondary | ICD-10-CM | POA: Diagnosis not present

## 2018-01-08 DIAGNOSIS — Z87442 Personal history of urinary calculi: Secondary | ICD-10-CM | POA: Insufficient documentation

## 2018-01-08 DIAGNOSIS — Z8249 Family history of ischemic heart disease and other diseases of the circulatory system: Secondary | ICD-10-CM | POA: Insufficient documentation

## 2018-01-08 DIAGNOSIS — E669 Obesity, unspecified: Secondary | ICD-10-CM | POA: Insufficient documentation

## 2018-01-08 DIAGNOSIS — F329 Major depressive disorder, single episode, unspecified: Secondary | ICD-10-CM | POA: Diagnosis not present

## 2018-01-08 DIAGNOSIS — I4819 Other persistent atrial fibrillation: Secondary | ICD-10-CM

## 2018-01-08 MED ORDER — APIXABAN 5 MG PO TABS: 5.0000 mg | ORAL_TABLET | Freq: Two times a day (BID) | ORAL | 6 refills | Status: DC

## 2018-01-08 NOTE — Progress Notes (Signed)
Primary Care Physician: [name], MD Referring Physician: Dr. Benjamine Mola Cardiologist: Dr. Dionicia Abler Paul Lam is a 77 y.o. male with a h/o CAD, HTN, obesity,OSA uses cpap, that is in the afib clinic for evaluation for new onset afib found at time of sinus surgery  12/19/17.Marland Kitchen He was rate controlled. Chadsvasc score of 4. He is unaware of any fatigue or shortness of breath, palpitations associated with afib. He was  still having some small amount of bleeding from his nostrils. It was unsure of when his afib started as he is asymptomatic and has not seen a MD in some time.  He did not drink alcohol, minimal caffeine, uses Cpap.He is overweight and sedentary.  He was started on Eliquis and is back today and not having any issues with the anticoagulation. We discussed cardioversion but he is asymptomatic and unsure if he wants to undergo cardioversion but wants to go on vacation first and will be back in 2 weeks. Echo done today and shows normal EF,  Today, he denies symptoms of palpitations, chest pain, shortness of breath, orthopnea, PND, lower extremity edema, dizziness, presyncope, syncope, or neurologic sequela. The patient is tolerating medications without difficulties and is otherwise without complaint today.   Past Medical History:  Diagnosis Date  . Anginal pain ([place])   . Anxiety   . Arthritis    generalized arthritis   . CAD (coronary artery disease)   . Cancer ([place])    skin cancer removed left ear    . Complication of anesthesia   . Depression   . Headache   . History of echocardiogram 02/19/2009   EF 50-55%; LA mild-mod dilated;   . History of kidney stones   . History of nuclear stress test 02/19/2009   low risk, normal   . Hyperlipidemia   . Hypertension   . Obesity (BMI 30-39.9)   . OSA (obstructive sleep apnea)    severe with AHI 25/hr now on BiPAP  . PONV (postoperative nausea and vomiting)   . Retinal detachment    with proliferative vitreoretinopathy  .  Shortness of breath    related to weight   . Sleep apnea   . Vertigo    Past Surgical History:  Procedure Laterality Date  . APPENDECTOMY    . CARDIAC CATHETERIZATION  07/13/2005   noncritical CAD  . EYE SURGERY    . FLEXIBLE SIGMOIDOSCOPY N/A 07/14/2015   Procedure: FLEXIBLE SIGMOIDOSCOPY;  Surgeon: Aviva Signs, MD;  [facility];  Service: Gastroenterology;  Laterality: N/A;  . JOINT REPLACEMENT    . NASAL SEPTOPLASTY W/ TURBINOPLASTY Bilateral 12/19/2017   Procedure: NASAL SEPTOPLASTY WITH TURBINATE REDUCTION;  Surgeon: [name], MD;  Location: [facility];  Service: ENT;  Laterality: Bilateral;  . NASAL SINUS SURGERY    . PARS PLANA VITRECTOMY Left 06/20/2017   Procedure: PARS PLANA VITRECTOMY WITH 25 GAUGE, REPAIR OF COMPLEX TRACTION RETINAL DETACHMENT, MEMBRANE PEEEL;  Surgeon: [name], MD;  Location: [facility];  Service: Ophthalmology;  Laterality: Left;  . REPAIR OF COMPLEX TRACTION RETINAL DETACHMENT Left 06/20/2017  . RETINAL DETACHMENT SURGERY Right 04/2011  . SCLERAL BUCKLE WITH POSSIBLE 25 GAUGE PARS PLANA VITRECTOMY Left 05/22/2017   Procedure: SCLERAL BUCKLE, LASER, GAS INJECTION LEFT EYE;  Surgeon: [name], MD;  Location: [place] [facility];  Service: Ophthalmology;  Laterality: Left;  . SKIN CANCER EXCISION Left 2013   left ear skin ca removed  2 weeks ago - not completeely  healed 08/15/11   . TONSILLECTOMY    . TOTAL KNEE ARTHROPLASTY  08/22/2011   Procedure: TOTAL KNEE ARTHROPLASTY;  Surgeon: Gearlean Alf, MD;  [facility];  Service: Orthopedics;  Laterality: Right;    Current Outpatient Medications  Medication Sig Dispense Refill  . ALPRAZolam (XANAX) 0.5 MG tablet Take 0.5 mg at bedtime by mouth. Anxiety     . apixaban (ELIQUIS) 5 MG TABS tablet Take 1 tablet (5 mg total) by mouth 2 (two) times daily. 60 tablet 0  . Apoaequorin (PREVAGEN) 10 MG CAPS Take 1 capsule by mouth daily with lunch.    . brimonidine (ALPHAGAN) 0.2 %  ophthalmic solution Place 1 drop into the left eye 2 (two) times daily. 5 mL 12  . finasteride (PROSCAR) 5 MG tablet Take 5 mg daily with supper by mouth.   10  . lisinopril (PRINIVIL,ZESTRIL) 10 MG tablet Take 10 mg by mouth daily.     . Methylcellulose, Laxative, (CITRUCEL PO) Take 1 tablet daily by mouth.     . nabumetone (RELAFEN) 750 MG tablet Take 750 mg by mouth daily.     . NON FORMULARY at bedtime. VPAP    . potassium chloride (K-DUR) 10 MEQ tablet Take 10 mEq daily as needed by mouth (with the lasix for fluid retention.). Take with Lasix  0  . simvastatin (ZOCOR) 40 MG tablet Take 40 mg by mouth at bedtime.    . Tamsulosin HCl (FLOMAX) 0.4 MG CAPS Take 0.4 mg by mouth See admin instructions. Take 1 capsule (0.4 mg) by mouth daily with supper (~1830) & take 1 capsule (0.4 mg) by mouth at bedtime.    . vitamin E 400 UNIT capsule Take 400 Units by mouth daily.    . diphenhydrAMINE (SLEEP AID, DIPHENHYDRAMINE,) 25 MG tablet Take 25 mg by mouth at bedtime as needed.     . furosemide (LASIX) 20 MG tablet Take 20 mg daily as needed by mouth (for fluid retention.).      No current facility-administered medications for this encounter.     No Known Allergies  Social History   Socioeconomic History  . Marital status: Married    Spouse name: Not on file  . Number of children: 1  . Years of education: Not on file  . Highest education level: Not on file  Occupational History  . Not on file  Social Needs  . Financial resource strain: Not on file  . Food insecurity:    Worry: Not on file    Inability: Not on file  . Transportation needs:    Medical: Not on file    Non-medical: Not on file  Tobacco Use  . Smoking status: Never Smoker  . Smokeless tobacco: Never Used  Substance and Sexual Activity  . Alcohol use: No    Frequency: Never  . Drug use: No  . Sexual activity: Not on file  Lifestyle  . Physical activity:    Days per week: Not on file    Minutes per session: Not on  file  . Stress: Not on file  Relationships  . Social connections:    Talks on phone: Not on file    Gets together: Not on file    Attends religious service: Not on file    Active member of club or organization: Not on file    Attends meetings of clubs or organizations: Not on file    Relationship status: Not on file  . Intimate partner violence:  Fear of current or ex partner: Not on file    Emotionally abused: Not on file    Physically abused: Not on file    Forced sexual activity: Not on file  Other Topics Concern  . Not on file  Social History Narrative  . Not on file    Family History  Problem Relation Age of Onset  . Heart failure Mother        also arthritis  . Stroke Father        also emphysema  . Heart failure Father     ROS- All systems are reviewed and negative except as per the HPI above  Physical Exam: Vitals:   01/08/18 1348  BP: (!) 142/74  Pulse: 81  Weight: 261 lb 9.6 oz (118.7 kg)  Height: 5\' 8"  (1.727 m)   Wt Readings from Last 3 Encounters:  01/08/18 261 lb 9.6 oz (118.7 kg)  12/22/17 266 lb (120.7 kg)  12/19/17 263 lb (119.3 kg)    Labs: Lab Results  Component Value Date   NA 139 06/20/2017   K 4.2 06/20/2017   CL 104 06/20/2017   CO2 27 06/20/2017   GLUCOSE 90 06/20/2017   BUN 14 06/20/2017   CREATININE 0.83 06/20/2017   CALCIUM 8.6 (L) 06/20/2017   Lab Results  Component Value Date   INR 1.11 08/15/2011   No results found for: CHOL, HDL, LDLCALC, TRIG   GEN- The patient is well appearing, alert and oriented x 3 today.   Head- normocephalic, atraumatic Eyes-  Sclera clear, conjunctiva pink Ears- hearing intact Oropharynx- clear Neck- supple, no JVP Lymph- no cervical lymphadenopathy Lungs- Clear to ausculation bilaterally, normal work of breathing Heart- irregular rate and rhythm, no murmurs, rubs or gallops, PMI not laterally displaced GI- soft, NT, ND, + BS Extremities- no clubbing, cyanosis, or edema MS- no  significant deformity or atrophy Skin- no rash or lesion Psych- euthymic mood, full affect Neuro- strength and sensation are intact  EKG- afib at 81 bpm, qrs int 90 ms, qtc 427 ms Epic records reviewed Echo-Study Conclusions  - Left ventricle: The cavity size was normal. Wall thickness was   increased in a pattern of mild LVH. Systolic function was normal.   The estimated ejection fraction was in the range of 55% to 60%.   Wall motion was normal; there were no regional wall motion   abnormalities. - Aortic valve: There was mild regurgitation. Valve area (Vmax):   2.19 cm^2. - Mitral valve: Calcified annulus. There was mild regurgitation.  Impressions:  - Normal LV systolic function; mild LVH; calcified aortic valve   with mild AI; mild MR.   Assessment and Plan: 1. New onset afib Asymptomatic  He is rate controlled, BB not needed Echo today and reviewed with pt Continue to use cpap Weight reduction and exercise encouraged  2.Chadsvasc score of 4 Doing well with the addition of apixaban 5 mg bid  No bleeding issues  F/u in 2 weeks and will further discuss if pt wants to pursue cardioversion as he has not been on anticoagulation long enough to pursue today and he wished to  go on vacation before discussing further   Butch Penny C. Carroll, [facility] [address] [place], [place] [address] [phone]

## 2018-01-08 NOTE — Progress Notes (Signed)
Echocardiogram 2D Echocardiogram has been performed.  Paul Lam 01/08/2018, 1:32 PM

## 2018-01-25 ENCOUNTER — Other Ambulatory Visit (HOSPITAL_COMMUNITY): Payer: Self-pay | Admitting: Nurse Practitioner

## 2018-01-25 ENCOUNTER — Encounter (HOSPITAL_COMMUNITY): Payer: Self-pay | Admitting: Nurse Practitioner

## 2018-01-25 ENCOUNTER — Ambulatory Visit (HOSPITAL_COMMUNITY)
Admission: RE | Admit: 2018-01-25 | Discharge: 2018-01-25 | Disposition: A | Discharge status: Home / Self Care | Payer: Medicare Other | Source: Ambulatory Visit | Attending: Nurse Practitioner | Admitting: Nurse Practitioner

## 2018-01-25 VITALS — BP 132/70 | HR 65 | Ht 68.0 in | Wt 265.8 lb

## 2018-01-25 DIAGNOSIS — E669 Obesity, unspecified: Secondary | ICD-10-CM | POA: Insufficient documentation

## 2018-01-25 DIAGNOSIS — Z6839 Body mass index (BMI) 39.0-39.9, adult: Secondary | ICD-10-CM | POA: Diagnosis not present

## 2018-01-25 DIAGNOSIS — Z7901 Long term (current) use of anticoagulants: Secondary | ICD-10-CM | POA: Insufficient documentation

## 2018-01-25 DIAGNOSIS — Z85828 Personal history of other malignant neoplasm of skin: Secondary | ICD-10-CM | POA: Diagnosis not present

## 2018-01-25 DIAGNOSIS — I10 Essential (primary) hypertension: Secondary | ICD-10-CM | POA: Diagnosis not present

## 2018-01-25 DIAGNOSIS — I2510 Atherosclerotic heart disease of native coronary artery without angina pectoris: Secondary | ICD-10-CM | POA: Diagnosis not present

## 2018-01-25 DIAGNOSIS — I480 Paroxysmal atrial fibrillation: Secondary | ICD-10-CM

## 2018-01-25 DIAGNOSIS — I4891 Unspecified atrial fibrillation: Secondary | ICD-10-CM | POA: Insufficient documentation

## 2018-01-25 DIAGNOSIS — Z79899 Other long term (current) drug therapy: Secondary | ICD-10-CM | POA: Insufficient documentation

## 2018-01-25 DIAGNOSIS — F419 Anxiety disorder, unspecified: Secondary | ICD-10-CM | POA: Diagnosis not present

## 2018-01-25 DIAGNOSIS — Z87442 Personal history of urinary calculi: Secondary | ICD-10-CM | POA: Insufficient documentation

## 2018-01-25 DIAGNOSIS — G4733 Obstructive sleep apnea (adult) (pediatric): Secondary | ICD-10-CM | POA: Diagnosis not present

## 2018-01-25 DIAGNOSIS — E785 Hyperlipidemia, unspecified: Secondary | ICD-10-CM | POA: Diagnosis not present

## 2018-01-25 DIAGNOSIS — F329 Major depressive disorder, single episode, unspecified: Secondary | ICD-10-CM | POA: Diagnosis not present

## 2018-01-25 LAB — CBC WITH DIFFERENTIAL/PLATELET
Abs Immature Granulocytes: 0 10*3/uL (ref 0.0–0.1)
Basophils Absolute: 0 10*3/uL (ref 0.0–0.1)
Basophils Relative: 0 %
EOS PCT: 3 %
Eosinophils Absolute: 0.2 10*3/uL (ref 0.0–0.7)
HEMATOCRIT: 44.3 % (ref 39.0–52.0)
HEMOGLOBIN: 14 g/dL (ref 13.0–17.0)
Immature Granulocytes: 0 %
LYMPHS ABS: 1.9 10*3/uL (ref 0.7–4.0)
LYMPHS PCT: 27 %
MCH: 28.6 pg (ref 26.0–34.0)
MCHC: 31.6 g/dL (ref 30.0–36.0)
MCV: 90.6 fL (ref 78.0–100.0)
MONO ABS: 0.6 10*3/uL (ref 0.1–1.0)
MONOS PCT: 8 %
Neutro Abs: 4.2 10*3/uL (ref 1.7–7.7)
Neutrophils Relative %: 62 %
Platelets: 143 10*3/uL — ABNORMAL LOW (ref 150–400)
RBC: 4.89 MIL/uL (ref 4.22–5.81)
RDW: 13.5 % (ref 11.5–15.5)
WBC: 6.9 10*3/uL (ref 4.0–10.5)

## 2018-01-25 LAB — BASIC METABOLIC PANEL
ANION GAP: 6 (ref 5–15)
BUN: 15 mg/dL (ref 8–23)
CO2: 31 mmol/L (ref 22–32)
CREATININE: 0.8 mg/dL (ref 0.61–1.24)
Calcium: 8.9 mg/dL (ref 8.9–10.3)
Chloride: 105 mmol/L (ref 98–111)
GFR calc non Af Amer: >60 mL/min (ref >60)
GLUCOSE: 99 mg/dL (ref 70–99)
Potassium: 4.1 mmol/L (ref 3.5–5.1)
Sodium: 142 mmol/L (ref 135–145)

## 2018-01-25 MED ORDER — APIXABAN 5 MG PO TABS: 5.0000 mg | ORAL_TABLET | Freq: Two times a day (BID) | ORAL | 6 refills | Status: DC

## 2018-01-25 NOTE — H&P (View-Only) (Signed)
Primary Care Physician: [name], MD Referring Physician: Dr. Benjamine Mola Cardiologist: Dr. Dionicia Abler Paul Lam is a 77 y.o. male with a h/o CAD, HTN, obesity,OSA uses cpap, that is in the afib clinic for evaluation for new onset afib found at time of sinus surgery  12/19/17.Marland Kitchen He was rate controlled. Chadsvasc score of 4. He is unaware of any fatigue or shortness of breath, palpitations associated with afib. He was  still having some small amount of bleeding from his nostrils. It was unsure of when his afib started as he is asymptomatic and has not seen a MD in some time.  He did not drink alcohol, minimal caffeine, uses Cpap.He is overweight and sedentary.  He was started on Eliquis and is back today and not having any issues with the anticoagulation. We discussed cardioversion but he is asymptomatic and unsure if he wants to undergo cardioversion but wants to go on vacation first and will be back in 2 weeks. Echo done today and shows normal EF.  F/u in afib clinic, 8/21. He is now back from his vacation and thinks he will pursue cardioversion. He still says that he is not symptomatic but he may see some improvement in energy with return to SR. He has not has any interruption in anticoagulation since eliquis was started.He is well rate controlled and has not required any rate control meds.  Today, he denies symptoms of palpitations, chest pain, shortness of breath, orthopnea, PND, lower extremity edema, dizziness, presyncope, syncope, or neurologic sequela. The patient is tolerating medications without difficulties and is otherwise without complaint today.   Past Medical History:  Diagnosis Date  . Anginal pain ([place])   . Anxiety   . Arthritis    generalized arthritis   . CAD (coronary artery disease)   . Cancer ([place])    skin cancer removed left ear    . Complication of anesthesia   . Depression   . Headache   . History of echocardiogram 02/19/2009   EF 50-55%; LA mild-mod dilated;     . History of kidney stones   . History of nuclear stress test 02/19/2009   low risk, normal   . Hyperlipidemia   . Hypertension   . Obesity (BMI 30-39.9)   . OSA (obstructive sleep apnea)    severe with AHI 25/hr now on BiPAP  . PONV (postoperative nausea and vomiting)   . Retinal detachment    with proliferative vitreoretinopathy  . Shortness of breath    related to weight   . Sleep apnea   . Vertigo    Past Surgical History:  Procedure Laterality Date  . APPENDECTOMY    . CARDIAC CATHETERIZATION  07/13/2005   noncritical CAD  . EYE SURGERY    . FLEXIBLE SIGMOIDOSCOPY N/A 07/14/2015   Procedure: FLEXIBLE SIGMOIDOSCOPY;  Surgeon: Aviva Signs, MD;  [facility];  Service: Gastroenterology;  Laterality: N/A;  . JOINT REPLACEMENT    . NASAL SEPTOPLASTY W/ TURBINOPLASTY Bilateral 12/19/2017   Procedure: NASAL SEPTOPLASTY WITH TURBINATE REDUCTION;  Surgeon: [name], MD;  Location: [facility];  Service: ENT;  Laterality: Bilateral;  . NASAL SINUS SURGERY    . PARS PLANA VITRECTOMY Left 06/20/2017   Procedure: PARS PLANA VITRECTOMY WITH 25 GAUGE, REPAIR OF COMPLEX TRACTION RETINAL DETACHMENT, MEMBRANE PEEEL;  Surgeon: [name], MD;  Location: [facility];  Service: Ophthalmology;  Laterality: Left;  . REPAIR OF COMPLEX TRACTION RETINAL DETACHMENT Left 06/20/2017  . RETINAL  DETACHMENT SURGERY Right 04/2011  . SCLERAL BUCKLE WITH POSSIBLE 25 GAUGE PARS PLANA VITRECTOMY Left 05/22/2017   Procedure: SCLERAL BUCKLE, LASER, GAS INJECTION LEFT EYE;  Surgeon: [name], MD;  Location: [facility];  Service: Ophthalmology;  Laterality: Left;  . SKIN CANCER EXCISION Left 2013   left ear skin ca removed  2 weeks ago - not completeely healed 08/15/11   . TONSILLECTOMY    . TOTAL KNEE ARTHROPLASTY  08/22/2011   Procedure: TOTAL KNEE ARTHROPLASTY;  Surgeon: Gearlean Alf, MD;  [facility];  Service: Orthopedics;  Laterality: Right;    Current Outpatient Medications   Medication Sig Dispense Refill  . ALPRAZolam (XANAX) 0.5 MG tablet Take 0.5 mg at bedtime by mouth. Anxiety     . apixaban (ELIQUIS) 5 MG TABS tablet Take 1 tablet (5 mg total) by mouth 2 (two) times daily. 60 tablet 6  . Apoaequorin (PREVAGEN) 10 MG CAPS Take 1 capsule by mouth daily with lunch.    . brimonidine (ALPHAGAN) 0.2 % ophthalmic solution Place 1 drop into the left eye 2 (two) times daily. 5 mL 12  . diphenhydrAMINE (SLEEP AID, DIPHENHYDRAMINE,) 25 MG tablet Take 25 mg by mouth at bedtime as needed.     . finasteride (PROSCAR) 5 MG tablet Take 5 mg daily with supper by mouth.   10  . furosemide (LASIX) 20 MG tablet Take 20 mg daily as needed by mouth (for fluid retention.).     Marland Kitchen lisinopril (PRINIVIL,ZESTRIL) 10 MG tablet Take 10 mg by mouth daily.     . Methylcellulose, Laxative, (CITRUCEL PO) Take 1 tablet daily by mouth.     . nabumetone (RELAFEN) 750 MG tablet Take 750 mg by mouth daily.     . NON FORMULARY at bedtime. VPAP    . potassium chloride (K-DUR) 10 MEQ tablet Take 10 mEq daily as needed by mouth (with the lasix for fluid retention.). Take with Lasix  0  . simvastatin (ZOCOR) 40 MG tablet Take 40 mg by mouth at bedtime.    . Tamsulosin HCl (FLOMAX) 0.4 MG CAPS Take 0.4 mg by mouth See admin instructions. Take 1 capsule (0.4 mg) by mouth daily with supper (~1830) & take 1 capsule (0.4 mg) by mouth at bedtime.    . vitamin E 400 UNIT capsule Take 400 Units by mouth daily.     No current facility-administered medications for this encounter.     No Known Allergies  Social History   Socioeconomic History  . Marital status: Married    Spouse name: Not on file  . Number of children: 1  . Years of education: Not on file  . Highest education level: Not on file  Occupational History  . Not on file  Social Needs  . Financial resource strain: Not on file  . Food insecurity:    Worry: Not on file    Inability: Not on file  . Transportation needs:    Medical: Not on  file    Non-medical: Not on file  Tobacco Use  . Smoking status: Never Smoker  . Smokeless tobacco: Never Used  Substance and Sexual Activity  . Alcohol use: No    Frequency: Never  . Drug use: No  . Sexual activity: Not on file  Lifestyle  . Physical activity:    Days per week: Not on file    Minutes per session: Not on file  . Stress: Not on file  Relationships  . Social connections:  Talks on phone: Not on file    Gets together: Not on file    Attends religious service: Not on file    Active member of club or organization: Not on file    Attends meetings of clubs or organizations: Not on file    Relationship status: Not on file  . Intimate partner violence:    Fear of current or ex partner: Not on file    Emotionally abused: Not on file    Physically abused: Not on file    Forced sexual activity: Not on file  Other Topics Concern  . Not on file  Social History Narrative  . Not on file    Family History  Problem Relation Age of Onset  . Heart failure Mother        also arthritis  . Stroke Father        also emphysema  . Heart failure Father     ROS- All systems are reviewed and negative except as per the HPI above  Physical Exam: Vitals:   01/25/18 1332  BP: 132/70  Pulse: 65  Weight: 120.6 kg  Height: 5\' 8"  (1.727 m)   Wt Readings from Last 3 Encounters:  01/25/18 120.6 kg  01/08/18 118.7 kg  12/22/17 120.7 kg    Labs: Lab Results  Component Value Date   NA 139 06/20/2017   K 4.2 06/20/2017   CL 104 06/20/2017   CO2 27 06/20/2017   GLUCOSE 90 06/20/2017   BUN 14 06/20/2017   CREATININE 0.83 06/20/2017   CALCIUM 8.6 (L) 06/20/2017   Lab Results  Component Value Date   INR 1.11 08/15/2011   No results found for: CHOL, HDL, LDLCALC, TRIG   GEN- The patient is well appearing, alert and oriented x 3 today.   Head- normocephalic, atraumatic Eyes-  Sclera clear, conjunctiva pink Ears- hearing intact Oropharynx- clear Neck- supple, no  JVP Lymph- no cervical lymphadenopathy Lungs- Clear to ausculation bilaterally, normal work of breathing Heart- irregular rate and rhythm, no murmurs, rubs or gallops, PMI not laterally displaced GI- soft, NT, ND, + BS Extremities- no clubbing, cyanosis, or edema MS- no significant deformity or atrophy Skin- no rash or lesion Psych- euthymic mood, full affect Neuro- strength and sensation are intact  EKG- afib at 81 bpm, qrs int 90 ms, qtc 427 ms Epic records reviewed Echo-Study Conclusions  - Left ventricle: The cavity size was normal. Wall thickness was   increased in a pattern of mild LVH. Systolic function was normal.   The estimated ejection fraction was in the range of 55% to 60%.   Wall motion was normal; there were no regional wall motion   abnormalities. - Aortic valve: There was mild regurgitation. Valve area (Vmax):   2.19 cm^2. - Mitral valve: Calcified annulus. There was mild regurgitation.  Impressions:  - Normal LV systolic function; mild LVH; calcified aortic valve   with mild AI; mild MR.   Assessment and Plan: 1. New onset afib Asymptomatic, but feels he would like to pursue cardioversion to see if Sr can be restored Risk vrs benefit discussed He is rate controlled, BB not needed Continue to use cpap Weight reduction and exercise encouraged Bmet/mag today  2.Chadsvasc score of 4 Doing well with the addition of apixaban 5 mg bid, no missed doses since starting drug No bleeding issues  F/u one week after cardioversion  Butch Penny C. Railee Bonillas, [facility] [address] [place], [place] [address] [phone]

## 2018-01-25 NOTE — Progress Notes (Addendum)
Primary Care Physician: [name], MD Referring Physician: Dr. Benjamine Mola Cardiologist: Dr. Dionicia Abler Paul Lam is a 77 y.o. male with a h/o CAD, HTN, obesity,OSA uses cpap, that is in the afib clinic for evaluation for new onset afib found at time of sinus surgery  12/19/17.Marland Kitchen He was rate controlled. Chadsvasc score of 4. He is unaware of any fatigue or shortness of breath, palpitations associated with afib. He was  still having some small amount of bleeding from his nostrils. It was unsure of when his afib started as he is asymptomatic and has not seen a MD in some time.  He did not drink alcohol, minimal caffeine, uses Cpap.He is overweight and sedentary.  He was started on Eliquis and is back today and not having any issues with the anticoagulation. We discussed cardioversion but he is asymptomatic and unsure if he wants to undergo cardioversion but wants to go on vacation first and will be back in 2 weeks. Echo done today and shows normal EF.  F/u in afib clinic, 8/21. He is now back from his vacation and thinks he will pursue cardioversion. He still says that he is not symptomatic but he may see some improvement in energy with return to SR. He has not has any interruption in anticoagulation since eliquis was started.He is well rate controlled and has not required any rate control meds.  Today, he denies symptoms of palpitations, chest pain, shortness of breath, orthopnea, PND, lower extremity edema, dizziness, presyncope, syncope, or neurologic sequela. The patient is tolerating medications without difficulties and is otherwise without complaint today.   Past Medical History:  Diagnosis Date  . Anginal pain ([place])   . Anxiety   . Arthritis    generalized arthritis   . CAD (coronary artery disease)   . Cancer ([place])    skin cancer removed left ear    . Complication of anesthesia   . Depression   . Headache   . History of echocardiogram 02/19/2009   EF 50-55%; LA mild-mod dilated;     . History of kidney stones   . History of nuclear stress test 02/19/2009   low risk, normal   . Hyperlipidemia   . Hypertension   . Obesity (BMI 30-39.9)   . OSA (obstructive sleep apnea)    severe with AHI 25/hr now on BiPAP  . PONV (postoperative nausea and vomiting)   . Retinal detachment    with proliferative vitreoretinopathy  . Shortness of breath    related to weight   . Sleep apnea   . Vertigo    Past Surgical History:  Procedure Laterality Date  . APPENDECTOMY    . CARDIAC CATHETERIZATION  07/13/2005   noncritical CAD  . EYE SURGERY    . FLEXIBLE SIGMOIDOSCOPY N/A 07/14/2015   Procedure: FLEXIBLE SIGMOIDOSCOPY;  Surgeon: Aviva Signs, MD;  [facility];  Service: Gastroenterology;  Laterality: N/A;  . JOINT REPLACEMENT    . NASAL SEPTOPLASTY W/ TURBINOPLASTY Bilateral 12/19/2017   Procedure: NASAL SEPTOPLASTY WITH TURBINATE REDUCTION;  Surgeon: [name], MD;  Location: [facility];  Service: ENT;  Laterality: Bilateral;  . NASAL SINUS SURGERY    . PARS PLANA VITRECTOMY Left 06/20/2017   Procedure: PARS PLANA VITRECTOMY WITH 25 GAUGE, REPAIR OF COMPLEX TRACTION RETINAL DETACHMENT, MEMBRANE PEEEL;  Surgeon: [name], MD;  Location: [facility];  Service: Ophthalmology;  Laterality: Left;  . REPAIR OF COMPLEX TRACTION RETINAL DETACHMENT Left 06/20/2017  . RETINAL  DETACHMENT SURGERY Right 04/2011  . SCLERAL BUCKLE WITH POSSIBLE 25 GAUGE PARS PLANA VITRECTOMY Left 05/22/2017   Procedure: SCLERAL BUCKLE, LASER, GAS INJECTION LEFT EYE;  Surgeon: [name], MD;  Location: [facility];  Service: Ophthalmology;  Laterality: Left;  . SKIN CANCER EXCISION Left 2013   left ear skin ca removed  2 weeks ago - not completeely healed 08/15/11   . TONSILLECTOMY    . TOTAL KNEE ARTHROPLASTY  08/22/2011   Procedure: TOTAL KNEE ARTHROPLASTY;  Surgeon: Gearlean Alf, MD;  [facility];  Service: Orthopedics;  Laterality: Right;    Current Outpatient Medications   Medication Sig Dispense Refill  . ALPRAZolam (XANAX) 0.5 MG tablet Take 0.5 mg at bedtime by mouth. Anxiety     . apixaban (ELIQUIS) 5 MG TABS tablet Take 1 tablet (5 mg total) by mouth 2 (two) times daily. 60 tablet 6  . Apoaequorin (PREVAGEN) 10 MG CAPS Take 1 capsule by mouth daily with lunch.    . brimonidine (ALPHAGAN) 0.2 % ophthalmic solution Place 1 drop into the left eye 2 (two) times daily. 5 mL 12  . diphenhydrAMINE (SLEEP AID, DIPHENHYDRAMINE,) 25 MG tablet Take 25 mg by mouth at bedtime as needed.     . finasteride (PROSCAR) 5 MG tablet Take 5 mg daily with supper by mouth.   10  . furosemide (LASIX) 20 MG tablet Take 20 mg daily as needed by mouth (for fluid retention.).     Marland Kitchen lisinopril (PRINIVIL,ZESTRIL) 10 MG tablet Take 10 mg by mouth daily.     . Methylcellulose, Laxative, (CITRUCEL PO) Take 1 tablet daily by mouth.     . nabumetone (RELAFEN) 750 MG tablet Take 750 mg by mouth daily.     . NON FORMULARY at bedtime. VPAP    . potassium chloride (K-DUR) 10 MEQ tablet Take 10 mEq daily as needed by mouth (with the lasix for fluid retention.). Take with Lasix  0  . simvastatin (ZOCOR) 40 MG tablet Take 40 mg by mouth at bedtime.    . Tamsulosin HCl (FLOMAX) 0.4 MG CAPS Take 0.4 mg by mouth See admin instructions. Take 1 capsule (0.4 mg) by mouth daily with supper (~1830) & take 1 capsule (0.4 mg) by mouth at bedtime.    . vitamin E 400 UNIT capsule Take 400 Units by mouth daily.     No current facility-administered medications for this encounter.     No Known Allergies  Social History   Socioeconomic History  . Marital status: Married    Spouse name: Not on file  . Number of children: 1  . Years of education: Not on file  . Highest education level: Not on file  Occupational History  . Not on file  Social Needs  . Financial resource strain: Not on file  . Food insecurity:    Worry: Not on file    Inability: Not on file  . Transportation needs:    Medical: Not on  file    Non-medical: Not on file  Tobacco Use  . Smoking status: Never Smoker  . Smokeless tobacco: Never Used  Substance and Sexual Activity  . Alcohol use: No    Frequency: Never  . Drug use: No  . Sexual activity: Not on file  Lifestyle  . Physical activity:    Days per week: Not on file    Minutes per session: Not on file  . Stress: Not on file  Relationships  . Social connections:  Talks on phone: Not on file    Gets together: Not on file    Attends religious service: Not on file    Active member of club or organization: Not on file    Attends meetings of clubs or organizations: Not on file    Relationship status: Not on file  . Intimate partner violence:    Fear of current or ex partner: Not on file    Emotionally abused: Not on file    Physically abused: Not on file    Forced sexual activity: Not on file  Other Topics Concern  . Not on file  Social History Narrative  . Not on file    Family History  Problem Relation Age of Onset  . Heart failure Mother        also arthritis  . Stroke Father        also emphysema  . Heart failure Father     ROS- All systems are reviewed and negative except as per the HPI above  Physical Exam: Vitals:   01/25/18 1332  BP: 132/70  Pulse: 65  Weight: 120.6 kg  Height: 5\' 8"  (1.727 m)   Wt Readings from Last 3 Encounters:  01/25/18 120.6 kg  01/08/18 118.7 kg  12/22/17 120.7 kg    Labs: Lab Results  Component Value Date   NA 139 06/20/2017   K 4.2 06/20/2017   CL 104 06/20/2017   CO2 27 06/20/2017   GLUCOSE 90 06/20/2017   BUN 14 06/20/2017   CREATININE 0.83 06/20/2017   CALCIUM 8.6 (L) 06/20/2017   Lab Results  Component Value Date   INR 1.11 08/15/2011   No results found for: CHOL, HDL, LDLCALC, TRIG   GEN- The patient is well appearing, alert and oriented x 3 today.   Head- normocephalic, atraumatic Eyes-  Sclera clear, conjunctiva pink Ears- hearing intact Oropharynx- clear Neck- supple, no  JVP Lymph- no cervical lymphadenopathy Lungs- Clear to ausculation bilaterally, normal work of breathing Heart- irregular rate and rhythm, no murmurs, rubs or gallops, PMI not laterally displaced GI- soft, NT, ND, + BS Extremities- no clubbing, cyanosis, or edema MS- no significant deformity or atrophy Skin- no rash or lesion Psych- euthymic mood, full affect Neuro- strength and sensation are intact  EKG- afib at 81 bpm, qrs int 90 ms, qtc 427 ms Epic records reviewed Echo-Study Conclusions  - Left ventricle: The cavity size was normal. Wall thickness was   increased in a pattern of mild LVH. Systolic function was normal.   The estimated ejection fraction was in the range of 55% to 60%.   Wall motion was normal; there were no regional wall motion   abnormalities. - Aortic valve: There was mild regurgitation. Valve area (Vmax):   2.19 cm^2. - Mitral valve: Calcified annulus. There was mild regurgitation.  Impressions:  - Normal LV systolic function; mild LVH; calcified aortic valve   with mild AI; mild MR.   Assessment and Plan: 1. New onset afib Asymptomatic, but feels he would like to pursue cardioversion to see if Sr can be restored Risk vrs benefit discussed He is rate controlled, BB not needed Continue to use cpap Weight reduction and exercise encouraged Bmet/mag today  2.Chadsvasc score of 4 Doing well with the addition of apixaban 5 mg bid, no missed doses since starting drug No bleeding issues  F/u one week after cardioversion  Butch Penny C. Vida Nicol, [facility] [address] [place], [place] [address] [phone]

## 2018-01-25 NOTE — Addendum Note (Signed)
Encounter addended by: Iona Hansen, CMA on: 01/25/2018 2:24 PM  Actions taken: Pharmacy for encounter modified, Order list changed

## 2018-01-25 NOTE — Patient Instructions (Signed)
Your cardioversion is scheduled for : Monday January 29, 2018 at 1 pm Arrive at the [facility] and go to admitting at 11:30 a.m. Do Not eat or drink anything after midnight the night prior to your procedure. Take all your medications with a sip of water prior to arrival. Do NOT miss any doses of your blood thinner. You will NOT be able to drive home after your procedure.

## 2018-01-29 ENCOUNTER — Ambulatory Visit (HOSPITAL_COMMUNITY): Payer: Medicare Other | Admitting: Certified Registered Nurse Anesthetist

## 2018-01-29 ENCOUNTER — Encounter (HOSPITAL_COMMUNITY): Payer: Self-pay | Admitting: *Deleted

## 2018-01-29 ENCOUNTER — Ambulatory Visit (HOSPITAL_COMMUNITY)
Admission: RE | Admit: 2018-01-29 | Discharge: 2018-01-29 | Disposition: A | Discharge status: Home / Self Care | Payer: Medicare Other | Source: Ambulatory Visit | Attending: Cardiology | Admitting: Cardiology

## 2018-01-29 ENCOUNTER — Encounter (HOSPITAL_COMMUNITY)
Admission: RE | Disposition: A | Discharge status: Home / Self Care | Payer: Self-pay | Source: Ambulatory Visit | Attending: Cardiology

## 2018-01-29 ENCOUNTER — Other Ambulatory Visit: Payer: Self-pay

## 2018-01-29 DIAGNOSIS — M1990 Unspecified osteoarthritis, unspecified site: Secondary | ICD-10-CM | POA: Insufficient documentation

## 2018-01-29 DIAGNOSIS — I2510 Atherosclerotic heart disease of native coronary artery without angina pectoris: Secondary | ICD-10-CM | POA: Insufficient documentation

## 2018-01-29 DIAGNOSIS — Z79899 Other long term (current) drug therapy: Secondary | ICD-10-CM | POA: Insufficient documentation

## 2018-01-29 DIAGNOSIS — E785 Hyperlipidemia, unspecified: Secondary | ICD-10-CM | POA: Diagnosis not present

## 2018-01-29 DIAGNOSIS — Z85820 Personal history of malignant melanoma of skin: Secondary | ICD-10-CM | POA: Insufficient documentation

## 2018-01-29 DIAGNOSIS — Z823 Family history of stroke: Secondary | ICD-10-CM | POA: Insufficient documentation

## 2018-01-29 DIAGNOSIS — F419 Anxiety disorder, unspecified: Secondary | ICD-10-CM | POA: Diagnosis not present

## 2018-01-29 DIAGNOSIS — Z7901 Long term (current) use of anticoagulants: Secondary | ICD-10-CM | POA: Insufficient documentation

## 2018-01-29 DIAGNOSIS — I4891 Unspecified atrial fibrillation: Secondary | ICD-10-CM | POA: Diagnosis not present

## 2018-01-29 DIAGNOSIS — Z9889 Other specified postprocedural states: Secondary | ICD-10-CM | POA: Diagnosis not present

## 2018-01-29 DIAGNOSIS — Z96651 Presence of right artificial knee joint: Secondary | ICD-10-CM | POA: Diagnosis not present

## 2018-01-29 DIAGNOSIS — G4733 Obstructive sleep apnea (adult) (pediatric): Secondary | ICD-10-CM | POA: Insufficient documentation

## 2018-01-29 DIAGNOSIS — Z87442 Personal history of urinary calculi: Secondary | ICD-10-CM | POA: Insufficient documentation

## 2018-01-29 DIAGNOSIS — Z8249 Family history of ischemic heart disease and other diseases of the circulatory system: Secondary | ICD-10-CM | POA: Insufficient documentation

## 2018-01-29 DIAGNOSIS — F329 Major depressive disorder, single episode, unspecified: Secondary | ICD-10-CM | POA: Insufficient documentation

## 2018-01-29 DIAGNOSIS — E669 Obesity, unspecified: Secondary | ICD-10-CM | POA: Insufficient documentation

## 2018-01-29 DIAGNOSIS — I10 Essential (primary) hypertension: Secondary | ICD-10-CM | POA: Diagnosis not present

## 2018-01-29 HISTORY — PX: CARDIOVERSION: SHX1299

## 2018-01-29 SURGERY — CARDIOVERSION
Anesthesia: General

## 2018-01-29 MED ORDER — SODIUM CHLORIDE 0.9 % IV SOLN
INTRAVENOUS | Status: AC | PRN
  Administered 2018-01-29: 500 mL via INTRAVENOUS

## 2018-01-29 MED ORDER — APIXABAN 5 MG PO TABS
5.0000 mg | ORAL_TABLET | Freq: Once | ORAL | Status: AC
  Administered 2018-01-29: 5 mg via ORAL
  Filled 2018-01-29: qty 1

## 2018-01-29 MED ORDER — PROPOFOL 10 MG/ML IV BOLUS
INTRAVENOUS | Status: DC | PRN
  Administered 2018-01-29: 70 mg via INTRAVENOUS

## 2018-01-29 MED ORDER — LIDOCAINE 2% (20 MG/ML) 5 ML SYRINGE
INTRAMUSCULAR | Status: DC | PRN
  Administered 2018-01-29: 50 mg via INTRAVENOUS

## 2018-01-29 NOTE — Transfer of Care (Signed)
Immediate Anesthesia Transfer of Care Note  Patient: Paul Lam  Procedure(s) Performed: CARDIOVERSION (N/A )  Patient Location: Endoscopy Unit  Anesthesia Type:General  Level of Consciousness: awake, alert  and oriented  Airway & Oxygen Therapy: Patient Spontanous Breathing and Patient connected to face mask oxygen  Post-op Assessment: Report given to RN and Post -op Vital signs reviewed and stable  Post vital signs: Reviewed and stable  Last Vitals:  Vitals Value Taken Time  BP    Temp    Pulse    Resp    SpO2      Last Pain:  Vitals:   01/29/18 1114  TempSrc: Oral  PainSc: 0-No pain         Complications: No apparent anesthesia complications

## 2018-01-29 NOTE — Interval H&P Note (Signed)
History and Physical Interval Note:  01/29/2018 11:05 AM  Paul Lam  has presented today for surgery, with the diagnosis of AFIB  The various methods of treatment have been discussed with the patient and family. After consideration of risks, benefits and other options for treatment, the patient has consented to  Procedure(s): CARDIOVERSION (N/A) as a surgical intervention .  The patient's history has been reviewed, patient examined, no change in status, stable for surgery.  I have reviewed the patient's chart and labs.  Questions were answered to the patient's satisfaction.     [name]

## 2018-01-29 NOTE — CV Procedure (Signed)
    Cardioversion Note  Paul Lam [id number] 03/05/1941  Procedure: DC Cardioversion Indications: atrial fibrillation  Procedure Details Consent: Obtained Time Out: Verified patient identification, verified procedure, site/side was marked, verified correct patient position, special equipment/implants available, Radiology Safety Procedures followed,  medications/allergies/relevent history reviewed, required imaging and test results available.  Performed  The patient has been on adequate anticoagulation.  The patient received IV propofol for sedation.  Synchronous cardioversion was performed at 120 joules.  The cardioversion was successful.   Complications: No apparent complications Patient did tolerate procedure well.   [name], MD, [facility] 01/29/2018, 1:03 PM

## 2018-01-29 NOTE — Discharge Instructions (Signed)
Electrical Cardioversion, Care After °This sheet gives you information about how to care for yourself after your procedure. Your health care provider may also give you more specific instructions. If you have problems or questions, contact your health care provider. °What can I expect after the procedure? °After the procedure, it is common to have: °· Some redness on the skin where the shocks were given. ° °Follow these instructions at home: °· Do not drive for 24 hours if you were given a medicine to help you relax (sedative). °· Take over-the-counter and prescription medicines only as told by your health care provider. °· Ask your health care provider how to check your pulse. Check it often. °· Rest for 48 hours after the procedure or as told by your health care provider. °· Avoid or limit your caffeine use as told by your health care provider. °Contact a health care provider if: °· You feel like your heart is beating too quickly or your pulse is not regular. °· You have a serious muscle cramp that does not go away. °Get help right away if: °· You have discomfort in your chest. °· You are dizzy or you feel faint. °· You have trouble breathing or you are short of breath. °· Your speech is slurred. °· You have trouble moving an arm or leg on one side of your body. °· Your fingers or toes turn cold or blue. °This information is not intended to replace advice given to you by your health care provider. Make sure you discuss any questions you have with your health care provider. °Document Released: 03/13/2013 Document Revised: 12/25/2015 Document Reviewed: 11/27/2015 °Elsevier Interactive Patient Education © 2018 [employer] ° °

## 2018-01-29 NOTE — Anesthesia Preprocedure Evaluation (Signed)
Anesthesia Evaluation  Patient identified by MRN, date of birth, ID band Patient awake    Reviewed: Allergy & Precautions, NPO status , Patient's Chart, lab work & pertinent test results  Airway Mallampati: III  TM Distance: >3 FB Neck ROM: Full    Dental  (+) Teeth Intact   Pulmonary    breath sounds clear to auscultation + decreased breath sounds      Cardiovascular hypertension,  Rhythm:Irregular Rate:Normal     Neuro/Psych    GI/Hepatic   Endo/Other    Renal/GU      Musculoskeletal   Abdominal (+) + obese,   Peds  Hematology   Anesthesia Other Findings   Reproductive/Obstetrics                             Anesthesia Physical Anesthesia Plan  ASA: III  Anesthesia Plan: General   Post-op Pain Management:    Induction: Intravenous  PONV Risk Score and Plan:   Airway Management Planned: Mask  Additional Equipment:   Intra-op Plan:   Post-operative Plan:   Informed Consent: I have reviewed the patients History and Physical, chart, labs and discussed the procedure including the risks, benefits and alternatives for the proposed anesthesia with the patient or authorized representative who has indicated his/her understanding and acceptance.     Plan Discussed with: CRNA and Anesthesiologist  Anesthesia Plan Comments:         Anesthesia Quick Evaluation

## 2018-01-29 NOTE — Anesthesia Postprocedure Evaluation (Signed)
Anesthesia Post Note  Patient: Paul Lam  Procedure(s) Performed: CARDIOVERSION (N/A )     Patient location during evaluation: Endoscopy Anesthesia Type: General Level of consciousness: awake and alert Pain management: pain level controlled Vital Signs Assessment: post-procedure vital signs reviewed and stable Respiratory status: spontaneous breathing, nonlabored ventilation, respiratory function stable and patient connected to nasal cannula oxygen Cardiovascular status: blood pressure returned to baseline and stable Postop Assessment: no apparent nausea or vomiting Anesthetic complications: no    Last Vitals:  Vitals:   01/29/18 1320 01/29/18 1325  BP: 126/63 127/75  Pulse: 73   Resp: 15 15  Temp:    SpO2: 98% 98%    Last Pain:  Vitals:   01/29/18 1325  TempSrc:   PainSc: 0-No pain                 [name]

## 2018-01-30 ENCOUNTER — Encounter (HOSPITAL_COMMUNITY): Payer: Self-pay | Admitting: Cardiology

## 2018-02-06 ENCOUNTER — Encounter (HOSPITAL_COMMUNITY): Payer: Self-pay | Admitting: Nurse Practitioner

## 2018-02-06 ENCOUNTER — Ambulatory Visit (HOSPITAL_COMMUNITY)
Admission: RE | Admit: 2018-02-06 | Discharge: 2018-02-06 | Disposition: A | Discharge status: Home / Self Care | Payer: Medicare Other | Source: Ambulatory Visit | Attending: Nurse Practitioner | Admitting: Nurse Practitioner

## 2018-02-06 VITALS — BP 142/74 | HR 63 | Ht 68.0 in | Wt 265.0 lb

## 2018-02-06 DIAGNOSIS — I4891 Unspecified atrial fibrillation: Secondary | ICD-10-CM | POA: Diagnosis not present

## 2018-02-06 DIAGNOSIS — Z79899 Other long term (current) drug therapy: Secondary | ICD-10-CM | POA: Insufficient documentation

## 2018-02-06 DIAGNOSIS — F419 Anxiety disorder, unspecified: Secondary | ICD-10-CM | POA: Insufficient documentation

## 2018-02-06 DIAGNOSIS — Z85828 Personal history of other malignant neoplasm of skin: Secondary | ICD-10-CM | POA: Insufficient documentation

## 2018-02-06 DIAGNOSIS — Z87442 Personal history of urinary calculi: Secondary | ICD-10-CM | POA: Diagnosis not present

## 2018-02-06 DIAGNOSIS — I2510 Atherosclerotic heart disease of native coronary artery without angina pectoris: Secondary | ICD-10-CM | POA: Diagnosis not present

## 2018-02-06 DIAGNOSIS — Z6830 Body mass index (BMI) 30.0-30.9, adult: Secondary | ICD-10-CM | POA: Insufficient documentation

## 2018-02-06 DIAGNOSIS — E785 Hyperlipidemia, unspecified: Secondary | ICD-10-CM | POA: Diagnosis not present

## 2018-02-06 DIAGNOSIS — I481 Persistent atrial fibrillation: Secondary | ICD-10-CM | POA: Diagnosis not present

## 2018-02-06 DIAGNOSIS — I10 Essential (primary) hypertension: Secondary | ICD-10-CM | POA: Insufficient documentation

## 2018-02-06 DIAGNOSIS — E669 Obesity, unspecified: Secondary | ICD-10-CM | POA: Insufficient documentation

## 2018-02-06 DIAGNOSIS — G4733 Obstructive sleep apnea (adult) (pediatric): Secondary | ICD-10-CM | POA: Diagnosis not present

## 2018-02-06 DIAGNOSIS — Z96651 Presence of right artificial knee joint: Secondary | ICD-10-CM | POA: Insufficient documentation

## 2018-02-06 DIAGNOSIS — I4819 Other persistent atrial fibrillation: Secondary | ICD-10-CM

## 2018-02-06 NOTE — Progress Notes (Signed)
Primary Care Physician: [name], MD Referring Physician: Dr. Benjamine Mola Cardiologist: Dr. Dionicia Abler Paul Lam is a 77 y.o. male with a h/o CAD, HTN, obesity,OSA, that is in the afib clinic for evaluation for new onset afib found at time of sinus surgery  12/19/17. He was rate controlled. Chadsvasc score of 4. He is unaware of any fatigue or shortness of breath, palpitations associated with afib. He was  still having some small amount of bleeding from his nostrils. It was unsure of when his afib started as he is asymptomatic and has not seen a MD in some time.  He did not drink alcohol, minimal caffeine, uses Cpap.He is overweight and sedentary.  He was started on Eliquis and is back today and not having any issues with the anticoagulation. We discussed cardioversion but he is asymptomatic and unsure if he wants to undergo cardioversion but wants to go on vacation first and will be back in 2 weeks. Echo done today and shows normal EF.  F/u in afib clinic, 8/21. He is now back from his vacation and thinks he will pursue cardioversion. He still says that he is not symptomatic but he may see some improvement in energy with return to SR. He has not has any interruption in anticoagulation since eliquis was started.He is well rate controlled and has not required any rate control meds.  F/u in afib clinic, 9/3, he feels much improved in SR, successful cardioversion 8/26.   Today, he denies symptoms of palpitations, chest pain, shortness of breath, orthopnea, PND, lower extremity edema, dizziness, presyncope, syncope, or neurologic sequela. The patient is tolerating medications without difficulties and is otherwise without complaint today.   Past Medical History:  Diagnosis Date  . Anginal pain ([place])   . Anxiety   . Arthritis    generalized arthritis   . CAD (coronary artery disease)   . Cancer ([place])    skin cancer removed left ear    . Complication of anesthesia   . Depression   . Headache    . History of echocardiogram 02/19/2009   EF 50-55%; LA mild-mod dilated;   . History of kidney stones   . History of nuclear stress test 02/19/2009   low risk, normal   . Hyperlipidemia   . Hypertension   . Obesity (BMI 30-39.9)   . OSA (obstructive sleep apnea)    severe with AHI 25/hr now on BiPAP  . PONV (postoperative nausea and vomiting)   . Retinal detachment    with proliferative vitreoretinopathy  . Shortness of breath    related to weight   . Sleep apnea   . Vertigo    Past Surgical History:  Procedure Laterality Date  . APPENDECTOMY    . CARDIAC CATHETERIZATION  07/13/2005   noncritical CAD  . CARDIOVERSION N/A 01/29/2018   Procedure: CARDIOVERSION;  Surgeon: Dorothy Spark, MD;  Location: [facility] ENDOSCOPY;  Service: Cardiovascular;  Laterality: N/A;  . EYE SURGERY    . FLEXIBLE SIGMOIDOSCOPY N/A 07/14/2015   Procedure: FLEXIBLE SIGMOIDOSCOPY;  Surgeon: Aviva Signs, MD;  [facility];  Service: Gastroenterology;  Laterality: N/A;  . JOINT REPLACEMENT    . NASAL SEPTOPLASTY W/ TURBINOPLASTY Bilateral 12/19/2017   Procedure: NASAL SEPTOPLASTY WITH TURBINATE REDUCTION;  Surgeon: [name], MD;  Location: [place];  Service: ENT;  Laterality: Bilateral;  . NASAL SINUS SURGERY    . PARS PLANA VITRECTOMY Left 06/20/2017   Procedure: PARS PLANA VITRECTOMY WITH 25  GAUGE, REPAIR OF COMPLEX TRACTION RETINAL DETACHMENT, MEMBRANE PEEEL;  Surgeon: [name], MD;  Location: [place];  Service: Ophthalmology;  Laterality: Left;  . REPAIR OF COMPLEX TRACTION RETINAL DETACHMENT Left 06/20/2017  . RETINAL DETACHMENT SURGERY Right 04/2011  . SCLERAL BUCKLE WITH POSSIBLE 25 GAUGE PARS PLANA VITRECTOMY Left 05/22/2017   Procedure: SCLERAL BUCKLE, LASER, GAS INJECTION LEFT EYE;  Surgeon: [name], MD;  Location: [place];  Service: Ophthalmology;  Laterality: Left;  . SKIN CANCER EXCISION Left 2013   left ear skin ca removed  2 weeks ago - not completeely  healed 08/15/11   . TONSILLECTOMY    . TOTAL KNEE ARTHROPLASTY  08/22/2011   Procedure: TOTAL KNEE ARTHROPLASTY;  Surgeon: Gearlean Alf, MD;  Location: [facility];  Service: Orthopedics;  Laterality: Right;    Current Outpatient Medications  Medication Sig Dispense Refill  . ALPRAZolam (XANAX) 0.5 MG tablet Take 0.5 mg at bedtime by mouth. Anxiety     . apixaban (ELIQUIS) 5 MG TABS tablet Take 1 tablet (5 mg total) by mouth 2 (two) times daily. 60 tablet 6  . Apoaequorin (PREVAGEN) 10 MG CAPS Take 1 capsule by mouth 3 (three) times a week.     . brimonidine (ALPHAGAN) 0.15 % ophthalmic solution Place 1 drop into the left eye 2 (two) times daily.  3  . diphenhydrAMINE (SLEEP AID, DIPHENHYDRAMINE,) 25 MG tablet Take 50 mg by mouth at bedtime.     . finasteride (PROSCAR) 5 MG tablet Take 5 mg daily with supper by mouth.   10  . furosemide (LASIX) 20 MG tablet Take 20 mg daily as needed by mouth (for fluid retention.).     Marland Kitchen lisinopril (PRINIVIL,ZESTRIL) 10 MG tablet Take 10 mg by mouth daily.     . Methylcellulose, Laxative, (CITRUCEL PO) Take 1 capsule by mouth daily.     . nabumetone (RELAFEN) 750 MG tablet Take 750 mg by mouth daily.     . NON FORMULARY at bedtime. VPAP    . potassium chloride (K-DUR) 10 MEQ tablet Take 10 mEq daily as needed by mouth (with the lasix for fluid retention.). Take with Lasix  0  . simvastatin (ZOCOR) 40 MG tablet Take 40 mg by mouth at bedtime.    . Tamsulosin HCl (FLOMAX) 0.4 MG CAPS Take 0.4 mg by mouth See admin instructions. Take 1 capsule (0.4 mg) by mouth daily with supper (~1830) & take 1 capsule (0.4 mg) by mouth at bedtime.     No current facility-administered medications for this encounter.     No Known Allergies  Social History   Socioeconomic History  . Marital status: Married    Spouse name: Not on file  . Number of children: 1  . Years of education: Not on file  . Highest education level: Not on file  Occupational History  . Not on file    Social Needs  . Financial resource strain: Not on file  . Food insecurity:    Worry: Not on file    Inability: Not on file  . Transportation needs:    Medical: Not on file    Non-medical: Not on file  Tobacco Use  . Smoking status: Never Smoker  . Smokeless tobacco: Never Used  Substance and Sexual Activity  . Alcohol use: No    Frequency: Never  . Drug use: No  . Sexual activity: Not on file  Lifestyle  . Physical activity:    Days per week: Not on  file    Minutes per session: Not on file  . Stress: Not on file  Relationships  . Social connections:    Talks on phone: Not on file    Gets together: Not on file    Attends religious service: Not on file    Active member of club or organization: Not on file    Attends meetings of clubs or organizations: Not on file    Relationship status: Not on file  . Intimate partner violence:    Fear of current or ex partner: Not on file    Emotionally abused: Not on file    Physically abused: Not on file    Forced sexual activity: Not on file  Other Topics Concern  . Not on file  Social History Narrative  . Not on file    Family History  Problem Relation Age of Onset  . Heart failure Mother        also arthritis  . Stroke Father        also emphysema  . Heart failure Father     ROS- All systems are reviewed and negative except as per the HPI above  Physical Exam: Vitals:   02/06/18 1546  BP: (!) 142/74  Pulse: 63  Weight: 120.2 kg  Height: 5\' 8"  (1.727 m)   Wt Readings from Last 3 Encounters:  02/06/18 120.2 kg  01/29/18 117.9 kg  01/25/18 120.6 kg    Labs: Lab Results  Component Value Date   NA 142 01/25/2018   K 4.1 01/25/2018   CL 105 01/25/2018   CO2 31 01/25/2018   GLUCOSE 99 01/25/2018   BUN 15 01/25/2018   CREATININE 0.80 01/25/2018   CALCIUM 8.9 01/25/2018   Lab Results  Component Value Date   INR 1.11 08/15/2011   No results found for: CHOL, HDL, LDLCALC, TRIG   GEN- The patient is well  appearing, alert and oriented x 3 today.   Head- normocephalic, atraumatic Eyes-  Sclera clear, conjunctiva pink Ears- hearing intact Oropharynx- clear Neck- supple, no JVP Lymph- no cervical lymphadenopathy Lungs- Clear to ausculation bilaterally, normal work of breathing Heart-regular rate and rhythm, no murmurs, rubs or gallops, PMI not laterally displaced GI- soft, NT, ND, + BS Extremities- no clubbing, cyanosis, or edema MS- no significant deformity or atrophy Skin- no rash or lesion Psych- euthymic mood, full affect Neuro- strength and sensation are intact  EKG- NSR at 63 bpm, pr int 154 ms, qrs int 90 ms, qtc 413 ms Epic records reviewed Echo-Study Conclusions  - Left ventricle: The cavity size was normal. Wall thickness was   increased in a pattern of mild LVH. Systolic function was normal.   The estimated ejection fraction was in the range of 55% to 60%.   Wall motion was normal; there were no regional wall motion   abnormalities. - Aortic valve: There was mild regurgitation. Valve area (Vmax):   2.19 cm^2. - Mitral valve: Calcified annulus. There was mild regurgitation.  Impressions:  - Normal LV systolic function; mild LVH; calcified aortic valve   with mild AI; mild MR.   Assessment and Plan: 1. New onset afib He felt he was asymptomatic but now sees improvement in SR after successful cardioversion Did not start BB as he was slow in  afib and has HR in low 60's today Continue to use cpap Weight reduction and exercise encouraged   2.Chadsvasc score of 4 Doing well with the addition of apixaban 5 mg bid, no missed  doses since starting drug No bleeding issues  F/u with Dr. Debara Pickett in 3 months  Geroge Baseman. Jessika Rothery, [facility] [address] [place], [place] [address] [phone]

## 2018-02-07 ENCOUNTER — Telehealth: Payer: Self-pay | Admitting: Internal Medicine

## 2018-02-07 NOTE — Telephone Encounter (Signed)
Called patient and LVM to call back to schedule 3 month followup with Dr. Debara Pickett.

## 2018-02-20 ENCOUNTER — Encounter (HOSPITAL_COMMUNITY): Payer: Self-pay | Admitting: Cardiology

## 2018-02-20 NOTE — Addendum Note (Signed)
Addendum  created 02/20/18 2342 by Roberts Gaudy, MD   Intraprocedure Event edited, Intraprocedure Staff edited

## 2018-02-21 DIAGNOSIS — L570 Actinic keratosis: Secondary | ICD-10-CM | POA: Diagnosis not present

## 2018-02-21 DIAGNOSIS — C44319 Basal cell carcinoma of skin of other parts of face: Secondary | ICD-10-CM | POA: Diagnosis not present

## 2018-02-26 DIAGNOSIS — Z6841 Body Mass Index (BMI) 40.0 and over, adult: Secondary | ICD-10-CM | POA: Diagnosis not present

## 2018-02-26 DIAGNOSIS — Z1389 Encounter for screening for other disorder: Secondary | ICD-10-CM | POA: Diagnosis not present

## 2018-02-26 DIAGNOSIS — I4891 Unspecified atrial fibrillation: Secondary | ICD-10-CM | POA: Diagnosis not present

## 2018-02-26 DIAGNOSIS — F419 Anxiety disorder, unspecified: Secondary | ICD-10-CM | POA: Diagnosis not present

## 2018-03-08 ENCOUNTER — Ambulatory Visit (INDEPENDENT_AMBULATORY_CARE_PROVIDER_SITE_OTHER): Payer: Medicare Other | Admitting: Internal Medicine

## 2018-03-08 ENCOUNTER — Encounter: Payer: Self-pay | Admitting: Internal Medicine

## 2018-03-08 VITALS — BP 155/68 | HR 76 | Ht 68.0 in | Wt 260.6 lb

## 2018-03-08 DIAGNOSIS — I480 Paroxysmal atrial fibrillation: Secondary | ICD-10-CM | POA: Diagnosis not present

## 2018-03-08 DIAGNOSIS — C44319 Basal cell carcinoma of skin of other parts of face: Secondary | ICD-10-CM | POA: Diagnosis not present

## 2018-03-08 DIAGNOSIS — I10 Essential (primary) hypertension: Secondary | ICD-10-CM | POA: Diagnosis not present

## 2018-03-08 DIAGNOSIS — G4733 Obstructive sleep apnea (adult) (pediatric): Secondary | ICD-10-CM

## 2018-03-08 DIAGNOSIS — L570 Actinic keratosis: Secondary | ICD-10-CM | POA: Diagnosis not present

## 2018-03-08 NOTE — Patient Instructions (Signed)
Your physician wants you to follow-up in: ONE YEAR with Dr. Hilty. You will receive a reminder letter in the mail two months in advance. If you don't receive a letter, please call our office to schedule the follow-up appointment.  

## 2018-03-08 NOTE — Progress Notes (Signed)
OFFICE NOTE  Chief Complaint:  No complaints  Primary Care Physician: [name], MD  HPI:  Paul Lam is a 77 year old gentleman with a history of coronary disease with an EF of about 50-55%, also hypertension, dyslipidemia and sleep apnea on CPAP. Recently his EF had been reported to be lower, however, it is actually fairly normal. Overall he has done very well. No chest pain, shortness of breath, palpitations, presyncope or syncopal symptoms. He did have other non-cardiac problems over the past year, including a detached retina, skin cancer with grafting to the left ear, as well as a total knee replacement. He tolerated these surgeries well without any evidence of MI. His only concern today is some positional dizziness. He says that he gets dizzy when he bends over too quickly and sits up. He also gets dizzy sometimes when turning his head to the side. It was thought this might be due to positional orthostasis and his Lasix was held recently but there has been no significant change in his symptoms. During his episodes he also feels like there is some spinning of the room which sounds like vertigo to me.  Paul Lam returns today he is doing fairly well. He has felt somewhat fatigued recently however and it is noted that he is bradycardic today. He still get some positional dizziness and had one episode of vertigo. He was seen at [place] and they thought that there was possibly some insufficiency to the brain may be vertebrobasilar insufficiency that was leading to his vestibular problems.  Some Paul Lam back in the office today. Overall he is doing fairly well. He continues to struggle with vertigo and positional dizziness, mainly when bending over. He has chronic lower extremity edema which is likely due to venous hypertension. I've offered venous Dopplers before but he seems to be asymptomatic with it and he is declined. He's followed for sleep apnea by Dr. Radford Pax and saw her  recently and seems to be fairly compliant with this regimen. He takes furosemide several times a week for swelling.  01/04/2016  Paul Lam returns today for follow-up. He denies any chest pain or worsening shortness of breath. Unfortunately weight is up somewhat. He is not exercising regularly. Blood pressure is well-controlled at 120/56. He had recent follow-up with his primary care provider reported his cholesterol is at goal as well. He is going to see his urologist in a few weeks. He is compliant with CPAP and generally feels that he sleeps well at night.  01/23/2017  Paul Lam was seen today in follow-up. Today he is very sad and is accompanied by his wife. Their grandson recently committed suicide. I was to commend him on his weight loss, but I suspect the weight loss is been due to poor appetite since his grieving. He's also had some depression is now on medications. Overall he denies any chest pain or worsening shortness of breath. His blood pressure actually looks much better today, probably attributed to the weight loss. EKG is sinus rhythm.  03/09/2018  Paul Lam is seen today in annual follow-up.  He seemed to be doing well although did break down when he recalled their grandson who committed suicide last year and told me that his granddaughter also had attempted suicide by overdose.  Recently he had new onset atrial fibrillation and underwent cardioversion.  He followed up with Roderic Palau in the A. fib clinic and is been on Eliquis.  He seems to be maintaining sinus rhythm.  Otherwise Paul Lam has had no new issues.  Weight still is an issue.  Blood pressure was little elevated today however did come down.  Labs from July showed total cholesterol 113, HDL 44, LDL 61 and triglycerides 41.  PMHx:  Past Medical History:  Diagnosis Date  . Anginal pain ([place])   . Anxiety   . Arthritis    generalized arthritis   . CAD (coronary artery disease)   . Cancer ([place])    skin cancer removed  left ear    . Complication of anesthesia   . Depression   . Headache   . History of echocardiogram 02/19/2009   EF 50-55%; LA mild-mod dilated;   . History of kidney stones   . History of nuclear stress test 02/19/2009   low risk, normal   . Hyperlipidemia   . Hypertension   . Obesity (BMI 30-39.9)   . OSA (obstructive sleep apnea)    severe with AHI 25/hr now on BiPAP  . PONV (postoperative nausea and vomiting)   . Retinal detachment    with proliferative vitreoretinopathy  . Shortness of breath    related to weight   . Sleep apnea   . Vertigo     Past Surgical History:  Procedure Laterality Date  . APPENDECTOMY    . CARDIAC CATHETERIZATION  07/13/2005   noncritical CAD  . CARDIOVERSION N/A 01/29/2018   Procedure: CARDIOVERSION;  Surgeon: Dorothy Spark, MD;  Location: [facility] ENDOSCOPY;  Service: Cardiovascular;  Laterality: N/A;  . EYE SURGERY    . FLEXIBLE SIGMOIDOSCOPY N/A 07/14/2015   Procedure: FLEXIBLE SIGMOIDOSCOPY;  Surgeon: Aviva Signs, MD;  [facility];  Service: Gastroenterology;  Laterality: N/A;  . JOINT REPLACEMENT    . NASAL SEPTOPLASTY W/ TURBINOPLASTY Bilateral 12/19/2017   Procedure: NASAL SEPTOPLASTY WITH TURBINATE REDUCTION;  Surgeon: [name], MD;  Location: [place];  Service: ENT;  Laterality: Bilateral;  . NASAL SINUS SURGERY    . PARS PLANA VITRECTOMY Left 06/20/2017   Procedure: PARS PLANA VITRECTOMY WITH 25 GAUGE, REPAIR OF COMPLEX TRACTION RETINAL DETACHMENT, MEMBRANE PEEEL;  Surgeon: [name], MD;  Location: [place];  Service: Ophthalmology;  Laterality: Left;  . REPAIR OF COMPLEX TRACTION RETINAL DETACHMENT Left 06/20/2017  . RETINAL DETACHMENT SURGERY Right 04/2011  . SCLERAL BUCKLE WITH POSSIBLE 25 GAUGE PARS PLANA VITRECTOMY Left 05/22/2017   Procedure: SCLERAL BUCKLE, LASER, GAS INJECTION LEFT EYE;  Surgeon: [name], MD;  Location: [place];  Service: Ophthalmology;  Laterality: Left;  . SKIN CANCER  EXCISION Left 2013   left ear skin ca removed  2 weeks ago - not completeely healed 08/15/11   . TONSILLECTOMY    . TOTAL KNEE ARTHROPLASTY  08/22/2011   Procedure: TOTAL KNEE ARTHROPLASTY;  Surgeon: Gearlean Alf, MD;  [facility];  Service: Orthopedics;  Laterality: Right;    FAMHx:  Family History  Problem Relation Age of Onset  . Heart failure Mother        also arthritis  . Stroke Father        also emphysema  . Heart failure Father     SOCHx:   reports that he has never smoked. He has never used smokeless tobacco. He reports that he does not drink alcohol or use drugs.  ALLERGIES:  No Known Allergies  ROS: Pertinent items noted in HPI and remainder of comprehensive ROS otherwise negative.  HOME MEDS: Current Outpatient Medications  Medication Sig Dispense Refill  . ALPRAZolam (  XANAX) 0.5 MG tablet Take 0.5 mg at bedtime by mouth. Anxiety     . apixaban (ELIQUIS) 5 MG TABS tablet Take 1 tablet (5 mg total) by mouth 2 (two) times daily. 60 tablet 6  . Apoaequorin (PREVAGEN) 10 MG CAPS Take 1 capsule by mouth 3 (three) times a week.     . brimonidine (ALPHAGAN) 0.15 % ophthalmic solution Place 1 drop into the left eye 2 (two) times daily.  3  . diphenhydrAMINE (SLEEP AID, DIPHENHYDRAMINE,) 25 MG tablet Take 50 mg by mouth at bedtime.     . finasteride (PROSCAR) 5 MG tablet Take 5 mg daily with supper by mouth.   10  . furosemide (LASIX) 20 MG tablet Take 20 mg daily as needed by mouth (for fluid retention.).     Marland Kitchen lisinopril (PRINIVIL,ZESTRIL) 10 MG tablet Take 10 mg by mouth daily.     . Methylcellulose, Laxative, (CITRUCEL PO) Take 1 capsule by mouth daily.     . nabumetone (RELAFEN) 750 MG tablet Take 750 mg by mouth daily.     . NON FORMULARY at bedtime. VPAP    . potassium chloride (K-DUR) 10 MEQ tablet Take 10 mEq daily as needed by mouth (with the lasix for fluid retention.). Take with Lasix  0  . simvastatin (ZOCOR) 40 MG tablet Take 40 mg by mouth at  bedtime.    . Tamsulosin HCl (FLOMAX) 0.4 MG CAPS Take 0.4 mg by mouth See admin instructions. Take 1 capsule (0.4 mg) by mouth daily with supper (~1830) & take 1 capsule (0.4 mg) by mouth at bedtime.     No current facility-administered medications for this visit.     LABS/IMAGING: No results found for this or any previous visit (from the past 48 hour(s)). No results found.  VITALS: BP (!) 155/68   Pulse 76   Ht 5\' 8"  (1.727 m)   Wt 260 lb 9.6 oz (118.2 kg)   BMI 39.62 kg/m   EXAM: General appearance: alert and no distress Neck: no carotid bruit, no JVD and thyroid not enlarged, symmetric, no tenderness/mass/nodules Lungs: clear to auscultation bilaterally Heart: regular rate and rhythm, S1, S2 normal, no murmur, click, rub or gallop Abdomen: soft, non-tender; bowel sounds normal; no masses,  no organomegaly Extremities: extremities normal, atraumatic, no cyanosis or edema Pulses: 2+ and symmetric Skin: Skin color, texture, turgor normal. No rashes or lesions Neurologic: GroPsych: Pleasanty normal Psych: Pleasant  EKG: Deferred  ASSESSMENT: 1. New onset atrial fibrillation-CHADSVASC score of 4, on Eliquis 2. Hypertension 3. Dyslipidemia 4. Obesity 5. Dizziness 6. Anxiety 7. OSA on CPAP 8. Chronic lower extremity edema secondary to venous hypertension  PLAN: 1.   Paul Lam continues to do well without any significant chest pain or worsening shortness of breath.  Blood pressure is somewhat elevated today.  He recently had new onset atrial fibrillation was seen by Roderic Palau.  He underwent a cardioversion and has been on Eliquis.  He has no bleeding issues on this and is maintaining sinus rhythm.  CHADSVASC score is 4.  No changes to medications today.  Follow-up with me annually or sooner as necessary.  Pixie Casino, MD, [facility], [place] Director of the Advanced Lipid Disorders &  Cardiovascular Risk Reduction Clinic Diplomate  of the American Board of Clinical Lipidology Attending Cardiologist  Direct Dial: [phone]  Fax: [phone]  Website:  [email or web address] 03/08/2018, 2:19 PM

## 2018-03-09 ENCOUNTER — Encounter: Payer: Self-pay | Admitting: Internal Medicine

## 2018-03-14 DIAGNOSIS — I739 Peripheral vascular disease, unspecified: Secondary | ICD-10-CM | POA: Diagnosis not present

## 2018-03-14 DIAGNOSIS — B351 Tinea unguium: Secondary | ICD-10-CM | POA: Diagnosis not present

## 2018-03-14 DIAGNOSIS — L110 Acquired keratosis follicularis: Secondary | ICD-10-CM | POA: Diagnosis not present

## 2018-04-11 ENCOUNTER — Other Ambulatory Visit (HOSPITAL_COMMUNITY)
Admission: RE | Admit: 2018-04-11 | Discharge: 2018-04-11 | Disposition: A | Discharge status: Home / Self Care | Payer: Medicare Other | Source: Ambulatory Visit | Attending: Urology | Admitting: Urology

## 2018-04-11 ENCOUNTER — Ambulatory Visit (INDEPENDENT_AMBULATORY_CARE_PROVIDER_SITE_OTHER): Payer: Medicare Other | Admitting: Urology

## 2018-04-11 DIAGNOSIS — R351 Nocturia: Secondary | ICD-10-CM

## 2018-04-11 DIAGNOSIS — N401 Enlarged prostate with lower urinary tract symptoms: Secondary | ICD-10-CM | POA: Diagnosis not present

## 2018-04-13 LAB — URINE CULTURE

## 2018-04-26 DIAGNOSIS — L600 Ingrowing nail: Secondary | ICD-10-CM | POA: Diagnosis not present

## 2018-04-26 DIAGNOSIS — M79675 Pain in left toe(s): Secondary | ICD-10-CM | POA: Diagnosis not present

## 2018-04-26 DIAGNOSIS — L03032 Cellulitis of left toe: Secondary | ICD-10-CM | POA: Diagnosis not present

## 2018-04-26 DIAGNOSIS — M79672 Pain in left foot: Secondary | ICD-10-CM | POA: Diagnosis not present

## 2018-05-09 ENCOUNTER — Encounter (INDEPENDENT_AMBULATORY_CARE_PROVIDER_SITE_OTHER): Payer: Medicare Other | Admitting: Ophthalmology

## 2018-05-09 DIAGNOSIS — H35372 Puckering of macula, left eye: Secondary | ICD-10-CM

## 2018-05-09 DIAGNOSIS — H43813 Vitreous degeneration, bilateral: Secondary | ICD-10-CM

## 2018-05-09 DIAGNOSIS — D3131 Benign neoplasm of right choroid: Secondary | ICD-10-CM | POA: Diagnosis not present

## 2018-05-09 DIAGNOSIS — H35033 Hypertensive retinopathy, bilateral: Secondary | ICD-10-CM | POA: Diagnosis not present

## 2018-05-09 DIAGNOSIS — I10 Essential (primary) hypertension: Secondary | ICD-10-CM

## 2018-05-09 DIAGNOSIS — H338 Other retinal detachments: Secondary | ICD-10-CM

## 2018-05-16 DIAGNOSIS — Z1389 Encounter for screening for other disorder: Secondary | ICD-10-CM | POA: Diagnosis not present

## 2018-05-16 DIAGNOSIS — G8929 Other chronic pain: Secondary | ICD-10-CM | POA: Diagnosis not present

## 2018-05-16 DIAGNOSIS — G4730 Sleep apnea, unspecified: Secondary | ICD-10-CM | POA: Diagnosis not present

## 2018-05-16 DIAGNOSIS — E782 Mixed hyperlipidemia: Secondary | ICD-10-CM | POA: Diagnosis not present

## 2018-05-16 DIAGNOSIS — I4891 Unspecified atrial fibrillation: Secondary | ICD-10-CM | POA: Diagnosis not present

## 2018-05-16 DIAGNOSIS — I10 Essential (primary) hypertension: Secondary | ICD-10-CM | POA: Diagnosis not present

## 2018-05-16 DIAGNOSIS — Z6841 Body Mass Index (BMI) 40.0 and over, adult: Secondary | ICD-10-CM | POA: Diagnosis not present

## 2018-05-23 ENCOUNTER — Ambulatory Visit (INDEPENDENT_AMBULATORY_CARE_PROVIDER_SITE_OTHER): Payer: Medicare Other | Admitting: Urology

## 2018-05-23 DIAGNOSIS — R351 Nocturia: Secondary | ICD-10-CM

## 2018-05-23 DIAGNOSIS — N401 Enlarged prostate with lower urinary tract symptoms: Secondary | ICD-10-CM | POA: Diagnosis not present

## 2018-05-24 DIAGNOSIS — L110 Acquired keratosis follicularis: Secondary | ICD-10-CM | POA: Diagnosis not present

## 2018-05-24 DIAGNOSIS — I739 Peripheral vascular disease, unspecified: Secondary | ICD-10-CM | POA: Diagnosis not present

## 2018-05-24 DIAGNOSIS — B351 Tinea unguium: Secondary | ICD-10-CM | POA: Diagnosis not present

## 2018-06-15 DIAGNOSIS — G8929 Other chronic pain: Secondary | ICD-10-CM | POA: Diagnosis not present

## 2018-06-15 DIAGNOSIS — Z1389 Encounter for screening for other disorder: Secondary | ICD-10-CM | POA: Diagnosis not present

## 2018-06-15 DIAGNOSIS — N400 Enlarged prostate without lower urinary tract symptoms: Secondary | ICD-10-CM | POA: Diagnosis not present

## 2018-06-15 DIAGNOSIS — E782 Mixed hyperlipidemia: Secondary | ICD-10-CM | POA: Diagnosis not present

## 2018-06-21 DIAGNOSIS — G4733 Obstructive sleep apnea (adult) (pediatric): Secondary | ICD-10-CM | POA: Diagnosis not present

## 2018-06-25 ENCOUNTER — Other Ambulatory Visit: Payer: Self-pay

## 2018-06-25 ENCOUNTER — Observation Stay (HOSPITAL_COMMUNITY)
Admission: EM | Admit: 2018-06-25 | Discharge: 2018-06-27 | Disposition: A | Discharge status: Home / Self Care | Payer: Medicare Other | Attending: Internal Medicine | Admitting: Internal Medicine

## 2018-06-25 ENCOUNTER — Emergency Department (HOSPITAL_COMMUNITY): Payer: Medicare Other

## 2018-06-25 ENCOUNTER — Encounter (HOSPITAL_COMMUNITY): Payer: Self-pay | Admitting: Emergency Medicine

## 2018-06-25 DIAGNOSIS — I2510 Atherosclerotic heart disease of native coronary artery without angina pectoris: Secondary | ICD-10-CM | POA: Diagnosis not present

## 2018-06-25 DIAGNOSIS — E785 Hyperlipidemia, unspecified: Secondary | ICD-10-CM | POA: Insufficient documentation

## 2018-06-25 DIAGNOSIS — Z7901 Long term (current) use of anticoagulants: Secondary | ICD-10-CM | POA: Insufficient documentation

## 2018-06-25 DIAGNOSIS — I10 Essential (primary) hypertension: Secondary | ICD-10-CM | POA: Diagnosis present

## 2018-06-25 DIAGNOSIS — R509 Fever, unspecified: Secondary | ICD-10-CM

## 2018-06-25 DIAGNOSIS — N400 Enlarged prostate without lower urinary tract symptoms: Secondary | ICD-10-CM | POA: Insufficient documentation

## 2018-06-25 DIAGNOSIS — Z6839 Body mass index (BMI) 39.0-39.9, adult: Secondary | ICD-10-CM | POA: Diagnosis not present

## 2018-06-25 DIAGNOSIS — Z85828 Personal history of other malignant neoplasm of skin: Secondary | ICD-10-CM | POA: Diagnosis not present

## 2018-06-25 DIAGNOSIS — N390 Urinary tract infection, site not specified: Secondary | ICD-10-CM

## 2018-06-25 DIAGNOSIS — D696 Thrombocytopenia, unspecified: Secondary | ICD-10-CM | POA: Diagnosis not present

## 2018-06-25 DIAGNOSIS — F419 Anxiety disorder, unspecified: Secondary | ICD-10-CM | POA: Diagnosis not present

## 2018-06-25 DIAGNOSIS — G4733 Obstructive sleep apnea (adult) (pediatric): Secondary | ICD-10-CM | POA: Diagnosis not present

## 2018-06-25 DIAGNOSIS — M1990 Unspecified osteoarthritis, unspecified site: Secondary | ICD-10-CM | POA: Diagnosis not present

## 2018-06-25 DIAGNOSIS — Z8249 Family history of ischemic heart disease and other diseases of the circulatory system: Secondary | ICD-10-CM | POA: Insufficient documentation

## 2018-06-25 DIAGNOSIS — Z87442 Personal history of urinary calculi: Secondary | ICD-10-CM | POA: Diagnosis not present

## 2018-06-25 DIAGNOSIS — Z79899 Other long term (current) drug therapy: Secondary | ICD-10-CM | POA: Diagnosis not present

## 2018-06-25 DIAGNOSIS — F329 Major depressive disorder, single episode, unspecified: Secondary | ICD-10-CM | POA: Insufficient documentation

## 2018-06-25 DIAGNOSIS — R251 Tremor, unspecified: Secondary | ICD-10-CM | POA: Insufficient documentation

## 2018-06-25 DIAGNOSIS — E669 Obesity, unspecified: Secondary | ICD-10-CM | POA: Diagnosis present

## 2018-06-25 DIAGNOSIS — R000 Tachycardia, unspecified: Secondary | ICD-10-CM | POA: Diagnosis not present

## 2018-06-25 DIAGNOSIS — I482 Chronic atrial fibrillation, unspecified: Principal | ICD-10-CM

## 2018-06-25 DIAGNOSIS — I4891 Unspecified atrial fibrillation: Secondary | ICD-10-CM | POA: Diagnosis present

## 2018-06-25 DIAGNOSIS — R609 Edema, unspecified: Secondary | ICD-10-CM | POA: Diagnosis not present

## 2018-06-25 DIAGNOSIS — R0689 Other abnormalities of breathing: Secondary | ICD-10-CM | POA: Diagnosis not present

## 2018-06-25 LAB — CBC WITH DIFFERENTIAL/PLATELET
Abs Immature Granulocytes: 0.02 10*3/uL (ref 0.00–0.07)
BASOS ABS: 0 10*3/uL (ref 0.0–0.1)
Basophils Relative: 0 %
EOS ABS: 0 10*3/uL (ref 0.0–0.5)
EOS PCT: 0 %
HEMATOCRIT: 46.1 % (ref 39.0–52.0)
HEMOGLOBIN: 14.2 g/dL (ref 13.0–17.0)
Immature Granulocytes: 0 %
LYMPHS ABS: 0.3 10*3/uL — AB (ref 0.7–4.0)
LYMPHS PCT: 5 %
MCH: 28.4 pg (ref 26.0–34.0)
MCHC: 30.8 g/dL (ref 30.0–36.0)
MCV: 92.2 fL (ref 80.0–100.0)
MONO ABS: 0 10*3/uL — AB (ref 0.1–1.0)
Monocytes Relative: 1 %
NRBC: 0 % (ref 0.0–0.2)
Neutro Abs: 5.9 10*3/uL (ref 1.7–7.7)
Neutrophils Relative %: 94 %
Platelets: 106 10*3/uL — ABNORMAL LOW (ref 150–400)
RBC: 5 MIL/uL (ref 4.22–5.81)
RDW: 13.9 % (ref 11.5–15.5)
WBC: 6.3 10*3/uL (ref 4.0–10.5)

## 2018-06-25 LAB — URINALYSIS, ROUTINE W REFLEX MICROSCOPIC
BILIRUBIN URINE: NEGATIVE
Glucose, UA: NEGATIVE mg/dL
Ketones, ur: NEGATIVE mg/dL
LEUKOCYTES UA: NEGATIVE
NITRITE: POSITIVE — AB
PH: 5 (ref 5.0–8.0)
Protein, ur: NEGATIVE mg/dL
SPECIFIC GRAVITY, URINE: 1.014 (ref 1.005–1.030)

## 2018-06-25 LAB — COMPREHENSIVE METABOLIC PANEL
ALBUMIN: 4.2 g/dL (ref 3.5–5.0)
ALK PHOS: 75 U/L (ref 38–126)
ALT: 8 U/L (ref 0–44)
ANION GAP: 11 (ref 5–15)
AST: 18 U/L (ref 15–41)
BILIRUBIN TOTAL: 1.5 mg/dL — AB (ref 0.3–1.2)
BUN: 19 mg/dL (ref 8–23)
CALCIUM: 8.7 mg/dL — AB (ref 8.9–10.3)
CO2: 24 mmol/L (ref 22–32)
Chloride: 104 mmol/L (ref 98–111)
Creatinine, Ser: 0.84 mg/dL (ref 0.61–1.24)
GLUCOSE: 93 mg/dL (ref 70–99)
POTASSIUM: 3.5 mmol/L (ref 3.5–5.1)
Sodium: 139 mmol/L (ref 135–145)
TOTAL PROTEIN: 7.4 g/dL (ref 6.5–8.1)

## 2018-06-25 LAB — TROPONIN I: Troponin I: <0.03 ng/mL (ref <0.03)

## 2018-06-25 LAB — BRAIN NATRIURETIC PEPTIDE: B NATRIURETIC PEPTIDE 5: 81 pg/mL (ref 0.0–100.0)

## 2018-06-25 MED ORDER — BRIMONIDINE TARTRATE 0.15 % OP SOLN
1.0000 [drp] | Freq: Two times a day (BID) | OPHTHALMIC | Status: DC
  Administered 2018-06-26 – 2018-06-27 (×2): 1 [drp] via OPHTHALMIC
  Filled 2018-06-25: qty 5

## 2018-06-25 MED ORDER — FINASTERIDE 5 MG PO TABS
5.0000 mg | ORAL_TABLET | Freq: Every day | ORAL | Status: DC
  Administered 2018-06-25 – 2018-06-26 (×2): 5 mg via ORAL
  Filled 2018-06-25 (×7): qty 1

## 2018-06-25 MED ORDER — ACETAMINOPHEN 325 MG PO TABS
650.0000 mg | ORAL_TABLET | ORAL | Status: DC | PRN
  Administered 2018-06-25: 650 mg via ORAL
  Filled 2018-06-25: qty 2

## 2018-06-25 MED ORDER — METOPROLOL TARTRATE 25 MG PO TABS
25.0000 mg | ORAL_TABLET | Freq: Two times a day (BID) | ORAL | Status: DC
  Administered 2018-06-25 – 2018-06-27 (×4): 25 mg via ORAL
  Filled 2018-06-25 (×4): qty 1

## 2018-06-25 MED ORDER — APIXABAN 5 MG PO TABS
5.0000 mg | ORAL_TABLET | Freq: Two times a day (BID) | ORAL | Status: DC
  Administered 2018-06-25 – 2018-06-27 (×4): 5 mg via ORAL
  Filled 2018-06-25 (×4): qty 1

## 2018-06-25 MED ORDER — DILTIAZEM HCL 100 MG IV SOLR
5.0000 mg/h | INTRAVENOUS | Status: DC
  Administered 2018-06-25: 5 mg/h via INTRAVENOUS
  Filled 2018-06-25: qty 100

## 2018-06-25 MED ORDER — NABUMETONE 500 MG PO TABS
750.0000 mg | ORAL_TABLET | Freq: Every day | ORAL | Status: DC
  Administered 2018-06-26 – 2018-06-27 (×2): 750 mg via ORAL
  Filled 2018-06-25: qty 2
  Filled 2018-06-25 (×2): qty 1
  Filled 2018-06-25 (×2): qty 2
  Filled 2018-06-25: qty 1

## 2018-06-25 MED ORDER — SIMVASTATIN 20 MG PO TABS
40.0000 mg | ORAL_TABLET | Freq: Every day | ORAL | Status: DC
  Administered 2018-06-25 – 2018-06-26 (×2): 40 mg via ORAL
  Filled 2018-06-25 (×2): qty 2

## 2018-06-25 MED ORDER — ALPRAZOLAM 0.5 MG PO TABS
0.5000 mg | ORAL_TABLET | Freq: Every day | ORAL | Status: DC
  Administered 2018-06-25 – 2018-06-26 (×2): 0.5 mg via ORAL
  Filled 2018-06-25 (×2): qty 1

## 2018-06-25 MED ORDER — LORAZEPAM 2 MG/ML IJ SOLN
1.0000 mg | Freq: Once | INTRAMUSCULAR | Status: AC
  Administered 2018-06-25: 1 mg via INTRAVENOUS
  Filled 2018-06-25: qty 1

## 2018-06-25 MED ORDER — SODIUM CHLORIDE 0.9 % IV SOLN
INTRAVENOUS | Status: DC
  Administered 2018-06-25: 08:00:00 via INTRAVENOUS

## 2018-06-25 MED ORDER — TAMSULOSIN HCL 0.4 MG PO CAPS
0.4000 mg | ORAL_CAPSULE | ORAL | Status: DC
  Administered 2018-06-25 – 2018-06-26 (×3): 0.4 mg via ORAL
  Filled 2018-06-25 (×3): qty 1

## 2018-06-25 MED ORDER — DILTIAZEM HCL 25 MG/5ML IV SOLN
5.0000 mg | Freq: Once | INTRAVENOUS | Status: AC
  Administered 2018-06-25: 5 mg via INTRAVENOUS
  Filled 2018-06-25: qty 5

## 2018-06-25 MED ORDER — ONDANSETRON HCL 4 MG/2ML IJ SOLN: 4.0000 mg | Freq: Four times a day (QID) | INTRAMUSCULAR | Status: DC | PRN

## 2018-06-25 MED ORDER — METOPROLOL TARTRATE 5 MG/5ML IV SOLN
2.5000 mg | INTRAVENOUS | Status: AC
  Filled 2018-06-25: qty 5

## 2018-06-25 NOTE — Care Management (Signed)
Pt and wife verbalize difficulty affording eliquis since changing insurances in 2020. Unsure of deductible. Benefits check requested and pending.

## 2018-06-25 NOTE — ED Triage Notes (Signed)
EMS called out for tremors.  180/100 Afib RVR 104-130.CBG 109.  Pt able to walk to bath without assistant in ED.

## 2018-06-25 NOTE — ED Notes (Signed)
Pt disclosed he has been having a very rough time for the past year since his daughter attempted suicide and he found her.  She was resuscitated but she had attempted suicide because her son killed himself prior to that.  The patient states he was very close to his grandson and has been thinking about this daily.  He has no thoughts of suicide and has recently connected with his greatgrandson who is 77 years old.  Pt was very tearful disclosing this and thanked me for listening as he does not have anyone to express his feelings to.

## 2018-06-25 NOTE — ED Provider Notes (Signed)
[facility] EMERGENCY DEPARTMENT Provider Note   CSN: [id number] Arrival date & time: 06/25/18  8756     History   Chief Complaint Chief Complaint  Patient presents with  . Tremors    HPI Paul Lam is a 78 y.o. male.  Patient brought in by EMS.  For tremors.  EMS also noted that he had an irregular heart rhythm with a rate anywhere from 100-1 40.  Blood sugar was 109.  He also relayed when he arrived here that he has been very tearful and has been distraught over the attempted suicide past year of his daughter when he found her.  And then also that his grandson killed himself.  Patient is not suicidal not homicidal.  Patient denies any pain chest pain or shortness of breath.  Patient has a past history of atrial fibrillation.  He is on the blood thinner Eliquis.  He is also on Xanax for anxiety.  He is on Lasix.  He is on blood pressure medicine lisinopril.  He is not on any medication to slow his heart rate down.  He is followed by cardiology Dr. Debara Pickett.     Past Medical History:  Diagnosis Date  . Anginal pain ([place])   . Anxiety   . Arthritis    generalized arthritis   . CAD (coronary artery disease)   . Cancer ([place])    skin cancer removed left ear    . Complication of anesthesia   . Depression   . Headache   . History of echocardiogram 02/19/2009   EF 50-55%; LA mild-mod dilated;   . History of kidney stones   . History of nuclear stress test 02/19/2009   low risk, normal   . Hyperlipidemia   . Hypertension   . Obesity (BMI 30-39.9)   . OSA (obstructive sleep apnea)    severe with AHI 25/hr now on BiPAP  . PONV (postoperative nausea and vomiting)   . Retinal detachment    with proliferative vitreoretinopathy  . Shortness of breath    related to weight   . Sleep apnea   . Vertigo     Patient Active Problem List   Diagnosis Date Noted  . S/P nasal septoplasty 12/19/2017  . Rhegmatogenous retinal detachment of left eye 05/22/2017  . Vertigo 01/02/2015  .  Obesity (BMI 30-39.9)   . Symptomatic bradycardia 01/02/2014  . OSA (obstructive sleep apnea)   . Hypertension   . Anxiety 12/27/2012  . Morbid obesity due to excess calories ([place]) 12/27/2012  . Dizziness 12/27/2012  . Stiffness of knee joint, right 10/11/2011  . Difficulty in walking(719.7) 10/11/2011  . Pain due to total knee replacement ([place]) 10/11/2011  . Postop Hyponatremia 08/23/2011  . OA (osteoarthritis) of knee 08/22/2011    Past Surgical History:  Procedure Laterality Date  . APPENDECTOMY    . CARDIAC CATHETERIZATION  07/13/2005   noncritical CAD  . CARDIOVERSION N/A 01/29/2018   Procedure: CARDIOVERSION;  Surgeon: Dorothy Spark, MD;  [facility] ENDOSCOPY;  Service: Cardiovascular;  Laterality: N/A;  . EYE SURGERY    . FLEXIBLE SIGMOIDOSCOPY N/A 07/14/2015   Procedure: FLEXIBLE SIGMOIDOSCOPY;  Surgeon: Aviva Signs, MD;  [facility];  Service: Gastroenterology;  Laterality: N/A;  . JOINT REPLACEMENT    . NASAL SEPTOPLASTY W/ TURBINOPLASTY Bilateral 12/19/2017   Procedure: NASAL SEPTOPLASTY WITH TURBINATE REDUCTION;  Surgeon: [name], MD;  Location: [place];  Service: ENT;  Laterality: Bilateral;  . NASAL SINUS SURGERY    .  PARS PLANA VITRECTOMY Left 06/20/2017   Procedure: PARS PLANA VITRECTOMY WITH 25 GAUGE, REPAIR OF COMPLEX TRACTION RETINAL DETACHMENT, MEMBRANE PEEEL;  Surgeon: [name], MD;  Location: [place];  Service: Ophthalmology;  Laterality: Left;  . REPAIR OF COMPLEX TRACTION RETINAL DETACHMENT Left 06/20/2017  . RETINAL DETACHMENT SURGERY Right 04/2011  . SCLERAL BUCKLE WITH POSSIBLE 25 GAUGE PARS PLANA VITRECTOMY Left 05/22/2017   Procedure: SCLERAL BUCKLE, LASER, GAS INJECTION LEFT EYE;  Surgeon: [name], MD;  Location: [place];  Service: Ophthalmology;  Laterality: Left;  . SKIN CANCER EXCISION Left 2013   left ear skin ca removed  2 weeks ago - not completeely healed 08/15/11   . TONSILLECTOMY    . TOTAL KNEE  ARTHROPLASTY  08/22/2011   Procedure: TOTAL KNEE ARTHROPLASTY;  Surgeon: Gearlean Alf, MD;  [facility]: [facility];  Service: Orthopedics;  Laterality: Right;        Home Medications    Prior to Admission medications   Medication Sig Start Date End Date Taking? Authorizing Provider  ALPRAZolam Duanne Moron) 0.5 MG tablet Take 0.5 mg at bedtime by mouth. Anxiety    Yes [provider]  apixaban (ELIQUIS) 5 MG TABS tablet Take 1 tablet (5 mg total) by mouth 2 (two) times daily. 01/25/18  Yes Sherran Needs, NP  brimonidine (ALPHAGAN) 0.15 % ophthalmic solution Place 1 drop into the left eye 2 (two) times daily. 12/23/17  Yes [provider]  finasteride (PROSCAR) 5 MG tablet Take 5 mg daily with supper by mouth.  06/08/15  Yes [provider]  furosemide (LASIX) 20 MG tablet Take 20 mg daily as needed by mouth (for fluid retention.).    Yes [provider]  lisinopril (PRINIVIL,ZESTRIL) 10 MG tablet Take 10 mg by mouth daily.    Yes [provider]  Methylcellulose, Laxative, (CITRUCEL PO) Take 1 capsule by mouth daily.    Yes [provider]  nabumetone (RELAFEN) 750 MG tablet Take 750 mg by mouth daily.    Yes [provider]  potassium chloride (K-DUR) 10 MEQ tablet Take 10 mEq daily as needed by mouth (with the lasix for fluid retention.). Take with Lasix 12/31/14  Yes [provider]  simvastatin (ZOCOR) 40 MG tablet Take 40 mg by mouth at bedtime.   Yes [provider]  Tamsulosin HCl (FLOMAX) 0.4 MG CAPS Take 0.4 mg by mouth See admin instructions. Take 1 capsule (0.4 mg) by mouth daily with supper (~1830) & take 1 capsule (0.4 mg) by mouth at bedtime.   Yes [provider]  NON FORMULARY at bedtime. VPAP    [provider]    Family History Family History  Problem Relation Age of Onset  . Heart failure Mother        also arthritis  . Stroke Father        also emphysema  . Heart failure Father      Social History Social History   Tobacco Use  . Smoking status: Never Smoker  . Smokeless tobacco: Never Used  Substance Use Topics  . Alcohol use: No    Frequency: Never  . Drug use: No     Allergies   Patient has no known allergies.   Review of Systems Review of Systems  Constitutional: Negative for chills and fever.  HENT: Negative for rhinorrhea and sore throat.   Eyes: Negative for visual disturbance.  Respiratory: Negative for cough and shortness of breath.   Cardiovascular: Negative for  chest pain and leg swelling.  Gastrointestinal: Negative for abdominal pain, diarrhea, nausea and vomiting.  Genitourinary: Negative for dysuria.  Musculoskeletal: Negative for back pain and neck pain.  Skin: Negative for rash.  Neurological: Positive for tremors. Negative for dizziness, light-headedness and headaches.  Hematological: Does not bruise/bleed easily.  Psychiatric/Behavioral: Negative for confusion. The patient is nervous/anxious.      Physical Exam Updated Vital Signs BP (!) 99/53   Pulse (!) 104   Resp (!) 24   Ht 1.727 m (5\' 8" )   Wt 117.9 kg   SpO2 93%   BMI 39.53 kg/m   Physical Exam Vitals signs and nursing note reviewed.  Constitutional:      Appearance: He is well-developed.  HENT:     Head: Normocephalic and atraumatic.     Mouth/Throat:     Mouth: Mucous membranes are moist.  Eyes:     Conjunctiva/sclera: Conjunctivae normal.  Neck:     Musculoskeletal: Neck supple.  Cardiovascular:     Rate and Rhythm: Tachycardia present. Rhythm irregular.     Heart sounds: No murmur.  Pulmonary:     Effort: Pulmonary effort is normal. No respiratory distress.     Breath sounds: Normal breath sounds.  Abdominal:     Palpations: Abdomen is soft.     Tenderness: There is no abdominal tenderness.  Musculoskeletal: Normal range of motion.     Comments: No significant leg swelling.  Skin:    General: Skin is warm and dry.     Capillary Refill:  Capillary refill takes less than 2 seconds.  Neurological:     General: No focal deficit present.     Mental Status: He is alert and oriented to person, place, and time.      ED Treatments / Results  Labs (all labs ordered are listed, but only abnormal results are displayed) Labs Reviewed  CBC WITH DIFFERENTIAL/PLATELET - Abnormal; Notable for the following components:      Result Value   Platelets 106 (*)    Lymphs Abs 0.3 (*)    Monocytes Absolute 0.0 (*)    All other components within normal limits  COMPREHENSIVE METABOLIC PANEL - Abnormal; Notable for the following components:   Calcium 8.7 (*)    Total Bilirubin 1.5 (*)    All other components within normal limits  BRAIN NATRIURETIC PEPTIDE  URINALYSIS, ROUTINE W REFLEX MICROSCOPIC    EKG EKG Interpretation  Date/Time:  Monday June 25 2018 07:43:24 EST Ventricular Rate:  117 PR Interval:    QRS Duration: 95 QT Interval:  317 QTC Calculation: 443 R Axis:   83 Text Interpretation:  Atrial fibrillation Borderline right axis deviation Abnormal R-wave progression, early transition Nonspecific repol abnormality, diffuse leads Baseline wander in lead(s) V1 Confirmed by Fredia Sorrow [phone]) on 06/25/2018 7:46:37 AM   Radiology Dg Chest 2 View  Result Date: 06/25/2018 CLINICAL DATA:  Elevated heart rate with tremors beginning this morning. EXAM: CHEST - 2 VIEW COMPARISON:  08/11/2016 FINDINGS: Lungs are adequately inflated without focal airspace consolidation or effusion. Slight prominence of the perihilar markings. Stable borderline cardiomegaly. Degenerative change of the spine. IMPRESSION: No acute cardiopulmonary disease. Electronically Signed   By: Marin Olp M.D.   On: 06/25/2018 08:33    Procedures Procedures (including critical care time)  CRITICAL CARE Performed by: Fredia Sorrow Total critical care time: 30 minutes Critical care time was exclusive of separately billable procedures and treating other  patients. Critical care was necessary to treat or prevent  imminent or life-threatening deterioration. Critical care was time spent personally by me on the following activities: development of treatment plan with patient and/or surrogate as well as nursing, discussions with consultants, evaluation of patient's response to treatment, examination of patient, obtaining history from patient or surrogate, ordering and performing treatments and interventions, ordering and review of laboratory studies, ordering and review of radiographic studies, pulse oximetry and re-evaluation of patient's condition.   Medications Ordered in ED Medications  0.9 %  sodium chloride infusion ( Intravenous New Bag/Given 06/25/18 0827)  diltiazem (CARDIZEM) 100 mg in dextrose 5 % 100 mL (1 mg/mL) infusion (7.5 mg/hr Intravenous Rate/Dose Change 06/25/18 1123)  LORazepam (ATIVAN) injection 1 mg (1 mg Intravenous Given 06/25/18 0829)  diltiazem (CARDIZEM) injection 5 mg (5 mg Intravenous Given 06/25/18 1013)     Initial Impression / Assessment and Plan / ED Course  I have reviewed the triage vital signs and the nursing notes.  Pertinent labs & imaging results that were available during my care of the patient were reviewed by me and considered in my medical decision making (see chart for details).   Patient had tremors mostly to his hands.  This completely resolved with some Ativan.  Patient had no complaints of chest pain or shortness of breath.  However cardiac monitor was consistent with atrial fibrillation with an elevated heart rate.  Anywhere from 100-1 40s.  After receiving the Ativan the heart rate came down more to the 100-1 20 range.  Patient received 5 mg of diltiazem.  The diltiazem brought heart rate down more some but it was only brief.  And then patient was back up into the 100 120 range.  Started on diltiazem drip.  Labs without significant abnormalities.  BNP not elevated.  Troponin not checked as patient had no  chest pain.  Gust with hospitalist who will see patient and admit.    Final Clinical Impressions(s) / ED Diagnoses   Final diagnoses:  Atrial fibrillation with RVR [facility])    ED Discharge Orders    None       Fredia Sorrow, MD 06/25/18 1323

## 2018-06-25 NOTE — Care Management Obs Status (Signed)
[facility] NOTIFICATION   Patient Details  Name: Paul Lam MRN: [id number] Date of Birth: [birth year]   Medicare Observation Status Notification Given:  Yes    Sherald Barge, RN 06/25/2018, 1:44 PM

## 2018-06-25 NOTE — H&P (Signed)
History and Physical    Paul Lam JSH:[id number] DOB: August 10, 1940 DOA: 06/25/2018  PCP: Paul Sites, MD  Patient coming from: Home  I have personally briefly reviewed patient's old medical records in [place]  Chief Complaint: Tremors  HPI: Paul Lam is a 78 y.o. male with medical history significant of chronic atrial fibrillation on anticoagulation, hypertension, hyperlipidemia, BPH, was in his usual state of health when he went to bed.  He woke up this morning with severe tremors.  He denies any chest pain, shortness of breath, palpitation, fever, cough, nausea, vomiting, diarrhea.  ED is severe tremors, he came to the ER for evaluation  ED Course: In the emergency room, he was noted to be tachycardic.  EKG showed rapid atrial fibrillation.  He was started on Cardizem infusion.  Basic labs including CBC chemistry was unrevealing.  Chest x-ray did not show any acute findings.  He received a dose of Ativan which significantly improved his tremors.  Review of Systems: As per HPI otherwise 10 point review of systems negative.    Past Medical History:  Diagnosis Date  . Anginal pain ([place])   . Anxiety   . Arthritis    generalized arthritis   . CAD (coronary artery disease)   . Cancer ([place])    skin cancer removed left ear    . Complication of anesthesia   . Depression   . Headache   . History of echocardiogram 02/19/2009   EF 50-55%; LA mild-mod dilated;   . History of kidney stones   . History of nuclear stress test 02/19/2009   low risk, normal   . Hyperlipidemia   . Hypertension   . Obesity (BMI 30-39.9)   . OSA (obstructive sleep apnea)    severe with AHI 25/hr now on BiPAP  . PONV (postoperative nausea and vomiting)   . Retinal detachment    with proliferative vitreoretinopathy  . Shortness of breath    related to weight   . Sleep apnea   . Vertigo     Past Surgical History:  Procedure Laterality Date  . APPENDECTOMY    . CARDIAC CATHETERIZATION   07/13/2005   noncritical CAD  . CARDIOVERSION N/A 01/29/2018   Procedure: CARDIOVERSION;  Surgeon: Paul Spark, MD;  Location: [facility] ENDOSCOPY;  Service: Cardiovascular;  Laterality: N/A;  . EYE SURGERY    . FLEXIBLE SIGMOIDOSCOPY N/A 07/14/2015   Procedure: FLEXIBLE SIGMOIDOSCOPY;  Surgeon: Paul Signs, MD;  [facility];  Service: Gastroenterology;  Laterality: N/A;  . JOINT REPLACEMENT    . NASAL SEPTOPLASTY W/ TURBINOPLASTY Bilateral 12/19/2017   Procedure: NASAL SEPTOPLASTY WITH TURBINATE REDUCTION;  Surgeon: Paul Baptist, MD;  Location: [place];  Service: ENT;  Laterality: Bilateral;  . NASAL SINUS SURGERY    . PARS PLANA VITRECTOMY Left 06/20/2017   Procedure: PARS PLANA VITRECTOMY WITH 25 GAUGE, REPAIR OF COMPLEX TRACTION RETINAL DETACHMENT, MEMBRANE PEEEL;  Surgeon: Paul Pedro, MD;  Location: [place];  Service: Ophthalmology;  Laterality: Left;  . REPAIR OF COMPLEX TRACTION RETINAL DETACHMENT Left 06/20/2017  . RETINAL DETACHMENT SURGERY Right 04/2011  . SCLERAL BUCKLE WITH POSSIBLE 25 GAUGE PARS PLANA VITRECTOMY Left 05/22/2017   Procedure: SCLERAL BUCKLE, LASER, GAS INJECTION LEFT EYE;  Surgeon: Paul Pedro, MD;  Location: [place];  Service: Ophthalmology;  Laterality: Left;  . SKIN CANCER EXCISION Left 2013   left ear skin ca removed  2 weeks ago - not completeely healed 08/15/11   .  TONSILLECTOMY    . TOTAL KNEE ARTHROPLASTY  08/22/2011   Procedure: TOTAL KNEE ARTHROPLASTY;  Surgeon: Paul Alf, MD;  [facility];  Service: Orthopedics;  Laterality: Right;    Social History:  reports that he has never smoked. He has never used smokeless tobacco. He reports that he does not drink alcohol or use drugs.  No Known Allergies  Family History  Problem Relation Age of Onset  . Heart failure Mother        also arthritis  . Stroke Father        also emphysema  . Heart failure Father      Prior to Admission medications   Medication Sig  Start Date End Date Taking? Authorizing Provider  ALPRAZolam Duanne Moron) 0.5 MG tablet Take 0.5 mg at bedtime by mouth. Anxiety    Yes [provider]  apixaban (ELIQUIS) 5 MG TABS tablet Take 1 tablet (5 mg total) by mouth 2 (two) times daily. 01/25/18  Yes Sherran Needs, NP  brimonidine (ALPHAGAN) 0.15 % ophthalmic solution Place 1 drop into the left eye 2 (two) times daily. 12/23/17  Yes [provider]  finasteride (PROSCAR) 5 MG tablet Take 5 mg daily with supper by mouth.  06/08/15  Yes [provider]  furosemide (LASIX) 20 MG tablet Take 20 mg daily as needed by mouth (for fluid retention.).    Yes [provider]  lisinopril (PRINIVIL,ZESTRIL) 10 MG tablet Take 10 mg by mouth daily.    Yes [provider]  Methylcellulose, Laxative, (CITRUCEL PO) Take 1 capsule by mouth daily.    Yes [provider]  nabumetone (RELAFEN) 750 MG tablet Take 750 mg by mouth daily.    Yes [provider]  potassium chloride (K-DUR) 10 MEQ tablet Take 10 mEq daily as needed by mouth (with the lasix for fluid retention.). Take with Lasix 12/31/14  Yes [provider]  simvastatin (ZOCOR) 40 MG tablet Take 40 mg by mouth at bedtime.   Yes [provider]  Tamsulosin HCl (FLOMAX) 0.4 MG CAPS Take 0.4 mg by mouth See admin instructions. Take 1 capsule (0.4 mg) by mouth daily with supper (~1830) & take 1 capsule (0.4 mg) by mouth at bedtime.   Yes [provider]  NON FORMULARY at bedtime. VPAP    [provider]    Physical Exam: Vitals:   06/25/18 1630 06/25/18 1645 06/25/18 1715 06/25/18 1730  BP: (!) 137/53 (!) 141/75 (!) 138/53 (!) 126/55  Pulse: 86 99 92 82  Resp: (!) 27 20 (!) 26 (!) 21  Temp:  99.6 F (37.6 C)    TempSrc:  Oral    SpO2: 99% 98% 98% 99%  Weight:      Height:        Constitutional: NAD, calm, comfortable Eyes: PERRL, lids and conjunctivae normal ENMT: Mucous membranes are moist.  Posterior pharynx clear of any exudate or lesions.Normal dentition.  Neck: normal, supple, no masses, no thyromegaly Respiratory: clear to auscultation bilaterally, no wheezing, no crackles. Normal respiratory effort. No accessory muscle use.  Cardiovascular: Irregular rate and rhythm, no murmurs / rubs / gallops. No extremity edema. 2+ pedal pulses. No carotid bruits.  Abdomen: no tenderness, no masses palpated. No hepatosplenomegaly. Bowel sounds positive.  Musculoskeletal: no clubbing / cyanosis. No joint deformity upper and lower extremities. Good ROM, no contractures. Normal muscle tone.  Skin: no rashes, lesions, ulcers. No induration Neurologic: CN 2-12 grossly intact. Sensation intact, DTR normal. Strength 5/5  in all 4.  Psychiatric: Normal judgment and insight. Alert and oriented x 3. Normal mood.    Labs on Admission: I have personally reviewed following labs and imaging studies  CBC: Recent Labs  Lab 06/25/18 0745  WBC 6.3  NEUTROABS 5.9  HGB 14.2  HCT 46.1  MCV 92.2  PLT 643*   Basic Metabolic Panel: Recent Labs  Lab 06/25/18 0745  NA 139  K 3.5  CL 104  CO2 24  GLUCOSE 93  BUN 19  CREATININE 0.84  CALCIUM 8.7*   GFR: Estimated Creatinine Clearance: 105.2 mL/min (by C-G formula based on SCr of 0.84 mg/dL). Liver Function Tests: Recent Labs  Lab 06/25/18 0745  AST 18  ALT 8  ALKPHOS 75  BILITOT 1.5*  PROT 7.4  ALBUMIN 4.2   No results for input(s): LIPASE, AMYLASE in the last 168 hours. No results for input(s): AMMONIA in the last 168 hours. Coagulation Profile: No results for input(s): INR, PROTIME in the last 168 hours. Cardiac Enzymes: No results for input(s): CKTOTAL, CKMB, CKMBINDEX, TROPONINI in the last 168 hours. BNP (last 3 results) No results for input(s): PROBNP in the last 8760 hours. HbA1C: No results for input(s): HGBA1C in the last 72 hours. CBG: No results for input(s): GLUCAP in the last 168 hours. Lipid Profile: No results  for input(s): CHOL, HDL, LDLCALC, TRIG, CHOLHDL, LDLDIRECT in the last 72 hours. Thyroid Function Tests: No results for input(s): TSH, T4TOTAL, FREET4, T3FREE, THYROIDAB in the last 72 hours. Anemia Panel: No results for input(s): VITAMINB12, FOLATE, FERRITIN, TIBC, IRON, RETICCTPCT in the last 72 hours. Urine analysis:    Component Value Date/Time   COLORURINE YELLOW 06/25/2018 0948   APPEARANCEUR CLEAR 06/25/2018 0948   LABSPEC 1.014 06/25/2018 0948   PHURINE 5.0 06/25/2018 0948   GLUCOSEU NEGATIVE 06/25/2018 0948   HGBUR SMALL (A) 06/25/2018 0948   BILIRUBINUR NEGATIVE 06/25/2018 0948   KETONESUR NEGATIVE 06/25/2018 0948   PROTEINUR NEGATIVE 06/25/2018 0948   UROBILINOGEN 0.2 08/15/2011 1248   NITRITE POSITIVE (A) 06/25/2018 0948   LEUKOCYTESUR NEGATIVE 06/25/2018 0948    Radiological Exams on Admission: Dg Chest 2 View  Result Date: 06/25/2018 CLINICAL DATA:  Elevated heart rate with tremors beginning this morning. EXAM: CHEST - 2 VIEW COMPARISON:  08/11/2016 FINDINGS: Lungs are adequately inflated without focal airspace consolidation or effusion. Slight prominence of the perihilar markings. Stable borderline cardiomegaly. Degenerative change of the spine. IMPRESSION: No acute cardiopulmonary disease. Electronically Signed   By: Marin Olp M.D.   On: 06/25/2018 08:33    EKG: Independently reviewed.  A. fib with RVR  Assessment/Plan Principal Problem:   Atrial fibrillation with RVR (HCC) Active Problems:   Anxiety   Morbid obesity due to excess calories (HCC)   Hypertension     1. Chronic atrial fibrillation with rapid ventricular response.  Currently on Cardizem infusion.  Heart rate is substantially better in the 70s to 80s at this time.  Will discontinue further Cardizem and start on low-dose beta-blockers.  Interestingly, he is not on any chronic rate control therapy.  Continue on Eliquis./2019 showed ejection fraction of 55 to 60%.  He underwent cardioversion in  01/2018.  He was followed up in the atrial fibrillation clinic on 02/2018 and was noted to be in sinus rhythm.  Is unclear as to how long he has been in recurrent A. fib.  Will check cardiac markers. 2. Tremors, likely secondary to anxiety.  Improved after lorazepam.  Will continue on home dose of  Xanax. 3. Hypertension.  Holding lisinopril for now also has a lot better rate control. 4. Obesity. 5. Depression.  Patient describes significant depression since the suicide of his grandson a year ago and he attempted suicide by his daughter.  He denies any suicidal ideations himself.  He would likely benefit from outpatient counseling/psych follow-up.  DVT prophylaxis: Eliquis Code Status: Full code Family Communication: No family present Disposition Plan: Discharge home in a.m. if heart rate is stable Consults called:   Admission status: Observation, telemetry  Kathie Dike MD [facility]   If 7PM-7AM, please contact night-coverage [email or web address]   06/25/2018, 7:16 PM

## 2018-06-25 NOTE — ED Notes (Signed)
ED TO INPATIENT HANDOFF REPORT  Name/Age/Gender Paul Lam 78 y.o. male  Code Status Code Status History    Date Active Date Inactive Code Status Order ID Comments User Context   12/19/2017 1414 12/20/2017 1105 Full Code [id number]  [name], MD Inpatient   06/20/2017 1630 06/21/2017 1222 Full Code [id number]  [name], MD Inpatient   05/22/2017 2222 05/23/2017 1143 Full Code [id number]  [name], MD Inpatient   08/22/2011 1455 08/25/2011 1658 Full Code [id number]  Ben[place], Dahlia Byes, RN Inpatient    Advance Directive Documentation     Most Recent Value  Type of Advance Directive  Living will  Pre-existing out of facility DNR order (yellow form or pink MOST form)  -  "MOST" Form in Place?  -      Home/SNF/Other Home  Chief Complaint Tremors  Level of Care/Admitting Diagnosis ED Disposition    ED Disposition Condition [place]: [facility] [[id number]]  Level of Care: Stepdown [14]  Diagnosis: Atrial fibrillation with RVR [facility]) [937902]  Admitting Physician: Kathie Dike [[id number]]  Attending Physician: Kathie Dike [[id number]]  PT Class (Do Not Modify): Observation [104]  PT Acc Code (Do Not Modify): Observation [10022]       Medical History Past Medical History:  Diagnosis Date  . Anginal pain ([place])   . Anxiety   . Arthritis    generalized arthritis   . CAD (coronary artery disease)   . Cancer ([place])    skin cancer removed left ear    . Complication of anesthesia   . Depression   . Headache   . History of echocardiogram 02/19/2009   EF 50-55%; LA mild-mod dilated;   . History of kidney stones   . History of nuclear stress test 02/19/2009   low risk, normal   . Hyperlipidemia   . Hypertension   . Obesity (BMI 30-39.9)   . OSA (obstructive sleep apnea)    severe with AHI 25/hr now on BiPAP  . PONV (postoperative nausea and vomiting)   . Retinal detachment    with proliferative vitreoretinopathy  . Shortness of  breath    related to weight   . Sleep apnea   . Vertigo     Allergies No Known Allergies  IV Location/Drains/Wounds Patient Lines/Drains/Airways Status   Active Line/Drains/Airways    Name:   Placement date:   Placement time:   Site:   Days:   Peripheral IV 06/25/18 Right Hand   06/25/18    0747    Hand   less than 1   Pain Pump Right Knee   08/22/11    0850     2499   Incision 08/22/11 Leg Right   08/22/11    0806     2499   Incision (Closed) 05/22/17 Eye Left   05/22/17    2011     399   Incision (Closed) 06/20/17 Eye Left   06/20/17    1407     370   Incision (Closed) 12/19/17 Nose Left   12/19/17    1210     188   Incision (Closed) 12/19/17 Nose Right   12/19/17    1210     188          Labs/Imaging Results for orders placed or performed during the hospital encounter of 06/25/18 (from the past 48 hour(s))  CBC with Differential/Platelet     Status: Abnormal   Collection Time: 06/25/18  7:45  AM  Result Value Ref Range   WBC 6.3 4.0 - 10.5 K/uL   RBC 5.00 4.22 - 5.81 MIL/uL   Hemoglobin 14.2 13.0 - 17.0 g/dL   HCT 46.1 39.0 - 52.0 %   MCV 92.2 80.0 - 100.0 fL   MCH 28.4 26.0 - 34.0 pg   MCHC 30.8 30.0 - 36.0 g/dL   RDW 13.9 11.5 - 15.5 %   Platelets 106 (L) 150 - 400 K/uL    Comment: PLATELET COUNT CONFIRMED BY SMEAR SPECIMEN CHECKED FOR CLOTS Immature Platelet Fraction may be clinically indicated, consider ordering this additional test [id number]    nRBC 0.0 0.0 - 0.2 %   Neutrophils Relative % 94 %   Neutro Abs 5.9 1.7 - 7.7 K/uL   Lymphocytes Relative 5 %   Lymphs Abs 0.3 (L) 0.7 - 4.0 K/uL   Monocytes Relative 1 %   Monocytes Absolute 0.0 (L) 0.1 - 1.0 K/uL   Eosinophils Relative 0 %   Eosinophils Absolute 0.0 0.0 - 0.5 K/uL   Basophils Relative 0 %   Basophils Absolute 0.0 0.0 - 0.1 K/uL   Immature Granulocytes 0 %   Abs Immature Granulocytes 0.02 0.00 - 0.07 K/uL    Comment: Performed at [facility] [address] [place], [place] [address]   Comprehensive metabolic panel     Status: Abnormal   Collection Time: 06/25/18  7:45 AM  Result Value Ref Range   Sodium 139 135 - 145 mmol/L   Potassium 3.5 3.5 - 5.1 mmol/L   Chloride 104 98 - 111 mmol/L   CO2 24 22 - 32 mmol/L   Glucose, Bld 93 70 - 99 mg/dL   BUN 19 8 - 23 mg/dL   Creatinine, Ser 0.84 0.61 - 1.24 mg/dL   Calcium 8.7 (L) 8.9 - 10.3 mg/dL   Total Protein 7.4 6.5 - 8.1 g/dL   Albumin 4.2 3.5 - 5.0 g/dL   AST 18 15 - 41 U/L   ALT 8 0 - 44 U/L   Alkaline Phosphatase 75 38 - 126 U/L   Total Bilirubin 1.5 (H) 0.3 - 1.2 mg/dL   GFR calc non Af Amer >60 >60 mL/min   GFR calc Af Amer >60 >60 mL/min   Anion gap 11 5 - 15    Comment: Performed at [facility], [address] [place], [place] [address]  Brain natriuretic peptide     Status: None   Collection Time: 06/25/18  7:45 AM  Result Value Ref Range   B Natriuretic Peptide 81.0 0.0 - 100.0 pg/mL    Comment: Performed at [facility], [address], [place],  [address]  Urinalysis, Routine w reflex microscopic     Status: Abnormal   Collection Time: 06/25/18  9:48 AM  Result Value Ref Range   Color, Urine YELLOW YELLOW   APPearance CLEAR CLEAR   Specific Gravity, Urine 1.014 1.005 - 1.030   pH 5.0 5.0 - 8.0   Glucose, UA NEGATIVE NEGATIVE mg/dL   Hgb urine dipstick SMALL (A) NEGATIVE   Bilirubin Urine NEGATIVE NEGATIVE   Ketones, ur NEGATIVE NEGATIVE mg/dL   Protein, ur NEGATIVE NEGATIVE mg/dL   Nitrite POSITIVE (A) NEGATIVE   Leukocytes, UA NEGATIVE NEGATIVE   RBC / HPF 0-5 0 - 5 RBC/hpf   WBC, UA 0-5 0 - 5 WBC/hpf   Bacteria, UA RARE (A) NONE SEEN   Squamous Epithelial / LPF 0-5 0 - 5   Mucus PRESENT  Comment: Performed at [facility], [address], [place], [place] [address]   Dg Chest 2 View  Result Date: 06/25/2018 CLINICAL DATA:  Elevated heart rate with tremors beginning this morning. EXAM: CHEST - 2 VIEW COMPARISON:  08/11/2016 FINDINGS: Lungs are adequately inflated without focal  airspace consolidation or effusion. Slight prominence of the perihilar markings. Stable borderline cardiomegaly. Degenerative change of the spine. IMPRESSION: No acute cardiopulmonary disease. Electronically Signed   By: Marin Olp M.D.   On: 06/25/2018 08:33    Pending Labs Unresulted Labs (From admission, onward)   None      Vitals/Pain Today's Vitals   06/25/18 1300 06/25/18 1315 06/25/18 1330 06/25/18 1345  BP: (!) 101/53 98/61 (!) 101/56 (!) 106/48  Pulse:  90  87  Resp: (!) 29 (!) 27 (!) 29 (!) 23  SpO2:  98%  97%  Weight:      Height:      PainSc:        Isolation Precautions No active isolations  Medications Medications  0.9 %  sodium chloride infusion ( Intravenous New Bag/Given 06/25/18 0827)  diltiazem (CARDIZEM) 100 mg in dextrose 5 % 100 mL (1 mg/mL) infusion (7.5 mg/hr Intravenous Rate/Dose Change 06/25/18 1123)  LORazepam (ATIVAN) injection 1 mg (1 mg Intravenous Given 06/25/18 0829)  diltiazem (CARDIZEM) injection 5 mg (5 mg Intravenous Given 06/25/18 1013)    Mobility walks

## 2018-06-26 ENCOUNTER — Observation Stay (HOSPITAL_COMMUNITY)
Admit: 2018-06-26 | Discharge: 2018-06-26 | Disposition: A | Discharge status: Home / Self Care | Payer: Medicare Other | Attending: Cardiovascular Disease | Admitting: Cardiovascular Disease

## 2018-06-26 DIAGNOSIS — E669 Obesity, unspecified: Secondary | ICD-10-CM

## 2018-06-26 DIAGNOSIS — R509 Fever, unspecified: Secondary | ICD-10-CM | POA: Diagnosis not present

## 2018-06-26 DIAGNOSIS — I2510 Atherosclerotic heart disease of native coronary artery without angina pectoris: Secondary | ICD-10-CM

## 2018-06-26 DIAGNOSIS — R29818 Other symptoms and signs involving the nervous system: Secondary | ICD-10-CM | POA: Diagnosis not present

## 2018-06-26 DIAGNOSIS — I482 Chronic atrial fibrillation, unspecified: Secondary | ICD-10-CM

## 2018-06-26 DIAGNOSIS — I4891 Unspecified atrial fibrillation: Secondary | ICD-10-CM | POA: Diagnosis not present

## 2018-06-26 DIAGNOSIS — I10 Essential (primary) hypertension: Secondary | ICD-10-CM | POA: Diagnosis not present

## 2018-06-26 DIAGNOSIS — D696 Thrombocytopenia, unspecified: Secondary | ICD-10-CM

## 2018-06-26 LAB — TSH: TSH: 0.617 u[IU]/mL (ref 0.350–4.500)

## 2018-06-26 LAB — BASIC METABOLIC PANEL
Anion gap: 6 (ref 5–15)
BUN: 20 mg/dL (ref 8–23)
CHLORIDE: 106 mmol/L (ref 98–111)
CO2: 26 mmol/L (ref 22–32)
Calcium: 7.9 mg/dL — ABNORMAL LOW (ref 8.9–10.3)
Creatinine, Ser: 1.01 mg/dL (ref 0.61–1.24)
GFR calc Af Amer: >60 mL/min (ref >60)
GFR calc non Af Amer: >60 mL/min (ref >60)
Glucose, Bld: 108 mg/dL — ABNORMAL HIGH (ref 70–99)
Potassium: 4.2 mmol/L (ref 3.5–5.1)
Sodium: 138 mmol/L (ref 135–145)

## 2018-06-26 LAB — URINALYSIS, COMPLETE (UACMP) WITH MICROSCOPIC
BILIRUBIN URINE: NEGATIVE
Glucose, UA: NEGATIVE mg/dL
Ketones, ur: NEGATIVE mg/dL
Nitrite: POSITIVE — AB
Protein, ur: NEGATIVE mg/dL
Specific Gravity, Urine: 1.021 (ref 1.005–1.030)
pH: 6 (ref 5.0–8.0)

## 2018-06-26 LAB — VITAMIN B12: Vitamin B-12: 244 pg/mL (ref 180–914)

## 2018-06-26 LAB — CK: Total CK: 97 U/L (ref 49–397)

## 2018-06-26 LAB — CBC
HCT: 38.4 % — ABNORMAL LOW (ref 39.0–52.0)
HEMOGLOBIN: 12.2 g/dL — AB (ref 13.0–17.0)
MCH: 29.5 pg (ref 26.0–34.0)
MCHC: 31.8 g/dL (ref 30.0–36.0)
MCV: 92.8 fL (ref 80.0–100.0)
Platelets: 84 10*3/uL — ABNORMAL LOW (ref 150–400)
RBC: 4.14 MIL/uL — ABNORMAL LOW (ref 4.22–5.81)
RDW: 14.1 % (ref 11.5–15.5)
WBC: 6.5 10*3/uL (ref 4.0–10.5)
nRBC: 0 % (ref 0.0–0.2)

## 2018-06-26 LAB — TROPONIN I
Troponin I: <0.03 ng/mL (ref <0.03)
Troponin I: <0.03 ng/mL (ref <0.03)

## 2018-06-26 LAB — T4, FREE: Free T4: 1.01 ng/dL (ref 0.82–1.77)

## 2018-06-26 LAB — INFLUENZA PANEL BY PCR (TYPE A & B)
Influenza A By PCR: NEGATIVE
Influenza B By PCR: NEGATIVE

## 2018-06-26 LAB — MRSA PCR SCREENING: MRSA by PCR: NEGATIVE

## 2018-06-26 LAB — LACTIC ACID, PLASMA: LACTIC ACID, VENOUS: 2 mmol/L — AB (ref 0.5–1.9)

## 2018-06-26 LAB — MAGNESIUM: Magnesium: 1.7 mg/dL (ref 1.7–2.4)

## 2018-06-26 MED ORDER — SODIUM CHLORIDE 0.9 % IV BOLUS
1000.0000 mL | Freq: Once | INTRAVENOUS | Status: AC
  Administered 2018-06-26: 1000 mL via INTRAVENOUS

## 2018-06-26 NOTE — Procedures (Addendum)
History: 78 yo M being evaluated for tremor  Sedation: None  Technique: This is a 21 channel routine scalp EEG performed at the bedside with bipolar and monopolar montages arranged in accordance to the international 10/20 system of electrode placement. One channel was dedicated to EKG recording.    Background: The background consists of intermixed alpha and beta activities. There is a well defined posterior dominant rhythm of 9 Hz that attenuates with eye opening. Sleep is not recorded. No epileptiform discharges were seen.   Photic stimulation: Physiologic driving is not performed  EEG Abnormalities: None  Clinical Interpretation: This normal EEG is recorded in the waking state. There was no seizure or seizure predisposition recorded on this study. Please note that lack of epileptiform activity on EEG does not preclude the possibility of epilepsy.   Roland Rack, MD [facility] [phone]  If 7pm- 7am, please page neurology on call as listed in [place].

## 2018-06-26 NOTE — Progress Notes (Signed)
EEG completed; results pending.    

## 2018-06-26 NOTE — Care Management (Signed)
Eliquis 5mg .bid co-pay $37.00 Xarelt15mg . Daily co-pay $37.00. No PA required. [facility]acy : [facility].

## 2018-06-26 NOTE — Care Management (Signed)
From: Vivianne Spence  Sent: 06/26/2018 12:25 PM EST  To: Girard Cooter, RN  Subject: RE: benefits check                I just spoke to [facility] w/[employer].  Pt deductible is $195.00 has not been met.

## 2018-06-26 NOTE — Progress Notes (Signed)
PROGRESS NOTE  Paul Lam PNT:[id number] DOB: 01-21-1941 DOA: 06/25/2018 PCP: [name], MD  Brief History:  78 year old male with a history of chronic atrial fibrillation on apixaban, hypertension, hyperlipidemia, BPH, coronary artery disease, OSA, depression presenting with bilateral hand tremors greater than bilateral leg tremors when he woke up on 06/25/2018 a.m.  The patient stated that he could not make the tremors stop intentionally.  As result, EMS was activated.  The patient did not lose any consciousness.  He denied any focal extremity weakness, headache, visual disturbance, dysarthria, or dysesthesias.  He states that has never happened to him before.  Upon presentation to emergency department, the patient was noted to have atrial fibrillation with RVR.  He was given a bolus dose of diltiazem without improvement.  He was subsequently placed on diltiazem drip.  Overnight on 06/25/18-06/26/18, the patient developed a fever of 101.8 F.  However, the patient's atrial fibrillation became rate controlled, and he was weaned off of his diltiazem drip on the evening of 06/25/2018.  The patient himself denies any recent chest discomfort, shortness of breath, coughing, dizziness, syncope, nausea, vomiting, diarrhea, bone pain, dysuria, hematuria.  Assessment/Plan: Chronic atrial fibrillation, with RVR -Patient is now rate controlled -Suspect his RVR has been triggered by recent illness as documented by his fever in the evening of 06/25/2018 -Continue apixaban -CHADSVASc = 4 -01/18/2018 echo EF 55-60%, no WMA, mild AI, mild MR -Initially diagnosed with atrial fibrillation 12/19/2017 at the time of sinus surgery -Status post cardioversion 01/29/2018 -Given his recent cardioversion, and now reversion to atrial fibrillation, consult cardiology -Continue metoprolol titrate for now  Fever -Influenza PCR -Blood cultures x2 sets -UA and urine culture -Personally reviewed chest x-ray--no  edema or infiltrates -Lactic acid  Bilateral tremors -Etiology unclear -Obtain EEG -CPK -Follow-up outpatient neurology  Essential hypertension -Discontinue lisinopril to allow blood pressure margin for A. fib control  Hyperlipidemia -Continue statin  BPH -Continue finasteride and tamsulosin  Thrombocytopenia -Appears to be chronic dating back to 2012 -Check serum G86 -Check folic acid -Monitor for signs of bleeding    Disposition Plan:   Home 1/22 if stable Family Communication:   No Family at bedside  Consultants:  cardiology  Code Status:  FULL   DVT Prophylaxis:  apixaban   Procedures: As Listed in Progress Note Above  Antibiotics: None       Subjective: Patient denies fevers, chills, headache, chest pain, dyspnea, nausea, vomiting, diarrhea, abdominal pain, dysuria, hematuria, hematochezia, and melena.   Objective: Vitals:   06/26/18 0600 06/26/18 0700 06/26/18 0744 06/26/18 0800  BP: 105/86 (!) 117/54  140/74  Pulse: 76 67  75  Resp: (!) 23 (!) 24  16  Temp:   99.3 F (37.4 C)   TempSrc:   Oral   SpO2: 99% 98%  99%  Weight:      Height:        Intake/Output Summary (Last 24 hours) at 06/26/2018 0810 Last data filed at 06/25/2018 1855 Gross per 24 hour  Intake 627.03 ml  Output 625 ml  Net 2.03 ml   Weight change:  Exam:   General:  Pt is alert, follows commands appropriately, not in acute distress  HEENT: No icterus, No thrush, No neck mass, [place]/AT  Cardiovascular: IRRR, S1/S2, no rubs, no gallops  Respiratory: CTA bilaterally, no wheezing, no crackles, no rhonchi  Abdomen: Soft/+BS, non tender, non distended, no guarding  Extremities: 1 + LE edema, No lymphangitis,  No petechiae, No rashes, no synovitis   Data Reviewed: I have personally reviewed following labs and imaging studies Basic Metabolic Panel: Recent Labs  Lab 06/25/18 0745 06/26/18 0714  NA 139 138  K 3.5 4.2  CL 104 106  CO2 24 26  GLUCOSE 93 108*  BUN  19 20  CREATININE 0.84 1.01  CALCIUM 8.7* 7.9*   Liver Function Tests: Recent Labs  Lab 06/25/18 0745  AST 18  ALT 8  ALKPHOS 75  BILITOT 1.5*  PROT 7.4  ALBUMIN 4.2   No results for input(s): LIPASE, AMYLASE in the last 168 hours. No results for input(s): AMMONIA in the last 168 hours. Coagulation Profile: No results for input(s): INR, PROTIME in the last 168 hours. CBC: Recent Labs  Lab 06/25/18 0745  WBC 6.3  NEUTROABS 5.9  HGB 14.2  HCT 46.1  MCV 92.2  PLT 106*   Cardiac Enzymes: Recent Labs  Lab 06/25/18 2000 06/26/18 0055 06/26/18 0714  TROPONINI <0.03 <0.03 <0.03   BNP: Invalid input(s): POCBNP CBG: No results for input(s): GLUCAP in the last 168 hours. HbA1C: No results for input(s): HGBA1C in the last 72 hours. Urine analysis:    Component Value Date/Time   COLORURINE YELLOW 06/25/2018 [place] 06/25/2018 0948   LABSPEC 1.014 06/25/2018 0948   PHURINE 5.0 06/25/2018 0948   GLUCOSEU NEGATIVE 06/25/2018 0948   HGBUR SMALL (A) 06/25/2018 0948   BILIRUBINUR NEGATIVE 06/25/2018 0948   KETONESUR NEGATIVE 06/25/2018 0948   PROTEINUR NEGATIVE 06/25/2018 0948   UROBILINOGEN 0.2 08/15/2011 1248   NITRITE POSITIVE (A) 06/25/2018 0948   LEUKOCYTESUR NEGATIVE 06/25/2018 0948   Sepsis Labs: @LABRCNTIP (procalcitonin:4,lacticidven:4) ) Recent Results (from the past 240 hour(s))  MRSA PCR Screening     Status: None   Collection Time: 06/25/18  3:00 PM  Result Value Ref Range Status   MRSA by PCR NEGATIVE NEGATIVE Final    Comment:        The GeneXpert MRSA Assay (FDA approved for NASAL specimens only), is one component of a comprehensive MRSA colonization surveillance program. It is not intended to diagnose MRSA infection nor to guide or monitor treatment for MRSA infections. Performed at [facility], [address] [place], [place] [address]      Scheduled Meds: . ALPRAZolam  0.5 mg Oral QHS  . apixaban  5 mg Oral BID  .  brimonidine  1 drop Left Eye BID  . finasteride  5 mg Oral Q supper  . metoprolol tartrate  25 mg Oral BID  . nabumetone  750 mg Oral Daily  . simvastatin  40 mg Oral QHS  . tamsulosin  0.4 mg Oral 2 times per day   Continuous Infusions:  Procedures/Studies: Dg Chest 2 View  Result Date: 06/25/2018 CLINICAL DATA:  Elevated heart rate with tremors beginning this morning. EXAM: CHEST - 2 VIEW COMPARISON:  08/11/2016 FINDINGS: Lungs are adequately inflated without focal airspace consolidation or effusion. Slight prominence of the perihilar markings. Stable borderline cardiomegaly. Degenerative change of the spine. IMPRESSION: No acute cardiopulmonary disease. Electronically Signed   By: Marin Olp M.D.   On: 06/25/2018 08:33    Orson Eva, DO  [facility] Pager [phone]  If 7PM-7AM, please contact night-coverage [email or web address] Password [email or web address] 06/26/2018, 8:10 AM   LOS: 0 days

## 2018-06-26 NOTE — Consult Note (Addendum)
Cardiology Consult    Patient ID: Paul Lam; [id number]; [birth year]   Admit date: 06/25/2018 Date of Consult: 06/26/2018  Primary Care Provider: [name], MD Primary Cardiologist: Pixie Casino, MD  Primary Electrophysiologist: Followed by the Atrial Fibrillation [facility]  Patient Profile    Paul Lam is a 78 y.o. male with past medical history of CAD, HTN, HLD, OSA (on CPAP), and persistent atrial fibrillation (diagnosed in 12/2017 with successful DCCV in 01/2018) who is being seen today for the evaluation of atrial fibrillation with RVR at the request of Dr. Carles Collet.   History of Present Illness    Paul Lam was last examined by Dr. Debara Pickett in 03/2018 and was overall doing well from a cardiac perspective at that time. Was still facing emotional stress from when his grandson committed suicide a year prior and his daughter had attempted suicide after this. He was maintaining NSR and continued on his current medication regimen.   He presented to [facility] ED on 06/25/2018 for evaluation of worsening tremors. Initial labs showed WBC 6.3, Hgb 14.2, platelets 106, Na+ 139, K+ 3.5, and creatinine 0.84. BNP 81. Initial and cyclic troponin values have been negative. EKG showed atrial fibrillation with RVR, HR 117. CXR showed no acute cardiopulmonary disease.   He was initially started on IV Cardizem while in the ED but this has since been discontinued and transitioned to Lopressor 25mg  BID.    In talking with the patient and his wife today, he reports that he was told by his PCP back in 05/2018 that he was in atrial fibrillation. He purchased an Audiological scientist at that time and says this has continued to tell him he was in atrial fibrillation. He denies any specific episodes of elevated heart rate but is unable to tell me what his heart rate has been when checked at home. Says he has remained very active at home and put down over 15 bags on L[place] this past weekend without any symptoms. No recent  chest pain or palpitations. Denies any recent orthopnea, PND, or lower extremity edema. Says that yesterday, he developed a tremor along his upper extremities and this is what prompted him to come to the ED.  Denies any associated weakness, vision changes, or headaches. No recent fever or chills noted at home (temp was 101.8 while in the ED).   Past Medical History:  Diagnosis Date  . Anginal pain ([place])   . Anxiety   . Arthritis    generalized arthritis   . CAD (coronary artery disease)   . Cancer ([place])    skin cancer removed left ear    . Complication of anesthesia   . Depression   . Headache   . History of echocardiogram 02/19/2009   EF 50-55%; LA mild-mod dilated;   . History of kidney stones   . History of nuclear stress test 02/19/2009   low risk, normal   . Hyperlipidemia   . Hypertension   . Obesity (BMI 30-39.9)   . OSA (obstructive sleep apnea)    severe with AHI 25/hr now on BiPAP  . PONV (postoperative nausea and vomiting)   . Retinal detachment    with proliferative vitreoretinopathy  . Shortness of breath    related to weight   . Sleep apnea   . Vertigo     Past Surgical History:  Procedure Laterality Date  . APPENDECTOMY    . CARDIAC CATHETERIZATION  07/13/2005   noncritical CAD  . CARDIOVERSION N/A 01/29/2018  Procedure: CARDIOVERSION;  Surgeon: Dorothy Spark, MD;  Location: [place];  Service: Cardiovascular;  Laterality: N/A;  . EYE SURGERY    . FLEXIBLE SIGMOIDOSCOPY N/A 07/14/2015   Procedure: FLEXIBLE SIGMOIDOSCOPY;  Surgeon: Aviva Signs, MD;  [facility];  Service: Gastroenterology;  Laterality: N/A;  . JOINT REPLACEMENT    . NASAL SEPTOPLASTY W/ TURBINOPLASTY Bilateral 12/19/2017   Procedure: NASAL SEPTOPLASTY WITH TURBINATE REDUCTION;  Surgeon: [name], MD;  Location: [place];  Service: ENT;  Laterality: Bilateral;  . NASAL SINUS SURGERY    . PARS PLANA VITRECTOMY Left 06/20/2017   Procedure: PARS PLANA VITRECTOMY  WITH 25 GAUGE, REPAIR OF COMPLEX TRACTION RETINAL DETACHMENT, MEMBRANE PEEEL;  Surgeon: [name], MD;  Location: [place];  Service: Ophthalmology;  Laterality: Left;  . REPAIR OF COMPLEX TRACTION RETINAL DETACHMENT Left 06/20/2017  . RETINAL DETACHMENT SURGERY Right 04/2011  . SCLERAL BUCKLE WITH POSSIBLE 25 GAUGE PARS PLANA VITRECTOMY Left 05/22/2017   Procedure: SCLERAL BUCKLE, LASER, GAS INJECTION LEFT EYE;  Surgeon: [name], MD;  Location: [place];  Service: Ophthalmology;  Laterality: Left;  . SKIN CANCER EXCISION Left 2013   left ear skin ca removed  2 weeks ago - not completeely healed 08/15/11   . TONSILLECTOMY    . TOTAL KNEE ARTHROPLASTY  08/22/2011   Procedure: TOTAL KNEE ARTHROPLASTY;  Surgeon: Gearlean Alf, MD;  Location: [facility];  Service: Orthopedics;  Laterality: Right;     Home Medications:  Prior to Admission medications   Medication Sig Start Date End Date Taking? Authorizing Provider  ALPRAZolam Duanne Moron) 0.5 MG tablet Take 0.5 mg at bedtime by mouth. Anxiety    Yes [provider]  apixaban (ELIQUIS) 5 MG TABS tablet Take 1 tablet (5 mg total) by mouth 2 (two) times daily. 01/25/18  Yes Sherran Needs, NP  brimonidine (ALPHAGAN) 0.15 % ophthalmic solution Place 1 drop into the left eye 2 (two) times daily. 12/23/17  Yes [provider]  finasteride (PROSCAR) 5 MG tablet Take 5 mg daily with supper by mouth.  06/08/15  Yes [provider]  furosemide (LASIX) 20 MG tablet Take 20 mg daily as needed by mouth (for fluid retention.).    Yes [provider]  lisinopril (PRINIVIL,ZESTRIL) 10 MG tablet Take 10 mg by mouth daily.    Yes [provider]  Methylcellulose, Laxative, (CITRUCEL PO) Take 1 capsule by mouth daily.    Yes [provider]  nabumetone (RELAFEN) 750 MG tablet Take 750 mg by mouth daily.    Yes [provider]  potassium chloride (K-DUR) 10 MEQ tablet Take 10 mEq daily as needed by mouth  (with the lasix for fluid retention.). Take with Lasix 12/31/14  Yes [provider]  simvastatin (ZOCOR) 40 MG tablet Take 40 mg by mouth at bedtime.   Yes [provider]  Tamsulosin HCl (FLOMAX) 0.4 MG CAPS Take 0.4 mg by mouth See admin instructions. Take 1 capsule (0.4 mg) by mouth daily with supper (~1830) & take 1 capsule (0.4 mg) by mouth at bedtime.   Yes [provider]  NON FORMULARY at bedtime. VPAP    [provider]    Inpatient Medications: Scheduled Meds: . ALPRAZolam  0.5 mg Oral QHS  . apixaban  5 mg Oral BID  . brimonidine  1 drop Left Eye BID  . finasteride  5 mg Oral Q supper  . metoprolol tartrate  25 mg Oral BID  .  nabumetone  750 mg Oral Daily  . simvastatin  40 mg Oral QHS  . tamsulosin  0.4 mg Oral 2 times per day   Continuous Infusions:  PRN Meds: acetaminophen, ondansetron (ZOFRAN) IV  Allergies:   No Known Allergies  Social History:   Social History   Socioeconomic History  . Marital status: Married    Spouse name: Not on file  . Number of children: 1  . Years of education: Not on file  . Highest education level: Not on file  Occupational History  . Not on file  Social Needs  . Financial resource strain: Not on file  . Food insecurity:    Worry: Not on file    Inability: Not on file  . Transportation needs:    Medical: Not on file    Non-medical: Not on file  Tobacco Use  . Smoking status: Never Smoker  . Smokeless tobacco: Never Used  Substance and Sexual Activity  . Alcohol use: No    Frequency: Never  . Drug use: No  . Sexual activity: Not on file  Lifestyle  . Physical activity:    Days per week: Not on file    Minutes per session: Not on file  . Stress: Not on file  Relationships  . Social connections:    Talks on phone: Not on file    Gets together: Not on file    Attends religious service: Not on file    Active member of club or organization: Not on file    Attends meetings of clubs or  organizations: Not on file    Relationship status: Not on file  . Intimate partner violence:    Fear of current or ex partner: Not on file    Emotionally abused: Not on file    Physically abused: Not on file    Forced sexual activity: Not on file  Other Topics Concern  . Not on file  Social History Narrative  . Not on file     Family History:    Family History  Problem Relation Age of Onset  . Heart failure Mother        also arthritis  . Stroke Father        also emphysema  . Heart failure Father       Review of Systems    General:  No chills, fever, night sweats or weight changes.  Cardiovascular:  No chest pain, dyspnea on exertion, edema, orthopnea, palpitations, paroxysmal nocturnal dyspnea.  Dermatological: No rash, lesions/masses Respiratory: No cough, dyspnea Urologic: No hematuria, dysuria Abdominal:   No nausea, vomiting, diarrhea, bright red blood per rectum, melena, or hematemesis Neurologic:  No visual changes, wkns, changes in mental status. Positive for tremor.   All other systems reviewed and are otherwise negative except as noted above.  Physical Exam/Data    Vitals:   06/26/18 0600 06/26/18 0700 06/26/18 0744 06/26/18 0800  BP: 105/86 (!) 117/54  140/74  Pulse: 76 67  75  Resp: (!) 23 (!) 24  16  Temp:   99.3 F (37.4 C)   TempSrc:   Oral   SpO2: 99% 98%  99%  Weight:      Height:        Intake/Output Summary (Last 24 hours) at 06/26/2018 0856 Last data filed at 06/25/2018 1855 Gross per 24 hour  Intake 627.03 ml  Output 625 ml  Net 2.03 ml   Filed Weights   06/25/18 0736 06/25/18 1452 06/26/18 0500  Weight:  117.9 kg 114.8 kg 114.8 kg   Body mass index is 30.01 kg/m.   General: Pleasant, Caucasian male appearing in NAD. Psych: Normal affect. Neuro: Alert and oriented X 3. Moves all extremities spontaneously. HEENT: Normal  Neck: Supple without bruits or JVD. Lungs:  Resp regular and unlabored, CTA without wheezing or  rales. Heart: Irregularly irregular. No s3, s4, or murmurs. Abdomen: Soft, non-tender, non-distended, BS + x 4.  Extremities: No clubbing, cyanosis or edema. DP/PT/Radials 2+ and equal bilaterally.  Telemetry:  Telemetry was personally reviewed and demonstrates: Atrial fibrillation, HR in mid-50's to 70's.    Labs/Studies     Relevant CV Studies:  Echocardiogram: 01/2018 Study Conclusions  - Left ventricle: The cavity size was normal. Wall thickness was   increased in a pattern of mild LVH. Systolic function was normal.   The estimated ejection fraction was in the range of 55% to 60%.   Wall motion was normal; there were no regional wall motion   abnormalities. - Aortic valve: There was mild regurgitation. Valve area (Vmax):   2.19 cm^2. - Mitral valve: Calcified annulus. There was mild regurgitation.  Impressions:  - Normal LV systolic function; mild LVH; calcified aortic valve   with mild AI; mild MR.  Laboratory Data:  Chemistry Recent Labs  Lab 06/25/18 0745 06/26/18 0714  NA 139 138  K 3.5 4.2  CL 104 106  CO2 24 26  GLUCOSE 93 108*  BUN 19 20  CREATININE 0.84 1.01  CALCIUM 8.7* 7.9*  GFRNONAA >60 >60  GFRAA >60 >60  ANIONGAP 11 6    Recent Labs  Lab 06/25/18 0745  PROT 7.4  ALBUMIN 4.2  AST 18  ALT 8  ALKPHOS 75  BILITOT 1.5*   Hematology Recent Labs  Lab 06/25/18 0745 06/26/18 0714  WBC 6.3 6.5  RBC 5.00 4.14*  HGB 14.2 12.2*  HCT 46.1 38.4*  MCV 92.2 92.8  MCH 28.4 29.5  MCHC 30.8 31.8  RDW 13.9 14.1  PLT 106* 84*   Cardiac Enzymes Recent Labs  Lab 06/25/18 2000 06/26/18 0055 06/26/18 0714  TROPONINI <0.03 <0.03 <0.03   No results for input(s): TROPIPOC in the last 168 hours.  BNP Recent Labs  Lab 06/25/18 0745  BNP 81.0    DDimer No results for input(s): DDIMER in the last 168 hours.  Radiology/Studies:  Dg Chest 2 View  Result Date: 06/25/2018 CLINICAL DATA:  Elevated heart rate with tremors beginning this  morning. EXAM: CHEST - 2 VIEW COMPARISON:  08/11/2016 FINDINGS: Lungs are adequately inflated without focal airspace consolidation or effusion. Slight prominence of the perihilar markings. Stable borderline cardiomegaly. Degenerative change of the spine. IMPRESSION: No acute cardiopulmonary disease. Electronically Signed   By: Marin Olp M.D.   On: 06/25/2018 08:33     Assessment & Plan    1. Paroxysmal Atrial Fibrillation - this was initially diagnosed in 12/2017 with successful DCCV in 01/2018. It was thought this was his initial recurrence but the patient tells me he was evaluated by his PCP 3-4 weeks ago and informed he was back in atrial fibrillation at that time. He has overall been asymptomatic and denies any specific palpitations.  - labs show WBC 6.3, Hgb 14.2, platelets 106, Na+ 139, K+ 3.5, and creatinine 0.84. BNP 81. Initial and cyclic troponin values have been negative. He was initially started on IV Cardizem while in the ED but this has since been discontinued and transitioned to Lopressor 25mg  BID. Appears he was not previously  on AV nodal blocking agents as HR had been well-controlled at his office visits.  - agree with starting Lopressor 25mg  BID. Would not further titrate given HR in the mid-50's overnight. Continue Eliquis 5mg  BID for anticoagulation. Given that he has overall been asymptomatic over the past month while in atrial fibrillation, would favor a rate-control strategy initially given recent DCCV. If he becomes symptomatic, would recommend referral back to the atrial fibrillation clinic for consideration of antiarrhythmic therapy.    2. CAD - listed in prior notes with most recent ischemic evaluation being a NST in 2010. Echo in 01/2018 showed a preserved EF with no regional WMA. Cyclic troponin values have been negative this admission and he denies any recent chest pain or dyspnea on exertion.  - continue BB and statin therapy. No longer on ASA given the need for  anticoagulation.   3. HTN - BP has been variable at 84/45 - 147/86 within the past 24 hours. Now stabilizing with discontinuation of IV Cardizem. PTA Lisinopril held given the initiation of Lopressor.   4. OSA - continued compliance with CPAP encouraged.  5. New-Onset Tremor -  EEG pending.  - per admitting team.    For questions or updates, please contact [place] Please consult [email or web address] for contact info under Cardiology/STEMI.  Signed, Erma Heritage, PA-C 06/26/2018, 8:56 AM Pager: [phone]  Patient seen and discussed with PA Ahmed Prima, I agree with her documentation above. 78 yo male history of CAD, HTN, HL, OSA, afib with prior cardioversion 01/29/18. Baseline low normal heart rates has not been on av nodal agents, his normal sinus rate low 60s  Presented with chief complaint of tremor, resolved with ativan in ER. Notes mention significant anxiety regarding suicide and suicide attempts by family members. Found to be back in afib in ER with elevated rates. Started on dilt gtt initially and later stopped around 430pmg yesterday, then started to oral lopressor 25mg  bid with first dose last night.   Notes indicate recent fevers, agree may be exacerbating his afib. Infectious workup per primary team.      BNP 81 K 3.5 Cr 0.84 WBC 6.3 Hgb 14.2 Plt 106  Trop neg x 3  EKG afib, lateral precordial ST depressions 01/2018 echo LVEF 55-60%, no WMAs CXR no acute process   From afib standpoint when previously in afib he felt asymptomatic, but did report some increase in energy after his cardioversion. Currently asymptomatic, there is not a clear cut indication at this time to further pursue rhythm control in the absence of symptoms and with adequate rate control. This can be revisited as outpatient as he recovers from his febrile illness and perhaps if he does not self convert from his afib.   We will follow telemetry tomorrow, no other recs at this time.    Carlyle Dolly MD

## 2018-06-26 NOTE — Progress Notes (Signed)
CRITICAL VALUE ALERT  Critical Value:  Lactic Acid 2.0  Date & Time Notied:  06/26/2018 @ 1025  Provider Notified: Dr. Carles Collet  Orders Received/Actions taken: No new orders currently

## 2018-06-27 DIAGNOSIS — I10 Essential (primary) hypertension: Secondary | ICD-10-CM | POA: Diagnosis not present

## 2018-06-27 DIAGNOSIS — I482 Chronic atrial fibrillation, unspecified: Secondary | ICD-10-CM | POA: Diagnosis not present

## 2018-06-27 DIAGNOSIS — F419 Anxiety disorder, unspecified: Secondary | ICD-10-CM | POA: Diagnosis not present

## 2018-06-27 DIAGNOSIS — I4891 Unspecified atrial fibrillation: Secondary | ICD-10-CM

## 2018-06-27 DIAGNOSIS — N390 Urinary tract infection, site not specified: Secondary | ICD-10-CM

## 2018-06-27 LAB — FOLATE RBC
Folate, Hemolysate: 422 ng/mL
Folate, RBC: 1055 ng/mL (ref >498)
Hematocrit: 40 % (ref 37.5–51.0)

## 2018-06-27 MED ORDER — LISINOPRIL 10 MG PO TABS: 5.0000 mg | ORAL_TABLET | Freq: Every day | ORAL | Status: DC

## 2018-06-27 MED ORDER — METOPROLOL TARTRATE 25 MG PO TABS: 25.0000 mg | ORAL_TABLET | Freq: Two times a day (BID) | ORAL | 1 refills | Status: DC

## 2018-06-27 MED ORDER — CEFDINIR 300 MG PO CAPS
300.0000 mg | ORAL_CAPSULE | Freq: Two times a day (BID) | ORAL | Status: DC
  Administered 2018-06-27: 300 mg via ORAL
  Filled 2018-06-27: qty 1

## 2018-06-27 MED ORDER — CEFDINIR 300 MG PO CAPS: 300.0000 mg | ORAL_CAPSULE | Freq: Two times a day (BID) | ORAL | 0 refills | Status: DC

## 2018-06-27 NOTE — Progress Notes (Signed)
Telemetry reviewed today, remains in rate controlled afib.  No med changes recommneded at this time. We will sign off inpatient care.   [facility] will sign off.   Medication Recommendations:  Lopressor 25mg  bid, eliquis 5mg  bid Other recommendations (labs, testing, etc):  none Follow up as an outpatient:  We will arrange afib clinic f/u appt

## 2018-06-27 NOTE — Progress Notes (Signed)
Patient alert and oriented x4. No complaints of pain, shortness of breath, chest pain, dizziness, nausea or vomiting. Patient tolerated PO meds and diet well. Appetite good. Patient up out of bed to commode and chair with supervision. Gait steady. Wife in to visit, emotional support provided. Discharge instructions, follow up appointments information and medication education gone over with patient and patient's wife Paul Lam). Both expressed full understanding of instructions, follow up information and medication education. CHF education gone over with patient and wife especially about keeping a daily weight log. All questions answered and both expressed full understanding with teach back. IV's removed without complications. Patient discharged home with all belongings via car( Wife driving patient home).

## 2018-06-27 NOTE — Discharge Summary (Signed)
Physician Discharge Summary  Paul Lam:[id number] DOB: [birth year] DOA: 06/25/2018  PCP: [name], MD  Admit date: 06/25/2018 Discharge date: 06/27/2018  Time spent: 35 minutes  Recommendations for Outpatient Follow-up:  1. Repeat basic metabolic panel to follow electrolytes and renal function 2. Repeat CBC to follow hemoglobin and platelets trend 3. Reassess blood pressure and further adjust antihypertensive regimen  Discharge Diagnoses:  Principal Problem:   Atrial fibrillation with RVR (HCC) Active Problems:   Anxiety   Morbid obesity due to excess calories (HCC)   Hypertension   Obesity (BMI 30-39.9)   Atrial fibrillation, chronic   Fever   Thrombocytopenia ([place])   Acute lower UTI   Discharge Condition: Stable and improved.  Patient discharged home with instructions to follow-up with PCP in 10 days and with cardiology service as an outpatient (office will arrange appointment details).  Diet recommendation: Heart healthy diet  Filed Weights   06/25/18 1452 06/26/18 0500 06/27/18 0500  Weight: 114.8 kg 114.8 kg 116.9 kg    History of present illness:  As per H&P written by Dr. Roderic Palau on 06/25/2020 77 y.o. male with medical history significant of chronic atrial fibrillation on anticoagulation, hypertension, hyperlipidemia, BPH, was in his usual state of health when he went to bed.  He woke up this morning with severe tremors.  He denies any chest pain, shortness of breath, palpitation, fever, cough, nausea, vomiting, diarrhea.  ED is severe tremors, he came to the ER for evaluation  ED Course: In the emergency room, he was noted to be tachycardic.  EKG showed rapid atrial fibrillation.  He was started on Cardizem infusion.  Basic labs including CBC chemistry was unrevealing.  Chest x-ray did not show any acute findings.  He received a dose of Ativan which significantly improved his tremors.  Hospital Course:  1-chronic atrial fibrillation, with RVR -Patient  started on metoprolol 25 mg twice a day with resolution of RVR component -Asymptomatic currently. -Cardiology will arrange outpatient follow-up -Secondary prevention with the use of Eliquis -Advised to maintain adequate hydration and to follow heart healthy diet.  2-UTI -Patient urinalysis demonstrating positive nitrite moderate amount of bacteria small amount of hemoglobin in the urine dipstick and also mild leukocytes -Empirically treated with cefdinir for 7 days -Advised to keep himself well-hydrated -Final culture results pending at discharge. -Patient afebrile at discharge, no nausea, no vomiting.  3-hyperlipidemia -Continue statins  4-BPH -No symptoms of urinary retention currently -Continue the use of Flomax and finasteride.  5-essential hypertension-blood pressure is well controlled with the use of metoprolol -Lisinopril has been decreased to only 5 mg daily -Advised to follow heart healthy diet -Reassess blood pressure and further adjust antihypertensive regimen as needed.  6-number cytopenia -Appears to be chronic dating back to 2012 -No signs of acute bleeding appreciated -Platelet count stable -Repeat CBC at follow-up visit to reassess trend.  Procedures:  See below for x-ray reports  EEG: No seizure or seizure predisposition recorded on the study; negative abnormal epileptic waves.  Consultations:  Cardiology service  Discharge Exam: Vitals:   06/27/18 1317 06/27/18 1318  BP: 111/60   Pulse:    Resp: 18   Temp:    SpO2:  99%    General: Febrile, no chest pain, no shortness of breath, no nausea, no vomiting, no palpitations.  Patient reports an increase frequency and also expressed having some strong odor in his urine. Cardiovascular: Rate controlled, no rubs, no gallops, no JVD. Respiratory: Good air movement bilaterally, no wheezing, no  crackles, no using accessory muscles. Abdomen: Soft, nontender, nondistended, positive bowel sounds Extremities:  No cyanosis, no clubbing, no open wounds.  Discharge Instructions   Discharge Instructions    (HEART FAILURE PATIENTS) Call MD:  Anytime you have any of the following symptoms: 1) 3 pound weight gain in 24 hours or 5 pounds in 1 week 2) shortness of breath, with or without a dry hacking cough 3) swelling in the hands, feet or stomach 4) if you have to sleep on extra pillows at night in order to breathe.   Complete by:  As directed    Diet - low sodium heart healthy   Complete by:  As directed    Discharge instructions   Complete by:  As directed    Keep yourself well hydrated  Take medications as prescribed  Arrange follow up with PCP in 10 days Please follow up with cardiology service as an outpatient (office will set up appointment) Follow heart healthy diet and monitor weight on daily basis.     Allergies as of 06/27/2018   No Known Allergies     Medication List    TAKE these medications   ALPRAZolam 0.5 MG tablet Commonly known as:  XANAX Take 0.5 mg at bedtime by mouth. Anxiety   apixaban 5 MG Tabs tablet Commonly known as:  ELIQUIS Take 1 tablet (5 mg total) by mouth 2 (two) times daily.   brimonidine 0.15 % ophthalmic solution Commonly known as:  ALPHAGAN Place 1 drop into the left eye 2 (two) times daily.   cefdinir 300 MG capsule Commonly known as:  OMNICEF Take 1 capsule (300 mg total) by mouth every 12 (twelve) hours for 7 days.   CITRUCEL PO Take 1 capsule by mouth daily.   finasteride 5 MG tablet Commonly known as:  PROSCAR Take 5 mg daily with supper by mouth.   furosemide 20 MG tablet Commonly known as:  LASIX Take 20 mg daily as needed by mouth (for fluid retention.).   lisinopril 10 MG tablet Commonly known as:  PRINIVIL,ZESTRIL Take 0.5 tablets (5 mg total) by mouth daily. Start taking on:  June 30, 2018 What changed:    how much to take  These instructions start on June 30, 2018. If you are unsure what to do until then, ask your  doctor or other care provider.   metoprolol tartrate 25 MG tablet Commonly known as:  LOPRESSOR Take 1 tablet (25 mg total) by mouth 2 (two) times daily.   nabumetone 750 MG tablet Commonly known as:  RELAFEN Take 750 mg by mouth daily.   NON FORMULARY at bedtime. VPAP   potassium chloride 10 MEQ tablet Commonly known as:  K-DUR Take 10 mEq daily as needed by mouth (with the lasix for fluid retention.). Take with Lasix   simvastatin 40 MG tablet Commonly known as:  ZOCOR Take 40 mg by mouth at bedtime.   tamsulosin 0.4 MG Caps capsule Commonly known as:  FLOMAX Take 0.4 mg by mouth See admin instructions. Take 1 capsule (0.4 mg) by mouth daily with supper (~1830) & take 1 capsule (0.4 mg) by mouth at bedtime.      No Known Allergies Follow-up Information    Sherran Needs, NP Follow up on 07/04/2018.   Specialties:  Nurse Practitioner, Cardiology Why:  Follow-Up with [facility] on 07/04/2018 at 1:30 PM.  Contact information: [place] [address] [phone]        [name], MD. Schedule  an appointment as soon as possible for a visit in 10 day(s).   Specialty:  Family Medicine Contact information: [address] [place] [address] [phone]        Pixie Casino, MD .   Specialty:  Cardiology Contact information: [address] [place] [address] [phone]           The results of significant diagnostics from this hospitalization (including imaging, microbiology, ancillary and laboratory) are listed below for reference.    Significant Diagnostic Studies: Dg Chest 2 View  Result Date: 06/25/2018 CLINICAL DATA:  Elevated heart rate with tremors beginning this morning. EXAM: CHEST - 2 VIEW COMPARISON:  08/11/2016 FINDINGS: Lungs are adequately inflated without focal airspace consolidation or effusion. Slight prominence of the perihilar markings. Stable borderline cardiomegaly.  Degenerative change of the spine. IMPRESSION: No acute cardiopulmonary disease. Electronically Signed   By: Marin Olp M.D.   On: 06/25/2018 08:33    Microbiology: Recent Results (from the past 240 hour(s))  MRSA PCR Screening     Status: None   Collection Time: 06/25/18  3:00 PM  Result Value Ref Range Status   MRSA by PCR NEGATIVE NEGATIVE Final    Comment:        The GeneXpert MRSA Assay (FDA approved for NASAL specimens only), is one component of a comprehensive MRSA colonization surveillance program. It is not intended to diagnose MRSA infection nor to guide or monitor treatment for MRSA infections. Performed at [facility], [address], [place], [place] [address]   Culture, blood (Routine X 2) w Reflex to ID Panel     Status: None (Preliminary result)   Collection Time: 06/26/18  8:44 AM  Result Value Ref Range Status   Specimen Description BLOOD LEFT ANTECUBITAL  Final   Special Requests   Final    BOTTLES DRAWN AEROBIC AND ANAEROBIC Blood Culture adequate volume   Culture   Final    NO GROWTH < 24 HOURS Performed at [facility], [address], [place], [place] [address]    Report Status PENDING  Incomplete  Culture, blood (Routine X 2) w Reflex to ID Panel     Status: None (Preliminary result)   Collection Time: 06/26/18  8:58 AM  Result Value Ref Range Status   Specimen Description BLOOD RIGHT WRIST  Final   Special Requests   Final    BOTTLES DRAWN AEROBIC ONLY Blood Culture adequate volume   Culture   Final    NO GROWTH < 24 HOURS Performed at [facility], [address], [place],  [address]    Report Status PENDING  Incomplete     Labs: Basic Metabolic Panel: Recent Labs  Lab 06/25/18 0745 06/26/18 0714 06/26/18 1014  NA 139 138  --   K 3.5 4.2  --   CL 104 106  --   CO2 24 26  --   GLUCOSE 93 108*  --   BUN 19 20  --   CREATININE 0.84 1.01  --   CALCIUM 8.7* 7.9*  --   MG  --   --  1.7   Liver Function Tests: Recent Labs  Lab  06/25/18 0745  AST 18  ALT 8  ALKPHOS 75  BILITOT 1.5*  PROT 7.4  ALBUMIN 4.2   CBC: Recent Labs  Lab 06/25/18 0745 06/26/18 0714 06/26/18 0834  WBC 6.3 6.5  --   NEUTROABS 5.9  --   --   HGB 14.2 12.2*  --  HCT 46.1 38.4* 40.0  MCV 92.2 92.8  --   PLT 106* 84*  --    Cardiac Enzymes: Recent Labs  Lab 06/25/18 2000 06/26/18 0055 06/26/18 0714 06/26/18 0834  CKTOTAL  --   --   --  97  TROPONINI <0.03 <0.03 <0.03  --    BNP: BNP (last 3 results) Recent Labs    06/25/18 0745  BNP 81.0   Signed:  Barton Dubois MD.  [facility] 06/27/2018, 2:25 PM

## 2018-06-29 LAB — URINE CULTURE: Culture: >=100000 — AB

## 2018-07-01 LAB — CULTURE, BLOOD (ROUTINE X 2)
Culture: NO GROWTH
Culture: NO GROWTH
SPECIAL REQUESTS: ADEQUATE
Special Requests: ADEQUATE

## 2018-07-03 DIAGNOSIS — N183 Chronic kidney disease, stage 3 (moderate): Secondary | ICD-10-CM | POA: Diagnosis not present

## 2018-07-03 DIAGNOSIS — I4891 Unspecified atrial fibrillation: Secondary | ICD-10-CM | POA: Diagnosis not present

## 2018-07-03 DIAGNOSIS — Z6839 Body mass index (BMI) 39.0-39.9, adult: Secondary | ICD-10-CM | POA: Diagnosis not present

## 2018-07-03 DIAGNOSIS — I509 Heart failure, unspecified: Secondary | ICD-10-CM | POA: Diagnosis not present

## 2018-07-04 ENCOUNTER — Ambulatory Visit (HOSPITAL_COMMUNITY)
Admit: 2018-07-04 | Discharge: 2018-07-04 | Disposition: A | Discharge status: Home / Self Care | Payer: Medicare Other | Source: Ambulatory Visit | Attending: Nurse Practitioner | Admitting: Nurse Practitioner

## 2018-07-04 VITALS — BP 112/58 | HR 51 | Ht 68.0 in | Wt 254.0 lb

## 2018-07-04 DIAGNOSIS — Z79899 Other long term (current) drug therapy: Secondary | ICD-10-CM | POA: Diagnosis not present

## 2018-07-04 DIAGNOSIS — Z87440 Personal history of urinary (tract) infections: Secondary | ICD-10-CM | POA: Diagnosis not present

## 2018-07-04 DIAGNOSIS — G4733 Obstructive sleep apnea (adult) (pediatric): Secondary | ICD-10-CM | POA: Diagnosis not present

## 2018-07-04 DIAGNOSIS — I2510 Atherosclerotic heart disease of native coronary artery without angina pectoris: Secondary | ICD-10-CM | POA: Insufficient documentation

## 2018-07-04 DIAGNOSIS — Z7901 Long term (current) use of anticoagulants: Secondary | ICD-10-CM | POA: Diagnosis not present

## 2018-07-04 DIAGNOSIS — I4891 Unspecified atrial fibrillation: Secondary | ICD-10-CM | POA: Diagnosis present

## 2018-07-04 DIAGNOSIS — Z85828 Personal history of other malignant neoplasm of skin: Secondary | ICD-10-CM | POA: Insufficient documentation

## 2018-07-04 DIAGNOSIS — I480 Paroxysmal atrial fibrillation: Secondary | ICD-10-CM | POA: Insufficient documentation

## 2018-07-04 DIAGNOSIS — E669 Obesity, unspecified: Secondary | ICD-10-CM | POA: Insufficient documentation

## 2018-07-04 DIAGNOSIS — F329 Major depressive disorder, single episode, unspecified: Secondary | ICD-10-CM | POA: Insufficient documentation

## 2018-07-04 DIAGNOSIS — M1990 Unspecified osteoarthritis, unspecified site: Secondary | ICD-10-CM | POA: Insufficient documentation

## 2018-07-04 DIAGNOSIS — Z8249 Family history of ischemic heart disease and other diseases of the circulatory system: Secondary | ICD-10-CM | POA: Diagnosis not present

## 2018-07-04 DIAGNOSIS — F419 Anxiety disorder, unspecified: Secondary | ICD-10-CM | POA: Diagnosis not present

## 2018-07-04 DIAGNOSIS — Z823 Family history of stroke: Secondary | ICD-10-CM | POA: Insufficient documentation

## 2018-07-04 DIAGNOSIS — Z6838 Body mass index (BMI) 38.0-38.9, adult: Secondary | ICD-10-CM | POA: Diagnosis not present

## 2018-07-04 DIAGNOSIS — E785 Hyperlipidemia, unspecified: Secondary | ICD-10-CM | POA: Diagnosis not present

## 2018-07-04 DIAGNOSIS — I10 Essential (primary) hypertension: Secondary | ICD-10-CM | POA: Insufficient documentation

## 2018-07-04 NOTE — Progress Notes (Signed)
Primary Care Physician: [name], MD Referring Physician: Dr. Benjamine Mola Cardiologist: Dr. Dionicia Abler Paul Lam is a 78 y.o. male with a h/o CAD, HTN, obesity,OSA, that was initially seen  in the afib clinic for evaluation for new onset afib found at time of sinus surgery  12/19/17. He was rate controlled. Chadsvasc score of 4. He is unaware of any fatigue or shortness of breath, palpitations associated with afib. He was  still having some small amount of bleeding from his nostrils. It was unsure of when his afib started as he is asymptomatic and has not seen a MD in some time.  He did not drink alcohol, minimal caffeine, uses Cpap.He is overweight and sedentary.  He was started on Eliquis and is back today and not having any issues with the anticoagulation. We discussed cardioversion but he is asymptomatic and unsure if he wants to undergo cardioversion but wants to go on vacation first and will be back in 2 weeks. Echo done today and shows normal EF.  F/u in afib clinic, 8/21. He is now back from his vacation and thinks he will pursue cardioversion. He still says that he is not symptomatic but he may see some improvement in energy with return to SR. He has not has any interruption in anticoagulation since eliquis was started.He is well rate controlled and has not required any rate control meds.  F/u in afib clinic, 9/3, he feels much improved in SR, successful cardioversion 8/26.   F/u in afib clinic 07/04/18. He had a hospitalization at [facility] for afib with rvr. He was also found to have a UTI and was treated.He was also started on BB for brady in SR. He has a HR today at 51 bpm but is not symptomatic with it. He feels improved.   Today, he denies symptoms of palpitations, chest pain, shortness of breath, orthopnea, PND, lower extremity edema, dizziness, presyncope, syncope, or neurologic sequela. The patient is tolerating medications without difficulties and is otherwise without complaint  today.   Past Medical History:  Diagnosis Date  . Anginal pain ([place])   . Anxiety   . Arthritis    generalized arthritis   . CAD (coronary artery disease)   . Cancer ([place])    skin cancer removed left ear    . Complication of anesthesia   . Depression   . Headache   . History of echocardiogram 02/19/2009   EF 50-55%; LA mild-mod dilated;   . History of kidney stones   . History of nuclear stress test 02/19/2009   low risk, normal   . Hyperlipidemia   . Hypertension   . Obesity (BMI 30-39.9)   . OSA (obstructive sleep apnea)    severe with AHI 25/hr now on BiPAP  . PONV (postoperative nausea and vomiting)   . Retinal detachment    with proliferative vitreoretinopathy  . Shortness of breath    related to weight   . Sleep apnea   . Vertigo    Past Surgical History:  Procedure Laterality Date  . APPENDECTOMY    . CARDIAC CATHETERIZATION  07/13/2005   noncritical CAD  . CARDIOVERSION N/A 01/29/2018   Procedure: CARDIOVERSION;  Surgeon: Dorothy Spark, MD;  Location[facility] ENDOSCOPY;  Service: Cardiovascular;  Laterality: N/A;  . EYE SURGERY    . FLEXIBLE SIGMOIDOSCOPY N/A 07/14/2015   Procedure: FLEXIBLE SIGMOIDOSCOPY;  Surgeon: Aviva Signs, MD;  Location: [facility] ENDO SUITE;  Service: Gastroenterology;  Laterality: N/A;  . JOINT REPLACEMENT    .  NASAL SEPTOPLASTY W/ TURBINOPLASTY Bilateral 12/19/2017   Procedure: NASAL SEPTOPLASTY WITH TURBINATE REDUCTION;  Surgeon: [name], MD;  Location: [facility];  Service: ENT;  Laterality: Bilateral;  . NASAL SINUS SURGERY    . PARS PLANA VITRECTOMY Left 06/20/2017   Procedure: PARS PLANA VITRECTOMY WITH 25 GAUGE, REPAIR OF COMPLEX TRACTION RETINAL DETACHMENT, MEMBRANE PEEEL;  Surgeon: [name], MD;  Location: [facility];  Service: Ophthalmology;  Laterality: Left;  . REPAIR OF COMPLEX TRACTION RETINAL DETACHMENT Left 06/20/2017  . RETINAL DETACHMENT SURGERY Right 04/2011  . SCLERAL BUCKLE WITH POSSIBLE 25 GAUGE PARS PLANA  VITRECTOMY Left 05/22/2017   Procedure: SCLERAL BUCKLE, LASER, GAS INJECTION LEFT EYE;  Surgeon: [name], MD;  Location: [facility];  Service: Ophthalmology;  Laterality: Left;  . SKIN CANCER EXCISION Left 2013   left ear skin ca removed  2 weeks ago - not completeely healed 08/15/11   . TONSILLECTOMY    . TOTAL KNEE ARTHROPLASTY  08/22/2011   Procedure: TOTAL KNEE ARTHROPLASTY;  Surgeon: Gearlean Alf, MD;  Location: [facility] ORS;  Service: Orthopedics;  Laterality: Right;    Current Outpatient Medications  Medication Sig Dispense Refill  . ALPRAZolam (XANAX) 0.5 MG tablet Take 0.5 mg at bedtime by mouth. Anxiety     . apixaban (ELIQUIS) 5 MG TABS tablet Take 1 tablet (5 mg total) by mouth 2 (two) times daily. 60 tablet 6  . brimonidine (ALPHAGAN) 0.15 % ophthalmic solution Place 1 drop into the left eye 2 (two) times daily.  3  . finasteride (PROSCAR) 5 MG tablet Take 5 mg daily with supper by mouth.   10  . furosemide (LASIX) 20 MG tablet Take 20 mg daily as needed by mouth (for fluid retention.).     Marland Kitchen lisinopril (PRINIVIL,ZESTRIL) 10 MG tablet Take 0.5 tablets (5 mg total) by mouth daily.    . Methylcellulose, Laxative, (CITRUCEL PO) Take 1 capsule by mouth daily.     . metoprolol tartrate (LOPRESSOR) 25 MG tablet Take 1 tablet (25 mg total) by mouth 2 (two) times daily. 60 tablet 1  . nabumetone (RELAFEN) 750 MG tablet Take 750 mg by mouth daily.     . NON FORMULARY at bedtime. VPAP    . potassium chloride (K-DUR) 10 MEQ tablet Take 10 mEq daily as needed by mouth (with the lasix for fluid retention.). Take with Lasix  0  . simvastatin (ZOCOR) 40 MG tablet Take 40 mg by mouth at bedtime.    . Tamsulosin HCl (FLOMAX) 0.4 MG CAPS Take 0.4 mg by mouth See admin instructions. Take 1 capsule (0.4 mg) by mouth daily with supper (~1830) & take 1 capsule (0.4 mg) by mouth at bedtime.     No current facility-administered medications for this encounter.     No Known Allergies  Social History    Socioeconomic History  . Marital status: Married    Spouse name: Not on file  . Number of children: 1  . Years of education: Not on file  . Highest education level: Not on file  Occupational History  . Not on file  Social Needs  . Financial resource strain: Not on file  . Food insecurity:    Worry: Not on file    Inability: Not on file  . Transportation needs:    Medical: Not on file    Non-medical: Not on file  Tobacco Use  . Smoking status: Never Smoker  . Smokeless tobacco: Never Used  Substance and  Sexual Activity  . Alcohol use: No    Frequency: Never  . Drug use: No  . Sexual activity: Not on file  Lifestyle  . Physical activity:    Days per week: Not on file    Minutes per session: Not on file  . Stress: Not on file  Relationships  . Social connections:    Talks on phone: Not on file    Gets together: Not on file    Attends religious service: Not on file    Active member of club or organization: Not on file    Attends meetings of clubs or organizations: Not on file    Relationship status: Not on file  . Intimate partner violence:    Fear of current or ex partner: Not on file    Emotionally abused: Not on file    Physically abused: Not on file    Forced sexual activity: Not on file  Other Topics Concern  . Not on file  Social History Narrative  . Not on file    Family History  Problem Relation Age of Onset  . Heart failure Mother        also arthritis  . Stroke Father        also emphysema  . Heart failure Father     ROS- All systems are reviewed and negative except as per the HPI above  Physical Exam: Vitals:   07/04/18 1338  BP: (!) 112/58  Pulse: (!) 51  Weight: 115.2 kg  Height: 5\' 8"  (1.727 m)   Wt Readings from Last 3 Encounters:  07/04/18 115.2 kg  06/27/18 116.9 kg  03/08/18 118.2 kg    Labs: Lab Results  Component Value Date   NA 138 06/26/2018   K 4.2 06/26/2018   CL 106 06/26/2018   CO2 26 06/26/2018   GLUCOSE 108  (H) 06/26/2018   BUN 20 06/26/2018   CREATININE 1.01 06/26/2018   CALCIUM 7.9 (L) 06/26/2018   MG 1.7 06/26/2018   Lab Results  Component Value Date   INR 1.11 08/15/2011   No results found for: CHOL, HDL, LDLCALC, TRIG   GEN- The patient is well appearing, alert and oriented x 3 today.   Head- normocephalic, atraumatic Eyes-  Sclera clear, conjunctiva pink Ears- hearing intact Oropharynx- clear Neck- supple, no JVP Lymph- no cervical lymphadenopathy Lungs- Clear to ausculation bilaterally, normal work of breathing Heart-regular rate and rhythm, no murmurs, rubs or gallops, PMI not laterally displaced GI- soft, NT, ND, + BS Extremities- no clubbing, cyanosis, or edema MS- no significant deformity or atrophy Skin- no rash or lesion Psych- euthymic mood, full affect Neuro- strength and sensation are intact  EKG- Sinus brady with  Low amplitude p waves at 51 bpm, pr int 154 ms, qrs int 92 ms, qtc 373 ms Epic records reviewed Echo-Study Conclusions  - Left ventricle: The cavity size was normal. Wall thickness was   increased in a pattern of mild LVH. Systolic function was normal.   The estimated ejection fraction was in the range of 55% to 60%.   Wall motion was normal; there were no regional wall motion   abnormalities. - Aortic valve: There was mild regurgitation. Valve area (Vmax):   2.19 cm^2. - Mitral valve: Calcified annulus. There was mild regurgitation.  Impressions:  - Normal LV systolic function; mild LVH; calcified aortic valve   with mild AI; mild MR.   Assessment and Plan: 1. Paroxysmal afib  Return of afib in the setting  of UTI Now on metoprolol 25 mg 1/2 tab bid In brady today but is not symptomatic Continue to watch If ERAF may need AAD's  2.Chadsvasc score of 4 Doing well with the addition of apixaban 5 mg bid, reminded not to miss doses No bleeding issues  F/u with Dr. Debara Pickett as scheduled afib clinic as needed  Butch Penny C. Felix Meras,  [facility] [address] [place], [place] [address] [phone]

## 2018-07-12 ENCOUNTER — Ambulatory Visit (INDEPENDENT_AMBULATORY_CARE_PROVIDER_SITE_OTHER): Payer: Medicare Other | Admitting: Otolaryngology

## 2018-07-12 DIAGNOSIS — J310 Chronic rhinitis: Secondary | ICD-10-CM

## 2018-07-22 DIAGNOSIS — G4733 Obstructive sleep apnea (adult) (pediatric): Secondary | ICD-10-CM | POA: Diagnosis not present

## 2018-07-26 DIAGNOSIS — Z0120 Encounter for dental examination and cleaning without abnormal findings: Secondary | ICD-10-CM | POA: Diagnosis not present

## 2018-08-01 DIAGNOSIS — B351 Tinea unguium: Secondary | ICD-10-CM | POA: Diagnosis not present

## 2018-08-01 DIAGNOSIS — I739 Peripheral vascular disease, unspecified: Secondary | ICD-10-CM | POA: Diagnosis not present

## 2018-08-01 DIAGNOSIS — L110 Acquired keratosis follicularis: Secondary | ICD-10-CM | POA: Diagnosis not present

## 2018-08-20 DIAGNOSIS — G4733 Obstructive sleep apnea (adult) (pediatric): Secondary | ICD-10-CM | POA: Diagnosis not present

## 2018-08-22 DIAGNOSIS — L570 Actinic keratosis: Secondary | ICD-10-CM | POA: Diagnosis not present

## 2018-08-22 DIAGNOSIS — D0439 Carcinoma in situ of skin of other parts of face: Secondary | ICD-10-CM | POA: Diagnosis not present

## 2018-08-22 DIAGNOSIS — D485 Neoplasm of uncertain behavior of skin: Secondary | ICD-10-CM | POA: Diagnosis not present

## 2018-08-29 DIAGNOSIS — C44329 Squamous cell carcinoma of skin of other parts of face: Secondary | ICD-10-CM | POA: Diagnosis not present

## 2018-09-20 DIAGNOSIS — G4733 Obstructive sleep apnea (adult) (pediatric): Secondary | ICD-10-CM | POA: Diagnosis not present

## 2018-10-17 DIAGNOSIS — B351 Tinea unguium: Secondary | ICD-10-CM | POA: Diagnosis not present

## 2018-10-17 DIAGNOSIS — I739 Peripheral vascular disease, unspecified: Secondary | ICD-10-CM | POA: Diagnosis not present

## 2018-10-17 DIAGNOSIS — L110 Acquired keratosis follicularis: Secondary | ICD-10-CM | POA: Diagnosis not present

## 2018-10-25 DIAGNOSIS — F419 Anxiety disorder, unspecified: Secondary | ICD-10-CM | POA: Diagnosis not present

## 2018-10-25 DIAGNOSIS — Z6837 Body mass index (BMI) 37.0-37.9, adult: Secondary | ICD-10-CM | POA: Diagnosis not present

## 2018-10-25 DIAGNOSIS — I10 Essential (primary) hypertension: Secondary | ICD-10-CM | POA: Diagnosis not present

## 2018-11-06 DIAGNOSIS — Z85828 Personal history of other malignant neoplasm of skin: Secondary | ICD-10-CM | POA: Diagnosis not present

## 2018-11-06 DIAGNOSIS — L570 Actinic keratosis: Secondary | ICD-10-CM | POA: Diagnosis not present

## 2018-11-06 DIAGNOSIS — L309 Dermatitis, unspecified: Secondary | ICD-10-CM | POA: Diagnosis not present

## 2018-11-07 ENCOUNTER — Encounter (INDEPENDENT_AMBULATORY_CARE_PROVIDER_SITE_OTHER): Payer: Medicare Other | Admitting: Ophthalmology

## 2018-11-07 ENCOUNTER — Other Ambulatory Visit: Payer: Self-pay

## 2018-11-07 DIAGNOSIS — H35372 Puckering of macula, left eye: Secondary | ICD-10-CM | POA: Diagnosis not present

## 2018-11-07 DIAGNOSIS — D3131 Benign neoplasm of right choroid: Secondary | ICD-10-CM

## 2018-11-07 DIAGNOSIS — I10 Essential (primary) hypertension: Secondary | ICD-10-CM

## 2018-11-07 DIAGNOSIS — H35033 Hypertensive retinopathy, bilateral: Secondary | ICD-10-CM | POA: Diagnosis not present

## 2018-11-07 DIAGNOSIS — H43813 Vitreous degeneration, bilateral: Secondary | ICD-10-CM

## 2018-11-21 ENCOUNTER — Ambulatory Visit (INDEPENDENT_AMBULATORY_CARE_PROVIDER_SITE_OTHER): Payer: Medicare Other | Admitting: Urology

## 2018-11-21 DIAGNOSIS — N401 Enlarged prostate with lower urinary tract symptoms: Secondary | ICD-10-CM

## 2018-11-21 DIAGNOSIS — R351 Nocturia: Secondary | ICD-10-CM

## 2018-11-21 DIAGNOSIS — N5201 Erectile dysfunction due to arterial insufficiency: Secondary | ICD-10-CM

## 2018-11-26 ENCOUNTER — Ambulatory Visit (INDEPENDENT_AMBULATORY_CARE_PROVIDER_SITE_OTHER): Payer: Medicare Other | Admitting: Otolaryngology

## 2018-11-26 DIAGNOSIS — J322 Chronic ethmoidal sinusitis: Secondary | ICD-10-CM

## 2018-11-26 DIAGNOSIS — J320 Chronic maxillary sinusitis: Secondary | ICD-10-CM | POA: Diagnosis not present

## 2018-12-10 ENCOUNTER — Ambulatory Visit (INDEPENDENT_AMBULATORY_CARE_PROVIDER_SITE_OTHER): Payer: Medicare Other | Admitting: Otolaryngology

## 2018-12-10 DIAGNOSIS — J320 Chronic maxillary sinusitis: Secondary | ICD-10-CM | POA: Diagnosis not present

## 2018-12-10 DIAGNOSIS — J310 Chronic rhinitis: Secondary | ICD-10-CM | POA: Diagnosis not present

## 2018-12-20 DIAGNOSIS — Z0000 Encounter for general adult medical examination without abnormal findings: Secondary | ICD-10-CM | POA: Diagnosis not present

## 2018-12-20 DIAGNOSIS — Z681 Body mass index (BMI) 19 or less, adult: Secondary | ICD-10-CM | POA: Diagnosis not present

## 2018-12-20 DIAGNOSIS — I4891 Unspecified atrial fibrillation: Secondary | ICD-10-CM | POA: Diagnosis not present

## 2018-12-20 DIAGNOSIS — Z1389 Encounter for screening for other disorder: Secondary | ICD-10-CM | POA: Diagnosis not present

## 2018-12-20 DIAGNOSIS — I2510 Atherosclerotic heart disease of native coronary artery without angina pectoris: Secondary | ICD-10-CM | POA: Diagnosis not present

## 2018-12-20 DIAGNOSIS — N183 Chronic kidney disease, stage 3 (moderate): Secondary | ICD-10-CM | POA: Diagnosis not present

## 2018-12-20 DIAGNOSIS — I10 Essential (primary) hypertension: Secondary | ICD-10-CM | POA: Diagnosis not present

## 2018-12-25 DIAGNOSIS — G4733 Obstructive sleep apnea (adult) (pediatric): Secondary | ICD-10-CM | POA: Diagnosis not present

## 2019-01-02 DIAGNOSIS — I739 Peripheral vascular disease, unspecified: Secondary | ICD-10-CM | POA: Diagnosis not present

## 2019-01-02 DIAGNOSIS — B351 Tinea unguium: Secondary | ICD-10-CM | POA: Diagnosis not present

## 2019-01-02 DIAGNOSIS — L110 Acquired keratosis follicularis: Secondary | ICD-10-CM | POA: Diagnosis not present

## 2019-01-10 DIAGNOSIS — H5213 Myopia, bilateral: Secondary | ICD-10-CM | POA: Diagnosis not present

## 2019-01-22 DIAGNOSIS — E6609 Other obesity due to excess calories: Secondary | ICD-10-CM | POA: Diagnosis not present

## 2019-01-22 DIAGNOSIS — Z6835 Body mass index (BMI) 35.0-35.9, adult: Secondary | ICD-10-CM | POA: Diagnosis not present

## 2019-01-22 DIAGNOSIS — F419 Anxiety disorder, unspecified: Secondary | ICD-10-CM | POA: Diagnosis not present

## 2019-01-25 ENCOUNTER — Other Ambulatory Visit: Payer: Self-pay

## 2019-01-25 ENCOUNTER — Telehealth: Payer: Self-pay | Admitting: Internal Medicine

## 2019-01-25 MED ORDER — APIXABAN 5 MG PO TABS: 5.0000 mg | ORAL_TABLET | Freq: Two times a day (BID) | ORAL | 1 refills | Status: DC

## 2019-01-25 NOTE — Telephone Encounter (Signed)
Refill sent.

## 2019-01-25 NOTE — Telephone Encounter (Signed)
New Message    *STAT* If patient is at the pharmacy, call can be transferred to refill team.   1. Which medications need to be refilled? (please list name of each medication and dose if known) Eliquis twice a day  2. Which pharmacy/location (including street and city if local pharmacy) is medication to be sent to? [place], [place]  3. Do they need a 30 day or 90 day supply? 30 day

## 2019-01-31 ENCOUNTER — Other Ambulatory Visit (HOSPITAL_COMMUNITY): Payer: Self-pay | Admitting: Nurse Practitioner

## 2019-01-31 ENCOUNTER — Other Ambulatory Visit: Payer: Self-pay

## 2019-01-31 MED ORDER — APIXABAN 5 MG PO TABS: ORAL_TABLET | ORAL | 1 refills | Status: DC

## 2019-01-31 NOTE — Telephone Encounter (Signed)
11m 118.2kg Scr 1.01 06/26/18 Lovw/hilty 03/08/18 Tommy Medal reviewed the interaction and Laurie Panda it to send

## 2019-02-13 DIAGNOSIS — L309 Dermatitis, unspecified: Secondary | ICD-10-CM | POA: Diagnosis not present

## 2019-02-18 DIAGNOSIS — S61411A Laceration without foreign body of right hand, initial encounter: Secondary | ICD-10-CM | POA: Diagnosis not present

## 2019-02-25 DIAGNOSIS — L821 Other seborrheic keratosis: Secondary | ICD-10-CM | POA: Diagnosis not present

## 2019-02-25 DIAGNOSIS — Z85828 Personal history of other malignant neoplasm of skin: Secondary | ICD-10-CM | POA: Diagnosis not present

## 2019-02-25 DIAGNOSIS — L570 Actinic keratosis: Secondary | ICD-10-CM | POA: Diagnosis not present

## 2019-03-13 DIAGNOSIS — L110 Acquired keratosis follicularis: Secondary | ICD-10-CM | POA: Diagnosis not present

## 2019-03-13 DIAGNOSIS — B351 Tinea unguium: Secondary | ICD-10-CM | POA: Diagnosis not present

## 2019-03-13 DIAGNOSIS — I739 Peripheral vascular disease, unspecified: Secondary | ICD-10-CM | POA: Diagnosis not present

## 2019-03-18 ENCOUNTER — Ambulatory Visit (INDEPENDENT_AMBULATORY_CARE_PROVIDER_SITE_OTHER): Payer: Medicare Other | Admitting: Otolaryngology

## 2019-03-18 ENCOUNTER — Other Ambulatory Visit: Payer: Self-pay

## 2019-03-18 DIAGNOSIS — J310 Chronic rhinitis: Secondary | ICD-10-CM | POA: Diagnosis not present

## 2019-03-21 ENCOUNTER — Other Ambulatory Visit: Payer: Self-pay

## 2019-03-21 ENCOUNTER — Encounter: Payer: Self-pay | Admitting: Internal Medicine

## 2019-03-21 ENCOUNTER — Ambulatory Visit (INDEPENDENT_AMBULATORY_CARE_PROVIDER_SITE_OTHER): Payer: Medicare Other | Admitting: Internal Medicine

## 2019-03-21 VITALS — BP 120/62 | HR 52 | Temp 97.2°F | Ht 68.0 in | Wt 230.4 lb

## 2019-03-21 DIAGNOSIS — G4733 Obstructive sleep apnea (adult) (pediatric): Secondary | ICD-10-CM | POA: Diagnosis not present

## 2019-03-21 DIAGNOSIS — I10 Essential (primary) hypertension: Secondary | ICD-10-CM | POA: Diagnosis not present

## 2019-03-21 DIAGNOSIS — I4819 Other persistent atrial fibrillation: Secondary | ICD-10-CM | POA: Diagnosis not present

## 2019-03-21 NOTE — Patient Instructions (Signed)
Medication Instructions:  Your physician recommends that you continue on your current medications as directed. Please refer to the Current Medication list given to you today.  *If you need a refill on your cardiac medications before your next appointment, please call your pharmacy*  Follow-Up: At [facility], you and your health needs are our priority.  As part of our continuing mission to provide you with exceptional heart care, we have created designated Provider Care Teams.  These Care Teams include your primary Cardiologist (physician) and Advanced Practice Providers (APPs -  Physician Assistants and Nurse Practitioners) who all work together to provide you with the care you need, when you need it.  Your next appointment:   12 month(s)  The format for your next appointment:   In Person  Provider:   You may see Kenneth C Hilty, MD or one of the following Advanced Practice Providers on your designated Care Team:    Hao Meng, PA-C  Angela Duke, PA-C or   Krista Kroeger, PA-C   Other Instructions   

## 2019-03-21 NOTE — Progress Notes (Signed)
OFFICE NOTE  Chief Complaint:  No complaints  Primary Care Physician: [name], MD  HPI:  Paul Lam is a 78 year old gentleman with a history of coronary disease with an EF of about 50-55%, also hypertension, dyslipidemia and sleep apnea on CPAP. Recently his EF had been reported to be lower, however, it is actually fairly normal. Overall he has done very well. No chest pain, shortness of breath, palpitations, presyncope or syncopal symptoms. He did have other non-cardiac problems over the past year, including a detached retina, skin cancer with grafting to the left ear, as well as a total knee replacement. He tolerated these surgeries well without any evidence of MI. His only concern today is some positional dizziness. He says that he gets dizzy when he bends over too quickly and sits up. He also gets dizzy sometimes when turning his head to the side. It was thought this might be due to positional orthostasis and his Lasix was held recently but there has been no significant change in his symptoms. During his episodes he also feels like there is some spinning of the room which sounds like vertigo to me.  Paul Lam returns today he is doing fairly well. He has felt somewhat fatigued recently however and it is noted that he is bradycardic today. He still get some positional dizziness and had one episode of vertigo. He was seen at [place] and they thought that there was possibly some insufficiency to the brain may be vertebrobasilar insufficiency that was leading to his vestibular problems.  Some Paul Lam back in the office today. Overall he is doing fairly well. He continues to struggle with vertigo and positional dizziness, mainly when bending over. He has chronic lower extremity edema which is likely due to venous hypertension. I've offered venous Dopplers before but he seems to be asymptomatic with it and he is declined. He's followed for sleep apnea by Dr. Radford Pax and saw her  recently and seems to be fairly compliant with this regimen. He takes furosemide several times a week for swelling.  01/04/2016  Paul Lam returns today for follow-up. He denies any chest pain or worsening shortness of breath. Unfortunately weight is up somewhat. He is not exercising regularly. Blood pressure is well-controlled at 120/56. He had recent follow-up with his primary care provider reported his cholesterol is at goal as well. He is going to see his urologist in a few weeks. He is compliant with CPAP and generally feels that he sleeps well at night.  01/23/2017  Paul Lam was seen today in follow-up. Today he is very sad and is accompanied by his wife. Their grandson recently committed suicide. I was to commend him on his weight loss, but I suspect the weight loss is been due to poor appetite since his grieving. He's also had some depression is now on medications. Overall he denies any chest pain or worsening shortness of breath. His blood pressure actually looks much better today, probably attributed to the weight loss. EKG is sinus rhythm.  03/09/2018  Paul Lam is seen today in annual follow-up.  He seemed to be doing well although did break down when he recalled their grandson who committed suicide last year and told me that his granddaughter also had attempted suicide by overdose.  Recently he had new onset atrial fibrillation and underwent cardioversion.  He followed up with Roderic Palau in the A. fib clinic and is been on Eliquis.  He seems to be maintaining sinus rhythm.  Otherwise Paul Lam has had no new issues.  Weight still is an issue.  Blood pressure was little elevated today however did come down.  Labs from July showed total cholesterol 113, HDL 44, LDL 61 and triglycerides 41.  03/21/2019  Paul Lam returns today for follow-up.  Recently was hospitalized and had urinary tract infection.  This was complicated by recurrent atrial fibrillation.  He had had a cardioversion last  year and maintained sinus.  According to him he thought he felt somewhat better after getting back to sinus rhythm however after this episode where he recently went into A. fib, he said he could not tell the difference.  He was therefore rate controlled and placed on anticoagulation.  He did follow-up with Roderic Palau, NP in the A. fib clinic.  At the time she felt this could be possibly ERAF, but was unclear whether he needed antiarrhythmic therapy.  Today he returns and he remains in atrial fibrillation.  He says he monitors it on his apple watch.  In general other than being alerted by his watch he has no symptoms related to it.  His only other complaint is he gets some occasional lower extremity swelling but thinks may be related to salt indiscretion.  PMHx:  Past Medical History:  Diagnosis Date  . Anginal pain ([place])   . Anxiety   . Arthritis    generalized arthritis   . CAD (coronary artery disease)   . Cancer ([place])    skin cancer removed left ear    . Complication of anesthesia   . Depression   . Headache   . History of echocardiogram 02/19/2009   EF 50-55%; LA mild-mod dilated;   . History of kidney stones   . History of nuclear stress test 02/19/2009   low risk, normal   . Hyperlipidemia   . Hypertension   . Obesity (BMI 30-39.9)   . OSA (obstructive sleep apnea)    severe with AHI 25/hr now on BiPAP  . PONV (postoperative nausea and vomiting)   . Retinal detachment    with proliferative vitreoretinopathy  . Shortness of breath    related to weight   . Sleep apnea   . Vertigo     Past Surgical History:  Procedure Laterality Date  . APPENDECTOMY    . CARDIAC CATHETERIZATION  07/13/2005   noncritical CAD  . CARDIOVERSION N/A 01/29/2018   Procedure: CARDIOVERSION;  Surgeon: Dorothy Spark, MD;  Location: [facility] ENDOSCOPY;  Service: Cardiovascular;  Laterality: N/A;  . EYE SURGERY    . FLEXIBLE SIGMOIDOSCOPY N/A 07/14/2015   Procedure: FLEXIBLE SIGMOIDOSCOPY;  Surgeon: Aviva Signs, MD;  [facility];  Service: Gastroenterology;  Laterality: N/A;  . JOINT REPLACEMENT    . NASAL SEPTOPLASTY W/ TURBINOPLASTY Bilateral 12/19/2017   Procedure: NASAL SEPTOPLASTY WITH TURBINATE REDUCTION;  Surgeon: [name], MD;  Location: [place];  Service: ENT;  Laterality: Bilateral;  . NASAL SINUS SURGERY    . PARS PLANA VITRECTOMY Left 06/20/2017   Procedure: PARS PLANA VITRECTOMY WITH 25 GAUGE, REPAIR OF COMPLEX TRACTION RETINAL DETACHMENT, MEMBRANE PEEEL;  Surgeon: [name], MD;  Location: [place];  Service: Ophthalmology;  Laterality: Left;  . REPAIR OF COMPLEX TRACTION RETINAL DETACHMENT Left 06/20/2017  . RETINAL DETACHMENT SURGERY Right 04/2011  . SCLERAL BUCKLE WITH POSSIBLE 25 GAUGE PARS PLANA VITRECTOMY Left 05/22/2017   Procedure: SCLERAL BUCKLE, LASER, GAS INJECTION LEFT EYE;  Surgeon: [name], MD;  Location: [place];  Service: Ophthalmology;  Laterality: Left;  . SKIN CANCER EXCISION Left 2013   left ear skin ca removed  2 weeks ago - not completeely healed 08/15/11   . TONSILLECTOMY    . TOTAL KNEE ARTHROPLASTY  08/22/2011   Procedure: TOTAL KNEE ARTHROPLASTY;  Surgeon: Gearlean Alf, MD;  Location: [facility];  Service: Orthopedics;  Laterality: Right;    FAMHx:  Family History  Problem Relation Age of Onset  . Heart failure Mother        also arthritis  . Stroke Father        also emphysema  . Heart failure Father     SOCHx:   reports that he has never smoked. He has never used smokeless tobacco. He reports that he does not drink alcohol or use drugs.  ALLERGIES:  No Known Allergies  ROS: Pertinent items noted in HPI and remainder of comprehensive ROS otherwise negative.  HOME MEDS: Current Outpatient Medications  Medication Sig Dispense Refill  . ALPRAZolam (XANAX) 0.5 MG tablet Take 0.5 mg at bedtime by mouth. Anxiety     . apixaban (ELIQUIS) 5 MG TABS tablet TAKE (1) TABLET TWICE DAILY. 180 tablet 1  .  brimonidine (ALPHAGAN) 0.15 % ophthalmic solution Place 1 drop into the left eye 2 (two) times daily.  3  . Calcium Polycarbophil (FIBER-CAPS PO) Take by mouth.    . diphenhydrAMINE HCl (DIPHENHYDRAMINE COUGH PO) Take by mouth.    . finasteride (PROSCAR) 5 MG tablet Take 5 mg daily with supper by mouth.   10  . furosemide (LASIX) 20 MG tablet Take 20 mg daily as needed by mouth (for fluid retention.).     Marland Kitchen lisinopril (PRINIVIL,ZESTRIL) 10 MG tablet Take 0.5 tablets (5 mg total) by mouth daily.    . Methylcellulose, Laxative, (CITRUCEL PO) Take 1 capsule by mouth daily.     . metoprolol tartrate (LOPRESSOR) 25 MG tablet Take 1 tablet (25 mg total) by mouth 2 (two) times daily. 60 tablet 1  . Misc Natural Products (TART CHERRY ADVANCED PO) Take by mouth.    . nabumetone (RELAFEN) 750 MG tablet Take 750 mg by mouth daily.     . NON FORMULARY at bedtime. VPAP    . potassium chloride (K-DUR) 10 MEQ tablet Take 10 mEq daily as needed by mouth (with the lasix for fluid retention.). Take with Lasix  0  . simvastatin (ZOCOR) 40 MG tablet Take 40 mg by mouth at bedtime.    . Tamsulosin HCl (FLOMAX) 0.4 MG CAPS Take 0.4 mg by mouth See admin instructions. Take 1 capsule (0.4 mg) by mouth daily with supper (~1830) & take 1 capsule (0.4 mg) by mouth at bedtime.     No current facility-administered medications for this visit.     LABS/IMAGING: No results found for this or any previous visit (from the past 48 hour(s)). No results found.  VITALS: BP 120/62   Pulse (!) 52   Temp (!) 97.2 F (36.2 C)   Ht 5\' 8"  (1.727 m)   Wt 230 lb 6.4 oz (104.5 kg)   SpO2 97%   BMI 35.03 kg/m   EXAM: General appearance: alert and no distress Neck: no carotid bruit, no JVD and thyroid not enlarged, symmetric, no tenderness/mass/nodules Lungs: clear to auscultation bilaterally Heart: regular rate and rhythm, S1, S2 normal, no murmur, click, rub or gallop Abdomen: soft, non-tender; bowel sounds normal; no masses,   no organomegaly Extremities: extremities normal, atraumatic, no cyanosis or edema Pulses: 2+  and symmetric Skin: Skin color, texture, turgor normal. No rashes or lesions Neurologic: GroPsych: Pleasanty normal Psych: Pleasant  EKG: A. fib with a slow ventricular sponsor at 52-personally reviewed  ASSESSMENT: 1. Persistent atrial fibrillation-CHADSVASC score of 4, on Eliquis, previously cardioverted however reverted back to A. fib-asymptomatic 2. Hypertension 3. Dyslipidemia 4. Obesity 5. Dizziness 6. Anxiety 7. OSA on CPAP 8. Chronic lower extremity edema secondary to venous hypertension  PLAN: 1.   Mr. Tipple has had persistent A. fib on Eliquis and now is rate controlled.  He was previously cardioverted however reverted back to A. fib and I suspect he is asymptomatic with this.  We will continue with a rate control strategy and anticoagulation.  His blood pressure is well controlled.  His cholesterol has been at goal.  He is compliant with CPAP.  He was advised he could use an extra 20 mg Lasix in the afternoon as needed on days where his edema is worse.  No changes were made to his medicines today.  Plan follow-up with me annually or sooner as necessary.  Pixie Casino, MD, [facility], [place] Director of the Advanced Lipid Disorders &  Cardiovascular Risk Reduction Clinic Diplomate of the American Board of Clinical Lipidology Attending Cardiologist  Direct Dial: [phone]  Fax: [phone]  Website:  [email or web address] 03/21/2019, 11:24 AM

## 2019-03-25 ENCOUNTER — Other Ambulatory Visit: Payer: Self-pay | Admitting: Internal Medicine

## 2019-04-22 DIAGNOSIS — N342 Other urethritis: Secondary | ICD-10-CM | POA: Diagnosis not present

## 2019-04-22 DIAGNOSIS — E6609 Other obesity due to excess calories: Secondary | ICD-10-CM | POA: Diagnosis not present

## 2019-04-22 DIAGNOSIS — Z6835 Body mass index (BMI) 35.0-35.9, adult: Secondary | ICD-10-CM | POA: Diagnosis not present

## 2019-05-09 ENCOUNTER — Encounter (INDEPENDENT_AMBULATORY_CARE_PROVIDER_SITE_OTHER): Payer: Medicare Other | Admitting: Ophthalmology

## 2019-05-09 DIAGNOSIS — I10 Essential (primary) hypertension: Secondary | ICD-10-CM | POA: Diagnosis not present

## 2019-05-09 DIAGNOSIS — H401122 Primary open-angle glaucoma, left eye, moderate stage: Secondary | ICD-10-CM

## 2019-05-09 DIAGNOSIS — H35372 Puckering of macula, left eye: Secondary | ICD-10-CM | POA: Diagnosis not present

## 2019-05-09 DIAGNOSIS — H35033 Hypertensive retinopathy, bilateral: Secondary | ICD-10-CM | POA: Diagnosis not present

## 2019-05-09 DIAGNOSIS — H338 Other retinal detachments: Secondary | ICD-10-CM

## 2019-05-09 DIAGNOSIS — H43813 Vitreous degeneration, bilateral: Secondary | ICD-10-CM

## 2019-06-19 DIAGNOSIS — L110 Acquired keratosis follicularis: Secondary | ICD-10-CM | POA: Diagnosis not present

## 2019-06-19 DIAGNOSIS — B351 Tinea unguium: Secondary | ICD-10-CM | POA: Diagnosis not present

## 2019-06-19 DIAGNOSIS — I739 Peripheral vascular disease, unspecified: Secondary | ICD-10-CM | POA: Diagnosis not present

## 2019-06-28 ENCOUNTER — Other Ambulatory Visit: Payer: Self-pay

## 2019-06-28 MED ORDER — TAMSULOSIN HCL 0.4 MG PO CAPS: 0.4000 mg | ORAL_CAPSULE | ORAL | 3 refills | Status: DC

## 2019-07-01 DIAGNOSIS — G4733 Obstructive sleep apnea (adult) (pediatric): Secondary | ICD-10-CM | POA: Diagnosis not present

## 2019-07-03 ENCOUNTER — Ambulatory Visit: Payer: Medicare Other | Admitting: Urology

## 2019-07-17 DIAGNOSIS — Z0120 Encounter for dental examination and cleaning without abnormal findings: Secondary | ICD-10-CM | POA: Diagnosis not present

## 2019-07-19 DIAGNOSIS — E6609 Other obesity due to excess calories: Secondary | ICD-10-CM | POA: Diagnosis not present

## 2019-07-19 DIAGNOSIS — I4891 Unspecified atrial fibrillation: Secondary | ICD-10-CM | POA: Diagnosis not present

## 2019-07-19 DIAGNOSIS — I10 Essential (primary) hypertension: Secondary | ICD-10-CM | POA: Diagnosis not present

## 2019-07-19 DIAGNOSIS — N400 Enlarged prostate without lower urinary tract symptoms: Secondary | ICD-10-CM | POA: Diagnosis not present

## 2019-07-19 DIAGNOSIS — F419 Anxiety disorder, unspecified: Secondary | ICD-10-CM | POA: Diagnosis not present

## 2019-07-19 DIAGNOSIS — Z6835 Body mass index (BMI) 35.0-35.9, adult: Secondary | ICD-10-CM | POA: Diagnosis not present

## 2019-08-01 DIAGNOSIS — E6609 Other obesity due to excess calories: Secondary | ICD-10-CM | POA: Diagnosis not present

## 2019-08-01 DIAGNOSIS — D649 Anemia, unspecified: Secondary | ICD-10-CM | POA: Diagnosis not present

## 2019-08-01 DIAGNOSIS — Z6836 Body mass index (BMI) 36.0-36.9, adult: Secondary | ICD-10-CM | POA: Diagnosis not present

## 2019-08-01 DIAGNOSIS — D696 Thrombocytopenia, unspecified: Secondary | ICD-10-CM | POA: Diagnosis not present

## 2019-08-05 DIAGNOSIS — Z1211 Encounter for screening for malignant neoplasm of colon: Secondary | ICD-10-CM | POA: Diagnosis not present

## 2019-08-06 ENCOUNTER — Other Ambulatory Visit: Payer: Self-pay

## 2019-08-06 DIAGNOSIS — N401 Enlarged prostate with lower urinary tract symptoms: Secondary | ICD-10-CM

## 2019-08-06 MED ORDER — FINASTERIDE 5 MG PO TABS: 5.0000 mg | ORAL_TABLET | Freq: Every day | ORAL | 3 refills | Status: DC

## 2019-08-13 ENCOUNTER — Encounter: Payer: Self-pay | Admitting: Internal Medicine

## 2019-08-14 ENCOUNTER — Ambulatory Visit (INDEPENDENT_AMBULATORY_CARE_PROVIDER_SITE_OTHER): Payer: Medicare Other | Admitting: Urology

## 2019-08-14 ENCOUNTER — Encounter: Payer: Self-pay | Admitting: Urology

## 2019-08-14 VITALS — BP 123/77 | HR 56 | Temp 97.1°F | Ht 68.0 in | Wt 228.0 lb

## 2019-08-14 DIAGNOSIS — R351 Nocturia: Secondary | ICD-10-CM | POA: Diagnosis not present

## 2019-08-14 DIAGNOSIS — N5201 Erectile dysfunction due to arterial insufficiency: Secondary | ICD-10-CM | POA: Diagnosis not present

## 2019-08-14 DIAGNOSIS — N401 Enlarged prostate with lower urinary tract symptoms: Secondary | ICD-10-CM | POA: Diagnosis not present

## 2019-08-14 LAB — POCT URINALYSIS DIPSTICK
Bilirubin, UA: NEGATIVE
Blood, UA: NEGATIVE
Glucose, UA: NEGATIVE
Ketones, UA: NEGATIVE
Leukocytes, UA: NEGATIVE
Nitrite, UA: NEGATIVE
Protein, UA: NEGATIVE
Spec Grav, UA: 1.015 (ref 1.010–1.025)
Urobilinogen, UA: 0.2 E.U./dL
pH, UA: 5 (ref 5.0–8.0)

## 2019-08-14 LAB — BLADDER SCAN AMB NON-IMAGING: Scan Result: 51.9

## 2019-08-14 MED ORDER — TAMSULOSIN HCL 0.4 MG PO CAPS: 0.4000 mg | ORAL_CAPSULE | Freq: Two times a day (BID) | ORAL | 3 refills | Status: DC

## 2019-08-14 MED ORDER — FINASTERIDE 5 MG PO TABS: 5.0000 mg | ORAL_TABLET | Freq: Every day | ORAL | 3 refills | Status: DC

## 2019-08-14 NOTE — Progress Notes (Signed)
poUrological Symptom Review  Patient is experiencing the following symptoms: Urinary tract infection   Review of Systems  Gastrointestinal (upper)  : Negative for upper GI symptoms  Gastrointestinal (lower) : Negative for lower GI symptoms  Constitutional : Negative for symptoms  Skin: Negative for skin symptoms  Eyes: Negative for eye symptoms  Ear/Nose/Throat : Negative for Ear/Nose/Throat symptoms  Hematologic/Lymphatic: Negative for Hematologic/Lymphatic symptoms  Cardiovascular : Leg swelling  Respiratory : Negative for respiratory symptoms  Endocrine: Negative for endocrine symptoms  Musculoskeletal: Negative for musculoskeletal symptoms  Neurological: Negative for neurological symptoms  Psychologic: Negative for psychiatric symptoms

## 2019-08-14 NOTE — Progress Notes (Signed)
08/14/2019 11:57 AM   Paul Lam 16-Jun-1940 [id number]  Referring provider: [name], MD [address] [place],   [address]  Nocturia  HPI: Mr Hennion is a 79yo here for followup for BPH with LUTS, nocturia and ED. He is currently on Flomax BID and finasteride. He has stable nocturia 1x. No issues with urinary frequency and urgency. Stream good. No dysuria or hematuria. He has ED refractory to medical therapy. He has new afib and no is on eliquis. He is not interested in further ED therapy   PMH: Past Medical History:  Diagnosis Date  . Anginal pain ([place])   . Anxiety   . Arthritis    generalized arthritis   . CAD (coronary artery disease)   . Cancer ([place])    skin cancer removed left ear    . Complication of anesthesia   . Depression   . Headache   . History of echocardiogram 02/19/2009   EF 50-55%; LA mild-mod dilated;   . History of kidney stones   . History of nuclear stress test 02/19/2009   low risk, normal   . Hyperlipidemia   . Hypertension   . Obesity (BMI 30-39.9)   . OSA (obstructive sleep apnea)    severe with AHI 25/hr now on BiPAP  . PONV (postoperative nausea and vomiting)   . Retinal detachment    with proliferative vitreoretinopathy  . Shortness of breath    related to weight   . Sleep apnea   . Vertigo     Surgical History: Past Surgical History:  Procedure Laterality Date  . APPENDECTOMY    . CARDIAC CATHETERIZATION  07/13/2005   noncritical CAD  . CARDIOVERSION N/A 01/29/2018   Procedure: CARDIOVERSION;  Surgeon: Dorothy Spark, MD;  Location: [facility] ENDOSCOPY;  Service: Cardiovascular;  Laterality: N/A;  . EYE SURGERY    . FLEXIBLE SIGMOIDOSCOPY N/A 07/14/2015   Procedure: FLEXIBLE SIGMOIDOSCOPY;  Surgeon: Aviva Signs, MD;  [facility];  Service: Gastroenterology;  Laterality: N/A;  . JOINT REPLACEMENT    . NASAL SEPTOPLASTY W/ TURBINOPLASTY Bilateral 12/19/2017   Procedure: NASAL SEPTOPLASTY WITH TURBINATE  REDUCTION;  Surgeon: [name], MD;  Location: [place];  Service: ENT;  Laterality: Bilateral;  . NASAL SINUS SURGERY    . PARS PLANA VITRECTOMY Left 06/20/2017   Procedure: PARS PLANA VITRECTOMY WITH 25 GAUGE, REPAIR OF COMPLEX TRACTION RETINAL DETACHMENT, MEMBRANE PEEEL;  Surgeon: [name], MD;  Location: [place];  Service: Ophthalmology;  Laterality: Left;  . REPAIR OF COMPLEX TRACTION RETINAL DETACHMENT Left 06/20/2017  . RETINAL DETACHMENT SURGERY Right 04/2011  . SCLERAL BUCKLE WITH POSSIBLE 25 GAUGE PARS PLANA VITRECTOMY Left 05/22/2017   Procedure: SCLERAL BUCKLE, LASER, GAS INJECTION LEFT EYE;  Surgeon: [name], MD;  Location: [place];  Service: Ophthalmology;  Laterality: Left;  . SKIN CANCER EXCISION Left 2013   left ear skin ca removed  2 weeks ago - not completeely healed 08/15/11   . TONSILLECTOMY    . TOTAL KNEE ARTHROPLASTY  08/22/2011   Procedure: TOTAL KNEE ARTHROPLASTY;  Surgeon: Gearlean Alf, MD;  Location: [facility];  Service: Orthopedics;  Laterality: Right;    Home Medications:  Allergies as of 08/14/2019   No Known Allergies     Medication List       Accurate as of August 14, 2019 11:57 AM. If you have any questions, ask your nurse or doctor.        ALPRAZolam 0.5  MG tablet Commonly known as: XANAX Take 0.5 mg at bedtime by mouth. Anxiety   brimonidine 0.15 % ophthalmic solution Commonly known as: ALPHAGAN Place 1 drop into the left eye 2 (two) times daily.   CITRUCEL PO Take 1 capsule by mouth daily.   DIPHENHYDRAMINE COUGH PO Take by mouth.   Eliquis 5 MG Tabs tablet Generic drug: apixaban TAKE 1 TABLET BY MOUTH TWICE DAILY--MUST KEEP APPT FOR FUTURE REFILLS.   FIBER-CAPS PO Take by mouth.   finasteride 5 MG tablet Commonly known as: PROSCAR Take 1 tablet (5 mg total) by mouth daily with supper.   furosemide 20 MG tablet Commonly known as: LASIX Take 20 mg daily as needed by mouth (for fluid retention.).     lisinopril 10 MG tablet Commonly known as: ZESTRIL Take 0.5 tablets (5 mg total) by mouth daily.   lisinopril 5 MG tablet Commonly known as: ZESTRIL Take 5 mg by mouth daily.   metoprolol tartrate 25 MG tablet Commonly known as: LOPRESSOR Take 1 tablet (25 mg total) by mouth 2 (two) times daily.   nabumetone 750 MG tablet Commonly known as: RELAFEN Take 750 mg by mouth daily.   NON FORMULARY at bedtime. VPAP   potassium chloride 10 MEQ tablet Commonly known as: KLOR-CON Take 10 mEq daily as needed by mouth (with the lasix for fluid retention.). Take with Lasix   simvastatin 40 MG tablet Commonly known as: ZOCOR Take 40 mg by mouth at bedtime.   tamsulosin 0.4 MG Caps capsule Commonly known as: FLOMAX Take 1 capsule (0.4 mg total) by mouth See admin instructions. Take 1 capsule (0.4 mg) by mouth daily with supper (~1830) & take 1 capsule (0.4 mg) by mouth at bedtime.   TART CHERRY ADVANCED PO Take by mouth.       Allergies: No Known Allergies  Family History: Family History  Problem Relation Age of Onset  . Heart failure Mother        also arthritis  . Stroke Father        also emphysema  . Heart failure Father     Social History:  reports that he has never smoked. He has never used smokeless tobacco. He reports that he does not drink alcohol or use drugs.  ROS: All other review of systems were reviewed and are negative except what is noted above in HPI  Physical Exam: BP 123/77   Pulse (!) 56   Temp (!) 97.1 F (36.2 C)   Ht 5\' 8"  (1.727 m)   Wt 228 lb (103.4 kg)   BMI 34.67 kg/m   Constitutional:  Alert and oriented, No acute distress. HEENT: [place] AT, moist mucus membranes.  Trachea midline, no masses. Cardiovascular: No clubbing, cyanosis, or edema. Respiratory: Normal respiratory effort, no increased work of breathing. GI: Abdomen is soft, nontender, nondistended, no abdominal masses GU: No CVA tenderness Lymph: No cervical or inguinal  lymphadenopathy. Skin: No rashes, bruises or suspicious lesions. Neurologic: Grossly intact, no focal deficits, moving all 4 extremities. Psychiatric: Normal mood and affect.  Laboratory Data: Lab Results  Component Value Date   WBC 6.5 06/26/2018   HGB 12.2 (L) 06/26/2018   HCT 40.0 06/26/2018   MCV 92.8 06/26/2018   PLT 84 (L) 06/26/2018    Lab Results  Component Value Date   CREATININE 1.01 06/26/2018    No results found for: PSA  No results found for: TESTOSTERONE  No results found for: HGBA1C  Urinalysis    Component Value Date/Time  COLORURINE YELLOW 06/26/2018 1045   APPEARANCEUR HAZY (A) 06/26/2018 1045   LABSPEC 1.021 06/26/2018 1045   PHURINE 6.0 06/26/2018 1045   GLUCOSEU NEGATIVE 06/26/2018 1045   HGBUR SMALL (A) 06/26/2018 1045   BILIRUBINUR neg 08/14/2019 1151   KETONESUR NEGATIVE 06/26/2018 1045   PROTEINUR Negative 08/14/2019 1151   PROTEINUR NEGATIVE 06/26/2018 1045   UROBILINOGEN 0.2 08/14/2019 1151   UROBILINOGEN 0.2 08/15/2011 1248   NITRITE neg 08/14/2019 1151   NITRITE POSITIVE (A) 06/26/2018 1045   LEUKOCYTESUR Negative 08/14/2019 1151    Lab Results  Component Value Date   BACTERIA MANY (A) 06/26/2018    Pertinent Imaging:  No results found for this or any previous visit. No results found for this or any previous visit. No results found for this or any previous visit. No results found for this or any previous visit. No results found for this or any previous visit. No results found for this or any previous visit. No results found for this or any previous visit. No results found for this or any previous visit.  Assessment & Plan:    1. Benign localized prostatic hyperplasia with lower urinary tract symptoms (LUTS) -flomax 0.4mg  BID an fiansteride - BLADDER SCAN AMB NON-IMAGING - POCT urinalysis dipstick  2. Nocturia -continue flomax 0.4mg  BID and finasteride  3. Erectile dysfunction due to arterial insufficiency -patient  defers therapy at this time   No follow-ups on file.  Nicolette Bang, MD  [facility] [place]

## 2019-08-14 NOTE — Patient Instructions (Signed)

## 2019-08-21 ENCOUNTER — Ambulatory Visit: Payer: Medicare Other | Admitting: Urology

## 2019-08-26 ENCOUNTER — Other Ambulatory Visit: Payer: Self-pay | Admitting: Internal Medicine

## 2019-08-28 DIAGNOSIS — I739 Peripheral vascular disease, unspecified: Secondary | ICD-10-CM | POA: Diagnosis not present

## 2019-08-28 DIAGNOSIS — B351 Tinea unguium: Secondary | ICD-10-CM | POA: Diagnosis not present

## 2019-08-28 DIAGNOSIS — L110 Acquired keratosis follicularis: Secondary | ICD-10-CM | POA: Diagnosis not present

## 2019-09-02 DIAGNOSIS — L218 Other seborrheic dermatitis: Secondary | ICD-10-CM | POA: Diagnosis not present

## 2019-09-02 DIAGNOSIS — C44311 Basal cell carcinoma of skin of nose: Secondary | ICD-10-CM | POA: Diagnosis not present

## 2019-09-02 DIAGNOSIS — D485 Neoplasm of uncertain behavior of skin: Secondary | ICD-10-CM | POA: Diagnosis not present

## 2019-09-02 DIAGNOSIS — L570 Actinic keratosis: Secondary | ICD-10-CM | POA: Diagnosis not present

## 2019-09-03 ENCOUNTER — Ambulatory Visit: Payer: Medicare Other | Admitting: Nurse Practitioner

## 2019-09-12 DIAGNOSIS — J310 Chronic rhinitis: Secondary | ICD-10-CM | POA: Diagnosis not present

## 2019-09-12 DIAGNOSIS — J343 Hypertrophy of nasal turbinates: Secondary | ICD-10-CM | POA: Diagnosis not present

## 2019-09-19 DIAGNOSIS — C44311 Basal cell carcinoma of skin of nose: Secondary | ICD-10-CM | POA: Diagnosis not present

## 2019-09-25 ENCOUNTER — Other Ambulatory Visit: Payer: Self-pay | Admitting: Internal Medicine

## 2019-10-30 DIAGNOSIS — G4733 Obstructive sleep apnea (adult) (pediatric): Secondary | ICD-10-CM | POA: Diagnosis not present

## 2019-11-04 DIAGNOSIS — I129 Hypertensive chronic kidney disease with stage 1 through stage 4 chronic kidney disease, or unspecified chronic kidney disease: Secondary | ICD-10-CM | POA: Diagnosis not present

## 2019-11-04 DIAGNOSIS — N400 Enlarged prostate without lower urinary tract symptoms: Secondary | ICD-10-CM | POA: Diagnosis not present

## 2019-11-04 DIAGNOSIS — I4819 Other persistent atrial fibrillation: Secondary | ICD-10-CM | POA: Diagnosis not present

## 2019-11-04 DIAGNOSIS — I2510 Atherosclerotic heart disease of native coronary artery without angina pectoris: Secondary | ICD-10-CM | POA: Diagnosis not present

## 2019-11-11 ENCOUNTER — Other Ambulatory Visit: Payer: Self-pay

## 2019-11-11 ENCOUNTER — Encounter (INDEPENDENT_AMBULATORY_CARE_PROVIDER_SITE_OTHER): Payer: Medicare Other | Admitting: Ophthalmology

## 2019-11-11 DIAGNOSIS — H35373 Puckering of macula, bilateral: Secondary | ICD-10-CM | POA: Diagnosis not present

## 2019-11-11 DIAGNOSIS — H35033 Hypertensive retinopathy, bilateral: Secondary | ICD-10-CM

## 2019-11-11 DIAGNOSIS — H43813 Vitreous degeneration, bilateral: Secondary | ICD-10-CM

## 2019-11-11 DIAGNOSIS — D3131 Benign neoplasm of right choroid: Secondary | ICD-10-CM

## 2019-11-11 DIAGNOSIS — H338 Other retinal detachments: Secondary | ICD-10-CM

## 2019-11-11 DIAGNOSIS — I10 Essential (primary) hypertension: Secondary | ICD-10-CM | POA: Diagnosis not present

## 2019-11-13 DIAGNOSIS — I739 Peripheral vascular disease, unspecified: Secondary | ICD-10-CM | POA: Diagnosis not present

## 2019-11-13 DIAGNOSIS — B351 Tinea unguium: Secondary | ICD-10-CM | POA: Diagnosis not present

## 2019-11-13 DIAGNOSIS — L110 Acquired keratosis follicularis: Secondary | ICD-10-CM | POA: Diagnosis not present

## 2019-12-04 DIAGNOSIS — I2510 Atherosclerotic heart disease of native coronary artery without angina pectoris: Secondary | ICD-10-CM | POA: Diagnosis not present

## 2019-12-04 DIAGNOSIS — I129 Hypertensive chronic kidney disease with stage 1 through stage 4 chronic kidney disease, or unspecified chronic kidney disease: Secondary | ICD-10-CM | POA: Diagnosis not present

## 2019-12-04 DIAGNOSIS — N400 Enlarged prostate without lower urinary tract symptoms: Secondary | ICD-10-CM | POA: Diagnosis not present

## 2019-12-04 DIAGNOSIS — I4819 Other persistent atrial fibrillation: Secondary | ICD-10-CM | POA: Diagnosis not present

## 2019-12-19 ENCOUNTER — Other Ambulatory Visit: Payer: Self-pay

## 2019-12-19 DIAGNOSIS — N401 Enlarged prostate with lower urinary tract symptoms: Secondary | ICD-10-CM

## 2019-12-19 MED ORDER — TAMSULOSIN HCL 0.4 MG PO CAPS: 0.4000 mg | ORAL_CAPSULE | Freq: Two times a day (BID) | ORAL | 3 refills | Status: DC

## 2019-12-23 DIAGNOSIS — I2510 Atherosclerotic heart disease of native coronary artery without angina pectoris: Secondary | ICD-10-CM | POA: Diagnosis not present

## 2019-12-23 DIAGNOSIS — I10 Essential (primary) hypertension: Secondary | ICD-10-CM | POA: Diagnosis not present

## 2019-12-23 DIAGNOSIS — Z6835 Body mass index (BMI) 35.0-35.9, adult: Secondary | ICD-10-CM | POA: Diagnosis not present

## 2019-12-23 DIAGNOSIS — E785 Hyperlipidemia, unspecified: Secondary | ICD-10-CM | POA: Diagnosis not present

## 2019-12-23 DIAGNOSIS — Z0001 Encounter for general adult medical examination with abnormal findings: Secondary | ICD-10-CM | POA: Diagnosis not present

## 2019-12-23 DIAGNOSIS — Z1389 Encounter for screening for other disorder: Secondary | ICD-10-CM | POA: Diagnosis not present

## 2019-12-23 DIAGNOSIS — E6609 Other obesity due to excess calories: Secondary | ICD-10-CM | POA: Diagnosis not present

## 2019-12-31 DIAGNOSIS — G4733 Obstructive sleep apnea (adult) (pediatric): Secondary | ICD-10-CM | POA: Diagnosis not present

## 2020-01-03 DIAGNOSIS — N182 Chronic kidney disease, stage 2 (mild): Secondary | ICD-10-CM | POA: Diagnosis not present

## 2020-01-03 DIAGNOSIS — I2510 Atherosclerotic heart disease of native coronary artery without angina pectoris: Secondary | ICD-10-CM | POA: Diagnosis not present

## 2020-01-03 DIAGNOSIS — N400 Enlarged prostate without lower urinary tract symptoms: Secondary | ICD-10-CM | POA: Diagnosis not present

## 2020-01-03 DIAGNOSIS — I129 Hypertensive chronic kidney disease with stage 1 through stage 4 chronic kidney disease, or unspecified chronic kidney disease: Secondary | ICD-10-CM | POA: Diagnosis not present

## 2020-01-12 IMAGING — DX DG CHEST 2V
2 series · 2 of 2 positions shown · non-contrast
Comparison: 08/11/2016

CLINICAL DATA: Elevated heart rate with tremors beginning this
morning.

EXAM:
CHEST - 2 VIEW

[chest pa]
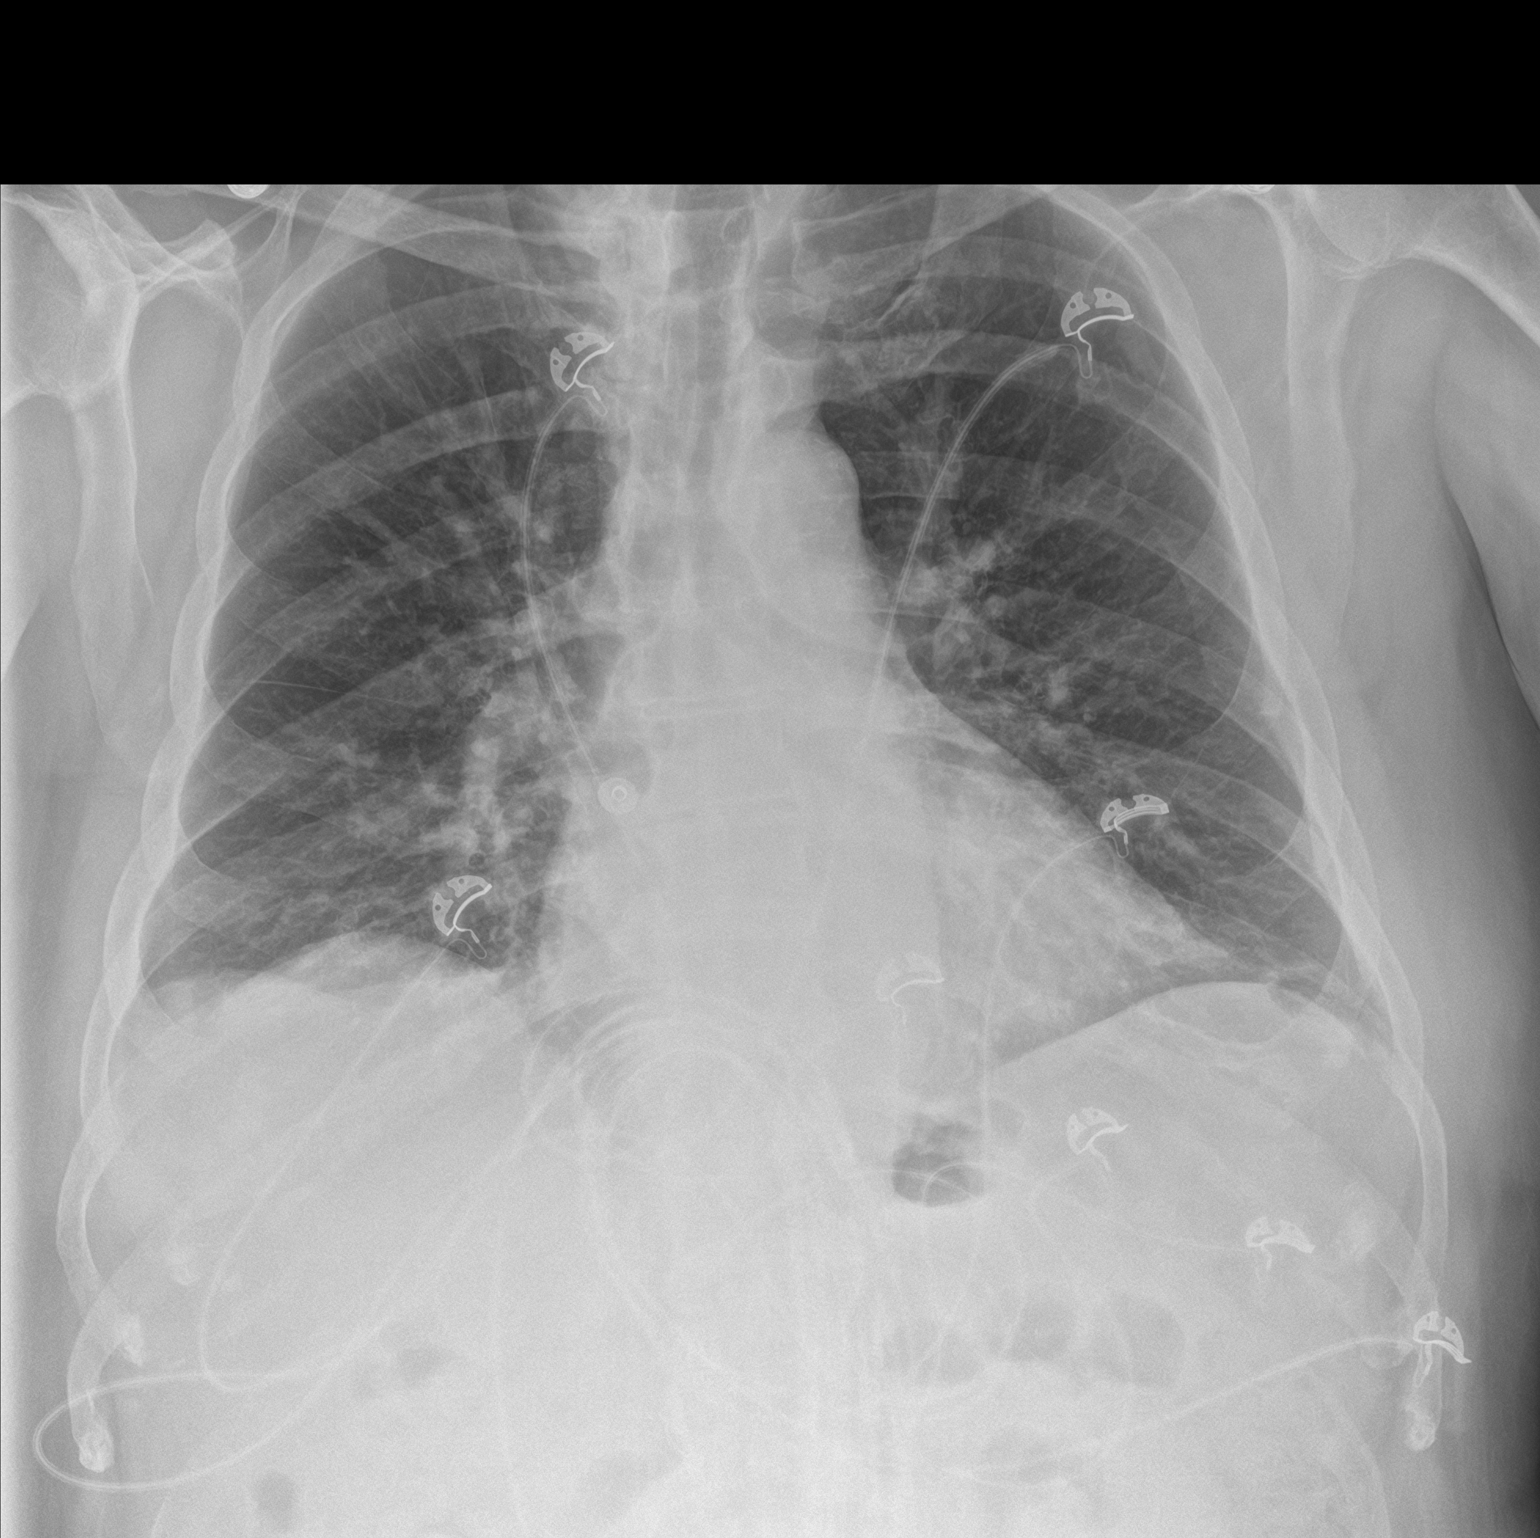

[chest lat]
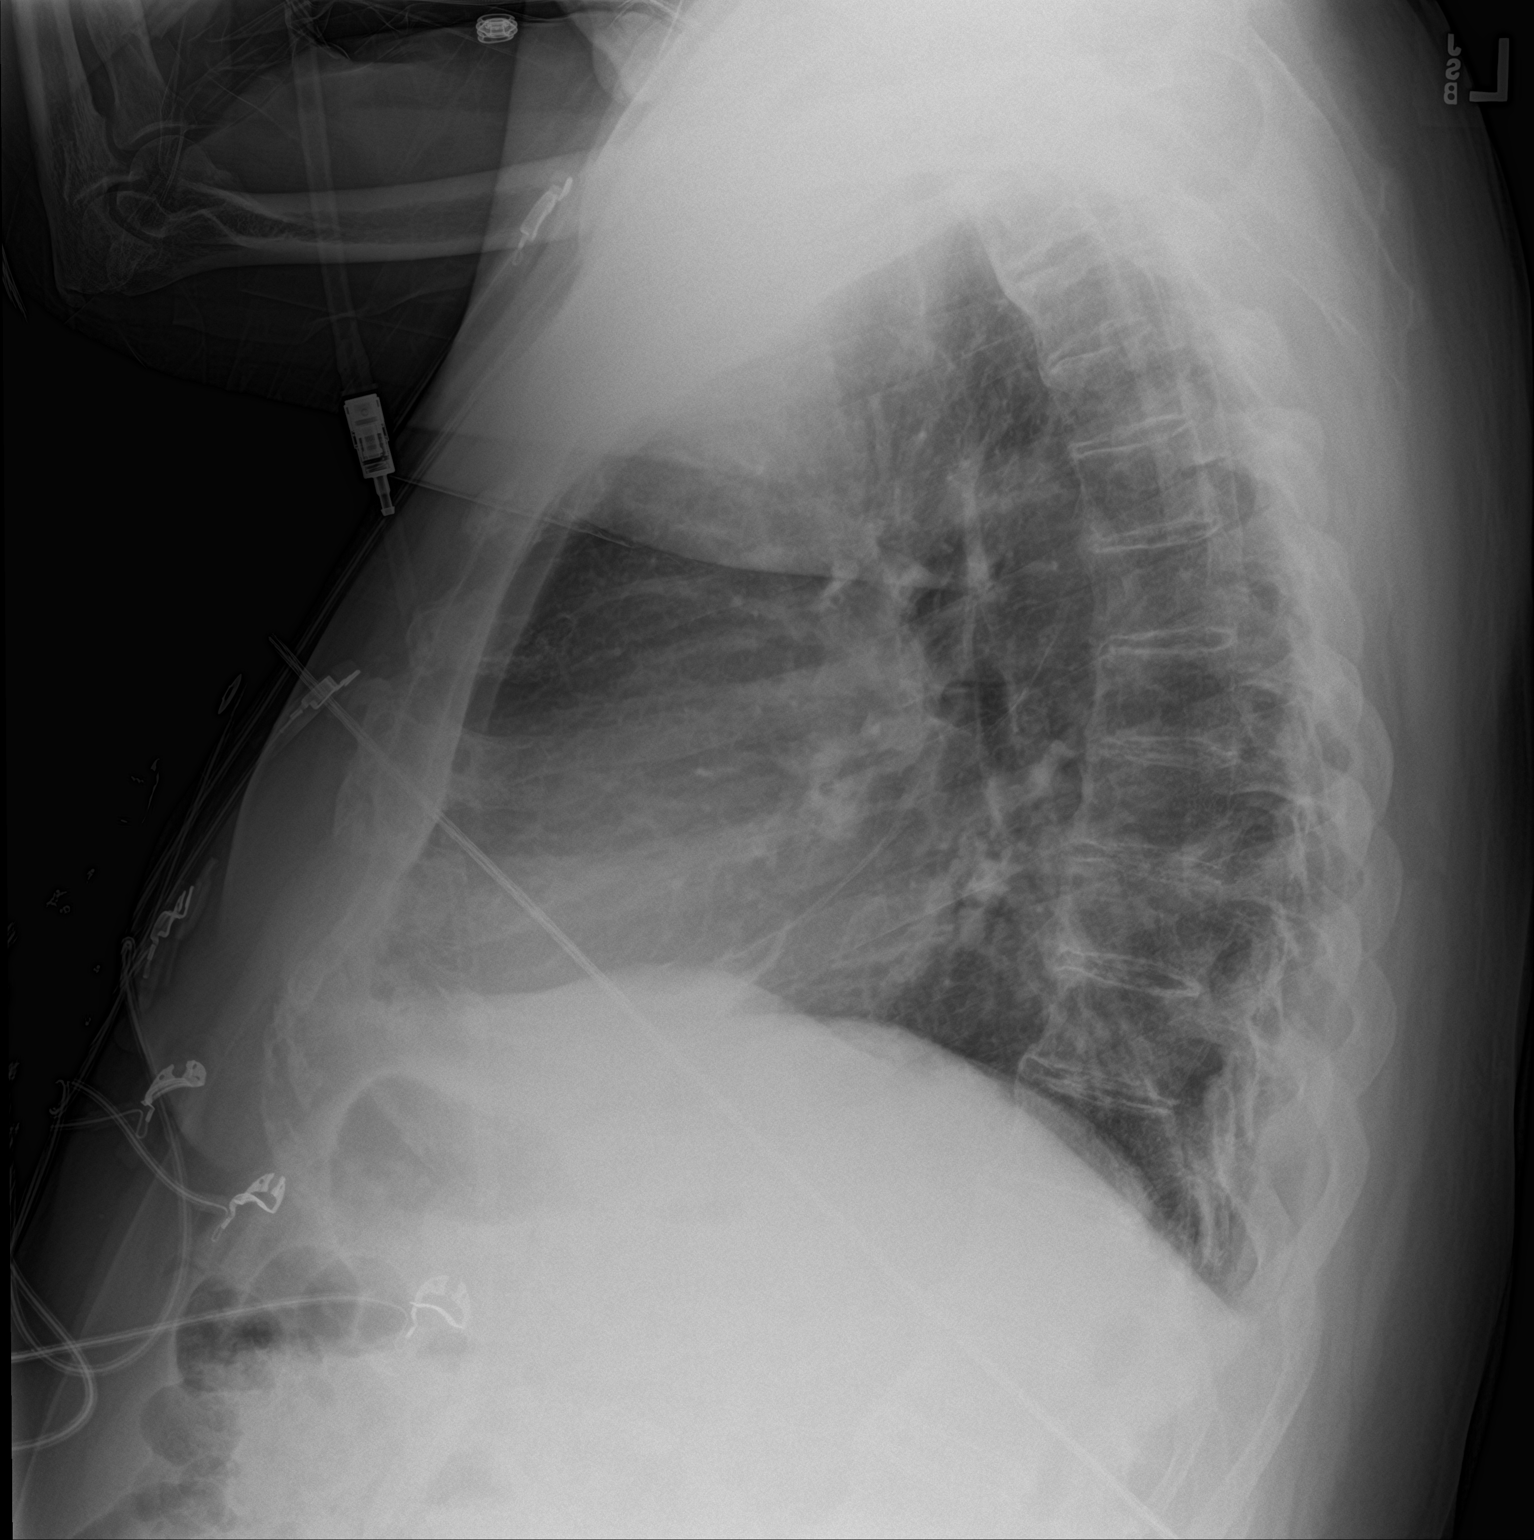

[2 of 2 positions shown; findings below may reference images not displayed]

FINDINGS: Lungs are adequately inflated without focal airspace consolidation
or effusion. Slight prominence of the perihilar markings. Stable
borderline cardiomegaly. Degenerative change of the spine.
IMPRESSION: No acute cardiopulmonary disease.

## 2020-01-13 DIAGNOSIS — Z0120 Encounter for dental examination and cleaning without abnormal findings: Secondary | ICD-10-CM | POA: Diagnosis not present

## 2020-01-21 DIAGNOSIS — I739 Peripheral vascular disease, unspecified: Secondary | ICD-10-CM | POA: Diagnosis not present

## 2020-01-21 DIAGNOSIS — B351 Tinea unguium: Secondary | ICD-10-CM | POA: Diagnosis not present

## 2020-01-21 DIAGNOSIS — L110 Acquired keratosis follicularis: Secondary | ICD-10-CM | POA: Diagnosis not present

## 2020-02-26 DIAGNOSIS — I4819 Other persistent atrial fibrillation: Secondary | ICD-10-CM | POA: Diagnosis not present

## 2020-02-26 DIAGNOSIS — I4891 Unspecified atrial fibrillation: Secondary | ICD-10-CM | POA: Diagnosis not present

## 2020-02-26 DIAGNOSIS — I5030 Unspecified diastolic (congestive) heart failure: Secondary | ICD-10-CM | POA: Diagnosis not present

## 2020-02-26 DIAGNOSIS — I2510 Atherosclerotic heart disease of native coronary artery without angina pectoris: Secondary | ICD-10-CM | POA: Diagnosis not present

## 2020-02-26 DIAGNOSIS — Z23 Encounter for immunization: Secondary | ICD-10-CM | POA: Diagnosis not present

## 2020-02-26 DIAGNOSIS — Z6836 Body mass index (BMI) 36.0-36.9, adult: Secondary | ICD-10-CM | POA: Diagnosis not present

## 2020-03-03 ENCOUNTER — Other Ambulatory Visit (HOSPITAL_COMMUNITY)
Admission: RE | Admit: 2020-03-03 | Discharge: 2020-03-03 | Disposition: A | Discharge status: Home / Self Care | Payer: Medicare Other | Source: Ambulatory Visit | Attending: Physician Assistant | Admitting: Physician Assistant

## 2020-03-03 ENCOUNTER — Other Ambulatory Visit: Payer: Self-pay

## 2020-03-03 ENCOUNTER — Inpatient Hospital Stay (HOSPITAL_COMMUNITY)
Admission: EM | Admit: 2020-03-03 | Discharge: 2020-03-07 | DRG: 377 | Disposition: A | Discharge status: Home / Self Care | Payer: Medicare Other | Attending: Family Medicine | Admitting: Family Medicine

## 2020-03-03 ENCOUNTER — Inpatient Hospital Stay (HOSPITAL_COMMUNITY): Payer: Medicare Other

## 2020-03-03 ENCOUNTER — Ambulatory Visit: Payer: Medicare Other | Admitting: Physician Assistant

## 2020-03-03 ENCOUNTER — Emergency Department (HOSPITAL_COMMUNITY): Payer: Medicare Other

## 2020-03-03 ENCOUNTER — Telehealth: Payer: Self-pay | Admitting: Internal Medicine

## 2020-03-03 ENCOUNTER — Encounter: Payer: Self-pay | Admitting: Physician Assistant

## 2020-03-03 ENCOUNTER — Encounter (HOSPITAL_COMMUNITY): Payer: Self-pay | Admitting: *Deleted

## 2020-03-03 ENCOUNTER — Encounter: Payer: Self-pay | Admitting: *Deleted

## 2020-03-03 VITALS — BP 118/58 | HR 84 | Ht 68.0 in | Wt 232.0 lb

## 2020-03-03 DIAGNOSIS — I952 Hypotension due to drugs: Secondary | ICD-10-CM | POA: Diagnosis not present

## 2020-03-03 DIAGNOSIS — K449 Diaphragmatic hernia without obstruction or gangrene: Secondary | ICD-10-CM | POA: Diagnosis not present

## 2020-03-03 DIAGNOSIS — I110 Hypertensive heart disease with heart failure: Secondary | ICD-10-CM | POA: Diagnosis not present

## 2020-03-03 DIAGNOSIS — E7849 Other hyperlipidemia: Secondary | ICD-10-CM | POA: Diagnosis not present

## 2020-03-03 DIAGNOSIS — D500 Iron deficiency anemia secondary to blood loss (chronic): Secondary | ICD-10-CM | POA: Diagnosis not present

## 2020-03-03 DIAGNOSIS — K648 Other hemorrhoids: Secondary | ICD-10-CM | POA: Diagnosis present

## 2020-03-03 DIAGNOSIS — K2289 Other specified disease of esophagus: Secondary | ICD-10-CM | POA: Diagnosis not present

## 2020-03-03 DIAGNOSIS — Z8249 Family history of ischemic heart disease and other diseases of the circulatory system: Secondary | ICD-10-CM

## 2020-03-03 DIAGNOSIS — I350 Nonrheumatic aortic (valve) stenosis: Secondary | ICD-10-CM | POA: Diagnosis not present

## 2020-03-03 DIAGNOSIS — Z20822 Contact with and (suspected) exposure to covid-19: Secondary | ICD-10-CM | POA: Diagnosis not present

## 2020-03-03 DIAGNOSIS — I480 Paroxysmal atrial fibrillation: Secondary | ICD-10-CM | POA: Diagnosis present

## 2020-03-03 DIAGNOSIS — R600 Localized edema: Secondary | ICD-10-CM | POA: Diagnosis not present

## 2020-03-03 DIAGNOSIS — I351 Nonrheumatic aortic (valve) insufficiency: Secondary | ICD-10-CM | POA: Diagnosis not present

## 2020-03-03 DIAGNOSIS — R251 Tremor, unspecified: Secondary | ICD-10-CM | POA: Diagnosis not present

## 2020-03-03 DIAGNOSIS — I2510 Atherosclerotic heart disease of native coronary artery without angina pectoris: Secondary | ICD-10-CM | POA: Diagnosis not present

## 2020-03-03 DIAGNOSIS — Z6834 Body mass index (BMI) 34.0-34.9, adult: Secondary | ICD-10-CM | POA: Diagnosis not present

## 2020-03-03 DIAGNOSIS — I951 Orthostatic hypotension: Secondary | ICD-10-CM | POA: Diagnosis not present

## 2020-03-03 DIAGNOSIS — E785 Hyperlipidemia, unspecified: Secondary | ICD-10-CM

## 2020-03-03 DIAGNOSIS — I959 Hypotension, unspecified: Secondary | ICD-10-CM

## 2020-03-03 DIAGNOSIS — E6609 Other obesity due to excess calories: Secondary | ICD-10-CM | POA: Diagnosis not present

## 2020-03-03 DIAGNOSIS — K228 Other specified diseases of esophagus: Secondary | ICD-10-CM | POA: Diagnosis not present

## 2020-03-03 DIAGNOSIS — T501X5A Adverse effect of loop [high-ceiling] diuretics, initial encounter: Secondary | ICD-10-CM | POA: Diagnosis present

## 2020-03-03 DIAGNOSIS — I10 Essential (primary) hypertension: Secondary | ICD-10-CM | POA: Diagnosis present

## 2020-03-03 DIAGNOSIS — D649 Anemia, unspecified: Secondary | ICD-10-CM | POA: Diagnosis present

## 2020-03-03 DIAGNOSIS — G4733 Obstructive sleep apnea (adult) (pediatric): Secondary | ICD-10-CM | POA: Diagnosis not present

## 2020-03-03 DIAGNOSIS — N400 Enlarged prostate without lower urinary tract symptoms: Secondary | ICD-10-CM | POA: Diagnosis not present

## 2020-03-03 DIAGNOSIS — R42 Dizziness and giddiness: Secondary | ICD-10-CM | POA: Diagnosis not present

## 2020-03-03 DIAGNOSIS — K635 Polyp of colon: Secondary | ICD-10-CM | POA: Diagnosis present

## 2020-03-03 DIAGNOSIS — I340 Nonrheumatic mitral (valve) insufficiency: Secondary | ICD-10-CM | POA: Diagnosis not present

## 2020-03-03 DIAGNOSIS — I5031 Acute diastolic (congestive) heart failure: Secondary | ICD-10-CM

## 2020-03-03 DIAGNOSIS — K922 Gastrointestinal hemorrhage, unspecified: Principal | ICD-10-CM | POA: Diagnosis present

## 2020-03-03 DIAGNOSIS — R410 Disorientation, unspecified: Secondary | ICD-10-CM | POA: Diagnosis not present

## 2020-03-03 DIAGNOSIS — K219 Gastro-esophageal reflux disease without esophagitis: Secondary | ICD-10-CM | POA: Diagnosis not present

## 2020-03-03 DIAGNOSIS — F419 Anxiety disorder, unspecified: Secondary | ICD-10-CM | POA: Diagnosis present

## 2020-03-03 DIAGNOSIS — Z85828 Personal history of other malignant neoplasm of skin: Secondary | ICD-10-CM | POA: Diagnosis not present

## 2020-03-03 DIAGNOSIS — I4891 Unspecified atrial fibrillation: Secondary | ICD-10-CM | POA: Diagnosis not present

## 2020-03-03 DIAGNOSIS — Z96651 Presence of right artificial knee joint: Secondary | ICD-10-CM | POA: Diagnosis present

## 2020-03-03 DIAGNOSIS — Z7901 Long term (current) use of anticoagulants: Secondary | ICD-10-CM

## 2020-03-03 DIAGNOSIS — Z823 Family history of stroke: Secondary | ICD-10-CM | POA: Diagnosis not present

## 2020-03-03 DIAGNOSIS — M069 Rheumatoid arthritis, unspecified: Secondary | ICD-10-CM | POA: Diagnosis present

## 2020-03-03 DIAGNOSIS — E669 Obesity, unspecified: Secondary | ICD-10-CM | POA: Diagnosis present

## 2020-03-03 DIAGNOSIS — I482 Chronic atrial fibrillation, unspecified: Secondary | ICD-10-CM | POA: Diagnosis present

## 2020-03-03 DIAGNOSIS — Z87442 Personal history of urinary calculi: Secondary | ICD-10-CM

## 2020-03-03 DIAGNOSIS — T50995A Adverse effect of other drugs, medicaments and biological substances, initial encounter: Secondary | ICD-10-CM | POA: Diagnosis not present

## 2020-03-03 DIAGNOSIS — I5033 Acute on chronic diastolic (congestive) heart failure: Secondary | ICD-10-CM

## 2020-03-03 DIAGNOSIS — I4821 Permanent atrial fibrillation: Secondary | ICD-10-CM

## 2020-03-03 DIAGNOSIS — S8011XA Contusion of right lower leg, initial encounter: Secondary | ICD-10-CM | POA: Diagnosis not present

## 2020-03-03 DIAGNOSIS — K5730 Diverticulosis of large intestine without perforation or abscess without bleeding: Secondary | ICD-10-CM | POA: Diagnosis not present

## 2020-03-03 DIAGNOSIS — M7989 Other specified soft tissue disorders: Secondary | ICD-10-CM | POA: Diagnosis not present

## 2020-03-03 DIAGNOSIS — I509 Heart failure, unspecified: Secondary | ICD-10-CM | POA: Diagnosis not present

## 2020-03-03 DIAGNOSIS — Z79899 Other long term (current) drug therapy: Secondary | ICD-10-CM

## 2020-03-03 DIAGNOSIS — I517 Cardiomegaly: Secondary | ICD-10-CM | POA: Diagnosis not present

## 2020-03-03 HISTORY — DX: Unspecified atrial fibrillation: I48.91

## 2020-03-03 LAB — BASIC METABOLIC PANEL
Anion gap: 8 (ref 5–15)
BUN: 28 mg/dL — ABNORMAL HIGH (ref 8–23)
CO2: 28 mmol/L (ref 22–32)
Calcium: 8.7 mg/dL — ABNORMAL LOW (ref 8.9–10.3)
Chloride: 102 mmol/L (ref 98–111)
Creatinine, Ser: 1.16 mg/dL (ref 0.61–1.24)
GFR calc Af Amer: >60 mL/min (ref >60)
GFR calc non Af Amer: >60 mL/min (ref >60)
Glucose, Bld: 105 mg/dL — ABNORMAL HIGH (ref 70–99)
Potassium: 4.3 mmol/L (ref 3.5–5.1)
Sodium: 138 mmol/L (ref 135–145)

## 2020-03-03 LAB — COMPREHENSIVE METABOLIC PANEL
ALT: 10 U/L (ref 0–44)
AST: 14 U/L — ABNORMAL LOW (ref 15–41)
Albumin: 3.5 g/dL (ref 3.5–5.0)
Alkaline Phosphatase: 79 U/L (ref 38–126)
Anion gap: 9 (ref 5–15)
BUN: 28 mg/dL — ABNORMAL HIGH (ref 8–23)
CO2: 27 mmol/L (ref 22–32)
Calcium: 8.5 mg/dL — ABNORMAL LOW (ref 8.9–10.3)
Chloride: 101 mmol/L (ref 98–111)
Creatinine, Ser: 1.16 mg/dL (ref 0.61–1.24)
GFR calc Af Amer: >60 mL/min (ref >60)
GFR calc non Af Amer: >60 mL/min (ref >60)
Glucose, Bld: 114 mg/dL — ABNORMAL HIGH (ref 70–99)
Potassium: 4 mmol/L (ref 3.5–5.1)
Sodium: 137 mmol/L (ref 135–145)
Total Bilirubin: 0.8 mg/dL (ref 0.3–1.2)
Total Protein: 6.3 g/dL — ABNORMAL LOW (ref 6.5–8.1)

## 2020-03-03 LAB — CBC WITH DIFFERENTIAL/PLATELET
Abs Immature Granulocytes: 0.01 10*3/uL (ref 0.00–0.07)
Basophils Absolute: 0 10*3/uL (ref 0.0–0.1)
Basophils Relative: 1 %
Eosinophils Absolute: 0.1 10*3/uL (ref 0.0–0.5)
Eosinophils Relative: 2 %
HCT: 27.6 % — ABNORMAL LOW (ref 39.0–52.0)
Hemoglobin: 7.9 g/dL — ABNORMAL LOW (ref 13.0–17.0)
Immature Granulocytes: 0 %
Lymphocytes Relative: 26 %
Lymphs Abs: 1.4 10*3/uL (ref 0.7–4.0)
MCH: 24.6 pg — ABNORMAL LOW (ref 26.0–34.0)
MCHC: 28.6 g/dL — ABNORMAL LOW (ref 30.0–36.0)
MCV: 86 fL (ref 80.0–100.0)
Monocytes Absolute: 0.6 10*3/uL (ref 0.1–1.0)
Monocytes Relative: 11 %
Neutro Abs: 3.1 10*3/uL (ref 1.7–7.7)
Neutrophils Relative %: 60 %
Platelets: 152 10*3/uL (ref 150–400)
RBC: 3.21 MIL/uL — ABNORMAL LOW (ref 4.22–5.81)
RDW: 15 % (ref 11.5–15.5)
WBC: 5.2 10*3/uL (ref 4.0–10.5)
nRBC: 0 % (ref 0.0–0.2)

## 2020-03-03 LAB — CBC
HCT: 27.4 % — ABNORMAL LOW (ref 39.0–52.0)
Hemoglobin: 7.9 g/dL — ABNORMAL LOW (ref 13.0–17.0)
MCH: 24.8 pg — ABNORMAL LOW (ref 26.0–34.0)
MCHC: 28.8 g/dL — ABNORMAL LOW (ref 30.0–36.0)
MCV: 86.2 fL (ref 80.0–100.0)
Platelets: 148 10*3/uL — ABNORMAL LOW (ref 150–400)
RBC: 3.18 MIL/uL — ABNORMAL LOW (ref 4.22–5.81)
RDW: 15 % (ref 11.5–15.5)
WBC: 5.3 10*3/uL (ref 4.0–10.5)
nRBC: 0 % (ref 0.0–0.2)

## 2020-03-03 LAB — POC OCCULT BLOOD, ED: Fecal Occult Bld: POSITIVE — AB

## 2020-03-03 LAB — RESPIRATORY PANEL BY RT PCR (FLU A&B, COVID)
Influenza A by PCR: NEGATIVE
Influenza B by PCR: NEGATIVE
SARS Coronavirus 2 by RT PCR: NEGATIVE

## 2020-03-03 LAB — BRAIN NATRIURETIC PEPTIDE: B Natriuretic Peptide: 117 pg/mL — ABNORMAL HIGH (ref 0.0–100.0)

## 2020-03-03 LAB — PREPARE RBC (CROSSMATCH)

## 2020-03-03 MED ORDER — ONDANSETRON HCL 4 MG PO TABS: 4.0000 mg | ORAL_TABLET | Freq: Four times a day (QID) | ORAL | Status: DC | PRN

## 2020-03-03 MED ORDER — TAMSULOSIN HCL 0.4 MG PO CAPS: 0.4000 mg | ORAL_CAPSULE | Freq: Two times a day (BID) | ORAL | Status: DC | PRN

## 2020-03-03 MED ORDER — SODIUM CHLORIDE 0.9 % IV SOLN: 250.0000 mL | INTRAVENOUS | Status: DC | PRN

## 2020-03-03 MED ORDER — PANTOPRAZOLE SODIUM 40 MG IV SOLR
40.0000 mg | Freq: Once | INTRAVENOUS | Status: AC
  Administered 2020-03-03: 40 mg via INTRAVENOUS
  Filled 2020-03-03: qty 40

## 2020-03-03 MED ORDER — SODIUM CHLORIDE 0.9% FLUSH: 3.0000 mL | INTRAVENOUS | Status: DC | PRN

## 2020-03-03 MED ORDER — SODIUM CHLORIDE 0.9 % IV SOLN
10.0000 mL/h | Freq: Once | INTRAVENOUS | Status: AC
  Administered 2020-03-03: 10 mL/h via INTRAVENOUS

## 2020-03-03 MED ORDER — FINASTERIDE 5 MG PO TABS
5.0000 mg | ORAL_TABLET | Freq: Every day | ORAL | Status: DC
  Administered 2020-03-04 – 2020-03-06 (×3): 5 mg via ORAL
  Filled 2020-03-03 (×5): qty 1

## 2020-03-03 MED ORDER — FUROSEMIDE 40 MG PO TABS: 40.0000 mg | ORAL_TABLET | Freq: Every day | ORAL | 6 refills | Status: DC

## 2020-03-03 MED ORDER — ONDANSETRON HCL 4 MG/2ML IJ SOLN
4.0000 mg | Freq: Four times a day (QID) | INTRAMUSCULAR | Status: DC | PRN
  Administered 2020-03-05: 4 mg via INTRAVENOUS
  Filled 2020-03-03: qty 2

## 2020-03-03 MED ORDER — BRIMONIDINE TARTRATE 0.15 % OP SOLN
1.0000 [drp] | Freq: Two times a day (BID) | OPHTHALMIC | Status: DC
  Administered 2020-03-04 – 2020-03-07 (×7): 1 [drp] via OPHTHALMIC
  Filled 2020-03-03: qty 5

## 2020-03-03 MED ORDER — POTASSIUM CHLORIDE CRYS ER 20 MEQ PO TBCR
40.0000 meq | EXTENDED_RELEASE_TABLET | Freq: Every day | ORAL | Status: DC
  Administered 2020-03-04 – 2020-03-07 (×4): 40 meq via ORAL
  Filled 2020-03-03 (×4): qty 2

## 2020-03-03 MED ORDER — SIMVASTATIN 20 MG PO TABS
40.0000 mg | ORAL_TABLET | Freq: Every day | ORAL | Status: DC
  Administered 2020-03-04 – 2020-03-06 (×3): 40 mg via ORAL
  Filled 2020-03-03 (×4): qty 2

## 2020-03-03 MED ORDER — ALPRAZOLAM 0.5 MG PO TABS
0.5000 mg | ORAL_TABLET | Freq: Every day | ORAL | Status: DC
  Administered 2020-03-03 – 2020-03-06 (×4): 0.5 mg via ORAL
  Filled 2020-03-03 (×4): qty 1

## 2020-03-03 MED ORDER — ACETAMINOPHEN 325 MG PO TABS
650.0000 mg | ORAL_TABLET | Freq: Four times a day (QID) | ORAL | Status: DC | PRN
  Administered 2020-03-06: 650 mg via ORAL
  Filled 2020-03-03: qty 2

## 2020-03-03 MED ORDER — DILTIAZEM HCL 25 MG/5ML IV SOLN
5.0000 mg | Freq: Once | INTRAVENOUS | Status: AC | PRN
  Administered 2020-03-04: 5 mg via INTRAVENOUS
  Filled 2020-03-03: qty 5

## 2020-03-03 MED ORDER — ACETAMINOPHEN 650 MG RE SUPP: 650.0000 mg | Freq: Four times a day (QID) | RECTAL | Status: DC | PRN

## 2020-03-03 MED ORDER — SODIUM CHLORIDE 0.9% FLUSH
3.0000 mL | Freq: Two times a day (BID) | INTRAVENOUS | Status: DC
  Administered 2020-03-03 – 2020-03-07 (×8): 3 mL via INTRAVENOUS

## 2020-03-03 NOTE — ED Provider Notes (Addendum)
[facility] EMERGENCY DEPARTMENT Provider Note   CSN: [id number] Arrival date & time: 03/03/20  1509     History Chief Complaint  Patient presents with  . Abnormal Lab    Paul Lam is a 79 y.o. male with history of nonobstructive CAD on cath in 2007, EF 50 to 50%, hypertension, paroxysmal atrial fibrillation on Eliquis, hyperlipidemia, sleep apnea on CPAP presents to the ER from cardiology office for evaluation of abnormal lab work that was done in the office.  Saw cardiology this morning and was told his hemoglobin was 7.9, he was referred to the ER.  He had gone to the cardiologist for evaluation of ongoing leg swelling, bilateral, despite increasing his Lasix from 20 mg BID to 40 mg BID last Thursday by PCP.  Has also noticed that his blood pressure has been low with systolics in the 18E-99B.  He reports dizziness when bending forward, sitting and standing.  States "everything goes black" and he has to hold onto something so he does not fall out.  Admits that he has had 2 falls due to the dizziness in the last month.  No head injury. Reports worsening fatigue, low energy.  Also has had very dark stools, formed.  Not melena but "almost".  Reports ongoing leg swelling with tightness and pins-and-needles discomfort bilaterally.  Has been compliant with all of his medicines.  Denies any chest pain or shortness of breath.  Denies palpitations.  No syncope.  No known GI bleed in the past. HPI     Past Medical History:  Diagnosis Date  . Anginal pain ([place])   . Anxiety   . Arthritis    generalized arthritis   . Atrial fibrillation ([place])   . CAD (coronary artery disease)   . Cancer ([place])    skin cancer removed left ear    . Complication of anesthesia   . Depression   . Headache   . History of echocardiogram 02/19/2009   EF 50-55%; LA mild-mod dilated;   . History of kidney stones   . History of nuclear stress test 02/19/2009   low risk, normal   . Hyperlipidemia   . Hypertension    . Obesity (BMI 30-39.9)   . OSA (obstructive sleep apnea)    severe with AHI 25/hr now on BiPAP  . PONV (postoperative nausea and vomiting)   . Retinal detachment    with proliferative vitreoretinopathy  . Shortness of breath    related to weight   . Sleep apnea   . Vertigo     Patient Active Problem List   Diagnosis Date Noted  . Symptomatic anemia 03/03/2020  . Benign localized prostatic hyperplasia with lower urinary tract symptoms (LUTS) 08/14/2019  . Nocturia 08/14/2019  . Erectile dysfunction due to arterial insufficiency 08/14/2019  . Acute lower UTI   . Atrial fibrillation, chronic ([place]) 06/26/2018  . Fever 06/26/2018  . Thrombocytopenia ([place]) 06/26/2018  . Atrial fibrillation with RVR ([place]) 06/25/2018  . S/P nasal septoplasty 12/19/2017  . Rhegmatogenous retinal detachment of left eye 05/22/2017  . Vertigo 01/02/2015  . Obesity (BMI 30-39.9)   . Symptomatic bradycardia 01/02/2014  . OSA (obstructive sleep apnea)   . Hypertension   . Anxiety 12/27/2012  . Morbid obesity due to excess calories ([place]) 12/27/2012  . Dizziness 12/27/2012  . Stiffness of knee joint, right 10/11/2011  . Difficulty in walking(719.7) 10/11/2011  . Pain due to total knee replacement ([place]) 10/11/2011  . Postop Hyponatremia 08/23/2011  . OA (osteoarthritis)  of knee 08/22/2011    Past Surgical History:  Procedure Laterality Date  . APPENDECTOMY    . CARDIAC CATHETERIZATION  07/13/2005   noncritical CAD  . CARDIOVERSION N/A 01/29/2018   Procedure: CARDIOVERSION;  Surgeon: Dorothy Spark, MD;  Location: [facility] ENDOSCOPY;  Service: Cardiovascular;  Laterality: N/A;  . EYE SURGERY    . FLEXIBLE SIGMOIDOSCOPY N/A 07/14/2015   Procedure: FLEXIBLE SIGMOIDOSCOPY;  Surgeon: Aviva Signs, MD;  Location: [facility] ENDO SUITE;  Service: Gastroenterology;  Laterality: N/A;  . JOINT REPLACEMENT    . NASAL SEPTOPLASTY W/ TURBINOPLASTY Bilateral 12/19/2017   Procedure: NASAL SEPTOPLASTY WITH TURBINATE REDUCTION;   Surgeon: [name], MD;  Location: [place];  Service: ENT;  Laterality: Bilateral;  . NASAL SINUS SURGERY    . PARS PLANA VITRECTOMY Left 06/20/2017   Procedure: PARS PLANA VITRECTOMY WITH 25 GAUGE, REPAIR OF COMPLEX TRACTION RETINAL DETACHMENT, MEMBRANE PEEEL;  Surgeon: [name], MD;  Location: [place];  Service: Ophthalmology;  Laterality: Left;  . REPAIR OF COMPLEX TRACTION RETINAL DETACHMENT Left 06/20/2017  . RETINAL DETACHMENT SURGERY Right 04/2011  . SCLERAL BUCKLE WITH POSSIBLE 25 GAUGE PARS PLANA VITRECTOMY Left 05/22/2017   Procedure: SCLERAL BUCKLE, LASER, GAS INJECTION LEFT EYE;  Surgeon: [name], MD;  Location: [place];  Service: Ophthalmology;  Laterality: Left;  . SKIN CANCER EXCISION Left 2013   left ear skin ca removed  2 weeks ago - not completeely healed 08/15/11   . TONSILLECTOMY    . TOTAL KNEE ARTHROPLASTY  08/22/2011   Procedure: TOTAL KNEE ARTHROPLASTY;  Surgeon: Gearlean Alf, MD;  Location: [facility];  Service: Orthopedics;  Laterality: Right;       Family History  Problem Relation Age of Onset  . Heart failure Mother        also arthritis  . Stroke Father        also emphysema  . Heart failure Father     Social History   Tobacco Use  . Smoking status: Never Smoker  . Smokeless tobacco: Never Used  Vaping Use  . Vaping Use: Never used  Substance Use Topics  . Alcohol use: No  . Drug use: No    Home Medications Prior to Admission medications   Medication Sig Start Date End Date Taking? Authorizing Provider  ALPRAZolam Duanne Moron) 0.5 MG tablet Take 0.5 mg at bedtime by mouth. Anxiety    Yes [provider]  Ascorbic Acid (VITAMIN C WITH ROSE HIPS) 500 MG tablet Take 500 mg by mouth daily.   Yes [provider]  brimonidine (ALPHAGAN) 0.15 % ophthalmic solution Place 1 drop into the left eye 2 (two) times daily. 12/23/17  Yes [provider]  Calcium Polycarbophil (FIBER-CAPS PO) Take by mouth.   Yes  [provider]  Cholecalciferol (VITAMIN D3 PO) Take by mouth.   Yes [provider]  docusate sodium (COLACE) 100 MG capsule Take 100 mg by mouth 2 (two) times daily.   Yes [provider]  ELIQUIS 5 MG TABS tablet TAKE (1) TABLET TWICE DAILY. 09/25/19  Yes Hilty, Nadean Corwin, MD  finasteride (PROSCAR) 5 MG tablet Take 1 tablet (5 mg total) by mouth daily with supper. 08/14/19  Yes McKenzie, Candee Furbish, MD  furosemide (LASIX) 40 MG tablet Take 1 tablet (40 mg total) by mouth daily. 03/03/20  Yes Imogene Burn, PA-C  lisinopril (ZESTRIL) 5 MG tablet Take 5 mg by mouth daily. 06/21/19  Yes [provider]  metoprolol tartrate (LOPRESSOR) 25 MG tablet Take 1 tablet (25 mg total) by mouth 2 (two) times daily. 06/27/18  Yes Barton Dubois, MD  Misc Natural Products (TART CHERRY ADVANCED PO) Take 1 tablet by mouth daily.    Yes [provider]  nabumetone (RELAFEN) 750 MG tablet Take 750 mg by mouth daily.    Yes [provider]  NON FORMULARY at bedtime. VPAP   Yes [provider]  potassium chloride (K-DUR) 10 MEQ tablet Take 10 mEq daily as needed by mouth (with the lasix for fluid retention.). Take with Lasix 12/31/14  Yes [provider]  simvastatin (ZOCOR) 40 MG tablet Take 40 mg by mouth at bedtime.   Yes [provider]  tamsulosin (FLOMAX) 0.4 MG CAPS capsule Take 1 capsule (0.4 mg total) by mouth in the morning and at bedtime. Take 1 capsule (0.4 mg) by mouth daily with supper (~1830) & take 1 capsule (0.4 mg) by mouth at bedtime. 12/19/19  Yes McKenzie, Candee Furbish, MD  Zinc 50 MG TABS Take by mouth.   Yes [provider]    Allergies    Patient has no known allergies.  Review of Systems   Review of Systems  Constitutional: Positive for fatigue.  Gastrointestinal:       Dark stools   Neurological: Positive for dizziness.  Hematological: Bruises/bleeds easily.  All other systems reviewed and are  negative.   Physical Exam Updated Vital Signs BP (!) 95/53   Pulse 64   Temp 98.8 F (37.1 C) (Oral)   Resp 18   Ht 5\' 8"  (1.727 m)   Wt 102.7 kg   SpO2 100%   BMI 34.44 kg/m   Physical Exam Vitals and nursing note reviewed.  Constitutional:      General: He is not in acute distress.    Appearance: He is well-developed.     Comments: NAD. Laying in hall bed.   HENT:     Head: Normocephalic and atraumatic.     Right Ear: External ear normal.     Left Ear: External ear normal.     Nose: Nose normal.  Eyes:     General: No scleral icterus.    Conjunctiva/sclera: Conjunctivae normal.  Cardiovascular:     Rate and Rhythm: Normal rate. Rhythm irregular.     Heart sounds: Normal heart sounds.     Comments: Irregularly irregular  Pulmonary:     Effort: Pulmonary effort is normal.     Breath sounds: Normal breath sounds.  Abdominal:     Palpations: Abdomen is soft.     Tenderness: There is no abdominal tenderness.  Musculoskeletal:        General: No deformity. Normal range of motion.     Cervical back: Normal range of motion and neck supple.  Skin:    General: Skin is warm and dry.     Capillary Refill: Capillary refill takes less than 2 seconds.  Neurological:     Mental Status: He is alert and oriented to person, place, and time.  Psychiatric:        Behavior: Behavior normal.        Thought Content: Thought content normal.        Judgment: Judgment normal.     ED Results / Procedures / Treatments   Labs (all labs ordered are listed, but only abnormal results are displayed) Labs Reviewed  CBC WITH DIFFERENTIAL/PLATELET - Abnormal; Notable for the following components:      Result Value  RBC 3.21 (*)    Hemoglobin 7.9 (*)    HCT 27.6 (*)    MCH 24.6 (*)    MCHC 28.6 (*)    All other components within normal limits  COMPREHENSIVE METABOLIC PANEL - Abnormal; Notable for the following components:   Glucose, Bld 114 (*)    BUN 28 (*)    Calcium 8.5 (*)     Total Protein 6.3 (*)    AST 14 (*)    All other components within normal limits  BRAIN NATRIURETIC PEPTIDE - Abnormal; Notable for the following components:   B Natriuretic Peptide 117.0 (*)    All other components within normal limits  POC OCCULT BLOOD, ED - Abnormal; Notable for the following components:   Fecal Occult Bld POSITIVE (*)    All other components within normal limits  RESPIRATORY PANEL BY RT PCR (FLU A&B, COVID)  TYPE AND SCREEN  PREPARE RBC (CROSSMATCH)    EKG EKG Interpretation  Date/Time:  Tuesday March 03 2020 15:45:33 EDT Ventricular Rate:  66 PR Interval:    QRS Duration: 92 QT Interval:  394 QTC Calculation: 413 R Axis:   85 Text Interpretation: Atrial fibrillation Abnormal ECG Confirmed by Fredia Sorrow [phone]) on 03/03/2020 3:53:03 PM   Radiology DG Chest Portable 1 View  Result Date: 03/03/2020 CLINICAL DATA:  Leg swelling.  History of CHF. EXAM: PORTABLE CHEST 1 VIEW COMPARISON:  06/25/2018 FINDINGS: Midline trachea. Borderline cardiomegaly. Atherosclerosis in the transverse aorta. No pleural effusion or pneumothorax. No congestive failure. IMPRESSION: Borderline cardiomegaly, without acute disease. Aortic Atherosclerosis (ICD10-I70.0). Electronically Signed   By: Abigail Miyamoto M.D.   On: 03/03/2020 19:33    Procedures .Critical Care Performed by: Kinnie Feil, PA-C Authorized by: Kinnie Feil, PA-C   Critical care provider statement:    Critical care time (minutes):  45   Critical care was necessary to treat or prevent imminent or life-threatening deterioration of the following conditions: symptomatic anemia.   Critical care was time spent personally by me on the following activities:  Discussions with consultants, evaluation of patient's response to treatment, examination of patient, ordering and performing treatments and interventions, ordering and review of laboratory studies, ordering and review of radiographic studies, pulse  oximetry, re-evaluation of patient's condition, obtaining history from patient or surrogate, review of old charts and development of treatment plan with patient or surrogate   I assumed direction of critical care for this patient from another provider in my specialty: no     (including critical care time)  Medications Ordered in ED Medications  0.9 %  sodium chloride infusion (10 mL/hr Intravenous New Bag/Given 03/03/20 1823)  pantoprazole (PROTONIX) injection 40 mg (40 mg Intravenous Given 03/03/20 1834)    ED Course  I have reviewed the triage vital signs and the nursing notes.  Pertinent labs & imaging results that were available during my care of the patient were reviewed by me and considered in my medical decision making (see chart for details).  Clinical Course as of Mar 03 2001  Tue Mar 03, 2020  1707 Last 12.2 1 year ago   Hemoglobin(!): 7.9 [CG]  1708 BUN(!): 28 [CG]  1708 HCT(!): 27.6 [CG]  1727 Afib rate controlled   ED EKG [CG]  1728 Collected by RN when patient went to the bathroom, reports stool was brown. No melena.   POC occult blood, ED(!) [CG]  1818 B Natriuretic Peptide(!): 117.0 [CG]  1827 Discussed with EDP Zackowski, will add CXR. Transfuse 1  PRBC instead of 2, slow.    [CG]  [id number]   [CG]  2001 Borderline cardiomegaly, without acute disease.  Aortic Atherosclerosis (ICD10-I70.0).    DG Chest Portable 1 View [CG]    Clinical Course User Index [CG] Arlean Hopping   MDM Rules/Calculators/A&P                          79 year old male with history of PAF on Eliquis presents to the ER from cardiology office for evaluation of low hemoglobin.  Has had peripheral leg swelling despite increasing Lasix to 40 mg twice daily.  Reports positional lightheadedness with two falls in the last month, worse with standing up, bending over.  No chest pain, shortness of breath.  Has noticed dark almost black stools.  I reviewed patient's EMR, triage  nursing notes.  Seen at cardiologist earlier today for suspected acute CHF exacerbation given his peripheral edema.  His lab work at that time showed hemoglobin 7.9.  Had sigmoidoscopy by Dr Arnoldo Morale 2017 showing severe diverticulosis.   ER work-up initiated in triage by triage RN including screening labs.  I personally visualized and interpreted these.  Hemoglobin is 7.9, hematocrit 27.6, BUN 28.  I have added EKG, BNP, chest x-ray, type and screen, respiratory panel and Hemoccult.   1926: Patient had a bowel movement in the ER, Hemoccult collected by RN.  Hemoccult is positive, stool was brown per RN.  Patient was given IV Protonix, 1 unit of PRBC.  BNP is 117, chest x-ray without overt pulmonary edema.  Patient does appear fluid overloaded in his legs but has no respiratory symptoms of pulmonary edema.  We will consult hospitalist for admission for symptomatic anemia, blood transfusion, GI consult.  He may need gentle diuresis at the same time as he is getting blood transfusion.  Some peripheral pitting edema, without pulmonary edema. Initially patient was orthostatic at cardiologist office SBP 110/60 > 98/58 and symptomatic, but here his blood pressure has been better low 100s.  Has been ambulatory in ER with mild dizziness.  Suspect hypotension from lasix, metoprolol, lisinopril.  Discussed with EDP Zackowski.    Addendum 1958: Re-evaluated patient again to update him on POC/admission.  DRE performed by me, stool is brown. Pain with DRE mostly at 3-6 o'clock, but no obvious external fissures or hemorrhoids. Patient states he uses a large swab with medicine on it for pain in the rectum before bowel movements, does not know the name of it.  Given report of pain with BM source of bleed may be more distal?  Also reporting continued dizziness, SBP high 80s on exam.  Reports right anterior leg pain since fall, tender with old appearing ecchymosis, will add x-ray.  Final Clinical Impression(s) / ED  Diagnoses Final diagnoses:  Symptomatic anemia    Rx / DC Orders ED Discharge Orders    None         Arlean Hopping 03/03/20 Baltazar Najjar, MD 03/13/20 [phone]

## 2020-03-03 NOTE — ED Triage Notes (Signed)
Pt reports he was sent to the ED due to low blood counts. CBC done today by Cardiology and Hgb shown to be 7.9. Pt reports a recent increase in Lasix (last Thursday) due to increased leg swelling. Ever since the Lasix increase his BP has been running low, having leg cramps and feeling dizzy when he stands up.

## 2020-03-03 NOTE — ED Notes (Signed)
Wife given pts wallet and card holder by pt.

## 2020-03-03 NOTE — Telephone Encounter (Signed)
Reports recent lasix increase by PCP last Thursday to 40 mg BID for leg swelling. K+ 10 meq daily dose stayed the same. Now reports cramping in legs last night Reports swelling in legs are the same. Denies swelling in other body areas. Reports he wears compression stockings daily. Denies SOB or palpitations. Reports chest pain off and on for a while. Denies active chest pain.  Has not checked BP or HR today. BP 88/45 & HR 78 yesterday and Sunday 03/01/20 90/ 50 HR 69  Weight today 226.5 lbs and says baseline weight is around 222 - 228 lbs. Weight last Thursday 02/27/20 229.5 lbs Medications reviewed and office note and lab work requested from PCP Advised to hold lisinopril and will contact with an appointment  Appointment arranged with Ermalinda Barrios PA at 11:00 am at the [facility] office Patient and wife aware

## 2020-03-03 NOTE — Patient Instructions (Addendum)
Medication Instructions:  Your physician recommends that you continue on your current medications as directed. Please refer to the Current Medication list given to you today.  Decrease Lasix to 40 mg Daily   *If you need a refill on your cardiac medications before your next appointment, please call your pharmacy*   Lab Work: Your physician recommends that you return for lab work in: Today   If you have labs (blood work) drawn today and your tests are completely normal, you will receive your results only by: Marland Kitchen MyChart Message (if you have MyChart) OR . A paper copy in the mail If you have any lab test that is abnormal or we need to change your treatment, we will call you to review the results.   Testing/Procedures: Your physician has requested that you have an echocardiogram. Echocardiography is a painless test that uses sound waves to create images of your heart. It provides your doctor with information about the size and shape of your heart and how well your heart's chambers and valves are working. This procedure takes approximately one hour. There are no restrictions for this procedure.    Follow-Up: At [facility], you and your health needs are our priority.  As part of our continuing mission to provide you with exceptional heart care, we have created designated Provider Care Teams.  These Care Teams include your primary Cardiologist (physician) and Advanced Practice Providers (APPs -  Physician Assistants and Nurse Practitioners) who all work together to provide you with the care you need, when you need it.  We recommend signing up for the patient portal called "MyChart".  Sign up information is provided on this After Visit Summary.  MyChart is used to connect with patients for Virtual Visits (Telemedicine).  Patients are able to view lab/test results, encounter notes, upcoming appointments, etc.  Non-urgent messages can be sent to your provider as well.   To learn more about what  you can do with MyChart, go to [email or web address].    Your next appointment:   2 week(s)  The format for your next appointment:   In Person  Provider:   K. Mali Hilty, MD   Other Instructions Thank you for choosing [facility]!  Two Gram Sodium Diet 2000 mg  What is Sodium? Sodium is a mineral found naturally in many foods. The most significant source of sodium in the diet is table salt, which is about 40% sodium.  Processed, convenience, and preserved foods also contain a large amount of sodium.  The body needs only 500 mg of sodium daily to function,  A normal diet provides more than enough sodium even if you do not use salt.  Why Limit Sodium? A build up of sodium in the body can cause thirst, increased blood pressure, shortness of breath, and water retention.  Decreasing sodium in the diet can reduce edema and risk of heart attack or stroke associated with high blood pressure.  Keep in mind that there are many other factors involved in these health problems.  Heredity, obesity, lack of exercise, cigarette smoking, stress and what you eat all play a role.  General Guidelines:  Do not add salt at the table or in cooking.  One teaspoon of salt contains over 2 grams of sodium.  Read food labels  Avoid processed and convenience foods  Ask your dietitian before eating any foods not dicussed in the menu planning guidelines  Consult your physician if you wish to use a salt substitute or a sodium  containing medication such as antacids.  Limit milk and milk products to 16 oz (2 cups) per day.  Shopping Hints:  READ LABELS!! "Dietetic" does not necessarily mean low sodium.  Salt and other sodium ingredients are often added to foods during processing.   Menu Planning Guidelines Food Group Choose More Often Avoid  Beverages (see also the milk group All fruit juices, low-sodium, salt-free vegetables juices, low-sodium carbonated beverages Regular vegetable or tomato  juices, commercially softened water used for drinking or cooking  Breads and Cereals Enriched white, wheat, rye and pumpernickel bread, hard rolls and dinner rolls; muffins, cornbread and waffles; most dry cereals, cooked cereal without added salt; unsalted crackers and breadsticks; low sodium or homemade bread crumbs Bread, rolls and crackers with salted tops; quick breads; instant hot cereals; pancakes; commercial bread stuffing; self-rising flower and biscuit mixes; regular bread crumbs or cracker crumbs  Desserts and Sweets Desserts and sweets mad with mild should be within allowance Instant pudding mixes and cake mixes  Fats Butter or margarine; vegetable oils; unsalted salad dressings, regular salad dressings limited to 1 Tbs; light, sour and heavy cream Regular salad dressings containing bacon fat, bacon bits, and salt pork; snack dips made with instant soup mixes or processed cheese; salted nuts  Fruits Most fresh, frozen and canned fruits Fruits processed with salt or sodium-containing ingredient (some dried fruits are processed with sodium sulfites        Vegetables Fresh, frozen vegetables and low- sodium canned vegetables Regular canned vegetables, sauerkraut, pickled vegetables, and others prepared in brine; frozen vegetables in sauces; vegetables seasoned with ham, bacon or salt pork  Condiments, Sauces, Miscellaneous  Salt substitute with physician's approval; pepper, herbs, spices; vinegar, lemon or lime juice; hot pepper sauce; garlic powder, onion powder, low sodium soy sauce (1 Tbs.); low sodium condiments (ketchup, chili sauce, mustard) in limited amounts (1 tsp.) fresh ground horseradish; unsalted tortilla chips, pretzels, potato chips, popcorn, salsa (1/4 cup) Any seasoning made with salt including garlic salt, celery salt, onion salt, and seasoned salt; sea salt, rock salt, kosher salt; meat tenderizers; monosodium glutamate; mustard, regular soy sauce, barbecue, sauce, chili  sauce, teriyaki sauce, steak sauce, Worcestershire sauce, and most flavored vinegars; canned gravy and mixes; regular condiments; salted snack foods, olives, picles, relish, horseradish sauce, catsup   Food preparation: Try these seasonings Meats:    Pork Sage, onion Serve with applesauce  Chicken Poultry seasoning, thyme, parsley Serve with cranberry sauce  Lamb Curry powder, rosemary, garlic, thyme Serve with mint sauce or jelly  Veal Marjoram, basil Serve with current jelly, cranberry sauce  Beef Pepper, bay leaf Serve with dry mustard, unsalted chive butter  Fish [place] leaf, dill Serve with unsalted lemon butter, unsalted parsley butter  Vegetables:    Asparagus Lemon juice   Broccoli Lemon juice   Carrots Mustard dressing parsley, mint, nutmeg, glazed with unsalted butter and sugar   Green beans Marjoram, lemon juice, nutmeg,dill seed   Tomatoes Basil, marjoram, onion   Spice /blend for [employer]" 4 tsp ground thyme 1 tsp ground sage 3 tsp ground rosemary 4 tsp ground marjoram   Test your knowledge 1. A product that says "Salt Free" may still contain sodium. True or False 2. Garlic Powder and Hot Pepper Sauce an be used as alternative seasonings.True or False 3. Processed foods have more sodium than fresh foods.  True or False 4. Canned Vegetables have less sodium than froze True or False  WAYS TO DECREASE YOUR SODIUM INTAKE 1. Avoid  the use of added salt in cooking and at the table.  Table salt (and other prepared seasonings which contain salt) is probably one of the greatest sources of sodium in the diet.  Unsalted foods can gain flavor from the sweet, sour, and butter taste sensations of herbs and spices.  Instead of using salt for seasoning, try the following seasonings with the foods listed.  Remember: how you use them to enhance natural food flavors is limited only by your creativity... Allspice-Meat, fish, eggs, fruit, peas, red and yellow vegetables Almond Extract-Fruit  baked goods Anise Seed-Sweet breads, fruit, carrots, beets, cottage cheese, cookies (tastes like licorice) Basil-Meat, fish, eggs, vegetables, rice, vegetables salads, soups, sauces Bay Leaf-Meat, fish, stews, poultry Burnet-Salad, vegetables (cucumber-like flavor) Caraway Seed-Bread, cookies, cottage cheese, meat, vegetables, cheese, rice Cardamon-Baked goods, fruit, soups Celery Powder or seed-Salads, salad dressings, sauces, meatloaf, soup, bread.Do not use  celery salt Chervil-Meats, salads, fish, eggs, vegetables, cottage cheese (parsley-like flavor) Chili Power-Meatloaf, chicken cheese, corn, eggplant, egg dishes Chives-Salads cottage cheese, egg dishes, soups, vegetables, sauces Cilantro-Salsa, casseroles Cinnamon-Baked goods, fruit, pork, lamb, chicken, carrots Cloves-Fruit, baked goods, fish, pot roast, green beans, beets, carrots Coriander-Pastry, cookies, meat, salads, cheese (lemon-orange flavor) Cumin-Meatloaf, fish,cheese, eggs, cabbage,fruit pie (caraway flavor) [employer], fruit, eggs, fish, poultry, cottage cheese, vegetables Dill Seed-Meat, cottage cheese, poultry, vegetables, fish, salads, bread Fennel Seed-Bread, cookies, apples, pork, eggs, fish, beets, cabbage, cheese, Licorice-like flavor Garlic-(buds or powder) Salads, meat, poultry, fish, bread, butter, vegetables, potatoes.Do not  use garlic salt Ginger-Fruit, vegetables, baked goods, meat, fish, poultry Horseradish Root-Meet, vegetables, butter Lemon Juice or Extract-Vegetables, fruit, tea, baked goods, fish salads Mace-Baked goods fruit, vegetables, fish, poultry (taste like nutmeg) Maple Extract-Syrups Marjoram-Meat, chicken, fish, vegetables, breads, green salads (taste like Sage) Mint-Tea, lamb, sherbet, vegetables, desserts, carrots, cabbage Mustard, Dry or Seed-Cheese, eggs, meats, vegetables, poultry Nutmeg-Baked goods, fruit, chicken, eggs, vegetables, desserts Onion Powder-Meat, fish, poultry,  vegetables, cheese, eggs, bread, rice salads (Do not use   Onion salt) Orange Extract-Desserts, baked goods Oregano-Pasta, eggs, cheese, onions, pork, lamb, fish, chicken, vegetables, green salads Paprika-Meat, fish, poultry, eggs, cheese, vegetables Parsley Flakes-Butter, vegetables, meat fish, poultry, eggs, bread, salads (certain forms may   Contain sodium Pepper-Meat fish, poultry, vegetables, eggs Peppermint Extract-Desserts, baked goods Poppy Seed-Eggs, bread, cheese, fruit dressings, baked goods, noodles, vegetables, cottage  [employer], poultry, meat, fish, cauliflower, turnips,eggs bread Saffron-Rice, bread, veal, chicken, fish, eggs Sage-Meat, fish, poultry, onions, eggplant, tomateos, pork, stews Savory-Eggs, salads, poultry, meat, rice, vegetables, soups, pork Tarragon-Meat, poultry, fish, eggs, butter, vegetables (licorice-like flavor)  Thyme-Meat, poultry, fish, eggs, vegetables, (clover-like flavor), sauces, soups Tumeric-Salads, butter, eggs, fish, rice, vegetables (saffron-like flavor) Vanilla Extract-Baked goods, candy Vinegar-Salads, vegetables, meat marinades Walnut Extract-baked goods, candy  2. Choose your Foods Wisely   The following is a list of foods to avoid which are high in sodium:  Meats-Avoid all smoked, canned, salt cured, dried and kosher meat and fish as well as Anchovies   [employer] meats:Bologna, Liverwurst, Pastrami Canned meat or fish  Marinated herring Caviar    Pepperoni Corned Beef   Pizza Dried chipped beef  Salami Frozen breaded fish or meat Salt pork Frankfurters or hot dogs  Sardines Gefilte fish   Sausage Ham (boiled ham, Proscuitto Smoked butt    spiced ham)   Spam      TV Dinners Vegetables Canned vegetables (Regular) Relish Canned mushrooms  Sauerkraut Olives    Tomato juice [employer] and Dessert  Products Canned puddings  Cream pies Cheesecake   Decorated  cakes Cookies  Beverages/Juices Tomato juice, regular  Gatorade   V-8 vegetable juice, regular  Breads and Cereals Biscuit mixes   Salted potato chips, corn chips, pretzels Bread stuffing mixes  Salted crackers and rolls Pancake and waffle mixes Self-rising flour  Seasonings Accent    Meat sauces Barbecue sauce  Meat tenderizer Catsup    Monosodium glutamate (MSG) Celery salt   Onion salt Chili sauce   Prepared mustard Garlic salt   Salt, seasoned salt, sea salt Gravy mixes   Soy sauce Horseradish   Steak sauce Ketchup   Tartar sauce Lite salt    Teriyaki sauce Marinade mixes   Worcestershire sauce  Others Baking powder   Cocoa and cocoa mixes Baking soda   Commercial casserole mixes Candy-caramels, chocolate  Dehydrated soups    Bars, fudge,nougats  Instant rice and pasta mixes Canned broth or soup  Maraschino cherries Cheese, aged and processed cheese and cheese spreads  Learning Assessment Quiz  Indicated T (for True) or F (for False) for each of the following statements:  1. _____ Fresh fruits and vegetables and unprocessed grains are generally low in sodium 2. _____ Water may contain a considerable amount of sodium, depending on the source 3. _____ You can always tell if a food is high in sodium by tasting it 4. _____ Certain laxatives my be high in sodium and should be avoided unless prescribed   by a physician or pharmacist 5. _____ Salt substitutes may be used freely by anyone on a sodium restricted diet 6. _____ Sodium is present in table salt, food additives and as a natural component of   most foods 7. _____ Table salt is approximately 90% sodium 8. _____ Limiting sodium intake may help prevent excess fluid accumulation in the body 9. _____ On a sodium-restricted diet, seasonings such as bouillon soy sauce, and    cooking wine should be used in place of table salt 10. _____ On an ingredient list, a product which lists monosodium glutamate as the first    ingredient is an appropriate food to include on a low sodium diet  Circle the best answer(s) to the following statements (Hint: there may be more than one correct answer)  11. On a low-sodium diet, some acceptable snack items are:    A. Olives  F. Bean dip   K. Grapefruit juice    B. Salted Pretzels G. Commercial Popcorn   L. Canned peaches    C. Carrot Sticks  H. Bouillon   M. Unsalted nuts   D. [place] fries  I. Peanut butter crackers N. Salami   E. Sweet pickles J. Tomato Juice   O. Pizza  12.  Seasonings that may be used freely on a reduced - sodium diet include   A. Lemon wedges F.Monosodium glutamate K. Celery seed    B.Soysauce   G. Pepper   L. Mustard powder   C. Sea salt  H. Cooking wine  M. Onion flakes   D. Vinegar  E. Prepared horseradish N. Salsa   E. Sage   J. Worcestershire sauce  O. Chutney

## 2020-03-03 NOTE — Progress Notes (Signed)
Cardiology Office Note    Date:  03/03/2020   ID:  Paul Lam, DOB [birth year], MRN [id number]  PCP:  [name], MD  Cardiologist: Pixie Casino, MD EPS: None  Chief Complaint  Patient presents with  . Leg Swelling  . Dizziness    History of Present Illness:  Paul Lam is a 79 y.o. male with a history of nonobstructive coronary disease on cath in 2007 with an EF of about 50-55%, also hypertension, PAF [place] Vasc=4 on Eliquis, dyslipidemia and sleep apnea on CPAP   Last saw Dr. Debara Pickett 03/2019 at which time he was doing well and plan was for rate control of atrial fibrillation.  He was told he could take extra Lasix as needed for edema.  Last echo 2019 LVEF 55 to 60% mild aortic regurgitation, mild MR.  Patient called in today because of ongoing leg swelling.  His PCP increased his Lasix from 20 mg once daily to 40 mg twice daily last Thursday and blood pressure has been running low 88/45 90/50.  Weight stable at 226 at home scales after losing 6 lbs but legs still swollen. Getting some extra salt. Very dizzy with change of position and leg cramps.K wasn't increased.  Has fallen twice recently with his dizziness.   Past Medical History:  Diagnosis Date  . Anginal pain ([place])   . Anxiety   . Arthritis    generalized arthritis   . CAD (coronary artery disease)   . Cancer ([place])    skin cancer removed left ear    . Complication of anesthesia   . Depression   . Headache   . History of echocardiogram 02/19/2009   EF 50-55%; LA mild-mod dilated;   . History of kidney stones   . History of nuclear stress test 02/19/2009   low risk, normal   . Hyperlipidemia   . Hypertension   . Obesity (BMI 30-39.9)   . OSA (obstructive sleep apnea)    severe with AHI 25/hr now on BiPAP  . PONV (postoperative nausea and vomiting)   . Retinal detachment    with proliferative vitreoretinopathy  . Shortness of breath    related to weight   . Sleep apnea   . Vertigo     Past  Surgical History:  Procedure Laterality Date  . APPENDECTOMY    . CARDIAC CATHETERIZATION  07/13/2005   noncritical CAD  . CARDIOVERSION N/A 01/29/2018   Procedure: CARDIOVERSION;  Surgeon: Dorothy Spark, MD;  Location: [facility];  Service: Cardiovascular;  Laterality: N/A;  . EYE SURGERY    . FLEXIBLE SIGMOIDOSCOPY N/A 07/14/2015   Procedure: FLEXIBLE SIGMOIDOSCOPY;  Surgeon: Aviva Signs, MD;  Location: [facility];  Service: Gastroenterology;  Laterality: N/A;  . JOINT REPLACEMENT    . NASAL SEPTOPLASTY W/ TURBINOPLASTY Bilateral 12/19/2017   Procedure: NASAL SEPTOPLASTY WITH TURBINATE REDUCTION;  Surgeon: [name], MD;  Location: [place];  Service: ENT;  Laterality: Bilateral;  . NASAL SINUS SURGERY    . PARS PLANA VITRECTOMY Left 06/20/2017   Procedure: PARS PLANA VITRECTOMY WITH 25 GAUGE, REPAIR OF COMPLEX TRACTION RETINAL DETACHMENT, MEMBRANE PEEEL;  Surgeon: [name], MD;  Location: [place];  Service: Ophthalmology;  Laterality: Left;  . REPAIR OF COMPLEX TRACTION RETINAL DETACHMENT Left 06/20/2017  . RETINAL DETACHMENT SURGERY Right 04/2011  . SCLERAL BUCKLE WITH POSSIBLE 25 GAUGE PARS PLANA VITRECTOMY Left 05/22/2017   Procedure: SCLERAL BUCKLE, LASER, GAS INJECTION LEFT EYE;  Surgeon: Tempie Hoist  D, MD;  Location: [place];  Service: Ophthalmology;  Laterality: Left;  . SKIN CANCER EXCISION Left 2013   left ear skin ca removed  2 weeks ago - not completeely healed 08/15/11   . TONSILLECTOMY    . TOTAL KNEE ARTHROPLASTY  08/22/2011   Procedure: TOTAL KNEE ARTHROPLASTY;  Surgeon: Gearlean Alf, MD;  Location: [facility];  Service: Orthopedics;  Laterality: Right;    Current Medications: Current Meds  Medication Sig  . ALPRAZolam (XANAX) 0.5 MG tablet Take 0.5 mg at bedtime by mouth. Anxiety   . Ascorbic Acid (VITAMIN C WITH ROSE HIPS) 500 MG tablet Take 500 mg by mouth daily.  . brimonidine (ALPHAGAN) 0.15 % ophthalmic solution Place 1 drop into the  left eye 2 (two) times daily.  . Calcium Polycarbophil (FIBER-CAPS PO) Take by mouth.  Marland Kitchen CHERRY PO Take 1,200 mg by mouth.  . Cholecalciferol (VITAMIN D3 PO) Take by mouth.  Arne Cleveland 5 MG TABS tablet TAKE (1) TABLET TWICE DAILY.  . finasteride (PROSCAR) 5 MG tablet Take 1 tablet (5 mg total) by mouth daily with supper.  Marland Kitchen lisinopril (ZESTRIL) 5 MG tablet Take 5 mg by mouth daily.  . Methylcellulose, Laxative, (CITRUCEL PO) Take 1 capsule by mouth daily.   . metoprolol tartrate (LOPRESSOR) 25 MG tablet Take 1 tablet (25 mg total) by mouth 2 (two) times daily.  . Misc Natural Products (TART CHERRY ADVANCED PO) Take 1 tablet by mouth daily.   . nabumetone (RELAFEN) 750 MG tablet Take 750 mg by mouth daily.   . NON FORMULARY at bedtime. VPAP  . potassium chloride (K-DUR) 10 MEQ tablet Take 10 mEq daily as needed by mouth (with the lasix for fluid retention.). Take with Lasix  . simvastatin (ZOCOR) 40 MG tablet Take 40 mg by mouth at bedtime.  . tamsulosin (FLOMAX) 0.4 MG CAPS capsule Take 1 capsule (0.4 mg total) by mouth in the morning and at bedtime. Take 1 capsule (0.4 mg) by mouth daily with supper (~1830) & take 1 capsule (0.4 mg) by mouth at bedtime.  . Zinc 50 MG TABS Take by mouth.  . [DISCONTINUED] furosemide (LASIX) 20 MG tablet Take 20 mg daily as needed by mouth (for fluid retention.).      Allergies:   Patient has no known allergies.   Social History   Socioeconomic History  . Marital status: Married    Spouse name: Not on file  . Number of children: 1  . Years of education: Not on file  . Highest education level: Not on file  Occupational History  . Not on file  Tobacco Use  . Smoking status: Never Smoker  . Smokeless tobacco: Never Used  Vaping Use  . Vaping Use: Never assessed  Substance and Sexual Activity  . Alcohol use: No  . Drug use: No  . Sexual activity: Not on file  Other Topics Concern  . Not on file  Social History Narrative  . Not on file   Social  Determinants of Health   Financial Resource Strain:   . Difficulty of Paying Living Expenses: Not on file  Food Insecurity:   . Worried About Charity fundraiser in the Last Year: Not on file  . Ran Out of Food in the Last Year: Not on file  Transportation Needs:   . Lack of Transportation (Medical): Not on file  . Lack of Transportation (Non-Medical): Not on file  Physical Activity:   . Days of Exercise per Week:  Not on file  . Minutes of Exercise per Session: Not on file  Stress:   . Feeling of Stress : Not on file  Social Connections:   . Frequency of Communication with Friends and Family: Not on file  . Frequency of Social Gatherings with Friends and Family: Not on file  . Attends Religious Services: Not on file  . Active Member of Clubs or Organizations: Not on file  . Attends Archivist Meetings: Not on file  . Marital Status: Not on file     Family History:  The patient's   family history includes Heart failure in his father and mother; Stroke in his father.   ROS:   Please see the history of present illness.    ROS All other systems reviewed and are negative.   PHYSICAL EXAM:   VS:  BP (!) 118/58   Pulse 84   Ht _0  (1.727 m)   Wt 232 lb (105.2 kg)   SpO2 97%   BMI 35.28 kg/m   Physical Exam  GEN: Obese, in no acute distress   Neck: no JVD, carotid bruits, or masses Cardiac:RRR; no murmurs, rubs, or gallops  Respiratory:  clear to auscultation bilaterally, normal work of breathing GI: soft, nontender, nondistended, + BS Ext: without cyanosis, clubbing, or edema, Good distal pulses bilaterally Neuro:  Alert and Oriented x 3 Psych: euthymic mood, full affect  Wt Readings from Last 3 Encounters:  03/03/20 232 lb (105.2 kg)  08/14/19 228 lb (103.4 kg)  03/21/19 230 lb 6.4 oz (104.5 kg)      Studies/Labs Reviewed:   EKG:  EKG is  ordered today.  The ekg ordered today demonstrates atrial fibrillation at 75 bpm no acute change  Recent Labs: No  results found for requested labs within last 8760 hours.   Lipid Panel No results found for: CHOL, TRIG, HDL, CHOLHDL, VLDL, LDLCALC, LDLDIRECT  Additional studies/ records that were reviewed today include:  2D echo 01/08/2018  Study Conclusions   - Left ventricle: The cavity size was normal. Wall thickness was    increased in a pattern of mild LVH. Systolic function was normal.    The estimated ejection fraction was in the range of 55% to 60%.    Wall motion was normal; there were no regional wall motion    abnormalities.  - Aortic valve: There was mild regurgitation. Valve area (Vmax):    2.19 cm^2.  - Mitral valve: Calcified annulus. There was mild regurgitation.   Impressions:   - Normal LV systolic function; mild LVH; calcified aortic valve    with mild AI; mild MR.     ASSESSMENT:    1. Acute on chronic diastolic CHF (congestive heart failure) ([place])   2. Orthostatic hypotension   3. Permanent atrial fibrillation ([place])   4. Coronary artery disease involving native coronary artery of native heart without angina pectoris   5. Hyperlipidemia, unspecified hyperlipidemia type      PLAN:  In order of problems listed above:  Acute on chronic diastolic CHF Lasix increased by PCP last Thursday from 20 mg once daily to 40 mg twice daily for leg swelling blood pressure running low since then.  Weight 226.5 which is within his baseline range.  Leg still swollen  BNP was 173 creatinine 1.1 last Thursday but does not know other labs.  Getting some extra salt.  Recommend 2 g sodium diet.  He is orthostatic in the office so decrease Lasix to 40 mg once  daily.  May need to decrease lisinopril as well.  Will check labs including CBC be met and BNP.  Update 2D echo.   Orthostatic hypotension blood pressure dropped from 110/60 lying to 98/58 sitting and standing patient was quite dizzy.  Will decrease Lasix to 40 mg once daily may need to decrease lisinopril.  We will see how he does with  this first.  Check CBC as well.  Denies any bleeding problems.  Has fallen twice recently.  Permanent atrial fibrillation on Eliquis and metoprolol rate controlled  Nonobstructive CAD on cath in 2007  Hyperlipidemia LDL 49 on Lipitor managed by PCP  Sleep apnea on CPAP    Medication Adjustments/Labs and Tests Ordered: Current medicines are reviewed at length with the patient today.  Concerns regarding medicines are outlined above.  Medication changes, Labs and Tests ordered today are listed in the Patient Instructions below. Patient Instructions   Medication Instructions:  Your physician recommends that you continue on your current medications as directed. Please refer to the Current Medication list given to you today.  Decrease Lasix to 40 mg Daily   *If you need a refill on your cardiac medications before your next appointment, please call your pharmacy*   Lab Work: Your physician recommends that you return for lab work in: Today   If you have labs (blood work) drawn today and your tests are completely normal, you will receive your results only by: Marland Kitchen MyChart Message (if you have MyChart) OR . A paper copy in the mail If you have any lab test that is abnormal or we need to change your treatment, we will call you to review the results.   Testing/Procedures: Your physician has requested that you have an echocardiogram. Echocardiography is a painless test that uses sound waves to create images of your heart. It provides your doctor with information about the size and shape of your heart and how well your heart's chambers and valves are working. This procedure takes approximately one hour. There are no restrictions for this procedure.    Follow-Up: At [facility], you and your health needs are our priority.  As part of our continuing mission to provide you with exceptional heart care, we have created designated Provider Care Teams.  These Care Teams include your primary  Cardiologist (physician) and Advanced Practice Providers (APPs -  Physician Assistants and Nurse Practitioners) who all work together to provide you with the care you need, when you need it.  We recommend signing up for the patient portal called "MyChart".  Sign up information is provided on this After Visit Summary.  MyChart is used to connect with patients for Virtual Visits (Telemedicine).  Patients are able to view lab/test results, encounter notes, upcoming appointments, etc.  Non-urgent messages can be sent to your provider as well.   To learn more about what you can do with MyChart, go to [email or web address].    Your next appointment:   2 week(s)  The format for your next appointment:   In Person  Provider:   K. Mali Hilty, MD   Other Instructions Thank you for choosing [facility]!  Two Gram Sodium Diet 2000 mg  What is Sodium? Sodium is a mineral found naturally in many foods. The most significant source of sodium in the diet is table salt, which is about 40% sodium.  Processed, convenience, and preserved foods also contain a large amount of sodium.  The body needs only 500 mg of sodium daily to function,  A normal diet provides more than enough sodium even if you do not use salt.  Why Limit Sodium? A build up of sodium in the body can cause thirst, increased blood pressure, shortness of breath, and water retention.  Decreasing sodium in the diet can reduce edema and risk of heart attack or stroke associated with high blood pressure.  Keep in mind that there are many other factors involved in these health problems.  Heredity, obesity, lack of exercise, cigarette smoking, stress and what you eat all play a role.  General Guidelines:  Do not add salt at the table or in cooking.  One teaspoon of salt contains over 2 grams of sodium.  Read food labels  Avoid processed and convenience foods  Ask your dietitian before eating any foods not dicussed in the menu planning  guidelines  Consult your physician if you wish to use a salt substitute or a sodium containing medication such as antacids.  Limit milk and milk products to 16 oz (2 cups) per day.  Shopping Hints:  READ LABELS!! "Dietetic" does not necessarily mean low sodium.  Salt and other sodium ingredients are often added to foods during processing.   Menu Planning Guidelines Food Group Choose More Often Avoid  Beverages (see also the milk group All fruit juices, low-sodium, salt-free vegetables juices, low-sodium carbonated beverages Regular vegetable or tomato juices, commercially softened water used for drinking or cooking  Breads and Cereals Enriched white, wheat, rye and pumpernickel bread, hard rolls and dinner rolls; muffins, cornbread and waffles; most dry cereals, cooked cereal without added salt; unsalted crackers and breadsticks; low sodium or homemade bread crumbs Bread, rolls and crackers with salted tops; quick breads; instant hot cereals; pancakes; commercial bread stuffing; self-rising flower and biscuit mixes; regular bread crumbs or cracker crumbs  Desserts and Sweets Desserts and sweets mad with mild should be within allowance Instant pudding mixes and cake mixes  Fats Butter or margarine; vegetable oils; unsalted salad dressings, regular salad dressings limited to 1 Tbs; light, sour and heavy cream Regular salad dressings containing bacon fat, bacon bits, and salt pork; snack dips made with instant soup mixes or processed cheese; salted nuts  Fruits Most fresh, frozen and canned fruits Fruits processed with salt or sodium-containing ingredient (some dried fruits are processed with sodium sulfites        Vegetables Fresh, frozen vegetables and low- sodium canned vegetables Regular canned vegetables, sauerkraut, pickled vegetables, and others prepared in brine; frozen vegetables in sauces; vegetables seasoned with ham, bacon or salt pork  Condiments, Sauces, Miscellaneous  Salt  substitute with physician's approval; pepper, herbs, spices; vinegar, lemon or lime juice; hot pepper sauce; garlic powder, onion powder, low sodium soy sauce (1 Tbs.); low sodium condiments (ketchup, chili sauce, mustard) in limited amounts (1 tsp.) fresh ground horseradish; unsalted tortilla chips, pretzels, potato chips, popcorn, salsa (1/4 cup) Any seasoning made with salt including garlic salt, celery salt, onion salt, and seasoned salt; sea salt, rock salt, kosher salt; meat tenderizers; monosodium glutamate; mustard, regular soy sauce, barbecue, sauce, chili sauce, teriyaki sauce, steak sauce, Worcestershire sauce, and most flavored vinegars; canned gravy and mixes; regular condiments; salted snack foods, olives, picles, relish, horseradish sauce, catsup   Food preparation: Try these seasonings Meats:    Pork Sage, onion Serve with applesauce  Chicken Poultry seasoning, thyme, parsley Serve with cranberry sauce  Lamb Curry powder, rosemary, garlic, thyme Serve with mint sauce or jelly  Veal Marjoram, basil Serve with current jelly, cranberry sauce  Beef Pepper, bay leaf Serve with dry mustard, unsalted chive butter  Fish [place] leaf, dill Serve with unsalted lemon butter, unsalted parsley butter  Vegetables:    Asparagus Lemon juice   Broccoli Lemon juice   Carrots Mustard dressing parsley, mint, nutmeg, glazed with unsalted butter and sugar   Green beans Marjoram, lemon juice, nutmeg,dill seed   Tomatoes Basil, marjoram, onion   Spice /blend for [employer]" 4 tsp ground thyme 1 tsp ground sage 3 tsp ground rosemary 4 tsp ground marjoram   Test your knowledge 1. A product that says "Salt Free" may still contain sodium. True or False 2. Garlic Powder and Hot Pepper Sauce an be used as alternative seasonings.True or False 3. Processed foods have more sodium than fresh foods.  True or False 4. Canned Vegetables have less sodium than froze True or False  WAYS TO DECREASE YOUR SODIUM  INTAKE 1. Avoid the use of added salt in cooking and at the table.  Table salt (and other prepared seasonings which contain salt) is probably one of the greatest sources of sodium in the diet.  Unsalted foods can gain flavor from the sweet, sour, and butter taste sensations of herbs and spices.  Instead of using salt for seasoning, try the following seasonings with the foods listed.  Remember: how you use them to enhance natural food flavors is limited only by your creativity... Allspice-Meat, fish, eggs, fruit, peas, red and yellow vegetables Almond Extract-Fruit baked goods Anise Seed-Sweet breads, fruit, carrots, beets, cottage cheese, cookies (tastes like licorice) Basil-Meat, fish, eggs, vegetables, rice, vegetables salads, soups, sauces Bay Leaf-Meat, fish, stews, poultry Burnet-Salad, vegetables (cucumber-like flavor) Caraway Seed-Bread, cookies, cottage cheese, meat, vegetables, cheese, rice Cardamon-Baked goods, fruit, soups Celery Powder or seed-Salads, salad dressings, sauces, meatloaf, soup, bread.Do not use  celery salt Chervil-Meats, salads, fish, eggs, vegetables, cottage cheese (parsley-like flavor) Chili Power-Meatloaf, chicken cheese, corn, eggplant, egg dishes Chives-Salads cottage cheese, egg dishes, soups, vegetables, sauces Cilantro-Salsa, casseroles Cinnamon-Baked goods, fruit, pork, lamb, chicken, carrots Cloves-Fruit, baked goods, fish, pot roast, green beans, beets, carrots Coriander-Pastry, cookies, meat, salads, cheese (lemon-orange flavor) Cumin-Meatloaf, fish,cheese, eggs, cabbage,fruit pie (caraway flavor) [employer], fruit, eggs, fish, poultry, cottage cheese, vegetables Dill Seed-Meat, cottage cheese, poultry, vegetables, fish, salads, bread Fennel Seed-Bread, cookies, apples, pork, eggs, fish, beets, cabbage, cheese, Licorice-like flavor Garlic-(buds or powder) Salads, meat, poultry, fish, bread, butter, vegetables, potatoes.Do not  use garlic  salt Ginger-Fruit, vegetables, baked goods, meat, fish, poultry Horseradish Root-Meet, vegetables, butter Lemon Juice or Extract-Vegetables, fruit, tea, baked goods, fish salads Mace-Baked goods fruit, vegetables, fish, poultry (taste like nutmeg) Maple Extract-Syrups Marjoram-Meat, chicken, fish, vegetables, breads, green salads (taste like Sage) Mint-Tea, lamb, sherbet, vegetables, desserts, carrots, cabbage Mustard, Dry or Seed-Cheese, eggs, meats, vegetables, poultry Nutmeg-Baked goods, fruit, chicken, eggs, vegetables, desserts Onion Powder-Meat, fish, poultry, vegetables, cheese, eggs, bread, rice salads (Do not use   Onion salt) Orange Extract-Desserts, baked goods Oregano-Pasta, eggs, cheese, onions, pork, lamb, fish, chicken, vegetables, green salads Paprika-Meat, fish, poultry, eggs, cheese, vegetables Parsley Flakes-Butter, vegetables, meat fish, poultry, eggs, bread, salads (certain forms may   Contain sodium Pepper-Meat fish, poultry, vegetables, eggs Peppermint Extract-Desserts, baked goods Poppy Seed-Eggs, bread, cheese, fruit dressings, baked goods, noodles, vegetables, cottage  [employer], poultry, meat, fish, cauliflower, turnips,eggs bread Saffron-Rice, bread, veal, chicken, fish, eggs Sage-Meat, fish, poultry, onions, eggplant, tomateos, pork, stews Savory-Eggs, salads, poultry, meat, rice, vegetables, soups, pork Tarragon-Meat, poultry, fish, eggs, butter, vegetables (licorice-like flavor)  Thyme-Meat, poultry, fish, eggs,  vegetables, (clover-like flavor), sauces, soups Tumeric-Salads, butter, eggs, fish, rice, vegetables (saffron-like flavor) Vanilla Extract-Baked goods, candy Vinegar-Salads, vegetables, meat marinades Walnut Extract-baked goods, candy  2. Choose your Foods Wisely   The following is a list of foods to avoid which are high in sodium:  Meats-Avoid all smoked, canned, salt cured, dried and kosher meat and  fish as well as Anchovies   [employer] meats:Bologna, Liverwurst, Pastrami Canned meat or fish  Marinated herring Caviar    Pepperoni Corned Beef   Pizza Dried chipped beef  Salami Frozen breaded fish or meat Salt pork Frankfurters or hot dogs  Sardines Gefilte fish   Sausage Ham (boiled ham, Proscuitto Smoked butt    spiced ham)   Spam      TV Dinners Vegetables Canned vegetables (Regular) Relish Canned mushrooms  Sauerkraut Olives    Tomato juice Pickles  Bakery and Dessert Products Canned puddings  Cream pies Cheesecake   Decorated cakes Cookies  Beverages/Juices Tomato juice, regular  Gatorade   V-8 vegetable juice, regular  Breads and Cereals Biscuit mixes   Salted potato chips, corn chips, pretzels Bread stuffing mixes  Salted crackers and rolls Pancake and waffle mixes Self-rising flour  Seasonings Accent    Meat sauces Barbecue sauce  Meat tenderizer Catsup    Monosodium glutamate (MSG) Celery salt   Onion salt Chili sauce   Prepared mustard Garlic salt   Salt, seasoned salt, sea salt Gravy mixes   Soy sauce Horseradish   Steak sauce Ketchup   Tartar sauce Lite salt    Teriyaki sauce Marinade mixes   Worcestershire sauce  Others Baking powder   Cocoa and cocoa mixes Baking soda   Commercial casserole mixes Candy-caramels, chocolate  Dehydrated soups    Bars, fudge,nougats  Instant rice and pasta mixes Canned broth or soup  Maraschino cherries Cheese, aged and processed cheese and cheese spreads  Learning Assessment Quiz  Indicated T (for True) or F (for False) for each of the following statements:  1. _____ Fresh fruits and vegetables and unprocessed grains are generally low in sodium 2. _____ Water may contain a considerable amount of sodium, depending on the source 3. _____ You can always tell if a food is high in sodium by tasting it 4. _____ Certain laxatives my be high in sodium and should be avoided unless prescribed   by a  physician or pharmacist 5. _____ Salt substitutes may be used freely by anyone on a sodium restricted diet 6. _____ Sodium is present in table salt, food additives and as a natural component of   most foods 7. _____ Table salt is approximately 90% sodium 8. _____ Limiting sodium intake may help prevent excess fluid accumulation in the body 9. _____ On a sodium-restricted diet, seasonings such as bouillon soy sauce, and    cooking wine should be used in place of table salt 10. _____ On an ingredient list, a product which lists monosodium glutamate as the first   ingredient is an appropriate food to include on a low sodium diet  Circle the best answer(s) to the following statements (Hint: there may be more than one correct answer)  11. On a low-sodium diet, some acceptable snack items are:    A. Olives  F. Bean dip   K. Grapefruit juice    B. Salted Pretzels G. Commercial Popcorn   L. Canned peaches    C. Carrot Sticks  H. Bouillon   M. Unsalted nuts  D. French fries  I. Peanut butter crackers N. Salami   E. Sweet pickles J. Tomato Juice   O. Pizza  12.  Seasonings that may be used freely on a reduced - sodium diet include   A. Lemon wedges F.Monosodium glutamate K. Celery seed    B.Soysauce   G. Pepper   L. Mustard powder   C. Sea salt  H. Cooking wine  M. Onion flakes   D. Vinegar  E. Prepared horseradish N. Salsa   E. Sage   J. Worcestershire sauce  O. [address]      Sumner Boast, PA-C  03/03/2020 11:38 AM    [facility], [place], [place]  [address] Phone: [phone]; Fax: [phone]

## 2020-03-03 NOTE — Telephone Encounter (Signed)
Patient is calling because his PCP did blood work on him and called him to tell him he is in mild CHF.  His PCP advised him to give [place] a call to find out if Dr. Debara Pickett would want to see him prior to his 10/18 appt.  He also increased lasix as well, because of swelling in his legs.  Patient states he has passed out twice, he feels when he bends over like no blood is going to his head.

## 2020-03-03 NOTE — H&P (Signed)
History and Physical    Paul Lam TOI:[id number] DOB: 06-15-1940 DOA: 03/03/2020  PCP: [name], MD Patient coming from: Cardiology office  Chief Complaint: Anemia  HPI: Paul Lam is a 79 y.o. male with medical history significant of non-obstructive CAD, Afib, nephrolithiasis, HLD, HTN, OSA.  Pt reports from Cardiology office after in office labs showed a Hgb of 7.9. Pt went to the cardiologist for evaluation of ongoing and worsening bouts of dizziness and near syncope. Seen by PCP 5 days ago pt was increased from 20mg  to 40mg  lasix BID due to what was felt to be fluid overload due to LE edema. Pt noticed his BP lately has been as low as 83-38S systolic. Denies any bloody stools or dark tarry stool. No CP, epigastric pain, SOB, Palpitations.    ED Course: Started on Protonix and given 1 u nit PRBC  Review of Systems: As per HPI otherwise all other systems reviewed and are negative  Ambulatory Status: no restrictions.   Past Medical History:  Diagnosis Date  . Anginal pain ([place])   . Anxiety   . Arthritis    generalized arthritis   . Atrial fibrillation ([place])   . CAD (coronary artery disease)   . Cancer ([place])    skin cancer removed left ear    . Complication of anesthesia   . Depression   . Headache   . History of echocardiogram 02/19/2009   EF 50-55%; LA mild-mod dilated;   . History of kidney stones   . History of nuclear stress test 02/19/2009   low risk, normal   . Hyperlipidemia   . Hypertension   . Obesity (BMI 30-39.9)   . OSA (obstructive sleep apnea)    severe with AHI 25/hr now on BiPAP  . PONV (postoperative nausea and vomiting)   . Retinal detachment    with proliferative vitreoretinopathy  . Shortness of breath    related to weight   . Sleep apnea   . Vertigo     Past Surgical History:  Procedure Laterality Date  . APPENDECTOMY    . CARDIAC CATHETERIZATION  07/13/2005   noncritical CAD  . CARDIOVERSION N/A 01/29/2018   Procedure:  CARDIOVERSION;  Surgeon: Dorothy Spark, MD;  Location[facility];  Service: Cardiovascular;  Laterality: N/A;  . EYE SURGERY    . FLEXIBLE SIGMOIDOSCOPY N/A 07/14/2015   Procedure: FLEXIBLE SIGMOIDOSCOPY;  Surgeon: Aviva Signs, MD;  Location: [facility];  Service: Gastroenterology;  Laterality: N/A;  . JOINT REPLACEMENT    . NASAL SEPTOPLASTY W/ TURBINOPLASTY Bilateral 12/19/2017   Procedure: NASAL SEPTOPLASTY WITH TURBINATE REDUCTION;  Surgeon: [name], MD;  Location: [facility];  Service: ENT;  Laterality: Bilateral;  . NASAL SINUS SURGERY    . PARS PLANA VITRECTOMY Left 06/20/2017   Procedure: PARS PLANA VITRECTOMY WITH 25 GAUGE, REPAIR OF COMPLEX TRACTION RETINAL DETACHMENT, MEMBRANE PEEEL;  Surgeon: [name], MD;  Location: [facility];  Service: Ophthalmology;  Laterality: Left;  . REPAIR OF COMPLEX TRACTION RETINAL DETACHMENT Left 06/20/2017  . RETINAL DETACHMENT SURGERY Right 04/2011  . SCLERAL BUCKLE WITH POSSIBLE 25 GAUGE PARS PLANA VITRECTOMY Left 05/22/2017   Procedure: SCLERAL BUCKLE, LASER, GAS INJECTION LEFT EYE;  Surgeon: [name], MD;  Location: [facility];  Service: Ophthalmology;  Laterality: Left;  . SKIN CANCER EXCISION Left 2013   left ear skin ca removed  2 weeks ago - not completeely healed 08/15/11   . TONSILLECTOMY    . TOTAL  KNEE ARTHROPLASTY  08/22/2011   Procedure: TOTAL KNEE ARTHROPLASTY;  Surgeon: Gearlean Alf, MD;  Location: [facility];  Service: Orthopedics;  Laterality: Right;    Social History   Socioeconomic History  . Marital status: Married    Spouse name: Not on file  . Number of children: 1  . Years of education: Not on file  . Highest education level: Not on file  Occupational History  . Not on file  Tobacco Use  . Smoking status: Never Smoker  . Smokeless tobacco: Never Used  Vaping Use  . Vaping Use: Never used  Substance and Sexual Activity  . Alcohol use: No  . Drug use: No  . Sexual activity: Not on file    Other Topics Concern  . Not on file  Social History Narrative  . Not on file   Social Determinants of Health   Financial Resource Strain:   . Difficulty of Paying Living Expenses: Not on file  Food Insecurity:   . Worried About Charity fundraiser in the Last Year: Not on file  . Ran Out of Food in the Last Year: Not on file  Transportation Needs:   . Lack of Transportation (Medical): Not on file  . Lack of Transportation (Non-Medical): Not on file  Physical Activity:   . Days of Exercise per Week: Not on file  . Minutes of Exercise per Session: Not on file  Stress:   . Feeling of Stress : Not on file  Social Connections:   . Frequency of Communication with Friends and Family: Not on file  . Frequency of Social Gatherings with Friends and Family: Not on file  . Attends Religious Services: Not on file  . Active Member of Clubs or Organizations: Not on file  . Attends Archivist Meetings: Not on file  . Marital Status: Not on file  Intimate Partner Violence:   . Fear of Current or Ex-Partner: Not on file  . Emotionally Abused: Not on file  . Physically Abused: Not on file  . Sexually Abused: Not on file    No Known Allergies  Family History  Problem Relation Age of Onset  . Heart failure Mother        also arthritis  . Stroke Father        also emphysema  . Heart failure Father       Prior to Admission medications   Medication Sig Start Date End Date Taking? Authorizing Provider  ALPRAZolam Duanne Moron) 0.5 MG tablet Take 0.5 mg at bedtime by mouth. Anxiety    Yes [provider]  Ascorbic Acid (VITAMIN C WITH ROSE HIPS) 500 MG tablet Take 500 mg by mouth daily.   Yes [provider]  brimonidine (ALPHAGAN) 0.15 % ophthalmic solution Place 1 drop into the left eye 2 (two) times daily. 12/23/17  Yes [provider]  Calcium Polycarbophil (FIBER-CAPS PO) Take by mouth.   Yes [provider]  Cholecalciferol (VITAMIN D3 PO) Take  by mouth.   Yes [provider]  docusate sodium (COLACE) 100 MG capsule Take 100 mg by mouth 2 (two) times daily.   Yes [provider]  ELIQUIS 5 MG TABS tablet TAKE (1) TABLET TWICE DAILY. 09/25/19  Yes Hilty, Nadean Corwin, MD  finasteride (PROSCAR) 5 MG tablet Take 1 tablet (5 mg total) by mouth daily with supper. 08/14/19  Yes McKenzie, Candee Furbish, MD  furosemide (LASIX) 40 MG tablet Take 1 tablet (40 mg total) by mouth  daily. 03/03/20  Yes Imogene Burn, PA-C  lisinopril (ZESTRIL) 5 MG tablet Take 5 mg by mouth daily. 06/21/19  Yes [provider]  metoprolol tartrate (LOPRESSOR) 25 MG tablet Take 1 tablet (25 mg total) by mouth 2 (two) times daily. 06/27/18  Yes Barton Dubois, MD  Misc Natural Products (TART CHERRY ADVANCED PO) Take 1 tablet by mouth daily.    Yes [provider]  nabumetone (RELAFEN) 750 MG tablet Take 750 mg by mouth daily.    Yes [provider]  NON FORMULARY at bedtime. VPAP   Yes [provider]  potassium chloride (K-DUR) 10 MEQ tablet Take 10 mEq daily as needed by mouth (with the lasix for fluid retention.). Take with Lasix 12/31/14  Yes [provider]  simvastatin (ZOCOR) 40 MG tablet Take 40 mg by mouth at bedtime.   Yes [provider]  tamsulosin (FLOMAX) 0.4 MG CAPS capsule Take 1 capsule (0.4 mg total) by mouth in the morning and at bedtime. Take 1 capsule (0.4 mg) by mouth daily with supper (~1830) & take 1 capsule (0.4 mg) by mouth at bedtime. 12/19/19  Yes McKenzie, Candee Furbish, MD  Zinc 50 MG TABS Take by mouth.   Yes [provider]    Physical Exam: Vitals:   03/03/20 1531 03/03/20 1830 03/03/20 1900 03/03/20 2012  BP: (!) 108/50 106/60 (!) 95/53 (!) 113/31  Pulse:  92 64 74  Resp:  16 18 18   Temp:    98.1 F (36.7 C)  TempSrc:    Oral  SpO2:  100% 100% 100%  Weight:      Height:         General:  Appears calm and comfortable Eyes:  PERRL, EOMI, normal lids, iris ENT:   grossly normal hearing, lips & tongue, mmm Neck:  no LAD, masses or thyromegaly Cardiovascular:  Regularly irregular, no m/r/g. 3+ LE edema.  Respiratory:  CTA bilaterally, no w/r/r. Normal respiratory effort. Abdomen:  soft, ntnd, NABS Skin:  no rash or induration seen on limited exam Musculoskeletal:  grossly normal tone BUE/BLE, good ROM, no bony abnormality Psychiatric:  grossly normal mood and affect, speech fluent and appropriate, AOx3 Neurologic:  CN 2-12 grossly intact, moves all extremities in coordinated fashion, sensation intact  Labs on Admission: I have personally reviewed following labs and imaging studies  CBC: Recent Labs  Lab 03/03/20 1248 03/03/20 1606  WBC 5.3 5.2  NEUTROABS  --  3.1  HGB 7.9* 7.9*  HCT 27.4* 27.6*  MCV 86.2 86.0  PLT 148* 761   Basic Metabolic Panel: Recent Labs  Lab 03/03/20 1248 03/03/20 1606  NA 138 137  K 4.3 4.0  CL 102 101  CO2 28 27  GLUCOSE 105* 114*  BUN 28* 28*  CREATININE 1.16 1.16  CALCIUM 8.7* 8.5*   GFR: Estimated Creatinine Clearance: 61.9 mL/min (by C-G formula based on SCr of 1.16 mg/dL). Liver Function Tests: Recent Labs  Lab 03/03/20 1606  AST 14*  ALT 10  ALKPHOS 79  BILITOT 0.8  PROT 6.3*  ALBUMIN 3.5   No results for input(s): LIPASE, AMYLASE in the last 168 hours. No results for input(s): AMMONIA in the last 168 hours. Coagulation Profile: No results for input(s): INR, PROTIME in the last 168 hours. Cardiac Enzymes: No results for input(s): CKTOTAL, CKMB, CKMBINDEX, TROPONINI in the last 168 hours. BNP (last 3 results) No results for input(s): PROBNP in the last 8760 hours. HbA1C: No results for input(s): HGBA1C in  the last 72 hours. CBG: No results for input(s): GLUCAP in the last 168 hours. Lipid Profile: No results for input(s): CHOL, HDL, LDLCALC, TRIG, CHOLHDL, LDLDIRECT in the last 72 hours. Thyroid Function Tests: No results for input(s): TSH, T4TOTAL, FREET4, T3FREE, THYROIDAB in the  last 72 hours. Anemia Panel: No results for input(s): VITAMINB12, FOLATE, FERRITIN, TIBC, IRON, RETICCTPCT in the last 72 hours. Urine analysis:    Component Value Date/Time   COLORURINE YELLOW 06/26/2018 1045   APPEARANCEUR HAZY (A) 06/26/2018 1045   LABSPEC 1.021 06/26/2018 1045   PHURINE 6.0 06/26/2018 1045   GLUCOSEU NEGATIVE 06/26/2018 1045   HGBUR SMALL (A) 06/26/2018 1045   BILIRUBINUR neg 08/14/2019 1151   KETONESUR NEGATIVE 06/26/2018 1045   PROTEINUR Negative 08/14/2019 1151   PROTEINUR NEGATIVE 06/26/2018 1045   UROBILINOGEN 0.2 08/14/2019 1151   UROBILINOGEN 0.2 08/15/2011 1248   NITRITE neg 08/14/2019 1151   NITRITE POSITIVE (A) 06/26/2018 1045   LEUKOCYTESUR Negative 08/14/2019 1151    Creatinine Clearance: Estimated Creatinine Clearance: 61.9 mL/min (by C-G formula based on SCr of 1.16 mg/dL).  Sepsis Labs: @LABRCNTIP (procalcitonin:4,lacticidven:4) ) Recent Results (from the past 240 hour(s))  Respiratory Panel by RT PCR (Flu A&B, Covid) - Nasopharyngeal Swab     Status: None   Collection Time: 03/03/20  6:30 PM   Specimen: Nasopharyngeal Swab  Result Value Ref Range Status   SARS Coronavirus 2 by RT PCR NEGATIVE NEGATIVE Final    Comment: (NOTE) SARS-CoV-2 target nucleic acids are NOT DETECTED.  The SARS-CoV-2 RNA is generally detectable in upper respiratoy specimens during the acute phase of infection. The lowest concentration of SARS-CoV-2 viral copies this assay can detect is 131 copies/mL. A negative result does not preclude SARS-Cov-2 infection and should not be used as the sole basis for treatment or other patient management decisions. A negative result may occur with  improper specimen collection/handling, submission of specimen other than nasopharyngeal swab, presence of viral mutation(s) within the areas targeted by this assay, and inadequate number of viral copies (<131 copies/mL). A negative result must be combined with clinical observations,  patient history, and epidemiological information. The expected result is Negative.  Fact Sheet for Patients:  PinkCheek.be  Fact Sheet for Healthcare Providers:  GravelBags.it  This test is no t yet approved or cleared by the [place] FDA and  has been authorized for detection and/or diagnosis of SARS-CoV-2 by FDA under an Emergency Use Authorization (EUA). This EUA will remain  in effect (meaning this test can be used) for the duration of the COVID-19 declaration under Section 564(b)(1) of the Act, 21 U.S.C. section 360bbb-3(b)(1), unless the authorization is terminated or revoked sooner.     Influenza A by PCR NEGATIVE NEGATIVE Final   Influenza B by PCR NEGATIVE NEGATIVE Final    Comment: (NOTE) The Xpert Xpress SARS-CoV-2/FLU/RSV assay is intended as an aid in  the diagnosis of influenza from Nasopharyngeal swab specimens and  should not be used as a sole basis for treatment. Nasal washings and  aspirates are unacceptable for Xpert Xpress SARS-CoV-2/FLU/RSV  testing.  Fact Sheet for Patients: PinkCheek.be  Fact Sheet for Healthcare Providers: GravelBags.it  This test is not yet approved or cleared by the [place] FDA and  has been authorized for detection and/or diagnosis of SARS-CoV-2 by  FDA under an Emergency Use Authorization (EUA). This EUA will remain  in effect (meaning this test can be used) for the duration of the  Covid-19 declaration under Section 564(b)(1) of the  Act, 21  U.S.C. section 360bbb-3(b)(1), unless the authorization is  terminated or revoked. Performed at [facility], [address] [place], [place] [address]      Radiological Exams on Admission: DG Chest Portable 1 View  Result Date: 03/03/2020 CLINICAL DATA:  Leg swelling.  History of CHF. EXAM: PORTABLE CHEST 1 VIEW COMPARISON:  06/25/2018 FINDINGS: Midline trachea.  Borderline cardiomegaly. Atherosclerosis in the transverse aorta. No pleural effusion or pneumothorax. No congestive failure. IMPRESSION: Borderline cardiomegaly, without acute disease. Aortic Atherosclerosis (ICD10-I70.0). Electronically Signed   By: Abigail Miyamoto M.D.   On: 03/03/2020 19:33    EKG: Independently reviewed. Afib.   Assessment/Plan Active Problems:   Anxiety   Hypertension   Atrial fibrillation, chronic (HCC)   Symptomatic anemia   Hypotension   Acute diastolic CHF (congestive heart failure) (HCC)   Symptomatic anemia: Baseline HGB 12.2. 7.9 confirmed x2 at time of admission Hemocult positive in ED. Near syncope multiple times w/ SBP in the 80-90s. Last flex sig was in 2017 (unable to perform colo due to difficluty during the procedure (Dr. Arnoldo Morale is Pts GI) - CBC in am after 1 unit PRBC - GI consult in am for colonsocopy - may be able to perform outpt.  - Hold home relafan and Eliquis.  Acute dCHF: Mild. EF 55% w/ LVH. Did not respond initially to increased Lasix dose at home from 20-40.  - Consider using Lasix 40 IV but currently hypotensive due to anemia. Will asses in the am for proper dosing.  - Strict I/O - BMP in am  Afib: rate controlled - Hold Eliquis and resume after evaluation in am.  - Continue Metop in am (may need ot hold if pressure unable to support)  XKG:[id number] on admission in the 90s.  - hold home Lisinopril  HLD: - continue home statin  BPH/Urinary hesitancy: - Continue Proscar - Change Flomax to PRN  Anxiety: - continue Xanax    DVT prophylaxis: SCD  Code Status: full  Family Communication: none  Disposition Plan: pending GI eval and improvement in Hgb  Consults called: GI  Admission status: inpt    Waldemar Dickens MD [facility]  If 7PM-7AM, please contact night-coverage [email or web address] Password [email or web address]  03/03/2020, 8:16 PM

## 2020-03-04 ENCOUNTER — Inpatient Hospital Stay (HOSPITAL_COMMUNITY): Payer: Medicare Other

## 2020-03-04 DIAGNOSIS — D649 Anemia, unspecified: Secondary | ICD-10-CM | POA: Diagnosis not present

## 2020-03-04 DIAGNOSIS — I10 Essential (primary) hypertension: Secondary | ICD-10-CM | POA: Diagnosis not present

## 2020-03-04 DIAGNOSIS — D500 Iron deficiency anemia secondary to blood loss (chronic): Secondary | ICD-10-CM

## 2020-03-04 DIAGNOSIS — I482 Chronic atrial fibrillation, unspecified: Secondary | ICD-10-CM | POA: Diagnosis not present

## 2020-03-04 DIAGNOSIS — I340 Nonrheumatic mitral (valve) insufficiency: Secondary | ICD-10-CM

## 2020-03-04 DIAGNOSIS — I351 Nonrheumatic aortic (valve) insufficiency: Secondary | ICD-10-CM

## 2020-03-04 DIAGNOSIS — I350 Nonrheumatic aortic (valve) stenosis: Secondary | ICD-10-CM

## 2020-03-04 DIAGNOSIS — I5031 Acute diastolic (congestive) heart failure: Secondary | ICD-10-CM | POA: Diagnosis not present

## 2020-03-04 LAB — ECHOCARDIOGRAM COMPLETE
AR max vel: 1.46 cm2
AV Area VTI: 1.16 cm2
AV Area mean vel: 1.27 cm2
AV Mean grad: 11.3 mmHg
AV Peak grad: 21 mmHg
Ao pk vel: 2.29 m/s
Area-P 1/2: 3.45 cm2
Height: 68 in
P 1/2 time: 725 msec
S' Lateral: 3.06 cm
Weight: 3624 oz

## 2020-03-04 LAB — CBC
HCT: 30 % — ABNORMAL LOW (ref 39.0–52.0)
Hemoglobin: 8.7 g/dL — ABNORMAL LOW (ref 13.0–17.0)
MCH: 24.9 pg — ABNORMAL LOW (ref 26.0–34.0)
MCHC: 29 g/dL — ABNORMAL LOW (ref 30.0–36.0)
MCV: 86 fL (ref 80.0–100.0)
Platelets: 120 10*3/uL — ABNORMAL LOW (ref 150–400)
RBC: 3.49 MIL/uL — ABNORMAL LOW (ref 4.22–5.81)
RDW: 14.6 % (ref 11.5–15.5)
WBC: 5 10*3/uL (ref 4.0–10.5)
nRBC: 0 % (ref 0.0–0.2)

## 2020-03-04 LAB — BASIC METABOLIC PANEL
Anion gap: 7 (ref 5–15)
BUN: 25 mg/dL — ABNORMAL HIGH (ref 8–23)
CO2: 27 mmol/L (ref 22–32)
Calcium: 8.5 mg/dL — ABNORMAL LOW (ref 8.9–10.3)
Chloride: 104 mmol/L (ref 98–111)
Creatinine, Ser: 1.04 mg/dL (ref 0.61–1.24)
GFR calc Af Amer: >60 mL/min (ref >60)
GFR calc non Af Amer: >60 mL/min (ref >60)
Glucose, Bld: 93 mg/dL (ref 70–99)
Potassium: 4.2 mmol/L (ref 3.5–5.1)
Sodium: 138 mmol/L (ref 135–145)

## 2020-03-04 LAB — TROPONIN I (HIGH SENSITIVITY)
Troponin I (High Sensitivity): 8 ng/L (ref <18)
Troponin I (High Sensitivity): 8 ng/L (ref <18)

## 2020-03-04 MED ORDER — FUROSEMIDE 10 MG/ML IJ SOLN
20.0000 mg | Freq: Two times a day (BID) | INTRAMUSCULAR | Status: DC
  Administered 2020-03-04 – 2020-03-07 (×5): 20 mg via INTRAVENOUS
  Filled 2020-03-04 (×6): qty 2

## 2020-03-04 MED ORDER — TAMSULOSIN HCL 0.4 MG PO CAPS
0.4000 mg | ORAL_CAPSULE | Freq: Every day | ORAL | Status: DC
  Administered 2020-03-04 – 2020-03-06 (×3): 0.4 mg via ORAL
  Filled 2020-03-04 (×4): qty 1

## 2020-03-04 NOTE — TOC Initial Note (Signed)
Transition of Care [facility]) - Initial/Assessment Note    Patient Details  Name: Paul Lam MRN: [id number] Date of Birth: 1940/07/03  [facility] CM/SW Contact:    Salome Arnt, [place] Phone Number: 03/04/2020, 8:47 AM  Clinical Narrative:    Pt admitted due to symptomatic anemia. He reports he lives with his wife and is independent with ADLs. TOC consulted for CHF screening. LCSW completed assessment with pt. He reports he takes daily weights and his wife records them along with his blood pressure every day. Pt does most of the cooking at home and he indicates he follows a heart healthy diet. He takes his medications as prescribed and is followed by Dr. Debara Pickett. Pt plans to return home when medically stable and reports no TOC needs at this time. Will continue to follow.                Expected Discharge Plan: Home/Self Care Barriers to Discharge: Continued Medical Work up   Patient Goals and CMS Choice Patient states their goals for this hospitalization and ongoing recovery are:: return home      Expected Discharge Plan and Services Expected Discharge Plan: Home/Self Care In-house Referral: Clinical Social Work     Living arrangements for the past 2 months: Single Family Home                                      Prior Living Arrangements/Services Living arrangements for the past 2 months: Single Family Home Lives with:: Spouse Patient language and need for interpreter reviewed:: Yes Do you feel safe going back to the place where you live?: Yes      Need for Family Participation in Patient Care: No (Comment)     Criminal Activity/Legal Involvement Pertinent to Current Situation/Hospitalization: No - Comment as needed  Activities of Daily Living      Permission Sought/Granted                  Emotional Assessment   Attitude/Demeanor/Rapport: Engaged Affect (typically observed): Accepting Orientation: : Oriented to Self, Oriented  to Place, Oriented to  Time, Oriented to Situation Alcohol / Substance Use: Not Applicable Psych Involvement: Outpatient Provider (PCP)  Admission diagnosis:  Symptomatic anemia [D64.9] Patient Active Problem List   Diagnosis Date Noted  . Symptomatic anemia 03/03/2020  . Hypotension 03/03/2020  . Acute diastolic CHF (congestive heart failure) ([place]) 03/03/2020  . Benign localized prostatic hyperplasia with lower urinary tract symptoms (LUTS) 08/14/2019  . Nocturia 08/14/2019  . Erectile dysfunction due to arterial insufficiency 08/14/2019  . Acute lower UTI   . Atrial fibrillation, chronic ([place]) 06/26/2018  . Fever 06/26/2018  . Thrombocytopenia ([place]) 06/26/2018  . Atrial fibrillation with RVR ([place]) 06/25/2018  . S/P nasal septoplasty 12/19/2017  . Rhegmatogenous retinal detachment of left eye 05/22/2017  . Vertigo 01/02/2015  . Obesity (BMI 30-39.9)   . Symptomatic bradycardia 01/02/2014  . OSA (obstructive sleep apnea)   . Hypertension   . Anxiety 12/27/2012  . Morbid obesity due to excess calories ([place]) 12/27/2012  . Dizziness 12/27/2012  . Stiffness of knee joint, right 10/11/2011  . Difficulty in walking(719.7) 10/11/2011  . Pain due to total knee replacement ([place]) 10/11/2011  . Postop Hyponatremia 08/23/2011  . OA (osteoarthritis) of knee 08/22/2011   PCP:  [name], MD Pharmacy:   [facility] [place]  [place] [address] Phone: [phone] Fax: [phone]     Social Determinants of Health (SDOH) Interventions    Readmission Risk Interventions No flowsheet data found.

## 2020-03-04 NOTE — Consult Note (Signed)
Referring Provider: Eleonore Chiquito, MD Primary Care Physician:  [name], MD Primary Gastroenterologist:  Dr. Laural Golden  Reason for Consultation:    Anemia and heme positive stool.  HPI:   Patient is 79 year old Caucasian male with coronary artery disease atrial fibrillation on Eliquis/apixaban who was seen at cardiology office by Ms. Amie Portland, PA-C for leg swelling and dizziness.  Lab studies are obtained and his hemoglobin noted to be 7.9 g.  Patient was therefore advised to go to emergency room for further evaluation.  Patient's repeat hemoglobin was 7.9 g.  He was noted to have heme positive stool.  Patient has received 1 unit of PRBCs and hemoglobin this morning was 8.7 g. Patient states his stools have been dark intermittently for at least 6 months.  They have not been black.  He denies rectal bleeding or abdominal pain.  His appetite is good.  His bowels generally move daily.  He has been feeling weak lately.  He has fallen twice in the last 3 to 4 weeks.  First episode occurred while he was cutting bushes in his yard and second episode occurred when he was trying to get lawnmower off the trailer.  He has had few spells of near syncope particularly when he is bending and stands up. He gave [place] history of polyarthralgia and has been on nabumetone.  He is not sure for how long he has been taking this medication.  He does not take OTC NSAIDs. Has history of colonic polyps.  He says 2 polyps were found on first exam several years ago.  None were found on second exam.  His third exam in February 2017 was incomplete because of multiple diverticula and tortuous sigmoid colon.  These exams were performed by Dr. Aviva Signs.  He was supposed to have barium enema but I do not see any study in our system.  Patient is married.  He has 1 daughter.  Patient volunteered to me that his grandson committed suicide at age 62.  He did have a son who is now 58 years old. Patient worked at [employer] for 15 years and  also farmed for many years.  He has never smoked cigarettes or drank alcohol. Father was a heavy smoker and died of CVA in his 25s.  Mother had coronary artery disease as well as valvular heart disease and died while at [facility].  She in her 62s. He has 1 sister age 74 in poor health.    Past Medical History:  Diagnosis Date  . Anginal pain ([place])   . Anxiety   . Arthritis    generalized arthritis   . Atrial fibrillation ([place])   . CAD (coronary artery disease)   . Cancer ([place])    skin cancer removed left ear    . Complication of anesthesia   . Depression   . Headache   . History of echocardiogram 02/19/2009   EF 50-55%; LA mild-mod dilated;   . History of kidney stones   . History of nuclear stress test 02/19/2009   low risk, normal   . Hyperlipidemia   . Hypertension   . Obesity (BMI 30-39.9)   . OSA (obstructive sleep apnea)    severe with AHI 25/hr now on BiPAP  . PONV (postoperative nausea and vomiting)   . Retinal detachment    with proliferative vitreoretinopathy  . Shortness of breath    related to weight   . Sleep apnea   . Vertigo     Past Surgical History:  Procedure Laterality Date  . APPENDECTOMY    . CARDIAC CATHETERIZATION  07/13/2005   noncritical CAD  . CARDIOVERSION N/A 01/29/2018   Procedure: CARDIOVERSION;  Surgeon: Dorothy Spark, MD;  Location: [facility] ENDOSCOPY;  Service: Cardiovascular;  Laterality: N/A;  . EYE SURGERY    . FLEXIBLE SIGMOIDOSCOPY N/A 07/14/2015   Procedure: FLEXIBLE SIGMOIDOSCOPY;  Surgeon: Aviva Signs, MD;  Location: [facility] ENDO SUITE;  Service: Gastroenterology;  Laterality: N/A;  . JOINT REPLACEMENT    . NASAL SEPTOPLASTY W/ TURBINOPLASTY Bilateral 12/19/2017   Procedure: NASAL SEPTOPLASTY WITH TURBINATE REDUCTION;  Surgeon: [name], MD;  Location: [place];  Service: ENT;  Laterality: Bilateral;  . NASAL SINUS SURGERY    . PARS PLANA VITRECTOMY Left 06/20/2017   Procedure: PARS PLANA VITRECTOMY WITH 25 GAUGE, REPAIR OF  COMPLEX TRACTION RETINAL DETACHMENT, MEMBRANE PEEEL;  Surgeon: [name], MD;  Location: [place];  Service: Ophthalmology;  Laterality: Left;  . REPAIR OF COMPLEX TRACTION RETINAL DETACHMENT Left 06/20/2017  . RETINAL DETACHMENT SURGERY Right 04/2011  . SCLERAL BUCKLE WITH POSSIBLE 25 GAUGE PARS PLANA VITRECTOMY Left 05/22/2017   Procedure: SCLERAL BUCKLE, LASER, GAS INJECTION LEFT EYE;  Surgeon: [name], MD;  Location: [place];  Service: Ophthalmology;  Laterality: Left;  . SKIN CANCER EXCISION Left 2013   left ear skin ca removed  2 weeks ago - not completeely healed 08/15/11   . TONSILLECTOMY    . TOTAL KNEE ARTHROPLASTY  08/22/2011   Procedure: TOTAL KNEE ARTHROPLASTY;  Surgeon: Gearlean Alf, MD;  Location: [facility] ORS;  Service: Orthopedics;  Laterality: Right;    Prior to Admission medications   Medication Sig Start Date End Date Taking? Authorizing Provider  ALPRAZolam Duanne Moron) 0.5 MG tablet Take 0.5 mg at bedtime by mouth. Anxiety    Yes [provider]  Ascorbic Acid (VITAMIN C WITH ROSE HIPS) 500 MG tablet Take 500 mg by mouth daily.   Yes [provider]  brimonidine (ALPHAGAN) 0.15 % ophthalmic solution Place 1 drop into the left eye 2 (two) times daily. 12/23/17  Yes [provider]  Calcium Polycarbophil (FIBER-CAPS PO) Take by mouth.   Yes [provider]  Cholecalciferol (VITAMIN D3 PO) Take by mouth.   Yes [provider]  docusate sodium (COLACE) 100 MG capsule Take 100 mg by mouth 2 (two) times daily.   Yes [provider]  ELIQUIS 5 MG TABS tablet TAKE (1) TABLET TWICE DAILY. 09/25/19  Yes Hilty, Nadean Corwin, MD  finasteride (PROSCAR) 5 MG tablet Take 1 tablet (5 mg total) by mouth daily with supper. 08/14/19  Yes McKenzie, Candee Furbish, MD  furosemide (LASIX) 40 MG tablet Take 1 tablet (40 mg total) by mouth daily. 03/03/20  Yes Imogene Burn, PA-C  lisinopril (ZESTRIL) 5 MG tablet Take 5 mg by mouth daily. 06/21/19   Yes [provider]  metoprolol tartrate (LOPRESSOR) 25 MG tablet Take 1 tablet (25 mg total) by mouth 2 (two) times daily. 06/27/18  Yes Barton Dubois, MD  Misc Natural Products (TART CHERRY ADVANCED PO) Take 1 tablet by mouth daily.    Yes [provider]  nabumetone (RELAFEN) 750 MG tablet Take 750 mg by mouth daily.    Yes [provider]  NON FORMULARY at bedtime. VPAP   Yes [provider]  potassium chloride (K-DUR) 10 MEQ tablet Take 10 mEq daily as needed by mouth (with the lasix for fluid retention.). Take with Lasix 12/31/14  Yes  [provider]  simvastatin (ZOCOR) 40 MG tablet Take 40 mg by mouth at bedtime.   Yes [provider]  tamsulosin (FLOMAX) 0.4 MG CAPS capsule Take 1 capsule (0.4 mg total) by mouth in the morning and at bedtime. Take 1 capsule (0.4 mg) by mouth daily with supper (~1830) & take 1 capsule (0.4 mg) by mouth at bedtime. 12/19/19  Yes McKenzie, Candee Furbish, MD  Zinc 50 MG TABS Take by mouth.   Yes [provider]    Current Facility-Administered Medications  Medication Dose Route Frequency Provider Last Rate Last Admin  . 0.9 %  sodium chloride infusion  250 mL Intravenous PRN Waldemar Dickens, MD      . acetaminophen (TYLENOL) tablet 650 mg  650 mg Oral Q6H PRN Waldemar Dickens, MD       Or  . acetaminophen (TYLENOL) suppository 650 mg  650 mg Rectal Q6H PRN Waldemar Dickens, MD      . ALPRAZolam Duanne Moron) tablet 0.5 mg  0.5 mg Oral QHS Waldemar Dickens, MD   0.5 mg at 03/03/20 2133  . brimonidine (ALPHAGAN) 0.15 % ophthalmic solution 1 drop  1 drop Left Eye BID Waldemar Dickens, MD   1 drop at 03/04/20 0953  . finasteride (PROSCAR) tablet 5 mg  5 mg Oral Q supper Waldemar Dickens, MD      . furosemide (LASIX) injection 20 mg  20 mg Intravenous Q12H Oswald Hillock, MD   20 mg at 03/04/20 1433  . ondansetron (ZOFRAN) tablet 4 mg  4 mg Oral Q6H PRN Waldemar Dickens, MD       Or  . ondansetron [facility])  injection 4 mg  4 mg Intravenous Q6H PRN Waldemar Dickens, MD      . potassium chloride SA (KLOR-CON) CR tablet 40 mEq  40 mEq Oral Daily Waldemar Dickens, MD   40 mEq at 03/04/20 0856  . simvastatin (ZOCOR) tablet 40 mg  40 mg Oral QHS Waldemar Dickens, MD      . sodium chloride flush (NS) 0.9 % injection 3 mL  3 mL Intravenous Q12H Waldemar Dickens, MD   3 mL at 03/04/20 0900  . sodium chloride flush (NS) 0.9 % injection 3 mL  3 mL Intravenous PRN Waldemar Dickens, MD      . tamsulosin [facility]) capsule 0.4 mg  0.4 mg Oral QPC supper Oswald Hillock, MD        Allergies as of 03/03/2020  . (No Known Allergies)    Family History  Problem Relation Age of Onset  . Heart failure Mother        also arthritis  . Stroke Father        also emphysema  . Heart failure Father     Social History   Socioeconomic History  . Marital status: Married    Spouse name: Not on file  . Number of children: 1  . Years of education: Not on file  . Highest education level: Not on file  Occupational History  . Not on file  Tobacco Use  . Smoking status: Never Smoker  . Smokeless tobacco: Never Used  Vaping Use  . Vaping Use: Never used  Substance and Sexual Activity  . Alcohol use: No  . Drug use: No  . Sexual activity: Not on file  Other Topics Concern  . Not on file  Social History Narrative  . Not on file   Social  Determinants of Health   Financial Resource Strain:   . Difficulty of Paying Living Expenses: Not on file  Food Insecurity:   . Worried About Charity fundraiser in the Last Year: Not on file  . Ran Out of Food in the Last Year: Not on file  Transportation Needs:   . Lack of Transportation (Medical): Not on file  . Lack of Transportation (Non-Medical): Not on file  Physical Activity:   . Days of Exercise per Week: Not on file  . Minutes of Exercise per Session: Not on file  Stress:   . Feeling of Stress : Not on file  Social Connections:   . Frequency of Communication  with Friends and Family: Not on file  . Frequency of Social Gatherings with Friends and Family: Not on file  . Attends Religious Services: Not on file  . Active Member of Clubs or Organizations: Not on file  . Attends Archivist Meetings: Not on file  . Marital Status: Not on file  Intimate Partner Violence:   . Fear of Current or Ex-Partner: Not on file  . Emotionally Abused: Not on file  . Physically Abused: Not on file  . Sexually Abused: Not on file    Review of Systems: See HPI, otherwise normal ROS  Physical Exam: Temp:  [97.8 F (36.6 C)-98.3 F (36.8 C)] 98.3 F (36.8 C) (09/29 1400) Pulse Rate:  [37-107] 60 (09/29 1400) Resp:  [7-24] 18 (09/29 1400) BP: (83-140)/(31-91) 83/58 (09/29 1400) SpO2:  [92 %-100 %] 100 % (09/29 1400) Last BM Date: 03/03/20   Patient is alert and in no acute distress Conjunctiva is pale.  Sclerae nonicteric. Oropharyngeal mucosa is normal. Neck without adenopathy masses or thyromegaly. Cardiac exam with regular rhythm normal S1 and S2.  No murmur gallop noted. Auscultation lungs reveal vesicular breath sounds bilaterally. Abdomen is full.  Bowel sounds are normal.  On palpation abdomen is soft and nontender with no organomegaly or masses. He has trace pitting edema around his ankles.   Lab Results: Recent Labs    03/03/20 1248 03/03/20 1606 03/04/20 0323  WBC 5.3 5.2 5.0  HGB 7.9* 7.9* 8.7*  HCT 27.4* 27.6* 30.0*  PLT 148* 152 120*   BMET Recent Labs    03/03/20 1248 03/03/20 1606 03/04/20 0323  NA 138 137 138  K 4.3 4.0 4.2  CL 102 101 104  CO2 28 27 27   GLUCOSE 105* 114* 93  BUN 28* 28* 25*  CREATININE 1.16 1.16 1.04  CALCIUM 8.7* 8.5* 8.5*   LFT Recent Labs    03/03/20 1606  PROT 6.3*  ALBUMIN 3.5  AST 14*  ALT 10  ALKPHOS 79  BILITOT 0.8    Studies/Results:  Portable chest 1 view on 03/03/2020 Borderline cardiomegaly but no evidence of CHF  Assessment;  Patient is 79 year old Caucasian  male who has coronary artery disease and atrial fibrillation who is anticoagulated on Eliquis apixaban who is also taking Relafen/name rheumatoid for arthritis who presents with progressive weakness and postural symptoms/presyncope and found to be anemic.  His stool is guaiac positive.  His stools have been dark intermittently for several weeks if not months.  He has received 1 unit of PRBCs and feels better. Given this history need to rule out peptic ulcer disease.  If EGD is unremarkable he would undergo colonoscopy. Patient's anticoagulant is on hold.  I did talk with patient's daughter over the phone and brought her up-to-date on her dad's condition.  Recommendations;  Diagnostic esophagogastroduodenoscopy on 03/05/2020. Commend no NSAID therapy while he is on anticoagulant.   LOS: 1 day   Kimari Coudriet  03/04/2020, 5:51 PM

## 2020-03-04 NOTE — Progress Notes (Signed)
[facility]  PROGRESS NOTE  Paul Lam JKD:[id number] DOB: 04-21-1941 DOA: 03/03/2020 PCP: [name], MD   Brief HPI:   79 year old male with history of nonobstructive CAD, A. fib, nephrolithiasis, hyperlipidemia, hypertension, OSA was sent from cardiology office after his labs showed hemoglobin 7.9.  Patient complained of worsening bouts of dizziness and near syncope.  He was seen by PCP 5 days ago and Lasix was increased from 20 mg to 40 mg p.o. twice daily due to fluid overload/lower extremity edema.  Patient blood pressure was soft 09X to 83J systolic.  He denies black stool or dark tarry stool. In the ED stool guaiac was positive, patient started on Protonix and  given 1 unit PRBC.   Subjective   Patient seen and examined, denies nausea vomiting.  No abdominal pain.Hemoglobin this morning is 8.7.   Assessment/Plan:     1. Symptomatic anemia-patient baseline hemoglobin was 12.2.  He presented with hemoglobin of 7.9.  Also had near syncope though he was also hypotensive from Lasix.  Home Eliquis is currently on hold.  Also hold Relafen.  Hemoccult is positive in the ED.  Will consult GI for further evaluation. 2. Acute diastolic CHF-EF 82%.  He did not respond to increased dose of Lasix at home.  We will start with Lasix 20 mg IV every 12 hours.  Follow BMP in am. 3. Atrial fibrillation-continue metoprolol, Eliquis is on hold due to GI bleed. 4. Hypertension-continue to hold lisinopril. 5. Hyperlipidemia-continue statin. 6. BPH-continue Proscar, Flomax at bedtime. 7. Anxiety-continue Xanax.     Lab Results  Component Value Date   [place] NEGATIVE 03/03/2020     Scheduled medications:    ALPRAZolam  0.5 mg Oral QHS   brimonidine  1 drop Left Eye BID   finasteride  5 mg Oral Q supper   potassium chloride  40 mEq Oral Daily   simvastatin  40 mg Oral QHS   sodium chloride flush  3 mL Intravenous Q12H   tamsulosin  0.4 mg Oral QPC supper        SpO2: 100 %    CBC: Recent Labs  Lab 03/03/20 1248 03/03/20 1606 03/04/20 0323  WBC 5.3 5.2 5.0  NEUTROABS  --  3.1  --   HGB 7.9* 7.9* 8.7*  HCT 27.4* 27.6* 30.0*  MCV 86.2 86.0 86.0  PLT 148* 152 120*    Basic Metabolic Panel: Recent Labs  Lab 03/03/20 1248 03/03/20 1606 03/04/20 0323  NA 138 137 138  K 4.3 4.0 4.2  CL 102 101 104  CO2 28 27 27   GLUCOSE 105* 114* 93  BUN 28* 28* 25*  CREATININE 1.16 1.16 1.04  CALCIUM 8.7* 8.5* 8.5*     Liver Function Tests: Recent Labs  Lab 03/03/20 1606  AST 14*  ALT 10  ALKPHOS 79  BILITOT 0.8  PROT 6.3*  ALBUMIN 3.5     Antibiotics: Anti-infectives (From admission, onward)   None       DVT prophylaxis: SCDs  Code Status: Full code  Family Communication: No family at bedside    Status is: Inpatient  Dispo: The patient is from: Home              Anticipated d/c is to: Home              Anticipated d/c date is: 03/05/2020              Patient currently not medically stable for discharge  Barrier to discharge-pending GI  evaluation      Consultants:  None  Procedures:  None   Objective   Vitals:   03/04/20 0900 03/04/20 0930 03/04/20 1000 03/04/20 1046  BP: 138/68 140/80 (!) 130/52 135/69  Pulse: (!) 49 69 72 (!) 55  Resp: 15 16 15 16   Temp:    97.8 F (36.6 C)  TempSrc:    Oral  SpO2: 100% 100% 100% 100%  Weight:      Height:        Intake/Output Summary (Last 24 hours) at 03/04/2020 1223 Last data filed at 03/04/2020 0900 Gross per 24 hour  Intake 630 ml  Output 600 ml  Net 30 ml    09/27 1901 - 09/29 0700 In: 630  Out: 200 [Urine:200]  Filed Weights   03/03/20 1527  Weight: 102.7 kg    Physical Examination:    General: Appears in no acute distress  Cardiovascular: S1-S2, regular, no murmur auscultated  Respiratory: Clear to auscultation bilaterally, no wheezing or crackles auscultated  Abdomen: Abdomen is soft, nontender, no  organomegaly  Extremities: No edema in the lower extremities  Neurologic: Cranial nerves II through XII grossly intact, no focal deficit noted    Data Reviewed:   Recent Results (from the past 240 hour(s))  Respiratory Panel by RT PCR (Flu A&B, Covid) - Nasopharyngeal Swab     Status: None   Collection Time: 03/03/20  6:30 PM   Specimen: Nasopharyngeal Swab  Result Value Ref Range Status   SARS Coronavirus 2 by RT PCR NEGATIVE NEGATIVE Final    Comment: (NOTE) SARS-CoV-2 target nucleic acids are NOT DETECTED.  The SARS-CoV-2 RNA is generally detectable in upper respiratoy specimens during the acute phase of infection. The lowest concentration of SARS-CoV-2 viral copies this assay can detect is 131 copies/mL. A negative result does not preclude SARS-Cov-2 infection and should not be used as the sole basis for treatment or other patient management decisions. A negative result may occur with  improper specimen collection/handling, submission of specimen other than nasopharyngeal swab, presence of viral mutation(s) within the areas targeted by this assay, and inadequate number of viral copies (<131 copies/mL). A negative result must be combined with clinical observations, patient history, and epidemiological information. The expected result is Negative.  Fact Sheet for Patients:  PinkCheek.be  Fact Sheet for Healthcare Providers:  GravelBags.it  This test is no t yet approved or cleared by the [place] FDA and  has been authorized for detection and/or diagnosis of SARS-CoV-2 by FDA under an Emergency Use Authorization (EUA). This EUA will remain  in effect (meaning this test can be used) for the duration of the COVID-19 declaration under Section 564(b)(1) of the Act, 21 U.S.C. section 360bbb-3(b)(1), unless the authorization is terminated or revoked sooner.     Influenza A by PCR NEGATIVE NEGATIVE Final    Influenza B by PCR NEGATIVE NEGATIVE Final    Comment: (NOTE) The Xpert Xpress SARS-CoV-2/FLU/RSV assay is intended as an aid in  the diagnosis of influenza from Nasopharyngeal swab specimens and  should not be used as a sole basis for treatment. Nasal washings and  aspirates are unacceptable for Xpert Xpress SARS-CoV-2/FLU/RSV  testing.  Fact Sheet for Patients: PinkCheek.be  Fact Sheet for Healthcare Providers: GravelBags.it  This test is not yet approved or cleared by the [place] FDA and  has been authorized for detection and/or diagnosis of SARS-CoV-2 by  FDA under an Emergency Use Authorization (EUA). This EUA will remain  in effect (meaning  this test can be used) for the duration of the  Covid-19 declaration under Section 564(b)(1) of the Act, 21  U.S.C. section 360bbb-3(b)(1), unless the authorization is  terminated or revoked. Performed at [facility], [address] [place], [place] [address]     BNP (last 3 results) Recent Labs    03/03/20 1606  BNP 117.0*     Studies:  DG Tibia/Fibula Right  Result Date: 03/03/2020 CLINICAL DATA:  Post fall with anterior lower leg tenderness and bruising. EXAM: RIGHT TIBIA AND FIBULA - 2 VIEW COMPARISON:  None. FINDINGS: Total knee arthroplasty is intact. There is no evidence of fracture or other focal bone lesions. Ankle alignment is maintained. There is generalized soft tissue edema throughout the lower extremity. Plantar calcaneal spur and Achilles tendon enthesophyte. IMPRESSION: Soft tissue edema without acute fracture of the right lower leg. Electronically Signed   By: Keith Rake M.D.   On: 03/03/2020 21:19   DG Chest Portable 1 View  Result Date: 03/03/2020 CLINICAL DATA:  Leg swelling.  History of CHF. EXAM: PORTABLE CHEST 1 VIEW COMPARISON:  06/25/2018 FINDINGS: Midline trachea. Borderline cardiomegaly. Atherosclerosis in the transverse aorta. No pleural  effusion or pneumothorax. No congestive failure. IMPRESSION: Borderline cardiomegaly, without acute disease. Aortic Atherosclerosis (ICD10-I70.0). Electronically Signed   By: Abigail Miyamoto M.D.   On: 03/03/2020 19:33       [place]   [facility] If 7PM-7AM, please contact night-coverage at [email or web address], Office  [phone]   03/04/2020, 12:23 PM  LOS: 1 day

## 2020-03-04 NOTE — Progress Notes (Signed)
  Echocardiogram 2D Echocardiogram has been performed.  Paul Lam 03/04/2020, 12:50 PM

## 2020-03-05 ENCOUNTER — Inpatient Hospital Stay (HOSPITAL_COMMUNITY): Payer: Medicare Other | Admitting: Certified Registered"

## 2020-03-05 ENCOUNTER — Encounter (HOSPITAL_COMMUNITY): Payer: Self-pay | Admitting: Family Medicine

## 2020-03-05 ENCOUNTER — Encounter (HOSPITAL_COMMUNITY)
Admission: EM | Disposition: A | Discharge status: Home / Self Care | Payer: Self-pay | Source: Home / Self Care | Attending: Family Medicine

## 2020-03-05 DIAGNOSIS — I4891 Unspecified atrial fibrillation: Secondary | ICD-10-CM | POA: Diagnosis not present

## 2020-03-05 DIAGNOSIS — I5031 Acute diastolic (congestive) heart failure: Secondary | ICD-10-CM | POA: Diagnosis not present

## 2020-03-05 DIAGNOSIS — E7849 Other hyperlipidemia: Secondary | ICD-10-CM | POA: Diagnosis not present

## 2020-03-05 DIAGNOSIS — K228 Other specified diseases of esophagus: Secondary | ICD-10-CM

## 2020-03-05 DIAGNOSIS — D649 Anemia, unspecified: Secondary | ICD-10-CM | POA: Diagnosis not present

## 2020-03-05 DIAGNOSIS — Z6834 Body mass index (BMI) 34.0-34.9, adult: Secondary | ICD-10-CM | POA: Diagnosis not present

## 2020-03-05 DIAGNOSIS — E6609 Other obesity due to excess calories: Secondary | ICD-10-CM | POA: Diagnosis not present

## 2020-03-05 DIAGNOSIS — I482 Chronic atrial fibrillation, unspecified: Secondary | ICD-10-CM | POA: Diagnosis not present

## 2020-03-05 DIAGNOSIS — K449 Diaphragmatic hernia without obstruction or gangrene: Secondary | ICD-10-CM

## 2020-03-05 HISTORY — PX: ESOPHAGOGASTRODUODENOSCOPY (EGD) WITH PROPOFOL: SHX5813

## 2020-03-05 HISTORY — PX: BIOPSY: SHX5522

## 2020-03-05 LAB — CBC
HCT: 29.9 % — ABNORMAL LOW (ref 39.0–52.0)
Hemoglobin: 9 g/dL — ABNORMAL LOW (ref 13.0–17.0)
MCH: 25.6 pg — ABNORMAL LOW (ref 26.0–34.0)
MCHC: 30.1 g/dL (ref 30.0–36.0)
MCV: 85.2 fL (ref 80.0–100.0)
Platelets: 131 10*3/uL — ABNORMAL LOW (ref 150–400)
RBC: 3.51 MIL/uL — ABNORMAL LOW (ref 4.22–5.81)
RDW: 15 % (ref 11.5–15.5)
WBC: 4.8 10*3/uL (ref 4.0–10.5)
nRBC: 0 % (ref 0.0–0.2)

## 2020-03-05 LAB — BASIC METABOLIC PANEL
Anion gap: 8 (ref 5–15)
BUN: 22 mg/dL (ref 8–23)
CO2: 29 mmol/L (ref 22–32)
Calcium: 8.8 mg/dL — ABNORMAL LOW (ref 8.9–10.3)
Chloride: 102 mmol/L (ref 98–111)
Creatinine, Ser: 1.04 mg/dL (ref 0.61–1.24)
GFR calc Af Amer: >60 mL/min (ref >60)
GFR calc non Af Amer: >60 mL/min (ref >60)
Glucose, Bld: 100 mg/dL — ABNORMAL HIGH (ref 70–99)
Potassium: 4.3 mmol/L (ref 3.5–5.1)
Sodium: 139 mmol/L (ref 135–145)

## 2020-03-05 SURGERY — ESOPHAGOGASTRODUODENOSCOPY (EGD) WITH PROPOFOL
Anesthesia: General

## 2020-03-05 MED ORDER — PEG 3350-KCL-NA BICARB-NACL 420 G PO SOLR
4000.0000 mL | Freq: Once | ORAL | Status: AC
  Administered 2020-03-05: 4000 mL via ORAL

## 2020-03-05 MED ORDER — LIDOCAINE HCL (CARDIAC) PF 100 MG/5ML IV SOSY
PREFILLED_SYRINGE | INTRAVENOUS | Status: DC | PRN
  Administered 2020-03-05: 30 mg via INTRAVENOUS
  Administered 2020-03-05: 70 mg via INTRAVENOUS

## 2020-03-05 MED ORDER — LACTATED RINGERS IV SOLN: INTRAVENOUS | Status: DC | PRN

## 2020-03-05 MED ORDER — STERILE WATER FOR IRRIGATION IR SOLN
Status: DC | PRN
  Administered 2020-03-05: 100 mL

## 2020-03-05 MED ORDER — LIDOCAINE VISCOUS HCL 2 % MT SOLN
OROMUCOSAL | Status: AC
  Filled 2020-03-05: qty 15

## 2020-03-05 MED ORDER — SODIUM CHLORIDE 0.9 % IV SOLN: INTRAVENOUS | Status: DC

## 2020-03-05 MED ORDER — LACTATED RINGERS IV SOLN: Freq: Once | INTRAVENOUS | Status: AC

## 2020-03-05 MED ORDER — LIDOCAINE VISCOUS HCL 2 % MT SOLN
15.0000 mL | Freq: Once | OROMUCOSAL | Status: AC
  Administered 2020-03-05: 15 mL via OROMUCOSAL

## 2020-03-05 MED ORDER — PROPOFOL 10 MG/ML IV BOLUS
INTRAVENOUS | Status: DC | PRN
  Administered 2020-03-05: 70 mg via INTRAVENOUS

## 2020-03-05 MED ORDER — PROPOFOL 500 MG/50ML IV EMUL
INTRAVENOUS | Status: DC | PRN
  Administered 2020-03-05: 100 ug/kg/min via INTRAVENOUS

## 2020-03-05 NOTE — Progress Notes (Signed)
Subjective:  Has no complaints.  He has not experienced melena or rectal bleeding.  He denies chest pain or shortness of breath.  Current Medications:  Current Facility-Administered Medications:  .  [MAR Hold] 0.9 %  sodium chloride infusion, 250 mL, Intravenous, PRN, Waldemar Dickens, MD .  0.9 %  sodium chloride infusion, , Intravenous, Continuous, Breyton Vanscyoc, Mechele Dawley, MD .  Doug Sou Hold] acetaminophen (TYLENOL) tablet 650 mg, 650 mg, Oral, Q6H PRN **OR** [MAR Hold] acetaminophen (TYLENOL) suppository 650 mg, 650 mg, Rectal, Q6H PRN, Waldemar Dickens, MD .  Doug Sou Hold] ALPRAZolam Duanne Moron) tablet 0.5 mg, 0.5 mg, Oral, QHS, Waldemar Dickens, MD, 0.5 mg at 03/04/20 2209 .  [MAR Hold] brimonidine (ALPHAGAN) 0.15 % ophthalmic solution 1 drop, 1 drop, Left Eye, BID, Waldemar Dickens, MD, 1 drop at 03/05/20 0906 .  [MAR Hold] finasteride (PROSCAR) tablet 5 mg, 5 mg, Oral, Q supper, Waldemar Dickens, MD, 5 mg at 03/04/20 1852 .  [MAR Hold] furosemide (LASIX) injection 20 mg, 20 mg, Intravenous, Q12H, Darrick Meigs, Marge Duncans, MD, 20 mg at 03/05/20 0044 .  [MAR Hold] ondansetron (ZOFRAN) tablet 4 mg, 4 mg, Oral, Q6H PRN **OR** [MAR Hold] ondansetron (ZOFRAN) injection 4 mg, 4 mg, Intravenous, Q6H PRN, Waldemar Dickens, MD .  Doug Sou Hold] potassium chloride SA (KLOR-CON) CR tablet 40 mEq, 40 mEq, Oral, Daily, Waldemar Dickens, MD, 40 mEq at 03/05/20 0907 .  [MAR Hold] simvastatin (ZOCOR) tablet 40 mg, 40 mg, Oral, QHS, Waldemar Dickens, MD, 40 mg at 03/04/20 2206 .  [MAR Hold] sodium chloride flush (NS) 0.9 % injection 3 mL, 3 mL, Intravenous, Q12H, Waldemar Dickens, MD, 3 mL at 03/05/20 0910 .  [MAR Hold] sodium chloride flush (NS) 0.9 % injection 3 mL, 3 mL, Intravenous, PRN, Waldemar Dickens, MD .  Doug Sou Hold] tamsulosin [facility]) capsule 0.4 mg, 0.4 mg, Oral, QPC supper, Darrick Meigs, Gagan S, MD, 0.4 mg at 03/04/20 8546  Facility-Administered Medications Ordered in Other Encounters:  .  lactated ringers infusion, ,  Intravenous, Continuous PRN, Orlie Dakin, CRNA, New Bag at 03/05/20 1025 *  Objective: Blood pressure (!) 129/57, pulse 76, temperature 98.9 F (37.2 C), temperature source Oral, resp. rate 17, height 5' 8"  (1.727 m), weight 99.1 kg, SpO2 99 %. Patient is alert and in no acute distress. Conjunctiva is pink.  Sclerae nonicteric. Cardiac exam with regular rhythm normal S1 and S2.  No murmur gallop noted. Auscultation lungs reveal vesicular breath sounds bilaterally. Abdomen is full but soft and nontender without organomegaly or masses. Patient has pitting and nonpitting pretibial edema.  Labs/studies Results:  CBC Latest Ref Rng & Units 03/05/2020 03/04/2020 03/03/2020  WBC 4.0 - 10.5 K/uL 4.8 5.0 5.2  Hemoglobin 13.0 - 17.0 g/dL 9.0(L) 8.7(L) 7.9(L)  Hematocrit 39 - 52 % 29.9(L) 30.0(L) 27.6(L)  Platelets 150 - 400 K/uL 131(L) 120(L) 152    CMP Latest Ref Rng & Units 03/05/2020 03/04/2020 03/03/2020  Glucose 70 - 99 mg/dL 100(H) 93 114(H)  BUN 8 - 23 mg/dL 22 25(H) 28(H)  Creatinine 0.61 - 1.24 mg/dL 1.04 1.04 1.16  Sodium 135 - 145 mmol/L 139 138 137  Potassium 3.5 - 5.1 mmol/L 4.3 4.2 4.0  Chloride 98 - 111 mmol/L 102 104 101  CO2 22 - 32 mmol/L 29 27 27   Calcium 8.9 - 10.3 mg/dL 8.8(L) 8.5(L) 8.5(L)  Total Protein 6.5 - 8.1 g/dL - - 6.3(L)  Total Bilirubin 0.3 - 1.2 mg/dL - - 0.8  Alkaline Phos 38 - 126 U/L - - 79  AST 15 - 41 U/L - - 14(L)  ALT 0 - 44 U/L - - 10    Hepatic Function Latest Ref Rng & Units 03/03/2020 06/25/2018 08/15/2011  Total Protein 6.5 - 8.1 g/dL 6.3(L) 7.4 7.6  Albumin 3.5 - 5.0 g/dL 3.5 4.2 4.0  AST 15 - 41 U/L 14(L) 18 16  ALT 0 - 44 U/L 10 8 8   Alk Phosphatase 38 - 126 U/L 79 75 95  Total Bilirubin 0.3 - 1.2 mg/dL 0.8 1.5(H) 0.5      Assessment:  #1.  GI bleed possibly from upper source given that patient has been taking NSAIDs.  He has received 1 unit of PRBCs.  Hemoglobin is up to 9 g.  No evidence of active bleeding.  #2.  Chronic atrial  fibrillation.  Eliquis is on hold.  Plan:  Proceed with esophagogastroduodenoscopy monitored anesthesia care. Patient's questions answered.  He is agreeable proceed.

## 2020-03-05 NOTE — Progress Notes (Signed)
[facility]  PROGRESS NOTE  [name] QAS:[id number] DOB: 03-Feb-1941 DOA: 03/03/2020 PCP: [name], MD   Brief HPI:   79 year old male with history of nonobstructive CAD, A. fib, nephrolithiasis, hyperlipidemia, hypertension, OSA was sent from cardiology office after his labs showed hemoglobin 7.9.  Patient complained of worsening bouts of dizziness and near syncope.  He was seen by PCP 5 days ago and Lasix was increased from 20 mg to 40 mg p.o. twice daily due to fluid overload/lower extremity edema.  Patient blood pressure was soft 79G to 92J systolic.  He denies black stool or dark tarry stool. In the ED stool guaiac was positive, patient started on Protonix and  given 1 unit PRBC.   Subjective   Patient seen and examined, underwent EGD today which showed no bleeding lesion.   Assessment/Plan:     1. Symptomatic anemia-patient baseline hemoglobin was 12.2.  He presented with hemoglobin of 7.9.  Also had near syncope though he was also hypotensive from Lasix.  Home Eliquis is currently on hold.  Also hold Relafen.  Hemoccult was positive in the ED. s/p 1 unit PRBC transfusion.  This morning hemoglobin is 9.0. 2. GI bleed-EGD showed no significant source of bleeding.  Plan for colonoscopy in a.m.  GI following. 3. Acute diastolic CHF-EF 19%.  He did not respond to increased dose of po Lasix at home.  Started on IV Lasix 20 mg every 12 hours.  Blood pressure is stable.  Follow BMP in am. 4. Atrial fibrillation-continue metoprolol, Eliquis is on hold due to GI bleed. 5. Hypertension-continue to hold lisinopril. 6. Hyperlipidemia-continue statin. 7. BPH-continue Proscar, Flomax at bedtime. 8. Anxiety-continue Xanax.     Lab Results  Component Value Date   [place] NEGATIVE 03/03/2020     Scheduled medications:    ALPRAZolam  0.5 mg Oral QHS   brimonidine  1 drop Left Eye BID   finasteride  5 mg Oral Q supper   furosemide  20 mg Intravenous Q12H    polyethylene glycol-electrolytes  4,000 mL Oral Once   potassium chloride  40 mEq Oral Daily   simvastatin  40 mg Oral QHS   sodium chloride flush  3 mL Intravenous Q12H   tamsulosin  0.4 mg Oral QPC supper       SpO2: 100 %    CBC: Recent Labs  Lab 03/03/20 1248 03/03/20 1606 03/04/20 0323 03/05/20 0625  WBC 5.3 5.2 5.0 4.8  NEUTROABS  --  3.1  --   --   HGB 7.9* 7.9* 8.7* 9.0*  HCT 27.4* 27.6* 30.0* 29.9*  MCV 86.2 86.0 86.0 85.2  PLT 148* 152 120* 131*    Basic Metabolic Panel: Recent Labs  Lab 03/03/20 1248 03/03/20 1606 03/04/20 0323 03/05/20 0625  NA 138 137 138 139  K 4.3 4.0 4.2 4.3  CL 102 101 104 102  CO2 28 27 27 29   GLUCOSE 105* 114* 93 100*  BUN 28* 28* 25* 22  CREATININE 1.16 1.16 1.04 1.04  CALCIUM 8.7* 8.5* 8.5* 8.8*     Liver Function Tests: Recent Labs  Lab 03/03/20 1606  AST 14*  ALT 10  ALKPHOS 79  BILITOT 0.8  PROT 6.3*  ALBUMIN 3.5     Antibiotics: Anti-infectives (From admission, onward)   None       DVT prophylaxis: SCDs  Code Status: Full code  Family Communication: No family at bedside    Status is: Inpatient  Dispo: The patient is from: Home  Anticipated d/c is to: Home              Anticipated d/c date is: 03/06/2020              Patient currently not medically stable for discharge  Barrier to discharge-pending colonoscopy in a.m.      Consultants:  None  Procedures:  None   Objective   Vitals:   03/05/20 1130 03/05/20 1145 03/05/20 1300 03/05/20 1355  BP: 119/62 126/66 134/76 (!) 124/53  Pulse: 70 60 68 64  Resp: (!) 25 14 16 15   Temp:   98.6 F (37 C) 98.3 F (36.8 C)  TempSrc:   Oral   SpO2: 99% 100% 98% 100%  Weight:      Height:        Intake/Output Summary (Last 24 hours) at 03/05/2020 1632 Last data filed at 03/05/2020 1115 Gross per 24 hour  Intake 400 ml  Output --  Net 400 ml    09/28 1901 - 09/30 0700 In: 630  Out: 600 [Urine:600]  Filed  Weights   03/03/20 1527 03/05/20 0500  Weight: 102.7 kg 99.1 kg    Physical Examination:    General-appears in no acute distress  Heart-S1-S2, regular, no murmur auscultated  Lungs-clear to auscultation bilaterally, no wheezing or crackles auscultated  Abdomen-soft, nontender, no organomegaly  Extremities-no edema in the lower extremities  Neuro-alert, oriented x3, no focal deficit noted    Data Reviewed:   Recent Results (from the past 240 hour(s))  Respiratory Panel by RT PCR (Flu A&B, Covid) - Nasopharyngeal Swab     Status: None   Collection Time: 03/03/20  6:30 PM   Specimen: Nasopharyngeal Swab  Result Value Ref Range Status   SARS Coronavirus 2 by RT PCR NEGATIVE NEGATIVE Final    Comment: (NOTE) SARS-CoV-2 target nucleic acids are NOT DETECTED.  The SARS-CoV-2 RNA is generally detectable in upper respiratoy specimens during the acute phase of infection. The lowest concentration of SARS-CoV-2 viral copies this assay can detect is 131 copies/mL. A negative result does not preclude SARS-Cov-2 infection and should not be used as the sole basis for treatment or other patient management decisions. A negative result may occur with  improper specimen collection/handling, submission of specimen other than nasopharyngeal swab, presence of viral mutation(s) within the areas targeted by this assay, and inadequate number of viral copies (<131 copies/mL). A negative result must be combined with clinical observations, patient history, and epidemiological information. The expected result is Negative.  Fact Sheet for Patients:  PinkCheek.be  Fact Sheet for Healthcare Providers:  GravelBags.it  This test is no t yet approved or cleared by the [place] FDA and  has been authorized for detection and/or diagnosis of SARS-CoV-2 by FDA under an Emergency Use Authorization (EUA). This EUA will remain  in effect  (meaning this test can be used) for the duration of the COVID-19 declaration under Section 564(b)(1) of the Act, 21 U.S.C. section 360bbb-3(b)(1), unless the authorization is terminated or revoked sooner.     Influenza A by PCR NEGATIVE NEGATIVE Final   Influenza B by PCR NEGATIVE NEGATIVE Final    Comment: (NOTE) The Xpert Xpress SARS-CoV-2/FLU/RSV assay is intended as an aid in  the diagnosis of influenza from Nasopharyngeal swab specimens and  should not be used as a sole basis for treatment. Nasal washings and  aspirates are unacceptable for Xpert Xpress SARS-CoV-2/FLU/RSV  testing.  Fact Sheet for Patients: PinkCheek.be  Fact Sheet for Healthcare Providers: GravelBags.it  This test is not yet approved or cleared by the [place] and  has been authorized for detection and/or diagnosis of SARS-CoV-2 by  FDA under an Emergency Use Authorization (EUA). This EUA will remain  in effect (meaning this test can be used) for the duration of the  Covid-19 declaration under Section 564(b)(1) of the Act, 21  U.S.C. section 360bbb-3(b)(1), unless the authorization is  terminated or revoked. Performed at [facility], [address], [place], [place] [address]     BNP (last 3 results) Recent Labs    03/03/20 1606  BNP 117.0*     Studies:  DG Tibia/Fibula Right  Result Date: 03/03/2020 CLINICAL DATA:  Post fall with anterior lower leg tenderness and bruising. EXAM: RIGHT TIBIA AND FIBULA - 2 VIEW COMPARISON:  None. FINDINGS: Total knee arthroplasty is intact. There is no evidence of fracture or other focal bone lesions. Ankle alignment is maintained. There is generalized soft tissue edema throughout the lower extremity. Plantar calcaneal spur and Achilles tendon enthesophyte. IMPRESSION: Soft tissue edema without acute fracture of the right lower leg. Electronically Signed   By: Keith Rake M.D.   On: 03/03/2020  21:19   DG Chest Portable 1 View  Result Date: 03/03/2020 CLINICAL DATA:  Leg swelling.  History of CHF. EXAM: PORTABLE CHEST 1 VIEW COMPARISON:  06/25/2018 FINDINGS: Midline trachea. Borderline cardiomegaly. Atherosclerosis in the transverse aorta. No pleural effusion or pneumothorax. No congestive failure. IMPRESSION: Borderline cardiomegaly, without acute disease. Aortic Atherosclerosis (ICD10-I70.0). Electronically Signed   By: Abigail Miyamoto M.D.   On: 03/03/2020 19:33   ECHOCARDIOGRAM COMPLETE  Result Date: 03/04/2020    ECHOCARDIOGRAM REPORT   Patient Name:   Paul Lam Date of Exam: 03/04/2020 Medical Rec #:  [id number]      Height:       68.0 in Accession #:    [id number]     Weight:       226.5 lb Date of Birth:  05-Jan-1941      BSA:          2.155 m Patient Age:    70 years       BP:           130/52 mmHg Patient Gender: M              HR:           53 bpm. Exam Location:  [facility] Procedure: 2D Echo, Cardiac Doppler and Color Doppler Indications:    Atrial Fibrillation 427.31 / I48.91  History:        Patient has prior history of Echocardiogram examinations, most                 recent 01/08/2018. CAD, Arrythmias:Atrial Fibrillation,                 Signs/Symptoms:Shortness of Breath; Risk Factors:Hypertension                 and Dyslipidemia.  Sonographer:    Bernadene Person RDCS Referring Phys: [place]  1. Left ventricular ejection fraction, by estimation, is 60 to 65%. The left ventricle has normal function. The left ventricle has no regional wall motion abnormalities. Left ventricular diastolic parameters are indeterminate.  2. Right ventricular systolic function is normal. The right ventricular size is normal. There is normal pulmonary artery systolic pressure. The estimated right ventricular systolic pressure is 24.2 mmHg.  3. Left atrial size was moderately dilated.  4.  The mitral valve is abnormal, mildly thickened and calcified. Mild mitral valve regurgitation.   5. The aortic valve is tricuspid. There is moderate calcification of the aortic valve. Aortic valve regurgitation is mild. Mild to moderate aortic valve stenosis. Aortic regurgitation PHT measures 725 msec. Aortic valve mean gradient measures 11.3 mmHg.  Aortic valve Vmax measures 2.29 m/s.  6. The inferior vena cava is dilated in size with >50% respiratory variability, suggesting right atrial pressure of 8 mmHg. FINDINGS  Left Ventricle: Left ventricular ejection fraction, by estimation, is 60 to 65%. The left ventricle has normal function. The left ventricle has no regional wall motion abnormalities. The left ventricular internal cavity size was normal in size. There is  borderline left ventricular hypertrophy. Left ventricular diastolic parameters are indeterminate. Right Ventricle: The right ventricular size is normal. No increase in right ventricular wall thickness. Right ventricular systolic function is normal. There is normal pulmonary artery systolic pressure. The tricuspid regurgitant velocity is 2.45 m/s, and  with an assumed right atrial pressure of 8 mmHg, the estimated right ventricular systolic pressure is 10.1 mmHg. Left Atrium: Left atrial size was moderately dilated. Right Atrium: Right atrial size was normal in size. Pericardium: There is no evidence of pericardial effusion. Mitral Valve: The mitral valve is abnormal. There is mild thickening of the mitral valve leaflet(s). There is mild calcification of the mitral valve leaflet(s). Mild mitral annular calcification. Mild mitral valve regurgitation. Tricuspid Valve: The tricuspid valve is grossly normal. Tricuspid valve regurgitation is trivial. Aortic Valve: The aortic valve is tricuspid. There is moderate calcification of the aortic valve. There is mild aortic valve annular calcification. Aortic valve regurgitation is mild. Aortic regurgitation PHT measures 725 msec. Mild to moderate aortic stenosis is present. Aortic valve mean gradient measures  11.3 mmHg. Aortic valve peak gradient measures 21.0 mmHg. Aortic valve area, by VTI measures 1.16 cm. Pulmonic Valve: The pulmonic valve was grossly normal. Pulmonic valve regurgitation is trivial. Aorta: The aortic root is normal in size and structure. Venous: The inferior vena cava is dilated in size with greater than 50% respiratory variability, suggesting right atrial pressure of 8 mmHg. IAS/Shunts: No atrial level shunt detected by color flow Doppler.  LEFT VENTRICLE PLAX 2D LVIDd:         5.23 cm  Diastology LVIDs:         3.06 cm  LV e' medial:    8.70 cm/s LV PW:         1.04 cm  LV E/e' medial:  16.8 LV IVS:        1.06 cm  LV e' lateral:   7.27 cm/s LVOT diam:     2.10 cm  LV E/e' lateral: 20.1 LV SV:         63 LV SV Index:   29 LVOT Area:     3.46 cm  RIGHT VENTRICLE RV S prime:     9.82 cm/s TAPSE (M-mode): 1.9 cm LEFT ATRIUM             Index       RIGHT ATRIUM           Index LA diam:        4.60 cm 2.13 cm/m  RA Area:     21.40 cm LA Vol (A2C):   79.2 ml 36.76 ml/m RA Volume:   58.20 ml  27.01 ml/m LA Vol (A4C):   94.6 ml 43.90 ml/m LA Biplane Vol: 87.2 ml 40.47 ml/m  AORTIC VALVE AV  Area (Vmax):    1.46 cm AV Area (Vmean):   1.27 cm AV Area (VTI):     1.16 cm AV Vmax:           229.00 cm/s AV Vmean:          159.000 cm/s AV VTI:            0.545 m AV Peak Grad:      21.0 mmHg AV Mean Grad:      11.3 mmHg LVOT Vmax:         96.80 cm/s LVOT Vmean:        58.300 cm/s LVOT VTI:          0.182 m LVOT/AV VTI ratio: 0.33 AI PHT:            725 msec  AORTA Ao Root diam: 3.50 cm Ao Asc diam:  2.40 cm MITRAL VALVE                TRICUSPID VALVE MV Area (PHT): 3.45 cm     TR Peak grad:   24.0 mmHg MV Decel Time: 220 msec     TR Vmax:        245.00 cm/s MV E velocity: 146.00 cm/s MV A velocity: 70.40 cm/s   SHUNTS MV E/A ratio:  2.07         Systemic VTI:  0.18 m                             Systemic Diam: 2.10 cm Rozann Lesches MD Electronically signed by Rozann Lesches MD Signature Date/Time:  03/04/2020/3:45:14 PM    Final        Oswald Hillock   [facility] If 7PM-7AM, please contact night-coverage at [email or web address], Office  [phone]   03/05/2020, 4:32 PM  LOS: 2 days

## 2020-03-05 NOTE — Anesthesia Postprocedure Evaluation (Signed)
Anesthesia Post Note  Patient: Paul Lam  Procedure(s) Performed: ESOPHAGOGASTRODUODENOSCOPY (EGD) WITH PROPOFOL (N/A ) BIOPSY  Patient location during evaluation: PACU Anesthesia Type: General Level of consciousness: awake and alert and oriented Pain management: pain level controlled Vital Signs Assessment: post-procedure vital signs reviewed and stable Respiratory status: spontaneous breathing and respiratory function stable Cardiovascular status: blood pressure returned to baseline Postop Assessment: no apparent nausea or vomiting Anesthetic complications: no   No complications documented.   Last Vitals:  Vitals:   03/05/20 1145 03/05/20 1355  BP: 126/66 (!) 124/53  Pulse: 60 64  Resp: 14 15  Temp:  36.8 C  SpO2: 100% 100%    Last Pain:  Vitals:   03/05/20 1145  TempSrc:   PainSc: 0-No pain                 Rucker Pridgeon C Maisley Hainsworth

## 2020-03-05 NOTE — Plan of Care (Signed)
Patient telemetry monitor alarming SVT. Checked on patient, and he is dry heaving and having a bm at the same time (bowel prep). See MAR for update.

## 2020-03-05 NOTE — Progress Notes (Signed)
Brief EGD note.  Normal esophageal mucosa. Short rim of salmon-colored mucosa suspicious for Barrett's at distal esophagus.  Biopsy taken 2 cm sliding hiatal hernia. Normal examination of stomach, bulbar mucosa as well as second and third part of the duodenum.

## 2020-03-05 NOTE — Progress Notes (Signed)
EGD findings discussed with patient Since no bleeding lesion identified on esophagogastroduodenoscopy will proceed with colonoscopy. Patient is agreeable.  I also talk with Mrs. Coram earlier today. Patient is aware that procedure would be performed by Dr. Jenetta Downer tomorrow.

## 2020-03-05 NOTE — Op Note (Signed)
[facility] Patient Name: Paul Lam Procedure Date: 03/05/2020 9:02 AM MRN: [id number] Date of Birth: 05-20-1941 Attending MD: Hildred Laser , MD CSN: [id number] Age: 79 Admit Type: Inpatient Procedure:                Upper GI endoscopy Indications:              Iron deficiency anemia secondary to chronic blood                            loss Providers:                Hildred Laser, MD, Janeece Riggers, RN, Lambert Mody, Aram Candela, Wellington Risa Grill,                            Technician Referring MD:             Eleonore Chiquito, MD Medicines:                Propofol per Anesthesia Complications:            No immediate complications. Estimated Blood Loss:     Estimated blood loss was minimal. Procedure:                Pre-Anesthesia Assessment:                           - Prior to the procedure, a History and Physical                            was performed, and patient medications and                            allergies were reviewed. The patient's tolerance of                            previous anesthesia was also reviewed. The risks                            and benefits of the procedure and the sedation                            options and risks were discussed with the patient.                            All questions were answered, and informed consent                            was obtained. Prior Anticoagulants: The patient                            last took Eliquis (apixaban) 2 days prior to the  procedure. ASA Grade Assessment: III - A patient                            with severe systemic disease. After reviewing the                            risks and benefits, the patient was deemed in                            satisfactory condition to undergo the procedure.                           After obtaining informed consent, the endoscope was                            passed under direct vision.  Throughout the                            procedure, the patient's blood pressure, pulse, and                            oxygen saturations were monitored continuously. The                            [id number]-[id number] ([id number]) scope was introduced through the                            mouth, and advanced to the third part of duodenum.                            The upper GI endoscopy was accomplished without                            difficulty. The patient tolerated the procedure                            well. Scope In: 11:03:52 AM Scope Out: 11:12:04 AM Total Procedure Duration: 0 hours 8 minutes 12 seconds  Findings:      The hypopharynx was normal.      The proximal esophagus, mid esophagus and distal esophagus were normal.      There were esophageal mucosal changes suspicious for short-segment       Barrett's esophagus present in the distal esophagus. The maximum       longitudinal extent of these mucosal changes was 0.6 cm in length.       Mucosa was biopsied with a cold forceps for histology randomly. One       specimen bottle was sent to pathology.      The Z-line was irregular and was found 40 cm from the incisors.      A 2 cm hiatal hernia was present.      The entire examined stomach was normal.      The duodenal bulb, second portion of the duodenum and third portion of       the duodenum were normal. Impression:               -  Normal hypopharynx.                           - Normal proximal esophagus, mid esophagus and                            distal esophagus.                           - Esophageal mucosal changes suspicious for                            short-segment Barrett's esophagus. Biopsied.                           - Z-line irregular, 40 cm from the incisors.                           - 2 cm hiatal hernia.                           - Normal stomach.                           - Normal duodenal bulb, second portion of the                            duodenum and  third portion of the duodenum.comment: Moderate Sedation:      Per Anesthesia Care Recommendation:           - Return patient to hospital ward for ongoing care.                           - Clear liquid diet today.                           - Continue present medications.                           - Await pathology results.                           - Perform a colonoscopy tomorrow. Procedure Code(s):        --- Professional ---                           [id number], Esophagogastroduodenoscopy, flexible,                            transoral; with biopsy, single or multiple Diagnosis Code(s):        --- Professional ---                           K22.8, Other specified diseases of esophagus                           K44.9, Diaphragmatic hernia without obstruction or  gangrene                           D50.0, Iron deficiency anemia secondary to blood                            loss (chronic) CPT copyright 2019 American Medical Association. All rights reserved. The codes documented in this report are preliminary and upon coder review may  be revised to meet current compliance requirements. Hildred Laser, MD Hildred Laser, MD 03/05/2020 11:22:13 AM This report has been signed electronically. Number of Addenda: 0

## 2020-03-05 NOTE — Anesthesia Procedure Notes (Signed)
Date/Time: 03/05/2020 11:06 AM Performed by: Orlie Dakin, CRNA Pre-anesthesia Checklist: Patient identified, Emergency Drugs available, Suction available and Patient being monitored Patient Re-evaluated:Patient Re-evaluated prior to induction Oxygen Delivery Method: Nasal cannula Induction Type: IV induction Placement Confirmation: positive ETCO2

## 2020-03-05 NOTE — Plan of Care (Signed)
Notified MD Sharlet Salina on call, patient having elevated heart rate and rhythm changes. Patient had some SVT and some Afib rhythms. Patient was vomiting and having frequent bowel movements from drinking bowel prep. Patient currently sinus tach at 111. Medicated him with Zofran, and his night time medications. Will continue to monitor.

## 2020-03-05 NOTE — Transfer of Care (Signed)
Immediate Anesthesia Transfer of Care Note  Patient: Paul Lam  Procedure(s) Performed: ESOPHAGOGASTRODUODENOSCOPY (EGD) WITH PROPOFOL (N/A ) BIOPSY  Patient Location: PACU  Anesthesia Type:General  Level of Consciousness: awake and patient cooperative  Airway & Oxygen Therapy: Patient Spontanous Breathing  Post-op Assessment: Report given to RN and Post -op Vital signs reviewed and stable  Post vital signs: Reviewed and stable  Last Vitals:  Vitals Value Taken Time  BP 105/66 03/05/20 1115  Temp 36.4 C 03/05/20 1115  Pulse 55 03/05/20 1119  Resp 21 03/05/20 1119  SpO2 99 % 03/05/20 1119  Vitals shown include unvalidated device data.  Last Pain:  Vitals:   03/05/20 1059  TempSrc:   PainSc: 0-No pain         Complications: No complications documented.

## 2020-03-05 NOTE — Anesthesia Preprocedure Evaluation (Addendum)
Anesthesia Evaluation  Patient identified by MRN, date of birth, ID band Patient awake    Reviewed: Allergy & Precautions, NPO status , Patient's Chart, lab work & pertinent test results  History of Anesthesia Complications (+) PONV and history of anesthetic complications  Airway Mallampati: III  TM Distance: <3 FB     Dental  (+) Dental Advisory Given, Missing   Pulmonary shortness of breath, sleep apnea and Continuous Positive Airway Pressure Ventilation ,    Pulmonary exam normal breath sounds clear to auscultation       Cardiovascular Exercise Tolerance: Good hypertension, + angina + CAD and +CHF  + dysrhythmias Atrial Fibrillation  Rhythm:Irregular Rate:Normal  04-Mar-2020 02:04:57 [facility] ROUTINE RECORD Atrial fibrillation Borderline right axis deviation   Neuro/Psych  Headaches, PSYCHIATRIC DISORDERS Anxiety Depression    GI/Hepatic negative GI ROS, Neg liver ROS,   Endo/Other  negative endocrine ROS  Renal/GU negative Renal ROS     Musculoskeletal  (+) Arthritis ,   Abdominal   Peds  Hematology  (+) anemia ,   Anesthesia Other Findings   Reproductive/Obstetrics negative OB ROS                           Anesthesia Physical Anesthesia Plan  ASA: III  Anesthesia Plan: General   Post-op Pain Management:    Induction:   PONV Risk Score and Plan:   Airway Management Planned: Nasal Cannula, Natural Airway and Simple Face Mask  Additional Equipment:   Intra-op Plan:   Post-operative Plan:   Informed Consent: I have reviewed the patients History and Physical, chart, labs and discussed the procedure including the risks, benefits and alternatives for the proposed anesthesia with the patient or authorized representative who has indicated his/her understanding and acceptance.     Dental advisory given  Plan Discussed with: CRNA and  Surgeon  Anesthesia Plan Comments:         Anesthesia Quick Evaluation

## 2020-03-06 ENCOUNTER — Encounter (HOSPITAL_COMMUNITY): Payer: Self-pay | Admitting: Internal Medicine

## 2020-03-06 ENCOUNTER — Inpatient Hospital Stay (HOSPITAL_COMMUNITY): Payer: Medicare Other | Admitting: Anesthesiology

## 2020-03-06 ENCOUNTER — Encounter (HOSPITAL_COMMUNITY)
Admission: EM | Disposition: A | Discharge status: Home / Self Care | Payer: Self-pay | Source: Home / Self Care | Attending: Family Medicine

## 2020-03-06 ENCOUNTER — Inpatient Hospital Stay (HOSPITAL_COMMUNITY): Payer: Medicare Other

## 2020-03-06 DIAGNOSIS — D649 Anemia, unspecified: Secondary | ICD-10-CM | POA: Diagnosis not present

## 2020-03-06 DIAGNOSIS — I482 Chronic atrial fibrillation, unspecified: Secondary | ICD-10-CM | POA: Diagnosis not present

## 2020-03-06 DIAGNOSIS — K2289 Other specified disease of esophagus: Secondary | ICD-10-CM

## 2020-03-06 DIAGNOSIS — Z7901 Long term (current) use of anticoagulants: Secondary | ICD-10-CM

## 2020-03-06 DIAGNOSIS — I5031 Acute diastolic (congestive) heart failure: Secondary | ICD-10-CM | POA: Diagnosis not present

## 2020-03-06 DIAGNOSIS — D500 Iron deficiency anemia secondary to blood loss (chronic): Secondary | ICD-10-CM

## 2020-03-06 HISTORY — PX: POLYPECTOMY: SHX5525

## 2020-03-06 HISTORY — PX: COLONOSCOPY WITH PROPOFOL: SHX5780

## 2020-03-06 LAB — CBC
HCT: 29.5 % — ABNORMAL LOW (ref 39.0–52.0)
Hemoglobin: 8.8 g/dL — ABNORMAL LOW (ref 13.0–17.0)
MCH: 25 pg — ABNORMAL LOW (ref 26.0–34.0)
MCHC: 29.8 g/dL — ABNORMAL LOW (ref 30.0–36.0)
MCV: 83.8 fL (ref 80.0–100.0)
Platelets: 138 10*3/uL — ABNORMAL LOW (ref 150–400)
RBC: 3.52 MIL/uL — ABNORMAL LOW (ref 4.22–5.81)
RDW: 14.9 % (ref 11.5–15.5)
WBC: 6.6 10*3/uL (ref 4.0–10.5)
nRBC: 0 % (ref 0.0–0.2)

## 2020-03-06 LAB — COMPREHENSIVE METABOLIC PANEL
ALT: 9 U/L (ref 0–44)
AST: 15 U/L (ref 15–41)
Albumin: 3.3 g/dL — ABNORMAL LOW (ref 3.5–5.0)
Alkaline Phosphatase: 84 U/L (ref 38–126)
Anion gap: 7 (ref 5–15)
BUN: 20 mg/dL (ref 8–23)
CO2: 29 mmol/L (ref 22–32)
Calcium: 8.6 mg/dL — ABNORMAL LOW (ref 8.9–10.3)
Chloride: 103 mmol/L (ref 98–111)
Creatinine, Ser: 1.06 mg/dL (ref 0.61–1.24)
GFR calc Af Amer: >60 mL/min (ref >60)
GFR calc non Af Amer: >60 mL/min (ref >60)
Glucose, Bld: 110 mg/dL — ABNORMAL HIGH (ref 70–99)
Potassium: 4.6 mmol/L (ref 3.5–5.1)
Sodium: 139 mmol/L (ref 135–145)
Total Bilirubin: 0.8 mg/dL (ref 0.3–1.2)
Total Protein: 6 g/dL — ABNORMAL LOW (ref 6.5–8.1)

## 2020-03-06 SURGERY — COLONOSCOPY WITH PROPOFOL
Anesthesia: General

## 2020-03-06 MED ORDER — PROPOFOL 10 MG/ML IV BOLUS
INTRAVENOUS | Status: DC | PRN
  Administered 2020-03-06: 60 mg via INTRAVENOUS
  Administered 2020-03-06: 20 mg via INTRAVENOUS
  Administered 2020-03-06 (×2): 40 mg via INTRAVENOUS
  Administered 2020-03-06: 20 mg via INTRAVENOUS

## 2020-03-06 MED ORDER — LACTATED RINGERS IV SOLN: INTRAVENOUS | Status: DC | PRN

## 2020-03-06 MED ORDER — LIDOCAINE HCL (CARDIAC) PF 50 MG/5ML IV SOSY
PREFILLED_SYRINGE | INTRAVENOUS | Status: DC | PRN
  Administered 2020-03-06: 60 mg via INTRAVENOUS

## 2020-03-06 MED ORDER — STERILE WATER FOR IRRIGATION IR SOLN
Status: DC | PRN
  Administered 2020-03-06: 100 mL

## 2020-03-06 MED ORDER — PROPOFOL 500 MG/50ML IV EMUL
INTRAVENOUS | Status: DC | PRN
  Administered 2020-03-06: 50 ug/kg/min via INTRAVENOUS
  Administered 2020-03-06: 75 ug/kg/min via INTRAVENOUS

## 2020-03-06 NOTE — Progress Notes (Signed)
Paul Lam chat Dr. Grayland Jack would like to know if he can have a regular diet. Patient wants a BLT sandwich.   Updated patient and wife MD has to put in regular diet orders.   1727- walked into patient's room and patient was eating a BLT sandwich. Tolerated well. No co of N&V. MD aware

## 2020-03-06 NOTE — Transfer of Care (Signed)
Immediate Anesthesia Transfer of Care Note  Patient: Paul Lam  Procedure(s) Performed: COLONOSCOPY WITH PROPOFOL (N/A ) POLYPECTOMY  Patient Location: PACU  Anesthesia Type:General  Level of Consciousness: awake and patient cooperative  Airway & Oxygen Therapy: Patient Spontanous Breathing  Post-op Assessment: Report given to RN and Post -op Vital signs reviewed and stable  Post vital signs: Reviewed and stable  Last Vitals:  Vitals Value Taken Time  BP 138/73   Temp 98.5   Pulse 50 03/06/20 1554  Resp 22 03/06/20 1554  SpO2 94 % 03/06/20 1554  Vitals shown include unvalidated device data.  Last Pain:  Vitals:   03/06/20 1546  TempSrc:   PainSc: 0-No pain         Complications: No complications documented.

## 2020-03-06 NOTE — Progress Notes (Signed)
Paul Lam, M.D. Gastroenterology & Hepatology   Interval History: No acute events overnight. Patient reports feeling well.  He drank his bowel prep yesterday. Patient underwent EGD yesterday which showed presence of possible Barrett's esophagus but less than 1 cm in length, which was biopsied.  Rest of his exam was within normal limits. Patient denies having any melena, hematochezia, abdominal pain, nausea or vomiting. Hemoglobin today in the morning was 8.8, stable compared to yesterday.  Inpatient Medications:  Current Facility-Administered Medications:  .  [MAR Hold] 0.9 %  sodium chloride infusion, 250 mL, Intravenous, PRN, Waldemar Dickens, MD .  Doug Sou Hold] acetaminophen (TYLENOL) tablet 650 mg, 650 mg, Oral, Q6H PRN, 650 mg at 03/06/20 1057 **OR** [MAR Hold] acetaminophen (TYLENOL) suppository 650 mg, 650 mg, Rectal, Q6H PRN, Waldemar Dickens, MD .  Doug Sou Hold] ALPRAZolam Duanne Moron) tablet 0.5 mg, 0.5 mg, Oral, QHS, Waldemar Dickens, MD, 0.5 mg at 03/05/20 2036 .  [MAR Hold] brimonidine (ALPHAGAN) 0.15 % ophthalmic solution 1 drop, 1 drop, Left Eye, BID, Waldemar Dickens, MD, 1 drop at 03/06/20 1030 .  [MAR Hold] finasteride (PROSCAR) tablet 5 mg, 5 mg, Oral, Q supper, Waldemar Dickens, MD, 5 mg at 03/05/20 1704 .  [MAR Hold] furosemide (LASIX) injection 20 mg, 20 mg, Intravenous, Q12H, Darrick Meigs, Marge Duncans, MD, 20 mg at 03/05/20 2316 .  [MAR Hold] ondansetron (ZOFRAN) tablet 4 mg, 4 mg, Oral, Q6H PRN **OR** [MAR Hold] ondansetron (ZOFRAN) injection 4 mg, 4 mg, Intravenous, Q6H PRN, Waldemar Dickens, MD, 4 mg at 03/05/20 2036 .  [MAR Hold] potassium chloride SA (KLOR-CON) CR tablet 40 mEq, 40 mEq, Oral, Daily, Waldemar Dickens, MD, 40 mEq at 03/06/20 1031 .  [MAR Hold] simvastatin (ZOCOR) tablet 40 mg, 40 mg, Oral, QHS, Waldemar Dickens, MD, 40 mg at 03/05/20 2036 .  [MAR Hold] sodium chloride flush (NS) 0.9 % injection 3 mL, 3 mL, Intravenous, Q12H, Waldemar Dickens, MD, 3 mL at 03/06/20  1031 .  [MAR Hold] sodium chloride flush (NS) 0.9 % injection 3 mL, 3 mL, Intravenous, PRN, Waldemar Dickens, MD .  Doug Sou Hold] tamsulosin [facility]) capsule 0.4 mg, 0.4 mg, Oral, QPC supper, Darrick Meigs, Gagan S, MD, 0.4 mg at 03/05/20 1704   I/O    Intake/Output Summary (Last 24 hours) at 03/06/2020 1455 Last data filed at 03/06/2020 0900 Gross per 24 hour  Intake 240 ml  Output 400 ml  Net -160 ml     Physical Exam: Temp:  [98.4 F (36.9 C)-99 F (37.2 C)] 98.4 F (36.9 C) (10/01 1245) Pulse Rate:  [75-106] 75 (10/01 1245) Resp:  [16-20] 16 (10/01 1245) BP: (122-139)/(69-73) 139/69 (10/01 1245) SpO2:  [97 %-99 %] 97 % (10/01 1245)  Temp (24hrs), Avg:98.7 F (37.1 C), Min:98.4 F (36.9 C), Max:99 F (37.2 C) GENERAL: The patient is AO x3, in no acute distress. Obese. Elder. HEENT: Head is normocephalic and atraumatic. EOMI are intact. Mouth is well hydrated and without lesions. NECK: Supple. No masses LUNGS: Clear to auscultation. No presence of rhonchi/wheezing/rales. Adequate chest expansion HEART: RRR, normal s1 and s2. ABDOMEN: Soft, nontender, no guarding, no peritoneal signs, and nondistended. BS +. No masses. EXTREMITIES: Without any cyanosis, clubbing, rash, lesions or edema. NEUROLOGIC: AOx3, no focal motor deficit. SKIN: no jaundice, no rashes   Laboratory Data: CBC:     Component Value Date/Time   WBC 6.6 03/06/2020 0412   RBC 3.52 (L) 03/06/2020 0412   HGB 8.8 (L) 03/06/2020 3818  HCT 29.5 (L) 03/06/2020 0412   HCT 40.0 06/26/2018 0834   PLT 138 (L) 03/06/2020 0412   MCV 83.8 03/06/2020 0412   MCH 25.0 (L) 03/06/2020 0412   MCHC 29.8 (L) 03/06/2020 0412   RDW 14.9 03/06/2020 0412   LYMPHSABS 1.4 03/03/2020 1606   MONOABS 0.6 03/03/2020 1606   EOSABS 0.1 03/03/2020 1606   BASOSABS 0.0 03/03/2020 1606   COAG:  Lab Results  Component Value Date   INR 1.11 08/15/2011    BMP:  BMP Latest Ref Rng & Units 03/06/2020 03/05/2020 03/04/2020  Glucose 70 - 99 mg/dL  110(H) 100(H) 93  BUN 8 - 23 mg/dL 20 22 25(H)  Creatinine 0.61 - 1.24 mg/dL 1.06 1.04 1.04  Sodium 135 - 145 mmol/L 139 139 138  Potassium 3.5 - 5.1 mmol/L 4.6 4.3 4.2  Chloride 98 - 111 mmol/L 103 102 104  CO2 22 - 32 mmol/L 29 29 27   Calcium 8.9 - 10.3 mg/dL 8.6(L) 8.8(L) 8.5(L)    HEPATIC:  Hepatic Function Latest Ref Rng & Units 03/06/2020 03/03/2020 06/25/2018  Total Protein 6.5 - 8.1 g/dL 6.0(L) 6.3(L) 7.4  Albumin 3.5 - 5.0 g/dL 3.3(L) 3.5 4.2  AST 15 - 41 U/L 15 14(L) 18  ALT 0 - 44 U/L 9 10 8   Alk Phosphatase 38 - 126 U/L 84 79 75  Total Bilirubin 0.3 - 1.2 mg/dL 0.8 0.8 1.5(H)    CARDIAC:  Lab Results  Component Value Date   CKTOTAL 97 06/26/2018   TROPONINI <0.03 06/26/2018     Imaging: I personally reviewed and interpreted the available labs, imaging and endoscopic files.   Assessment/Plan: 79 year old male with past medical history of coronary artery disease, atrial fibrillation on Eliquis, anxiety, depression, hypertension, hyperlipidemia, OSA, who came to the hospital after being found to have anemia.  He was sent to the hospital by his cardiologist after he was found to have a hemoglobin of 7.9 with a normal MCV of 86.  Unfortunately, no iron studies were obtained.  He did not present any symptoms concerning for active gastrointestinal bleeding.  Patient underwent a transfusion of 1 unit of PRBC with adequate response.  He underwent an EGD yesterday that showed presence of subcentimeter salmon-colored tongue which was biopsied but no other alteration was found.  At this point, we will proceed with colonoscopy as he has not had it in several years and he had colon polyps in the past.  If negative, we will follow him in the GI clinic to recheck iron stores and potentially perform a capsule endoscopy as outpatient.  # Normocytic anemia -Proceed with colonoscopy today -Hold Eliquis for now -If negative colonoscopy, patient will need to be followed in the GI clinic for  potential capsule endoscopy.  Paul Peppers, MD Gastroenterology and Hepatology [facility]  Note: Occasional unusual wording and randomly placed punctuation marks may result from the use of speech recognition technology to transcribe this document

## 2020-03-06 NOTE — Anesthesia Procedure Notes (Signed)
Date/Time: 03/06/2020 3:05 PM Performed by: Vista Deck, CRNA Pre-anesthesia Checklist: Patient identified, Emergency Drugs available, Suction available, Timeout performed and Patient being monitored Patient Re-evaluated:Patient Re-evaluated prior to induction Oxygen Delivery Method: Nasal Cannula

## 2020-03-06 NOTE — Plan of Care (Signed)

## 2020-03-06 NOTE — Care Management Important Message (Signed)
Important Message  Patient Details  Name: Paul Lam MRN: [id number] Date of Birth: [birth year]   Medicare Important Message Given:  Yes     Tommy Medal 03/06/2020, 2:30 PM

## 2020-03-06 NOTE — Op Note (Signed)
[facility] Patient Name: Paul Lam Procedure Date: 03/06/2020 2:56 PM MRN: [id number] Date of Birth: [birth year] Attending MD: Maylon Peppers ,  CSN: [id number] Age: 79 Admit Type: Outpatient Procedure:                Colonoscopy Indications:              Anemia Providers:                Maylon Peppers, [place] Theda Sers RN, RN, Crystal                            Page, Casimer Bilis, Technician, Raphael Gibney Tech., Technician Referring MD:              Medicines:                Monitored Anesthesia Care Complications:            No immediate complications. Estimated Blood Loss:     Estimated blood loss: none. Procedure:                Pre-Anesthesia Assessment:                           - Prior to the procedure, a History and Physical                            was performed, and patient medications, allergies                            and sensitivities were reviewed. The patient's                            tolerance of previous anesthesia was reviewed.                           - The risks and benefits of the procedure and the                            sedation options and risks were discussed with the                            patient. All questions were answered and informed                            consent was obtained.                           - ASA Grade Assessment: III - A patient with severe                            systemic disease.                           After obtaining informed consent, the colonoscope  was passed under direct vision. Throughout the                            procedure, the patient's blood pressure, pulse, and                            oxygen saturations were monitored continuously. The                            [id number]-[id number] ([id number]) scope was introduced through                            the anus and advanced to the the cecum, identified                            by  appendiceal orifice and ileocecal valve. The                            patient tolerated the procedure well. The                            colonoscopy was technically difficult and complex                            due to a redundant colon in the sigmoid colon.                            Successful completion of the procedure was aided by                            withdrawing and reinserting the scope. The quality                            of the bowel preparation was adequate after                            lavaging his colon thoroughly. Scope withdrawal                            time was 16 minutes. Scope In: 3:13:28 PM Scope Out: 3:48:07 PM Scope Withdrawal Time: 0 hours 19 minutes 58 seconds  Total Procedure Duration: 0 hours 34 minutes 39 seconds  Findings:      The perianal and digital rectal examinations were normal.      A 2 mm polyp was found in the descending colon. The polyp was sessile.       The polyp was removed with a cold biopsy forceps. Resection and       retrieval were complete.      Multiple small and large-mouthed diverticula were found in the sigmoid       colon and descending colon. There was no evidence of diverticular       bleeding.      Non-bleeding internal hemorrhoids were found during retroflexion. The       hemorrhoids were small. Impression:               -  One 2 mm polyp in the descending colon, removed                            with a cold biopsy forceps. Resected and retrieved.                           - Diverticulosis in the sigmoid colon and in the                            descending colon. There was no evidence of                            diverticular bleeding.                           - Non-bleeding internal hemorrhoids. Moderate Sedation:      Per Anesthesia Care Recommendation:           - Discharge patient to home (ambulatory).                           - Resume previous diet.                           - Await pathology  results.                           - repeat colonoscopy is not recommended due to age                            (79 years or older) for screening purposes.                           - Will check his iron stores in 3 months                           - Will discuss possible capsule endoscopy once seen                            in clinic. Procedure Code(s):        --- Professional ---                           [phone], GC, Colonoscopy, flexible; with biopsy,                            single or multiple Diagnosis Code(s):        --- Professional ---                           K63.5, Polyp of colon                           K64.8, Other hemorrhoids                           K57.30, Diverticulosis  of large intestine without                            perforation or abscess without bleeding CPT copyright 2019 American Medical Association. All rights reserved. The codes documented in this report are preliminary and upon coder review may  be revised to meet current compliance requirements. Maylon Peppers, MD Maylon Peppers,  03/06/2020 4:11:06 PM This report has been signed electronically. Number of Addenda: 0

## 2020-03-06 NOTE — Progress Notes (Signed)
[facility]  PROGRESS NOTE  Paul Lam TOI:[id number] DOB: [birth year] DOA: 03/03/2020 PCP: [name], MD   Brief HPI:   79 year old male with history of nonobstructive CAD, A. fib, nephrolithiasis, hyperlipidemia, hypertension, OSA was sent from cardiology office after his labs showed hemoglobin 7.9.  Patient complained of worsening bouts of dizziness and near syncope.  He was seen by PCP 5 days ago and Lasix was increased from 20 mg to 40 mg p.o. twice daily due to fluid overload/lower extremity edema.  Patient blood pressure was soft 83J to 82N systolic.  He denies black stool or dark tarry stool. In the ED stool guaiac was positive, patient started on Protonix and  given 1 unit PRBC.   Subjective   Patient seen and examined, complains of an episode of shaking, tremors and confusion which lasted for few minutes.  Patient says that this happened after he drank Nulytely for colonoscopy today.   Assessment/Plan:     1. Symptomatic anemia-patient baseline hemoglobin was 12.2.  He presented with hemoglobin of 7.9.  Also had near syncope though he was also hypotensive from Lasix.  Home Eliquis is currently on hold.  Also hold Relafen.  Hemoccult was positive in the ED. s/p 1 unit PRBC transfusion.  This morning hemoglobin is 8.8.   2. Episode of tremors/confusion-unclear etiology, patient says happened after he took Nulytely.  Will obtain CT head without contrast.  He has no focal deficit at this time. 3. GI bleed-EGD showed no significant source of bleeding.  GI following.  Patient to undergo colonoscopy today. 4. Acute diastolic CHF-EF 05%.  He did not respond to increased dose of po Lasix at home.  Started on IV Lasix 20 mg every 12 hours.  Blood pressure is stable.  Follow BMP in am. 5. Atrial fibrillation-continue metoprolol, Eliquis is on hold due to GI bleed. 6. Hypertension-continue to hold lisinopril. 7. Hyperlipidemia-continue statin. 8. BPH-continue Proscar, Flomax at  bedtime. 9. Anxiety-continue Xanax.     Lab Results  Component Value Date   SARSCOV2NAA NEGATIVE 03/03/2020     Scheduled medications:    ALPRAZolam  0.5 mg Oral QHS   brimonidine  1 drop Left Eye BID   finasteride  5 mg Oral Q supper   furosemide  20 mg Intravenous Q12H   potassium chloride  40 mEq Oral Daily   simvastatin  40 mg Oral QHS   sodium chloride flush  3 mL Intravenous Q12H   tamsulosin  0.4 mg Oral QPC supper       SpO2: 99 %    CBC: Recent Labs  Lab 03/03/20 1248 03/03/20 1606 03/04/20 0323 03/05/20 0625 03/06/20 0412  WBC 5.3 5.2 5.0 4.8 6.6  NEUTROABS  --  3.1  --   --   --   HGB 7.9* 7.9* 8.7* 9.0* 8.8*  HCT 27.4* 27.6* 30.0* 29.9* 29.5*  MCV 86.2 86.0 86.0 85.2 83.8  PLT 148* 152 120* 131* 138*    Basic Metabolic Panel: Recent Labs  Lab 03/03/20 1248 03/03/20 1606 03/04/20 0323 03/05/20 0625 03/06/20 0412  NA 138 137 138 139 139  K 4.3 4.0 4.2 4.3 4.6  CL 102 101 104 102 103  CO2 28 27 27 29 29   GLUCOSE 105* 114* 93 100* 110*  BUN 28* 28* 25* 22 20  CREATININE 1.16 1.16 1.04 1.04 1.06  CALCIUM 8.7* 8.5* 8.5* 8.8* 8.6*     Liver Function Tests: Recent Labs  Lab 03/03/20 1606 03/06/20 0412  AST 14*  15  ALT 10 9  ALKPHOS 79 84  BILITOT 0.8 0.8  PROT 6.3* 6.0*  ALBUMIN 3.5 3.3*     Antibiotics: Anti-infectives (From admission, onward)   None       DVT prophylaxis: SCDs  Code Status: Full code  Family Communication: No family at bedside    Status is: Inpatient  Dispo: The patient is from: Home              Anticipated d/c is to: Home              Anticipated d/c date is: 03/07/20              Patient currently not medically stable for discharge  Barrier to discharge-pending colonoscopy       Consultants:  None  Procedures:  None   Objective   Vitals:   03/05/20 1145 03/05/20 1300 03/05/20 1355 03/05/20 2149  BP: 126/66 134/76 (!) 124/53 122/73  Pulse: 60 68 64 (!) 106  Resp: 14  16 15 20   Temp:  98.6 F (37 C) 98.3 F (36.8 C) 99 F (37.2 C)  TempSrc:  Oral  Oral  SpO2: 100% 98% 100% 99%  Weight:      Height:        Intake/Output Summary (Last 24 hours) at 03/06/2020 1227 Last data filed at 03/06/2020 0900 Gross per 24 hour  Intake 240 ml  Output 400 ml  Net -160 ml    09/29 1901 - 10/01 0700 In: 400 [I.V.:400] Out: 400 [Urine:400]  Filed Weights   03/03/20 1527 03/05/20 0500  Weight: 102.7 kg 99.1 kg    Physical Examination:   General-appears in no acute distress Heart-S1-S2, regular, no murmur auscultated Lungs-clear to auscultation bilaterally, no wheezing or crackles auscultated Abdomen-soft, nontender, no organomegaly Extremities-no edema in the lower extremities Neuro-alert, oriented x3, no focal deficit noted   Data Reviewed:   Recent Results (from the past 240 hour(s))  Respiratory Panel by RT PCR (Flu A&B, Covid) - Nasopharyngeal Swab     Status: None   Collection Time: 03/03/20  6:30 PM   Specimen: Nasopharyngeal Swab  Result Value Ref Range Status   SARS Coronavirus 2 by RT PCR NEGATIVE NEGATIVE Final    Comment: (NOTE) SARS-CoV-2 target nucleic acids are NOT DETECTED.  The SARS-CoV-2 RNA is generally detectable in upper respiratoy specimens during the acute phase of infection. The lowest concentration of SARS-CoV-2 viral copies this assay can detect is 131 copies/mL. A negative result does not preclude SARS-Cov-2 infection and should not be used as the sole basis for treatment or other patient management decisions. A negative result may occur with  improper specimen collection/handling, submission of specimen other than nasopharyngeal swab, presence of viral mutation(s) within the areas targeted by this assay, and inadequate number of viral copies (<131 copies/mL). A negative result must be combined with clinical observations, patient history, and epidemiological information. The expected result is Negative.  Fact  Sheet for Patients:  PinkCheek.be  Fact Sheet for Healthcare Providers:  GravelBags.it  This test is no t yet approved or cleared by the [place] FDA and  has been authorized for detection and/or diagnosis of SARS-CoV-2 by FDA under an Emergency Use Authorization (EUA). This EUA will remain  in effect (meaning this test can be used) for the duration of the COVID-19 declaration under Section 564(b)(1) of the Act, 21 U.S.C. section 360bbb-3(b)(1), unless the authorization is terminated or revoked sooner.     Influenza A by  PCR NEGATIVE NEGATIVE Final   Influenza B by PCR NEGATIVE NEGATIVE Final    Comment: (NOTE) The Xpert Xpress SARS-CoV-2/FLU/RSV assay is intended as an aid in  the diagnosis of influenza from Nasopharyngeal swab specimens and  should not be used as a sole basis for treatment. Nasal washings and  aspirates are unacceptable for Xpert Xpress SARS-CoV-2/FLU/RSV  testing.  Fact Sheet for Patients: PinkCheek.be  Fact Sheet for Healthcare Providers: GravelBags.it  This test is not yet approved or cleared by the [place] FDA and  has been authorized for detection and/or diagnosis of SARS-CoV-2 by  FDA under an Emergency Use Authorization (EUA). This EUA will remain  in effect (meaning this test can be used) for the duration of the  Covid-19 declaration under Section 564(b)(1) of the Act, 21  U.S.C. section 360bbb-3(b)(1), unless the authorization is  terminated or revoked. Performed at [facility], [address] [place], [place] [address]     BNP (last 3 results) Recent Labs    03/03/20 1606  BNP 117.0*     Studies:  ECHOCARDIOGRAM COMPLETE  Result Date: 03/04/2020    ECHOCARDIOGRAM REPORT   Patient Name:   Paul Lam Date of Exam: 03/04/2020 Medical Rec #:  [id number]      Height:       68.0 in Accession #:    [id number]      Weight:       226.5 lb Date of Birth:  [birth year]      BSA:          2.155 m Patient Age:    8 years       BP:           130/52 mmHg Patient Gender: M              HR:           53 bpm. Exam Location:  [facility] Procedure: 2D Echo, Cardiac Doppler and Color Doppler Indications:    Atrial Fibrillation 427.31 / I48.91  History:        Patient has prior history of Echocardiogram examinations, most                 recent 01/08/2018. CAD, Arrythmias:Atrial Fibrillation,                 Signs/Symptoms:Shortness of Breath; Risk Factors:Hypertension                 and Dyslipidemia.  Sonographer:    Bernadene Person RDCS Referring Phys: [place]  1. Left ventricular ejection fraction, by estimation, is 60 to 65%. The left ventricle has normal function. The left ventricle has no regional wall motion abnormalities. Left ventricular diastolic parameters are indeterminate.  2. Right ventricular systolic function is normal. The right ventricular size is normal. There is normal pulmonary artery systolic pressure. The estimated right ventricular systolic pressure is 95.1 mmHg.  3. Left atrial size was moderately dilated.  4. The mitral valve is abnormal, mildly thickened and calcified. Mild mitral valve regurgitation.  5. The aortic valve is tricuspid. There is moderate calcification of the aortic valve. Aortic valve regurgitation is mild. Mild to moderate aortic valve stenosis. Aortic regurgitation PHT measures 725 msec. Aortic valve mean gradient measures 11.3 mmHg.  Aortic valve Vmax measures 2.29 m/s.  6. The inferior vena cava is dilated in size with >50% respiratory variability, suggesting right atrial pressure of 8 mmHg. FINDINGS  Left Ventricle: Left ventricular ejection fraction,  by estimation, is 60 to 65%. The left ventricle has normal function. The left ventricle has no regional wall motion abnormalities. The left ventricular internal cavity size was normal in size. There is  borderline left  ventricular hypertrophy. Left ventricular diastolic parameters are indeterminate. Right Ventricle: The right ventricular size is normal. No increase in right ventricular wall thickness. Right ventricular systolic function is normal. There is normal pulmonary artery systolic pressure. The tricuspid regurgitant velocity is 2.45 m/s, and  with an assumed right atrial pressure of 8 mmHg, the estimated right ventricular systolic pressure is 92.4 mmHg. Left Atrium: Left atrial size was moderately dilated. Right Atrium: Right atrial size was normal in size. Pericardium: There is no evidence of pericardial effusion. Mitral Valve: The mitral valve is abnormal. There is mild thickening of the mitral valve leaflet(s). There is mild calcification of the mitral valve leaflet(s). Mild mitral annular calcification. Mild mitral valve regurgitation. Tricuspid Valve: The tricuspid valve is grossly normal. Tricuspid valve regurgitation is trivial. Aortic Valve: The aortic valve is tricuspid. There is moderate calcification of the aortic valve. There is mild aortic valve annular calcification. Aortic valve regurgitation is mild. Aortic regurgitation PHT measures 725 msec. Mild to moderate aortic stenosis is present. Aortic valve mean gradient measures 11.3 mmHg. Aortic valve peak gradient measures 21.0 mmHg. Aortic valve area, by VTI measures 1.16 cm. Pulmonic Valve: The pulmonic valve was grossly normal. Pulmonic valve regurgitation is trivial. Aorta: The aortic root is normal in size and structure. Venous: The inferior vena cava is dilated in size with greater than 50% respiratory variability, suggesting right atrial pressure of 8 mmHg. IAS/Shunts: No atrial level shunt detected by color flow Doppler.  LEFT VENTRICLE PLAX 2D LVIDd:         5.23 cm  Diastology LVIDs:         3.06 cm  LV e' medial:    8.70 cm/s LV PW:         1.04 cm  LV E/e' medial:  16.8 LV IVS:        1.06 cm  LV e' lateral:   7.27 cm/s LVOT diam:     2.10 cm  LV  E/e' lateral: 20.1 LV SV:         63 LV SV Index:   29 LVOT Area:     3.46 cm  RIGHT VENTRICLE RV S prime:     9.82 cm/s TAPSE (M-mode): 1.9 cm LEFT ATRIUM             Index       RIGHT ATRIUM           Index LA diam:        4.60 cm 2.13 cm/m  RA Area:     21.40 cm LA Vol (A2C):   79.2 ml 36.76 ml/m RA Volume:   58.20 ml  27.01 ml/m LA Vol (A4C):   94.6 ml 43.90 ml/m LA Biplane Vol: 87.2 ml 40.47 ml/m  AORTIC VALVE AV Area (Vmax):    1.46 cm AV Area (Vmean):   1.27 cm AV Area (VTI):     1.16 cm AV Vmax:           229.00 cm/s AV Vmean:          159.000 cm/s AV VTI:            0.545 m AV Peak Grad:      21.0 mmHg AV Mean Grad:      11.3 mmHg LVOT Vmax:  96.80 cm/s LVOT Vmean:        58.300 cm/s LVOT VTI:          0.182 m LVOT/AV VTI ratio: 0.33 AI PHT:            725 msec  AORTA Ao Root diam: 3.50 cm Ao Asc diam:  2.40 cm MITRAL VALVE                TRICUSPID VALVE MV Area (PHT): 3.45 cm     TR Peak grad:   24.0 mmHg MV Decel Time: 220 msec     TR Vmax:        245.00 cm/s MV E velocity: 146.00 cm/s MV A velocity: 70.40 cm/s   SHUNTS MV E/A ratio:  2.07         Systemic VTI:  0.18 m                             Systemic Diam: 2.10 cm Rozann Lesches MD Electronically signed by Rozann Lesches MD Signature Date/Time: 03/04/2020/3:45:14 PM    Final        Oswald Hillock   [facility] If 7PM-7AM, please contact night-coverage at [email or web address], Office  [phone]   03/06/2020, 12:27 PM  LOS: 3 days

## 2020-03-06 NOTE — Brief Op Note (Addendum)
03/03/2020 - 03/06/2020  3:51 PM  PATIENT:  Paul Lam  79 y.o. male  PRE-OPERATIVE DIAGNOSIS:  GI bleed and anemia; no bleeding lesion noted on esophagogastroduodenoscopy  POST-OPERATIVE DIAGNOSIS:  diverticulosis, sigmoid colon polyp, hemorrhoids  PROCEDURE:  Procedure(s): COLONOSCOPY WITH PROPOFOL (N/A) POLYPECTOMY  SURGEON:  Surgeon(s) and Role:    * Harvel Quale, MD - Primary  Colonoscopy was performed today under MAC, patient tolerated well. Patient was found to have 1 polyp in the descending colon which was resected with cold forceps , he also had internal hemorrhoids, no other alterations were found.  There was presence of moderate amount of stool, worse on the left side of the colon.  This was thoroughly lavaged and visualization was possible.  Bowel preparation was adequate after lavage was performed.  No other alterations were found throughout his colon that could explain his anemia.  PLAN: -Advance his diet as tolerated -Patient okay to be discharged from the gastroenterology standpoint, will need to follow in the gastroenterology clinic in 2 to 3 weeks -We will check his iron stores in 3 months -We will discuss possible capsule endoscopy once he is seen in clinic. - GI service will sign-off, please call us back if you have any more questions.  Maylon Peppers, MD Gastroenterology and Hepatology [facility]

## 2020-03-06 NOTE — Anesthesia Postprocedure Evaluation (Signed)
Anesthesia Post Note  Patient: Paul Lam  Procedure(s) Performed: COLONOSCOPY WITH PROPOFOL (N/A ) POLYPECTOMY  Patient location during evaluation: PACU Anesthesia Type: General Level of consciousness: awake and alert and patient cooperative Pain management: satisfactory to patient Vital Signs Assessment: post-procedure vital signs reviewed and stable Respiratory status: spontaneous breathing Cardiovascular status: stable Postop Assessment: no apparent nausea or vomiting Anesthetic complications: no   No complications documented.   Last Vitals:  Vitals:   03/06/20 1245 03/06/20 1555  BP: 139/69 138/73  Pulse: 75 67  Resp: 16 13  Temp: 36.9 C (P) 36.9 C  SpO2: 97% 98%    Last Pain:  Vitals:   03/06/20 1546  TempSrc:   PainSc: 0-No pain                 [name]

## 2020-03-06 NOTE — Anesthesia Preprocedure Evaluation (Signed)
Anesthesia Evaluation  Patient identified by MRN, date of birth, ID band Patient awake    Reviewed: Allergy & Precautions, NPO status , Patient's Chart, lab work & pertinent test results  History of Anesthesia Complications (+) PONV and history of anesthetic complications  Airway Mallampati: III  TM Distance: <3 FB Neck ROM: Full    Dental  (+) Dental Advisory Given, Missing   Pulmonary shortness of breath, sleep apnea and Continuous Positive Airway Pressure Ventilation ,    Pulmonary exam normal breath sounds clear to auscultation       Cardiovascular Exercise Tolerance: Good hypertension, Pt. on medications + angina + CAD and +CHF  + dysrhythmias Atrial Fibrillation  Rhythm:Irregular Rate:Normal  04-Mar-2020 02:04:57 [facility] ROUTINE RECORD Atrial fibrillation Borderline right axis deviation   Neuro/Psych  Headaches, PSYCHIATRIC DISORDERS Anxiety Depression    GI/Hepatic negative GI ROS, Neg liver ROS,   Endo/Other  negative endocrine ROS  Renal/GU negative Renal ROS     Musculoskeletal  (+) Arthritis ,   Abdominal   Peds  Hematology  (+) anemia ,   Anesthesia Other Findings   Reproductive/Obstetrics negative OB ROS                             Anesthesia Physical  Anesthesia Plan  ASA: III  Anesthesia Plan: General   Post-op Pain Management:    Induction:   PONV Risk Score and Plan: TIVA  Airway Management Planned: Nasal Cannula, Natural Airway and Simple Face Mask  Additional Equipment:   Intra-op Plan:   Post-operative Plan:   Informed Consent: I have reviewed the patients History and Physical, chart, labs and discussed the procedure including the risks, benefits and alternatives for the proposed anesthesia with the patient or authorized representative who has indicated his/her understanding and acceptance.     Dental advisory given  Plan  Discussed with: CRNA and Surgeon  Anesthesia Plan Comments:         Anesthesia Quick Evaluation

## 2020-03-07 DIAGNOSIS — I5031 Acute diastolic (congestive) heart failure: Secondary | ICD-10-CM | POA: Diagnosis not present

## 2020-03-07 DIAGNOSIS — D649 Anemia, unspecified: Secondary | ICD-10-CM | POA: Diagnosis not present

## 2020-03-07 DIAGNOSIS — I10 Essential (primary) hypertension: Secondary | ICD-10-CM | POA: Diagnosis not present

## 2020-03-07 DIAGNOSIS — I482 Chronic atrial fibrillation, unspecified: Secondary | ICD-10-CM | POA: Diagnosis not present

## 2020-03-07 LAB — BPAM RBC
Blood Product Expiration Date: 202110292359
Blood Product Expiration Date: 202110292359
ISSUE DATE / TIME: 202109282009
Unit Type and Rh: 5100
Unit Type and Rh: 5100

## 2020-03-07 LAB — BASIC METABOLIC PANEL
Anion gap: 7 (ref 5–15)
BUN: 15 mg/dL (ref 8–23)
CO2: 29 mmol/L (ref 22–32)
Calcium: 8.8 mg/dL — ABNORMAL LOW (ref 8.9–10.3)
Chloride: 104 mmol/L (ref 98–111)
Creatinine, Ser: 0.99 mg/dL (ref 0.61–1.24)
GFR calc Af Amer: >60 mL/min (ref >60)
GFR calc non Af Amer: >60 mL/min (ref >60)
Glucose, Bld: 96 mg/dL (ref 70–99)
Potassium: 4.2 mmol/L (ref 3.5–5.1)
Sodium: 140 mmol/L (ref 135–145)

## 2020-03-07 LAB — TYPE AND SCREEN
ABO/RH(D): O POS
Antibody Screen: NEGATIVE
Unit division: 0
Unit division: 0

## 2020-03-07 NOTE — Plan of Care (Signed)

## 2020-03-07 NOTE — Discharge Summary (Signed)
Physician Discharge Summary  Paul Lam XIP:[id number] DOB: 01/27/1941 DOA: 03/03/2020  PCP: [name], MD  Admit date: 03/03/2020 Discharge date: 03/07/2020  Time spent: 50* minutes  Recommendations for Outpatient Follow-up:  1. Follow-up Dr. Laural Golden in 2 weeks   Discharge Diagnoses:  Active Problems:   Anxiety   Hypertension   Atrial fibrillation, chronic (HCC)   Symptomatic anemia   Hypotension   Acute diastolic CHF (congestive heart failure) ([place])   Discharge Condition: Stable  Diet recommendation: Heart healthy diet  Filed Weights   03/03/20 1527 03/05/20 0500 03/07/20 0441  Weight: 102.7 kg 99.1 kg 100.7 kg    History of present illness:   79 year old male with history of nonobstructive CAD, A. fib, nephrolithiasis, hyperlipidemia, hypertension, OSA was sent from cardiology office after his labs showed hemoglobin 7.9.  Patient complained of worsening bouts of dizziness and near syncope.  He was seen by PCP 5 days ago and Lasix was increased from 20 mg to 40 mg p.o. twice daily due to fluid overload/lower extremity edema.  Patient blood pressure was soft 67H to 41P systolic.  He denies black stool or dark tarry stool. In the ED stool guaiac was positive, patient started on Protonix and  given 1 unit PRBC.  Hospital Course:   1. Symptomatic anemia-patient baseline hemoglobin was 12.2.  He presented with hemoglobin of 7.9.  Also had near syncope though he was also hypotensive from Lasix.  Home Eliquis is currently on hold.  Also hold Relafen.  Hemoccult was positive in the ED. s/p 1 unit PRBC transfusion.  This morning hemoglobin is 8.8.   2. Episode of tremors/confusion-unclear etiology, patient says happened after he took Nulytely.  CT head without contrast is unremarkable.  Symptoms have resolved.  3. GI bleed-EGD showed no significant source of bleeding.  One 2 mm polyp in the descending colon, removed with a cold biopsy forceps. Resected and retrieved. -  Diverticulosis in the sigmoid colon and in the descending colon. There was no evidence of diverticular bleeding. - Non-bleeding internal hemorrhoids.  4. Acute diastolic CHF-EF 37%.  He did not respond to increased dose of po Lasix at home.  Started on IV Lasix 20 mg every 12 hours.  Blood pressure is stable.  Patient to continue taking Lasix 40 mg p.o. daily at home. 5. Atrial fibrillation-continue metoprolol, continue Eliquis. 6. Hypertension-continue  lisinopril. 7. Hyperlipidemia-continue statin. 8. BPH-continue Proscar, Flomax at bedtime. 9. Anxiety-continue Xanax.   Procedures:    Consultations:  Gastroenterology  Discharge Exam: Vitals:   03/07/20 0441 03/07/20 0918  BP: 110/62 (!) 156/83  Pulse: 69 90  Resp: 18 18  Temp: 97.8 F (36.6 C)   SpO2: 100% 100%    General: Appears in no acute distress Cardiovascular: S1-S2, regular Respiratory: Clear to auscultation bilaterally  Discharge Instructions   Discharge Instructions    Diet - low sodium heart healthy   Complete by: As directed    Increase activity slowly   Complete by: As directed      Allergies as of 03/07/2020   No Known Allergies     Medication List    STOP taking these medications   nabumetone 750 MG tablet Commonly known as: RELAFEN     TAKE these medications   ALPRAZolam 0.5 MG tablet Commonly known as: XANAX Take 0.5 mg at bedtime by mouth. Anxiety   brimonidine 0.15 % ophthalmic solution Commonly known as: ALPHAGAN Place 1 drop into the left eye 2 (two) times daily.   docusate  sodium 100 MG capsule Commonly known as: COLACE Take 100 mg by mouth 2 (two) times daily.   Eliquis 5 MG Tabs tablet Generic drug: apixaban TAKE (1) TABLET TWICE DAILY.   FIBER-CAPS PO Take by mouth.   finasteride 5 MG tablet Commonly known as: PROSCAR Take 1 tablet (5 mg total) by mouth daily with supper.   furosemide 40 MG tablet Commonly known as: Lasix Take 1 tablet (40 mg total) by mouth  daily.   lisinopril 5 MG tablet Commonly known as: ZESTRIL Take 5 mg by mouth daily.   metoprolol tartrate 25 MG tablet Commonly known as: LOPRESSOR Take 1 tablet (25 mg total) by mouth 2 (two) times daily.   NON FORMULARY at bedtime. VPAP   potassium chloride 10 MEQ tablet Commonly known as: KLOR-CON Take 10 mEq daily as needed by mouth (with the lasix for fluid retention.). Take with Lasix   simvastatin 40 MG tablet Commonly known as: ZOCOR Take 40 mg by mouth at bedtime.   tamsulosin 0.4 MG Caps capsule Commonly known as: FLOMAX Take 1 capsule (0.4 mg total) by mouth in the morning and at bedtime. Take 1 capsule (0.4 mg) by mouth daily with supper (~1830) & take 1 capsule (0.4 mg) by mouth at bedtime.   TART CHERRY ADVANCED PO Take 1 tablet by mouth daily.   vitamin C with rose hips 500 MG tablet Take 500 mg by mouth daily.   VITAMIN D3 PO Take by mouth.   Zinc 50 MG Tabs Take by mouth.      No Known Allergies    The results of significant diagnostics from this hospitalization (including imaging, microbiology, ancillary and laboratory) are listed below for reference.    Significant Diagnostic Studies: DG Tibia/Fibula Right  Result Date: 03/03/2020 CLINICAL DATA:  Post fall with anterior lower leg tenderness and bruising. EXAM: RIGHT TIBIA AND FIBULA - 2 VIEW COMPARISON:  None. FINDINGS: Total knee arthroplasty is intact. There is no evidence of fracture or other focal bone lesions. Ankle alignment is maintained. There is generalized soft tissue edema throughout the lower extremity. Plantar calcaneal spur and Achilles tendon enthesophyte. IMPRESSION: Soft tissue edema without acute fracture of the right lower leg. Electronically Signed   By: Keith Rake M.D.   On: 03/03/2020 21:19   CT HEAD WO CONTRAST  Result Date: 03/06/2020 CLINICAL DATA:  Dizziness, nonspecific. Additional provided: Dizziness onset yesterday, new onset of confusion this morning. EXAM: CT  HEAD WITHOUT CONTRAST TECHNIQUE: Contiguous axial images were obtained from the base of the skull through the vertex without intravenous contrast. COMPARISON:  Maxillofacial CT 04/26/2017. FINDINGS: Brain: Mild generalized cerebral atrophy with associated prominence of ventricles and sulci. Mild ill-defined hypoattenuation within the cerebral white matter is nonspecific, but consistent with chronic small vessel ischemic disease. There is no acute intracranial hemorrhage. No demarcated cortical infarct. No extra-axial fluid collection. No evidence of intracranial mass. No midline shift. Vascular: No hyperdense vessel.  Atherosclerotic calcifications. Skull: Normal. Negative for fracture or focal lesion. Sinuses/Orbits: Postprocedural changes to the globes. Postoperative appearance of the paranasal sinuses. Mild ethmoid and right sphenoid sinus mucosal thickening. No significant mastoid effusion. IMPRESSION: No evidence of acute intracranial abnormality. Mild generalized cerebral atrophy and chronic small vessel ischemic disease. Mild ethmoid and right sphenoid sinus mucosal thickening. Electronically Signed   By: Kellie Simmering DO   On: 03/06/2020 12:50   DG Chest Portable 1 View  Result Date: 03/03/2020 CLINICAL DATA:  Leg swelling.  History of CHF. EXAM: PORTABLE  CHEST 1 VIEW COMPARISON:  06/25/2018 FINDINGS: Midline trachea. Borderline cardiomegaly. Atherosclerosis in the transverse aorta. No pleural effusion or pneumothorax. No congestive failure. IMPRESSION: Borderline cardiomegaly, without acute disease. Aortic Atherosclerosis (ICD10-I70.0). Electronically Signed   By: Abigail Miyamoto M.D.   On: 03/03/2020 19:33   ECHOCARDIOGRAM COMPLETE  Result Date: 03/04/2020    ECHOCARDIOGRAM REPORT   Patient Name:   CLETO CLAGGETT Date of Exam: 03/04/2020 Medical Rec #:  [id number]      Height:       68.0 in Accession #:    [id number]     Weight:       226.5 lb Date of Birth:  1940/12/10      BSA:          2.155 m Patient  Age:    28 years       BP:           130/52 mmHg Patient Gender: M              HR:           53 bpm. Exam Location:  [facility] Procedure: 2D Echo, Cardiac Doppler and Color Doppler Indications:    Atrial Fibrillation 427.31 / I48.91  History:        Patient has prior history of Echocardiogram examinations, most                 recent 01/08/2018. CAD, Arrythmias:Atrial Fibrillation,                 Signs/Symptoms:Shortness of Breath; Risk Factors:Hypertension                 and Dyslipidemia.  Sonographer:    Bernadene Person RDCS Referring Phys: [place]  1. Left ventricular ejection fraction, by estimation, is 60 to 65%. The left ventricle has normal function. The left ventricle has no regional wall motion abnormalities. Left ventricular diastolic parameters are indeterminate.  2. Right ventricular systolic function is normal. The right ventricular size is normal. There is normal pulmonary artery systolic pressure. The estimated right ventricular systolic pressure is 94.8 mmHg.  3. Left atrial size was moderately dilated.  4. The mitral valve is abnormal, mildly thickened and calcified. Mild mitral valve regurgitation.  5. The aortic valve is tricuspid. There is moderate calcification of the aortic valve. Aortic valve regurgitation is mild. Mild to moderate aortic valve stenosis. Aortic regurgitation PHT measures 725 msec. Aortic valve mean gradient measures 11.3 mmHg.  Aortic valve Vmax measures 2.29 m/s.  6. The inferior vena cava is dilated in size with >50% respiratory variability, suggesting right atrial pressure of 8 mmHg. FINDINGS  Left Ventricle: Left ventricular ejection fraction, by estimation, is 60 to 65%. The left ventricle has normal function. The left ventricle has no regional wall motion abnormalities. The left ventricular internal cavity size was normal in size. There is  borderline left ventricular hypertrophy. Left ventricular diastolic parameters are indeterminate. Right  Ventricle: The right ventricular size is normal. No increase in right ventricular wall thickness. Right ventricular systolic function is normal. There is normal pulmonary artery systolic pressure. The tricuspid regurgitant velocity is 2.45 m/s, and  with an assumed right atrial pressure of 8 mmHg, the estimated right ventricular systolic pressure is 54.6 mmHg. Left Atrium: Left atrial size was moderately dilated. Right Atrium: Right atrial size was normal in size. Pericardium: There is no evidence of pericardial effusion. Mitral Valve: The mitral valve is abnormal. There is mild thickening  of the mitral valve leaflet(s). There is mild calcification of the mitral valve leaflet(s). Mild mitral annular calcification. Mild mitral valve regurgitation. Tricuspid Valve: The tricuspid valve is grossly normal. Tricuspid valve regurgitation is trivial. Aortic Valve: The aortic valve is tricuspid. There is moderate calcification of the aortic valve. There is mild aortic valve annular calcification. Aortic valve regurgitation is mild. Aortic regurgitation PHT measures 725 msec. Mild to moderate aortic stenosis is present. Aortic valve mean gradient measures 11.3 mmHg. Aortic valve peak gradient measures 21.0 mmHg. Aortic valve area, by VTI measures 1.16 cm. Pulmonic Valve: The pulmonic valve was grossly normal. Pulmonic valve regurgitation is trivial. Aorta: The aortic root is normal in size and structure. Venous: The inferior vena cava is dilated in size with greater than 50% respiratory variability, suggesting right atrial pressure of 8 mmHg. IAS/Shunts: No atrial level shunt detected by color flow Doppler.  LEFT VENTRICLE PLAX 2D LVIDd:         5.23 cm  Diastology LVIDs:         3.06 cm  LV e' medial:    8.70 cm/s LV PW:         1.04 cm  LV E/e' medial:  16.8 LV IVS:        1.06 cm  LV e' lateral:   7.27 cm/s LVOT diam:     2.10 cm  LV E/e' lateral: 20.1 LV SV:         63 LV SV Index:   29 LVOT Area:     3.46 cm  RIGHT  VENTRICLE RV S prime:     9.82 cm/s TAPSE (M-mode): 1.9 cm LEFT ATRIUM             Index       RIGHT ATRIUM           Index LA diam:        4.60 cm 2.13 cm/m  RA Area:     21.40 cm LA Vol (A2C):   79.2 ml 36.76 ml/m RA Volume:   58.20 ml  27.01 ml/m LA Vol (A4C):   94.6 ml 43.90 ml/m LA Biplane Vol: 87.2 ml 40.47 ml/m  AORTIC VALVE AV Area (Vmax):    1.46 cm AV Area (Vmean):   1.27 cm AV Area (VTI):     1.16 cm AV Vmax:           229.00 cm/s AV Vmean:          159.000 cm/s AV VTI:            0.545 m AV Peak Grad:      21.0 mmHg AV Mean Grad:      11.3 mmHg LVOT Vmax:         96.80 cm/s LVOT Vmean:        58.300 cm/s LVOT VTI:          0.182 m LVOT/AV VTI ratio: 0.33 AI PHT:            725 msec  AORTA Ao Root diam: 3.50 cm Ao Asc diam:  2.40 cm MITRAL VALVE                TRICUSPID VALVE MV Area (PHT): 3.45 cm     TR Peak grad:   24.0 mmHg MV Decel Time: 220 msec     TR Vmax:        245.00 cm/s MV E velocity: 146.00 cm/s MV A velocity: 70.40 cm/s   SHUNTS MV E/A ratio:  2.07         Systemic VTI:  0.18 m                             Systemic Diam: 2.10 cm Rozann Lesches MD Electronically signed by Rozann Lesches MD Signature Date/Time: 03/04/2020/3:45:14 PM    Final     Microbiology: Recent Results (from the past 240 hour(s))  Respiratory Panel by RT PCR (Flu A&B, Covid) - Nasopharyngeal Swab     Status: None   Collection Time: 03/03/20  6:30 PM   Specimen: Nasopharyngeal Swab  Result Value Ref Range Status   SARS Coronavirus 2 by RT PCR NEGATIVE NEGATIVE Final    Comment: (NOTE) SARS-CoV-2 target nucleic acids are NOT DETECTED.  The SARS-CoV-2 RNA is generally detectable in upper respiratoy specimens during the acute phase of infection. The lowest concentration of SARS-CoV-2 viral copies this assay can detect is 131 copies/mL. A negative result does not preclude SARS-Cov-2 infection and should not be used as the sole basis for treatment or other patient management decisions. A negative  result may occur with  improper specimen collection/handling, submission of specimen other than nasopharyngeal swab, presence of viral mutation(s) within the areas targeted by this assay, and inadequate number of viral copies (<131 copies/mL). A negative result must be combined with clinical observations, patient history, and epidemiological information. The expected result is Negative.  Fact Sheet for Patients:  PinkCheek.be  Fact Sheet for Healthcare Providers:  GravelBags.it  This test is no t yet approved or cleared by the [place] FDA and  has been authorized for detection and/or diagnosis of SARS-CoV-2 by FDA under an Emergency Use Authorization (EUA). This EUA will remain  in effect (meaning this test can be used) for the duration of the COVID-19 declaration under Section 564(b)(1) of the Act, 21 U.S.C. section 360bbb-3(b)(1), unless the authorization is terminated or revoked sooner.     Influenza A by PCR NEGATIVE NEGATIVE Final   Influenza B by PCR NEGATIVE NEGATIVE Final    Comment: (NOTE) The Xpert Xpress SARS-CoV-2/FLU/RSV assay is intended as an aid in  the diagnosis of influenza from Nasopharyngeal swab specimens and  should not be used as a sole basis for treatment. Nasal washings and  aspirates are unacceptable for Xpert Xpress SARS-CoV-2/FLU/RSV  testing.  Fact Sheet for Patients: PinkCheek.be  Fact Sheet for Healthcare Providers: GravelBags.it  This test is not yet approved or cleared by the [place] FDA and  has been authorized for detection and/or diagnosis of SARS-CoV-2 by  FDA under an Emergency Use Authorization (EUA). This EUA will remain  in effect (meaning this test can be used) for the duration of the  Covid-19 declaration under Section 564(b)(1) of the Act, 21  U.S.C. section 360bbb-3(b)(1), unless the authorization is   terminated or revoked. Performed at [facility], [address] [place], [place] [address]      Labs: Basic Metabolic Panel: Recent Labs  Lab 03/03/20 1606 03/04/20 0323 03/05/20 0625 03/06/20 0412 03/07/20 0751  NA 137 138 139 139 140  K 4.0 4.2 4.3 4.6 4.2  CL 101 104 102 103 104  CO2 27 27 29 29 29   GLUCOSE 114* 93 100* 110* 96  BUN 28* 25* 22 20 15   CREATININE 1.16 1.04 1.04 1.06 0.99  CALCIUM 8.5* 8.5* 8.8* 8.6* 8.8*   Liver Function Tests: Recent Labs  Lab 03/03/20 1606 03/06/20 0412  AST 14* 15  ALT  10 9  ALKPHOS 79 84  BILITOT 0.8 0.8  PROT 6.3* 6.0*  ALBUMIN 3.5 3.3*   No results for input(s): LIPASE, AMYLASE in the last 168 hours. No results for input(s): AMMONIA in the last 168 hours. CBC: Recent Labs  Lab 03/03/20 1248 03/03/20 1606 03/04/20 0323 03/05/20 0625 03/06/20 0412  WBC 5.3 5.2 5.0 4.8 6.6  NEUTROABS  --  3.1  --   --   --   HGB 7.9* 7.9* 8.7* 9.0* 8.8*  HCT 27.4* 27.6* 30.0* 29.9* 29.5*  MCV 86.2 86.0 86.0 85.2 83.8  PLT 148* 152 120* 131* 138*   BNP: BNP (last 3 results) Recent Labs    03/03/20 1606  BNP 117.0*     Signed:  Oswald Hillock MD.  [facility] 03/07/2020, 10:32 AM

## 2020-03-09 ENCOUNTER — Encounter (HOSPITAL_COMMUNITY): Payer: Self-pay | Admitting: Gastroenterology

## 2020-03-09 LAB — SURGICAL PATHOLOGY

## 2020-03-10 ENCOUNTER — Other Ambulatory Visit (HOSPITAL_COMMUNITY): Payer: Medicare Other

## 2020-03-10 LAB — SURGICAL PATHOLOGY

## 2020-03-11 DIAGNOSIS — E6609 Other obesity due to excess calories: Secondary | ICD-10-CM | POA: Diagnosis not present

## 2020-03-11 DIAGNOSIS — I4891 Unspecified atrial fibrillation: Secondary | ICD-10-CM | POA: Diagnosis not present

## 2020-03-11 DIAGNOSIS — L570 Actinic keratosis: Secondary | ICD-10-CM | POA: Diagnosis not present

## 2020-03-11 DIAGNOSIS — Z6834 Body mass index (BMI) 34.0-34.9, adult: Secondary | ICD-10-CM | POA: Diagnosis not present

## 2020-03-11 DIAGNOSIS — D649 Anemia, unspecified: Secondary | ICD-10-CM | POA: Diagnosis not present

## 2020-03-11 DIAGNOSIS — I5030 Unspecified diastolic (congestive) heart failure: Secondary | ICD-10-CM | POA: Diagnosis not present

## 2020-03-11 DIAGNOSIS — I2510 Atherosclerotic heart disease of native coronary artery without angina pectoris: Secondary | ICD-10-CM | POA: Diagnosis not present

## 2020-03-23 ENCOUNTER — Other Ambulatory Visit: Payer: Self-pay

## 2020-03-23 ENCOUNTER — Encounter: Payer: Self-pay | Admitting: Internal Medicine

## 2020-03-23 ENCOUNTER — Ambulatory Visit: Payer: Medicare Other | Admitting: Internal Medicine

## 2020-03-23 VITALS — BP 111/66 | HR 58 | Ht 68.0 in | Wt 221.6 lb

## 2020-03-23 DIAGNOSIS — Z7901 Long term (current) use of anticoagulants: Secondary | ICD-10-CM | POA: Diagnosis not present

## 2020-03-23 DIAGNOSIS — I5032 Chronic diastolic (congestive) heart failure: Secondary | ICD-10-CM | POA: Diagnosis not present

## 2020-03-23 DIAGNOSIS — I4819 Other persistent atrial fibrillation: Secondary | ICD-10-CM

## 2020-03-23 DIAGNOSIS — D649 Anemia, unspecified: Secondary | ICD-10-CM | POA: Diagnosis not present

## 2020-03-23 NOTE — Progress Notes (Addendum)
OFFICE NOTE  Chief Complaint:  Follow-up hospitalization  Primary Care Physician: [name], MD  HPI:  Paul Lam is a 79 year old gentleman with a history of coronary disease with an EF of about 50-55%, also hypertension, dyslipidemia and sleep apnea on CPAP. Recently his EF had been reported to be lower, however, it is actually fairly normal. Overall he has done very well. No chest pain, shortness of breath, palpitations, presyncope or syncopal symptoms. He did have other non-cardiac problems over the past year, including a detached retina, skin cancer with grafting to the left ear, as well as a total knee replacement. He tolerated these surgeries well without any evidence of MI. His only concern today is some positional dizziness. He says that he gets dizzy when he bends over too quickly and sits up. He also gets dizzy sometimes when turning his head to the side. It was thought this might be due to positional orthostasis and his Lasix was held recently but there has been no significant change in his symptoms. During his episodes he also feels like there is some spinning of the room which sounds like vertigo to me.  Paul Lam returns today he is doing fairly well. He has felt somewhat fatigued recently however and it is noted that he is bradycardic today. He still get some positional dizziness and had one episode of vertigo. He was seen at [facility] and they thought that there was possibly some insufficiency to the brain may be vertebrobasilar insufficiency that was leading to his vestibular problems.  Some Paul Lam back in the office today. Overall he is doing fairly well. He continues to struggle with vertigo and positional dizziness, mainly when bending over. He has chronic lower extremity edema which is likely due to venous hypertension. I've offered venous Dopplers before but he seems to be asymptomatic with it and he is declined. He's followed for sleep apnea by Dr. Radford Pax  and saw her recently and seems to be fairly compliant with this regimen. He takes furosemide several times a week for swelling.  01/04/2016  Paul Lam returns today for follow-up. He denies any chest pain or worsening shortness of breath. Unfortunately weight is up somewhat. He is not exercising regularly. Blood pressure is well-controlled at 120/56. He had recent follow-up with his primary care provider reported his cholesterol is at goal as well. He is going to see his urologist in a few weeks. He is compliant with CPAP and generally feels that he sleeps well at night.  01/23/2017  Paul Lam was seen today in follow-up. Today he is very sad and is accompanied by his wife. Their grandson recently committed suicide. I was to commend him on his weight loss, but I suspect the weight loss is been due to poor appetite since his grieving. He's also had some depression is now on medications. Overall he denies any chest pain or worsening shortness of breath. His blood pressure actually looks much better today, probably attributed to the weight loss. EKG is sinus rhythm.  03/09/2018  Paul Lam is seen today in annual follow-up.  He seemed to be doing well although did break down when he recalled their grandson who committed suicide last year and told me that his granddaughter also had attempted suicide by overdose.  Recently he had new onset atrial fibrillation and underwent cardioversion.  He followed up with Roderic Palau in the A. fib clinic and is been on Eliquis.  He seems to be maintaining sinus rhythm.  Otherwise PaulBarbier has had no new issues.  Weight still is an issue.  Blood pressure was little elevated today however did come down.  Labs from July showed total cholesterol 113, HDL 44, LDL 61 and triglycerides 41.  03/21/2019  Paul Lam returns today for follow-up.  Recently was hospitalized and had urinary tract infection.  This was complicated by recurrent atrial fibrillation.  He had had a  cardioversion last year and maintained sinus.  According to him he thought he felt somewhat better after getting back to sinus rhythm however after this episode where he recently went into A. fib, he said he could not tell the difference.  He was therefore rate controlled and placed on anticoagulation.  He did follow-up with Roderic Palau, NP in the A. fib clinic.  At the time she felt this could be possibly ERAF, but was unclear whether he needed antiarrhythmic therapy.  Today he returns and he remains in atrial fibrillation.  He says he monitors it on his apple watch.  In general other than being alerted by his watch he has no symptoms related to it.  His only other complaint is he gets some occasional lower extremity swelling but thinks may be related to salt indiscretion.  03/23/2020  Paul Lam returns for annual follow-up this is also a hospitalization follow-up.  He was discharged from [facility] on March 07, 2020 after several days of evaluation for symptomatic anemia and possible diastolic heart failure.  Repeat echo was performed which showed normal EF of 60 to 49% and diastolic function could not be evaluated.  His right atrial pressure was elevated.  Reportedly he had acute on chronic lower extremity edema and his diuretics were increased.  He is currently on Lasix and denies any shortness of breath, orthopnea, or other heart failure symptoms.  He has chronic stocking wear.  The etiology of his anemia has not been found.  He does stay on Eliquis for atrial fibrillation.  He reported the Eliquis was not discontinued.  He had endoscopies and has a follow-up next month with GI.  EKG today shows A. fib with slow ventricular response.  PMHx:  Past Medical History:  Diagnosis Date  . Anginal pain ([place])   . Anxiety   . Arthritis    generalized arthritis   . Atrial fibrillation ([place])   . CAD (coronary artery disease)   . Cancer ([place])    skin cancer removed left ear    . Complication of  anesthesia   . Depression   . Headache   . History of echocardiogram 02/19/2009   EF 50-55%; LA mild-mod dilated;   . History of kidney stones   . History of nuclear stress test 02/19/2009   low risk, normal   . Hyperlipidemia   . Hypertension   . Obesity (BMI 30-39.9)   . OSA (obstructive sleep apnea)    severe with AHI 25/hr now on BiPAP  . PONV (postoperative nausea and vomiting)   . Retinal detachment    with proliferative vitreoretinopathy  . Shortness of breath    related to weight   . Sleep apnea   . Vertigo     Past Surgical History:  Procedure Laterality Date  . APPENDECTOMY    . BIOPSY  03/05/2020   Procedure: BIOPSY;  Surgeon: Rogene Houston, MD;  [facility];  Service: Endoscopy;;  . CARDIAC CATHETERIZATION  07/13/2005   noncritical CAD  . CARDIOVERSION N/A 01/29/2018   Procedure: CARDIOVERSION;  Surgeon:  Dorothy Spark, MD;  Location: [place];  Service: Cardiovascular;  Laterality: N/A;  . COLONOSCOPY WITH PROPOFOL N/A 03/06/2020   Procedure: COLONOSCOPY WITH PROPOFOL;  Surgeon: Harvel Quale, MD;  [facility];  Service: Gastroenterology;  Laterality: N/A;  . ESOPHAGOGASTRODUODENOSCOPY (EGD) WITH PROPOFOL N/A 03/05/2020   Procedure: ESOPHAGOGASTRODUODENOSCOPY (EGD) WITH PROPOFOL;  Surgeon: Rogene Houston, MD;  [facility];  Service: Endoscopy;  Laterality: N/A;  . EYE SURGERY    . FLEXIBLE SIGMOIDOSCOPY N/A 07/14/2015   Procedure: FLEXIBLE SIGMOIDOSCOPY;  Surgeon: Aviva Signs, MD;  [facility];  Service: Gastroenterology;  Laterality: N/A;  . JOINT REPLACEMENT    . NASAL SEPTOPLASTY W/ TURBINOPLASTY Bilateral 12/19/2017   Procedure: NASAL SEPTOPLASTY WITH TURBINATE REDUCTION;  Surgeon: [name], MD;  Location: [place];  Service: ENT;  Laterality: Bilateral;  . NASAL SINUS SURGERY    . PARS PLANA VITRECTOMY Left 06/20/2017   Procedure: PARS PLANA VITRECTOMY WITH 25 GAUGE, REPAIR OF  COMPLEX TRACTION RETINAL DETACHMENT, MEMBRANE PEEEL;  Surgeon: [name], MD;  Location: [facility];  Service: Ophthalmology;  Laterality: Left;  . POLYPECTOMY  03/06/2020   Procedure: POLYPECTOMY;  Surgeon: Harvel Quale, MD;  [facility];  Service: Gastroenterology;;  . REPAIR OF COMPLEX TRACTION RETINAL DETACHMENT Left 06/20/2017  . RETINAL DETACHMENT SURGERY Right 04/2011  . SCLERAL BUCKLE WITH POSSIBLE 25 GAUGE PARS PLANA VITRECTOMY Left 05/22/2017   Procedure: SCLERAL BUCKLE, LASER, GAS INJECTION LEFT EYE;  Surgeon: [name], MD;  Location: [place];  Service: Ophthalmology;  Laterality: Left;  . SKIN CANCER EXCISION Left 2013   left ear skin ca removed  2 weeks ago - not completeely healed 08/15/11   . TONSILLECTOMY    . TOTAL KNEE ARTHROPLASTY  08/22/2011   Procedure: TOTAL KNEE ARTHROPLASTY;  Surgeon: Gearlean Alf, MD;  Location[facility];  Service: Orthopedics;  Laterality: Right;    FAMHx:  Family History  Problem Relation Age of Onset  . Heart failure Mother        also arthritis  . Stroke Father        also emphysema  . Heart failure Father     SOCHx:   reports that he has never smoked. He has never used smokeless tobacco. He reports that he does not drink alcohol and does not use drugs.  ALLERGIES:  No Known Allergies  ROS: Pertinent items noted in HPI and remainder of comprehensive ROS otherwise negative.  HOME MEDS: Current Outpatient Medications  Medication Sig Dispense Refill  . ALPRAZolam (XANAX) 0.5 MG tablet Take 0.5 mg at bedtime by mouth. Anxiety     . Ascorbic Acid (VITAMIN C WITH ROSE HIPS) 500 MG tablet Take 500 mg by mouth daily.    . brimonidine (ALPHAGAN) 0.15 % ophthalmic solution Place 1 drop into the left eye 2 (two) times daily.  3  . Calcium Polycarbophil (FIBER-CAPS PO) Take by mouth.    . Cholecalciferol (VITAMIN D3 PO) Take by mouth.    . docusate sodium (COLACE) 100 MG capsule Take 100 mg by mouth 2 (two)  times daily.    Marland Kitchen ELIQUIS 5 MG TABS tablet TAKE (1) TABLET TWICE DAILY. 180 tablet 1  . finasteride (PROSCAR) 5 MG tablet Take 1 tablet (5 mg total) by mouth daily with supper. 90 tablet 3  . furosemide (LASIX) 40 MG tablet Take 1 tablet (40 mg total) by mouth daily. 30 tablet 6  . lisinopril (  ZESTRIL) 5 MG tablet Take 5 mg by mouth daily.    . metoprolol tartrate (LOPRESSOR) 25 MG tablet Take 1 tablet (25 mg total) by mouth 2 (two) times daily. 60 tablet 1  . Misc Natural Products (TART CHERRY ADVANCED PO) Take 1 tablet by mouth daily.     . NON FORMULARY at bedtime. VPAP    . potassium chloride (K-DUR) 10 MEQ tablet Take 10 mEq daily as needed by mouth (with the lasix for fluid retention.). Take with Lasix  0  . simvastatin (ZOCOR) 40 MG tablet Take 40 mg by mouth at bedtime.    . tamsulosin (FLOMAX) 0.4 MG CAPS capsule Take 1 capsule (0.4 mg total) by mouth in the morning and at bedtime. Take 1 capsule (0.4 mg) by mouth daily with supper (~1830) & take 1 capsule (0.4 mg) by mouth at bedtime. 180 capsule 3  . Zinc 50 MG TABS Take by mouth.     No current facility-administered medications for this visit.    LABS/IMAGING: No results found for this or any previous visit (from the past 48 hour(s)). No results found.  VITALS: BP 111/66   Pulse (!) 58   Ht 5\' 8"  (1.727 m)   Wt 221 lb 9.6 oz (100.5 kg)   BMI 33.69 kg/m   EXAM: General appearance: alert and no distress Neck: no carotid bruit, no JVD and thyroid not enlarged, symmetric, no tenderness/mass/nodules Lungs: clear to auscultation bilaterally Heart: regular rate and rhythm, S1, S2 normal, no murmur, click, rub or gallop Abdomen: soft, non-tender; bowel sounds normal; no masses,  no organomegaly Extremities: extremities normal, atraumatic, no cyanosis or edema Pulses: 2+ and symmetric Skin: Skin color, texture, turgor normal. No rashes or lesions Neurologic: GroPsych: Pleasanty normal Psych: Pleasant  EKG: A. fib with a  slow ventricular response at 58 -personally reviewed  ASSESSMENT: 1. Persistent atrial fibrillation-CHADSVASC score of 4, on Eliquis, previously cardioverted however reverted back to A. fib-asymptomatic 2. Hypertension 3. Dyslipidemia 4. Obesity 5. Dizziness 6. Anxiety 7. OSA on CPAP 8. Chronic lower extremity edema secondary to venous hypertension 9. Symptomatic anemia  PLAN: 1.   Mr. Hallum was recently hospitalized with symptomatic anemia of unknown etiology.  He may likely have small vessel AVMs.  He has follow-up with GI next month.  He has remained on Eliquis.  If he continues to struggle with anemia and concern for possible bleeding source we may wish to consider referral to be evaluated for watchman device.  I discussed this briefly with him today and feel that he could tolerate a short course of anticoagulation but if successful this could possibly obviate the need for long-term anticoagulation.  With regards to diastolic heart failure this seems to be better controlled.  He did have outpatient lab work by Dr. Hilma Favors in September which indicated a BNP of 173, mildly elevated.  I would recommend continuing 40 mg daily Lasix.  I have seen SAVIOR HIMEBAUGH in the office today.  He was referred by [name], MD for consideration for Left Atrial Appendage Closure with the Watchman Device for management of stroke risk. Based upon past history, it has been determined that he is a poor candidate for long-term oral anticoagulation.  However, he may be tolerant of short-term treatment with an anticoagulant as necessary.  A shared decision has been made utilizing the [employer] of Cardiology shared decision tool to undergo Left Atrial Appendage Closure with Watchman device at this time.    Follow-up with  me in 6 months or sooner if necessary.  Pixie Casino, MD, [facility] [place] Director of the Advanced Lipid Disorders &  Cardiovascular  Risk Reduction Clinic Diplomate of the American Board of Clinical Lipidology Attending Cardiologist  Direct Dial: [phone]  Fax: [phone]  Website:  [email or web address] 03/23/2020, 11:31 AM

## 2020-03-23 NOTE — Patient Instructions (Signed)
Medication Instructions:  Your physician recommends that you continue on your current medications as directed. Please refer to the Current Medication list given to you today.  *If you need a refill on your cardiac medications before your next appointment, please call your pharmacy*   Lab Work: NONE If you have labs (blood work) drawn today and your tests are completely normal, you will receive your results only by:  [place] (if you have MyChart) OR  A paper copy in the mail If you have any lab test that is abnormal or we need to change your treatment, we will call you to review the results.   Testing/Procedures: NONE   Follow-Up: At [facility], you and your health needs are our priority.  As part of our continuing mission to provide you with exceptional heart care, we have created designated Provider Care Teams.  These Care Teams include your primary Cardiologist (physician) and Advanced Practice Providers (APPs -  Physician Assistants and Nurse Practitioners) who all work together to provide you with the care you need, when you need it.  We recommend signing up for the patient portal called "MyChart".  Sign up information is provided on this After Visit Summary.  MyChart is used to connect with patients for Virtual Visits (Telemedicine).  Patients are able to view lab/test results, encounter notes, upcoming appointments, etc.  Non-urgent messages can be sent to your provider as well.   To learn more about what you can do with MyChart, go to [email or web address].    Your next appointment:   6 month(s)  The format for your next appointment:   In Person  Provider:   You may see Pixie Casino, MD or one of the following Advanced Practice Providers on your designated Care Team:    Almyra Deforest, PA-C  Fabian Sharp, PA-C or   Roby Lofts, [place]    Other Instructions

## 2020-03-30 ENCOUNTER — Other Ambulatory Visit: Payer: Self-pay | Admitting: Internal Medicine

## 2020-04-02 DIAGNOSIS — I4891 Unspecified atrial fibrillation: Secondary | ICD-10-CM | POA: Diagnosis not present

## 2020-04-02 DIAGNOSIS — I5030 Unspecified diastolic (congestive) heart failure: Secondary | ICD-10-CM | POA: Diagnosis not present

## 2020-04-02 DIAGNOSIS — D649 Anemia, unspecified: Secondary | ICD-10-CM | POA: Diagnosis not present

## 2020-04-02 DIAGNOSIS — Z6835 Body mass index (BMI) 35.0-35.9, adult: Secondary | ICD-10-CM | POA: Diagnosis not present

## 2020-04-04 DIAGNOSIS — E7849 Other hyperlipidemia: Secondary | ICD-10-CM | POA: Diagnosis not present

## 2020-04-04 DIAGNOSIS — I4891 Unspecified atrial fibrillation: Secondary | ICD-10-CM | POA: Diagnosis not present

## 2020-04-04 DIAGNOSIS — Z6834 Body mass index (BMI) 34.0-34.9, adult: Secondary | ICD-10-CM | POA: Diagnosis not present

## 2020-04-04 DIAGNOSIS — E6609 Other obesity due to excess calories: Secondary | ICD-10-CM | POA: Diagnosis not present

## 2020-04-06 ENCOUNTER — Ambulatory Visit (INDEPENDENT_AMBULATORY_CARE_PROVIDER_SITE_OTHER): Payer: Medicare Other | Admitting: Gastroenterology

## 2020-04-06 DIAGNOSIS — H5213 Myopia, bilateral: Secondary | ICD-10-CM | POA: Diagnosis not present

## 2020-04-07 DIAGNOSIS — B351 Tinea unguium: Secondary | ICD-10-CM | POA: Diagnosis not present

## 2020-04-07 DIAGNOSIS — L110 Acquired keratosis follicularis: Secondary | ICD-10-CM | POA: Diagnosis not present

## 2020-04-07 DIAGNOSIS — I739 Peripheral vascular disease, unspecified: Secondary | ICD-10-CM | POA: Diagnosis not present

## 2020-04-09 ENCOUNTER — Other Ambulatory Visit: Payer: Self-pay

## 2020-04-09 ENCOUNTER — Encounter (INDEPENDENT_AMBULATORY_CARE_PROVIDER_SITE_OTHER): Payer: Self-pay | Admitting: *Deleted

## 2020-04-09 ENCOUNTER — Encounter (INDEPENDENT_AMBULATORY_CARE_PROVIDER_SITE_OTHER): Payer: Self-pay | Admitting: Gastroenterology

## 2020-04-09 ENCOUNTER — Ambulatory Visit (INDEPENDENT_AMBULATORY_CARE_PROVIDER_SITE_OTHER): Payer: Medicare Other | Admitting: Gastroenterology

## 2020-04-09 VITALS — BP 122/70 | HR 83 | Temp 99.7°F | Ht 68.0 in | Wt 225.3 lb

## 2020-04-09 DIAGNOSIS — I959 Hypotension, unspecified: Secondary | ICD-10-CM

## 2020-04-09 DIAGNOSIS — D649 Anemia, unspecified: Secondary | ICD-10-CM | POA: Diagnosis not present

## 2020-04-09 MED ORDER — FERROUS SULFATE 324 (65 FE) MG PO TBEC: 1.0000 | DELAYED_RELEASE_TABLET | Freq: Every day | ORAL | 5 refills | Status: DC

## 2020-04-09 NOTE — Progress Notes (Signed)
Paul Lam, M.D. Gastroenterology & Hepatology [facility] [address] [place], [place] [address]  Primary Care Physician: [name], MD [address] [place] [address]  I will communicate my assessment and recommendations to the referring MD via EMR. "Note: Occasional unusual wording and randomly placed punctuation marks may result from the use of speech recognition technology to transcribe this document"  Problems: 1. Severe anemia  History of Present Illness: Paul Lam is a 79 y.o. male with past medical history of coronary artery disease, atrial fibrillation on Eliquis, anxiety, depression, hypertension, hyperlipidemia, OSA who presents for follow up of severe anemia.  The patient was hospitalized on 03/04/2020 after presenting worsening lightheadedness and being found to have new onset anemia of 7.9, reason for which he was advised to come to the ER at [facility] by his physician.  The patient was transfused 1 unit of PRBC with adequate response and improvement of his hemoglobin to 8.7.  During his hospitalization the patient underwent an EGD Which showed presence of a 2 cm hiatal hernia and changes suspicious for Barrett's esophagus which were biopsied, pathology showed changes consistent with acid reflux but no intestinal metaplasia.  He subsequently underwent a colonoscopy which showed a 2 mm polyp and diverticulosis but no alterations that would explain his anemia, pathology showed hyperplastic polyp in sigmoid colon. Patient was discharged home with a stable hemoglobin.  The patient states that he has felt lightheaded when he has been bending over but has not presented any syncopal episodes.  However he has felt unsteady and has fell 2 times.  Patient brings blood pressure log and shows systolic blood pressures have oscillated between 100-70.  Recently patient states that his bowel movements have  remained dark green but has not seen any melena or hematochezia.  Has had regular bowel movements, at least once a day. The patient denies having any nausea, vomiting, fever, chills, hematochezia, melena, hematemesis, abdominal distention, abdominal pain, diarrhea, jaundice, pruritus or weight loss.  Past Medical History: Past Medical History:  Diagnosis Date   Anginal pain ([place])    Anxiety    Arthritis    generalized arthritis    Atrial fibrillation (HCC)    CAD (coronary artery disease)    Cancer (HCC)    skin cancer removed left ear     Complication of anesthesia    Depression    Headache    History of echocardiogram 02/19/2009   EF 50-55%; LA mild-mod dilated;    History of kidney stones    History of nuclear stress test 02/19/2009   low risk, normal    Hyperlipidemia    Hypertension    Obesity (BMI 30-39.9)    OSA (obstructive sleep apnea)    severe with AHI 25/hr now on BiPAP   PONV (postoperative nausea and vomiting)    Retinal detachment    with proliferative vitreoretinopathy   Shortness of breath    related to weight    Sleep apnea    Vertigo     Past Surgical History: Past Surgical History:  Procedure Laterality Date   APPENDECTOMY     BIOPSY  03/05/2020   Procedure: BIOPSY;  Surgeon: Rogene Houston, MD;  [facility];  Service: Endoscopy;;   CARDIAC CATHETERIZATION  07/13/2005   noncritical CAD   CARDIOVERSION N/A 01/29/2018   Procedure: CARDIOVERSION;  Surgeon: Dorothy Spark, MD;  Location: [facility] ENDOSCOPY;  Service: Cardiovascular;  Laterality: N/A;   COLONOSCOPY  WITH PROPOFOL N/A 03/06/2020   Procedure: COLONOSCOPY WITH PROPOFOL;  Surgeon: Harvel Quale, MD;  [facility];  Service: Gastroenterology;  Laterality: N/A;   ESOPHAGOGASTRODUODENOSCOPY (EGD) WITH PROPOFOL N/A 03/05/2020   Procedure: ESOPHAGOGASTRODUODENOSCOPY (EGD) WITH PROPOFOL;  Surgeon: Rogene Houston, MD;  [facility];   Service: Endoscopy;  Laterality: N/A;   EYE SURGERY     FLEXIBLE SIGMOIDOSCOPY N/A 07/14/2015   Procedure: FLEXIBLE SIGMOIDOSCOPY;  Surgeon: Aviva Signs, MD;  [facility];  Service: Gastroenterology;  Laterality: N/A;   JOINT REPLACEMENT     NASAL SEPTOPLASTY W/ TURBINOPLASTY Bilateral 12/19/2017   Procedure: NASAL SEPTOPLASTY WITH TURBINATE REDUCTION;  Surgeon: [name], MD;  Location: [place];  Service: ENT;  Laterality: Bilateral;   NASAL SINUS SURGERY     PARS PLANA VITRECTOMY Left 06/20/2017   Procedure: PARS PLANA VITRECTOMY WITH 25 GAUGE, REPAIR OF COMPLEX TRACTION RETINAL DETACHMENT, MEMBRANE PEEEL;  Surgeon: [name], MD;  Location: [place];  Service: Ophthalmology;  Laterality: Left;   POLYPECTOMY  03/06/2020   Procedure: POLYPECTOMY;  Surgeon: Harvel Quale, MD;  [facility];  Service: Gastroenterology;;   REPAIR OF COMPLEX TRACTION RETINAL DETACHMENT Left 06/20/2017   RETINAL DETACHMENT SURGERY Right 04/2011   SCLERAL BUCKLE WITH POSSIBLE 25 GAUGE PARS PLANA VITRECTOMY Left 05/22/2017   Procedure: SCLERAL BUCKLE, LASER, GAS INJECTION LEFT EYE;  Surgeon: [name], MD;  Location: [place];  Service: Ophthalmology;  Laterality: Left;   SKIN CANCER EXCISION Left 2013   left ear skin ca removed  2 weeks ago - not completeely healed 08/15/11    TONSILLECTOMY     TOTAL KNEE ARTHROPLASTY  08/22/2011   Procedure: TOTAL KNEE ARTHROPLASTY;  Surgeon: Gearlean Alf, MD;  Location: [facility];  Service: Orthopedics;  Laterality: Right;    Family History: Family History  Problem Relation Age of Onset   Heart failure Mother        also arthritis   Stroke Father        also emphysema   Heart failure Father     Social History: Social History   Tobacco Use  Smoking Status Never Smoker  Smokeless Tobacco Never Used   Social History   Substance and Sexual Activity  Alcohol Use No   Social History    Substance and Sexual Activity  Drug Use No    Allergies: No Known Allergies  Medications: Current Outpatient Medications  Medication Sig Dispense Refill   ALPRAZolam (XANAX) 0.5 MG tablet Take 0.5 mg at bedtime by mouth. Anxiety      Ascorbic Acid (VITAMIN C WITH ROSE HIPS) 500 MG tablet Take 500 mg by mouth daily.     brimonidine (ALPHAGAN) 0.15 % ophthalmic solution Place 1 drop into the left eye 2 (two) times daily.  3   Calcium Polycarbophil (FIBER-CAPS PO) Take by mouth.     Cholecalciferol (VITAMIN D3 PO) Take by mouth.     diphenhydrAMINE (BENADRYL) 25 mg capsule Take 25 mg by mouth at bedtime as needed.     docusate sodium (COLACE) 100 MG capsule Take 100 mg by mouth 2 (two) times daily.     ELIQUIS 5 MG TABS tablet TAKE (1) TABLET TWICE DAILY. 180 tablet 1   finasteride (PROSCAR) 5 MG tablet Take 1 tablet (5 mg total) by mouth daily with supper. 90 tablet 3   furosemide (LASIX) 40 MG tablet Take 1 tablet (40 mg total) by mouth daily. [place]  tablet 6   lisinopril (ZESTRIL) 5 MG tablet Take 5 mg by mouth daily.     metoprolol tartrate (LOPRESSOR) 25 MG tablet Take 1 tablet (25 mg total) by mouth 2 (two) times daily. 60 tablet 1   Misc Natural Products (TART CHERRY ADVANCED PO) Take 1 tablet by mouth daily.      nabumetone (RELAFEN) 750 MG tablet Take 750 mg by mouth daily.     NON FORMULARY at bedtime. VPAP     potassium chloride (K-DUR) 10 MEQ tablet Take 10 mEq daily as needed by mouth (with the lasix for fluid retention.). Take with Lasix  0   psyllium (REGULOID) 0.52 g capsule Take 0.52 g by mouth daily.     simvastatin (ZOCOR) 40 MG tablet Take 40 mg by mouth at bedtime.     tamsulosin (FLOMAX) 0.4 MG CAPS capsule Take 1 capsule (0.4 mg total) by mouth in the morning and at bedtime. Take 1 capsule (0.4 mg) by mouth daily with supper (~1830) & take 1 capsule (0.4 mg) by mouth at bedtime. 180 capsule 3   Zinc 50 MG TABS Take by mouth.     No current  facility-administered medications for this visit.    Review of Systems: GENERAL: negative for malaise, night sweats HEENT: No changes in hearing or vision, no nose bleeds or other nasal problems. NECK: Negative for lumps, goiter, pain and significant neck swelling RESPIRATORY: Negative for cough, wheezing CARDIOVASCULAR: Negative for chest pain, leg swelling, palpitations, orthopnea GI: SEE HPI MUSCULOSKELETAL: Negative for joint pain or swelling, back pain, and muscle pain. SKIN: Negative for lesions, rash PSYCH: Negative for sleep disturbance, mood disorder and recent psychosocial stressors. HEMATOLOGY Negative for prolonged bleeding, bruising easily, and swollen nodes. ENDOCRINE: Negative for cold or heat intolerance, polyuria, polydipsia and goiter. NEURO: negative for tremor, gait imbalance, syncope and seizures. The remainder of the review of systems is noncontributory.   Physical Exam: BP 122/70 (BP Location: Right Arm, Patient Position: Sitting, Cuff Size: Normal)    Pulse 83    Temp 99.7 F (37.6 C) (Oral)    Ht 5\' 8"  (1.727 m)    Wt 225 lb 4.8 oz (102.2 kg)    BMI 34.26 kg/m  GENERAL: The patient is AO x3, in no acute distress. Elder. Obese. HEENT: Head is normocephalic and atraumatic. EOMI are intact. Mouth is well hydrated and without lesions. NECK: Supple. No masses LUNGS: Clear to auscultation. No presence of rhonchi/wheezing/rales. Adequate chest expansion HEART: RRR, normal s1 and s2. ABDOMEN: Soft, nontender, no guarding, no peritoneal signs, and nondistended. BS +. No masses. EXTREMITIES: Without any cyanosis, clubbing, rash, lesions or edema. NEUROLOGIC: AOx3, no focal motor deficit. SKIN: no jaundice, no rashes  Imaging/Labs: as above  I personally reviewed and interpreted the available labs, imaging and endoscopic files.  Impression and Plan: Paul Lam is a 79 y.o. male with past medical history of coronary artery disease, atrial fibrillation on  Eliquis, anxiety, depression, hypertension, hyperlipidemia, OSA who presents for follow up of severe anemia.  The patient had adequate response to blood transfusion while hospitalized and has not presented any episodes of overt gastrointestinal bleeding.  He had endoscopic investigations including EGD and colonoscopy in the hospital which were negative for any source for his anemia.  At this time, we will proceed with a capsule endoscopy to evaluate the presence of any vascular abnormalities or polyps that could be causing his anemia.  However, will reach his cardiologist to evaluate the possibility of  using a watchman device in the future to spare him from being on a blood thinner chronically.  We will check today his iron stores and patient will be started on iron supplementation for now.  - Follow up with PCP regarding BP medication as he has presented significant hypotension episodes - Schedule capsule endoscopy - Start iron supplementation orally -Check CBC and iron stores -We will discuss with his cardiologist possibility of watchman device.  All questions were answered.      Harvel Quale, MD Gastroenterology and Hepatology [facility]

## 2020-04-09 NOTE — Patient Instructions (Signed)
Schedule capsule endoscopy Start iron supplementation orally

## 2020-04-10 LAB — CBC WITH DIFFERENTIAL/PLATELET
Absolute Monocytes: 513 cells/uL (ref 200–950)
Basophils Absolute: 18 cells/uL (ref 0–200)
Basophils Relative: 0.3 %
Eosinophils Absolute: 283 cells/uL (ref 15–500)
Eosinophils Relative: 4.8 %
HCT: 33 % — ABNORMAL LOW (ref 38.5–50.0)
Hemoglobin: 10.4 g/dL — ABNORMAL LOW (ref 13.2–17.1)
Lymphs Abs: 1009 cells/uL (ref 850–3900)
MCH: 27 pg (ref 27.0–33.0)
MCHC: 31.5 g/dL — ABNORMAL LOW (ref 32.0–36.0)
MCV: 85.7 fL (ref 80.0–100.0)
MPV: 9.7 fL (ref 7.5–12.5)
Monocytes Relative: 8.7 %
Neutro Abs: 4077 cells/uL (ref 1500–7800)
Neutrophils Relative %: 69.1 %
Platelets: 135 10*3/uL — ABNORMAL LOW (ref 140–400)
RBC: 3.85 10*6/uL — ABNORMAL LOW (ref 4.20–5.80)
RDW: 17.1 % — ABNORMAL HIGH (ref 11.0–15.0)
Total Lymphocyte: 17.1 %
WBC: 5.9 10*3/uL (ref 3.8–10.8)

## 2020-04-10 LAB — IRON, TOTAL/TOTAL IRON BINDING CAP
%SAT: 16 % (calc) — ABNORMAL LOW (ref 20–48)
Iron: 59 ug/dL (ref 50–180)
TIBC: 373 mcg/dL (calc) (ref 250–425)

## 2020-04-10 LAB — FERRITIN: Ferritin: 24 ng/mL (ref 24–380)

## 2020-04-14 ENCOUNTER — Telehealth: Payer: Self-pay | Admitting: Internal Medicine

## 2020-04-14 DIAGNOSIS — D649 Anemia, unspecified: Secondary | ICD-10-CM

## 2020-04-14 DIAGNOSIS — I4819 Other persistent atrial fibrillation: Secondary | ICD-10-CM

## 2020-04-14 NOTE — Telephone Encounter (Signed)
Spoke with patient who is agreeable to structural heart clinic eval. Referral entered

## 2020-04-14 NOTE — Telephone Encounter (Signed)
From: Pixie Casino, MD  Sent: 04/11/2020 10:43 AM EST  To: Fidel Levy, RN, *  Subject: RE: Paul Lam                   Thanks for the note - happy to refer him to structural heart clinic to discuss Watchman if he is interested.   -[place]  ----- Message -----  From: Harvel Quale, MD  Sent: 04/09/2020  5:30 PM EDT  To: Pixie Casino, MD  Subject: Paul Lam afternoon Dr. Debara Pickett,  I had the pleasure to care of Paul Lam and manage his anemia. We have scheduled him for a capsule endoscopy to evaluate this further as his endoscopic investigations have been negative. His hemoglobin today has improved. However, I wanted to discuss with you the possibility of a watchman as if he has AVMs it is likely he may have recurrent episodes of chronic bleeding in the future. I know you have consider this in the past and the patient was interested in discussing this further.   Thanks,   Paul Peppers, MD  Gastroenterology and Hepatology  [facility]

## 2020-04-15 ENCOUNTER — Encounter (HOSPITAL_COMMUNITY): Payer: Self-pay | Admitting: Gastroenterology

## 2020-04-15 ENCOUNTER — Encounter (HOSPITAL_COMMUNITY)
Admission: RE | Disposition: A | Discharge status: Home / Self Care | Payer: Self-pay | Source: Home / Self Care | Attending: Gastroenterology

## 2020-04-15 ENCOUNTER — Ambulatory Visit (HOSPITAL_COMMUNITY)
Admission: RE | Admit: 2020-04-15 | Discharge: 2020-04-15 | Disposition: A | Discharge status: Home / Self Care | Payer: Medicare Other | Attending: Gastroenterology | Admitting: Gastroenterology

## 2020-04-15 DIAGNOSIS — Z9889 Other specified postprocedural states: Secondary | ICD-10-CM

## 2020-04-15 DIAGNOSIS — D509 Iron deficiency anemia, unspecified: Secondary | ICD-10-CM

## 2020-04-15 HISTORY — PX: GIVENS CAPSULE STUDY: SHX5432

## 2020-04-15 SURGERY — IMAGING PROCEDURE, GI TRACT, INTRALUMINAL, VIA CAPSULE

## 2020-04-15 NOTE — Procedures (Signed)
Small Bowel Givens Capsule Study Procedure date:  04/15/2020  Referring Provider:  PCP PCP:  Dr. [name], MD  Indication for procedure:  Iron deficiency anemia  Findings:  Esophageal, gastric and small bowel lumen were completely patent, no present of mucosal lesions or hematin/blood in the lumen. Colon had presence of significant amount of stool in the lumen precluding adequate visualization.  First Gastric image:  00:04:34 First Duodenal image: 00:18:51 First Cecal image: 03:16:42 Gastric Passage time: 00h 103m Small Bowel Passage time:  02h 72m    Summary & Recommendations: Normal gastrointestinal lining - esophagus, stomach and small bowel  - Continue oral iron supplementation daily - Follow up with structural cardiologist regarding Watchman placement and possibly discontinuing AC in the future - Repeat H/H and iron stores in 3 months  I personally communicated these recommendations to the patient  Maylon Peppers, MD Gastroenterology and Hepatology [facility]

## 2020-04-16 ENCOUNTER — Telehealth: Payer: Self-pay

## 2020-04-16 NOTE — Telephone Encounter (Signed)
Per Dr. Debara Pickett, scheduled patient for [facility] consult with Dr. Burt Knack 11/24. The patient was grateful for call and agrees with plan.

## 2020-04-20 ENCOUNTER — Encounter (HOSPITAL_COMMUNITY): Payer: Self-pay | Admitting: Gastroenterology

## 2020-04-29 ENCOUNTER — Other Ambulatory Visit: Payer: Self-pay

## 2020-04-29 ENCOUNTER — Ambulatory Visit: Payer: Medicare Other | Admitting: Cardiovascular Disease

## 2020-04-29 ENCOUNTER — Encounter: Payer: Self-pay | Admitting: Cardiovascular Disease

## 2020-04-29 VITALS — BP 110/60 | HR 77 | Ht 68.0 in | Wt 226.2 lb

## 2020-04-29 DIAGNOSIS — I4819 Other persistent atrial fibrillation: Secondary | ICD-10-CM | POA: Diagnosis not present

## 2020-04-29 NOTE — Patient Instructions (Addendum)
Medication Instructions:  Your provider recommends that you continue on your current medications as directed. Please refer to the Current Medication list given to you today.  *If you need a refill on your cardiac medications before your next appointment, please call your pharmacy*  Lab Work: TODAY! BMET If you have labs (blood work) drawn today and your tests are completely normal, you will receive your results only by:  [place] (if you have MyChart) OR  A paper copy in the mail If you have any lab test that is abnormal or we need to change your treatment, we will call you to review the results.  Follow-Up: Ambrose Pancoast, the [facility], will be in touch to introduce himself soon. His direct number is [phone] if you need assistance.  AFIB CLINIC INFORMATION (06/10/2020): Your appointment is scheduled on: Wednesday, June 10, 2020 at 10:30AM. Please arrive at 10:15AM for check-in. The AFib Clinic is located in the Heart and Vascular Specialty Clinics at [facility]. Parking instructions/directions: [address] (off [employer]). When you pull in to Entrance C, there is an underground parking garage to your right. The code to enter the garage will be 1111. Take the elevators to the first floor. Follow the signs to the Heart and Vascular Specialty Clinics. You will see registration at the end of the hallway.  Phone number: [phone]   CT INFORMATION: Your cardiac CT will be scheduled at [facility]: [address] [place], [place] [address] [phone]  Please arrive at the [facility] main entrance of [facility] 30 minutes prior to test start time. Proceed to the [facility] Radiology Department (first floor) to check-in and test prep.  Please follow these instructions carefully:  Hold all erectile dysfunction medications at least 3 days (72 hrs) prior to test.  On the Night Before the Test:  Be sure to Drink plenty of  water.  Do not consume any caffeinated/decaffeinated beverages or chocolate 12 hours prior to your test.  Do not take any antihistamines 12 hours prior to your test.  On the Day of the Test:  Drink plenty of water. Do not drink any water within one hour of the test.  Do not eat any food 4 hours prior to the test.  You may take your regular medications prior to the test EXCEPT: o HOLD FUROSEMIDE the morning of your CT o Instead of metoprolol 25 mg as usual, take metoprolol 50 mg the morning of the test only (then resume 25 mg twice daily that night)      After the Test:  Drink plenty of water.  After receiving IV contrast, you may experience a mild flushed feeling. This is normal.  On occasion, you may experience a mild rash up to 24 hours after the test. This is not dangerous. If this occurs, you can take Benadryl 25 mg and increase your fluid intake.  If you experience trouble breathing, this can be serious. If it is severe call 911 IMMEDIATELY. If it is mild, please call our office.  If you take any of these medications: Glipizide/Metformin, Avandament, Glucavance, please do not take 48 hours after completing test unless otherwise instructed.   Once we have confirmed authorization from your insurance company, we will call you to set up a date and time for your test. Based on how quickly your insurance processes prior authorizations requests, please allow up to 4 weeks to be contacted for scheduling your Cardiac CT appointment. Be advised that  routine Cardiac CT appointments could be scheduled as many as 8 weeks after your provider has ordered it.  For non-scheduling related questions, please contact the cardiac imaging nurse navigator should you have any questions/concerns: Marchia Bond, Cardiac Imaging Nurse Navigator Burley Saver, Interim Cardiac Imaging Nurse [place] and Vascular Services Direct Office Dial: [phone]   For scheduling needs, including  cancellations and rescheduling, please call [place], [phone] (temporary number).

## 2020-04-29 NOTE — Progress Notes (Signed)
Watchman Consult Note   Date:  04/29/2020   ID:  Paul Lam, DOB 12/01/1940, MRN [id number]  PCP:  [name], MD  Cardiologist:  Dr Debara Pickett Primary Electrophysiologist: none Referring Physician: Dr Debara Pickett   CC: to discuss Watchman implant    History of Present Illness: Paul Lam is a 79 y.o. male referred by Dr Debara Pickett for evaluation of atrial fibrillation and stroke prevention. He has persistent atrial fibrillation as well as chronic diastolic heart failure.  The patient has been evaluated by their referring physician and is felt to be a poor candidate for long term [place] due to chronic GI bleeding.  He therefore presents today for Watchman evaluation.   The patient is here with his wife today. Today, he denies symptoms of chest pain, shortness of breath, orthopnea, PND, lower extremity edema, claudication,  presyncope, syncope, bleeding, or neurologic sequela. The patient is tolerating medications without difficulties and is otherwise without complaint today. [place] and he tries to stay as active as possible.  He does experience some dizziness with postural change.  He also has occasional heart palpitations.  He was hospitalized in September for treatment of symptomatic anemia when his HgB was less than 8. He received 1 unit of PRBC's and has been on iron as well. He has undergone EGD, colonoscopy, and capsule endoscopy, with no clear identification of a bleeding source.    Past Medical History:  Diagnosis Date  . Anginal pain ([place])   . Anxiety   . Arthritis    generalized arthritis   . Atrial fibrillation ([place])   . CAD (coronary artery disease)   . Cancer ([place])    skin cancer removed left ear    . Complication of anesthesia   . Depression   . Headache   . History of echocardiogram 02/19/2009   EF 50-55%; LA mild-mod dilated;   . History of kidney stones   . History of nuclear stress test 02/19/2009   low risk, normal   . Hyperlipidemia   . Hypertension    . Obesity (BMI 30-39.9)   . OSA (obstructive sleep apnea)    severe with AHI 25/hr now on BiPAP  . PONV (postoperative nausea and vomiting)   . Retinal detachment    with proliferative vitreoretinopathy  . Shortness of breath    related to weight   . Sleep apnea   . Vertigo    Past Surgical History:  Procedure Laterality Date  . APPENDECTOMY    . BIOPSY  03/05/2020   Procedure: BIOPSY;  Surgeon: Rogene Houston, MD;  [facility];  Service: Endoscopy;;  . CARDIAC CATHETERIZATION  07/13/2005   noncritical CAD  . CARDIOVERSION N/A 01/29/2018   Procedure: CARDIOVERSION;  Surgeon: Dorothy Spark, MD;  Location: [facility];  Service: Cardiovascular;  Laterality: N/A;  . COLONOSCOPY WITH PROPOFOL N/A 03/06/2020   Procedure: COLONOSCOPY WITH PROPOFOL;  Surgeon: Harvel Quale, MD;  [facility];  Service: Gastroenterology;  Laterality: N/A;  . ESOPHAGOGASTRODUODENOSCOPY (EGD) WITH PROPOFOL N/A 03/05/2020   Procedure: ESOPHAGOGASTRODUODENOSCOPY (EGD) WITH PROPOFOL;  Surgeon: Rogene Houston, MD;  [facility];  Service: Endoscopy;  Laterality: N/A;  . EYE SURGERY    . FLEXIBLE SIGMOIDOSCOPY N/A 07/14/2015   Procedure: FLEXIBLE SIGMOIDOSCOPY;  Surgeon: Aviva Signs, MD;  [facility];  Service: Gastroenterology;  Laterality: N/A;  . GIVENS CAPSULE STUDY N/A 04/15/2020   Procedure: GIVENS CAPSULE STUDY;  Surgeon: Montez Morita,  Quillian Quince, MD;  [facility];  Service: Gastroenterology;  Laterality: N/A;  730  . JOINT REPLACEMENT    . NASAL SEPTOPLASTY W/ TURBINOPLASTY Bilateral 12/19/2017   Procedure: NASAL SEPTOPLASTY WITH TURBINATE REDUCTION;  Surgeon: [name], MD;  Location: [facility];  Service: ENT;  Laterality: Bilateral;  . NASAL SINUS SURGERY    . PARS PLANA VITRECTOMY Left 06/20/2017   Procedure: PARS PLANA VITRECTOMY WITH 25 GAUGE, REPAIR OF COMPLEX TRACTION RETINAL DETACHMENT, MEMBRANE PEEEL;   Surgeon: [name], MD;  Location: [facility];  Service: Ophthalmology;  Laterality: Left;  . POLYPECTOMY  03/06/2020   Procedure: POLYPECTOMY;  Surgeon: Harvel Quale, MD;  [facility];  Service: Gastroenterology;;  . REPAIR OF COMPLEX TRACTION RETINAL DETACHMENT Left 06/20/2017  . RETINAL DETACHMENT SURGERY Right 04/2011  . SCLERAL BUCKLE WITH POSSIBLE 25 GAUGE PARS PLANA VITRECTOMY Left 05/22/2017   Procedure: SCLERAL BUCKLE, LASER, GAS INJECTION LEFT EYE;  Surgeon: [name], MD;  Location: [facility];  Service: Ophthalmology;  Laterality: Left;  . SKIN CANCER EXCISION Left 2013   left ear skin ca removed  2 weeks ago - not completeely healed 08/15/11   . TONSILLECTOMY    . TOTAL KNEE ARTHROPLASTY  08/22/2011   Procedure: TOTAL KNEE ARTHROPLASTY;  Surgeon: Gearlean Alf, MD;  [facility];  Service: Orthopedics;  Laterality: Right;     Current Outpatient Medications  Medication Sig Dispense Refill  . ALPRAZolam (XANAX) 0.5 MG tablet Take 0.5 mg at bedtime by mouth. Anxiety     . Ascorbic Acid (VITAMIN C) 1000 MG tablet Take 1,000 mg by mouth daily.    . brimonidine (ALPHAGAN) 0.15 % ophthalmic solution Place 1 drop into the left eye 2 (two) times daily.  3  . Calcium Polycarbophil (FIBER-CAPS PO) Take by mouth.    . Cholecalciferol (VITAMIN D3) 50 MCG (2000 UT) TABS Take by mouth.    Arne Cleveland 5 MG TABS tablet TAKE (1) TABLET TWICE DAILY. 180 tablet 1  . finasteride (PROSCAR) 5 MG tablet Take 1 tablet (5 mg total) by mouth daily with supper. 90 tablet 3  . furosemide (LASIX) 40 MG tablet Take 1 tablet (40 mg total) by mouth daily. 30 tablet 6  . Iron-FA-B Cmp-C-Biot-Probiotic (FUSION PLUS) CAPS Take by mouth daily.     Marland Kitchen lisinopril (ZESTRIL) 5 MG tablet Take 5 mg by mouth daily.    . metoprolol tartrate (LOPRESSOR) 25 MG tablet Take 1 tablet (25 mg total) by mouth 2 (two) times daily. 60 tablet 1  . Misc Natural Products (TART CHERRY ADVANCED PO) Take 1  tablet by mouth daily.     . NON FORMULARY at bedtime. VPAP    . potassium chloride (KLOR-CON) 10 MEQ tablet Take 10 mEq by mouth daily.    . psyllium (REGULOID) 0.52 g capsule Take 0.52 g by mouth daily.    Marland Kitchen senna-docusate ([employer] STOOL SOFTENER) 8.6-50 MG tablet Take 1 tablet by mouth daily.    . simvastatin (ZOCOR) 40 MG tablet Take 40 mg by mouth at bedtime.    . tamsulosin (FLOMAX) 0.4 MG CAPS capsule Take 1 capsule (0.4 mg total) by mouth in the morning and at bedtime. Take 1 capsule (0.4 mg) by mouth daily with supper (~1830) & take 1 capsule (0.4 mg) by mouth at bedtime. 180 capsule 3  . Zinc 50 MG TABS Take by mouth.     No current facility-administered medications for this visit.  Allergies:   Patient has no known allergies.   Social History:  The patient  reports that he has never smoked. He has never used smokeless tobacco. He reports that he does not drink alcohol and does not use drugs.   Family History:  The patient's family history includes Heart failure in his father and mother; Stroke in his father.    ROS:  Please see the history of present illness.   All other systems are reviewed and negative.    PHYSICAL EXAM: VS:  BP 110/60   Pulse 77   Ht 5\' 8"  (1.727 m)   Wt 226 lb 3.2 oz (102.6 kg)   SpO2 98%   BMI 34.39 kg/m  , BMI Body mass index is 34.39 kg/m. GEN: Well nourished, well developed, in no acute distress  HEENT: normal  Neck: no JVD, carotid bruits, or masses Cardiac: irregularly irregular; no murmurs, rubs, or gallops,no edema  Respiratory:  clear to auscultation bilaterally, normal work of breathing GI: soft, nontender, nondistended, + BS MS: no deformity or atrophy  Skin: warm and dry  Neuro:  Strength and sensation are intact Psych: euthymic mood, full affect  EKG:  EKG is not ordered today.   Recent Labs: 03/03/2020: B Natriuretic Peptide 117.0 03/06/2020: ALT 9 03/07/2020: BUN 15; Creatinine, Ser 0.99; Potassium 4.2; Sodium 140 04/09/2020:  Hemoglobin 10.4; Platelets 135    Lipid Panel  No results found for: CHOL, TRIG, HDL, CHOLHDL, VLDL, LDLCALC, LDLDIRECT   Wt Readings from Last 3 Encounters:  04/29/20 226 lb 3.2 oz (102.6 kg)  04/09/20 225 lb 4.8 oz (102.2 kg)  03/23/20 221 lb 9.6 oz (100.5 kg)      Other studies Reviewed: Additional studies/ records that were reviewed today include:  Echo 03/04/2020: IMPRESSIONS    1. Left ventricular ejection fraction, by estimation, is 60 to 65%. The  left ventricle has normal function. The left ventricle has no regional  wall motion abnormalities. Left ventricular diastolic parameters are  indeterminate.  2. Right ventricular systolic function is normal. The right ventricular  size is normal. There is normal pulmonary artery systolic pressure. The  estimated right ventricular systolic pressure is 73.5 mmHg.  3. Left atrial size was moderately dilated.  4. The mitral valve is abnormal, mildly thickened and calcified. Mild  mitral valve regurgitation.  5. The aortic valve is tricuspid. There is moderate calcification of the  aortic valve. Aortic valve regurgitation is mild. Mild to moderate aortic  valve stenosis. Aortic regurgitation PHT measures 725 msec. Aortic valve  mean gradient measures 11.3 mmHg.  Aortic valve Vmax measures 2.29 m/s.  6. The inferior vena cava is dilated in size with >50% respiratory  variability, suggesting right atrial pressure of 8 mmHg.    ASSESSMENT AND PLAN:  1.  Persistent atrial fibrillation I have seen Paul Lam is a 79 y.o. male in the office today who has been referred by Dr Debara Pickett for a Watchman left atrial appendage closure device.  He has a history of persistent atrial fibrillation.  This patients CHA2DS2-VASc Score and unadjusted Ischemic Stroke Rate (% per year) is equal to 4.8 % stroke rate/year from a score of 4 which necessitates long term oral anticoagulation to prevent stroke. HasBled score is 4.  Unfortunately, He is  not felt to be a long term anticoagulation candidate secondary to chronic GI blood loss without clear source, probable AVM's.  The patients chart has been reviewed and I along with their referring cardiologist feel that they  would be a candidate for short term oral anticoagulation.  I have reviewed the notes of the patient's primary cardiologist, Dr Debara Pickett, and his gastroenterologist, Dr Catalina Lunger. Both feel that Watchman should be considered to avoid the need for long-term anticoagulation. Procedural risks for the Watchman implant have been reviewed with the patient including a 1% risk of stroke, 2% risk of perforation, 0.1% risk of device embolization.  I demonstrated a procedural animation of watchman left atrial appendage occlusion to the patient and his wife today.  We specifically discussed procedural steps and potential complications.  We also discussed the postprocedural anticoagulation recommendation of 6 weeks oral anticoagulation plus aspirin, followed by a total of 6 months dual antiplatelet therapy with aspirin and clopidogrel as long as there is a good seal of the left atrial appendage.  Given the patient's poor candidacy for long-term oral anticoagulation, ability to tolerate short term oral anticoagulation, I have recommended the watchman left atrial appendage closure system.    Prior to the procedure, I would like to obtain a gated CT scan of the chest with contrast timed for PV/LA visualization. .  Once these are performed I will review with the multidisciplinary team to confirm the patient continues to be a suitable candidate for Watchman implant. If so, we will schedule for the implant procedure at the first available date.  Current medicines are reviewed at length with the patient today.   The patient does not have concerns regarding his medicines.  The following changes were made today:  none  Labs/ tests ordered today include:  No orders of the defined types were placed in this  encounter.    Signed, Sherren Mocha, MD 04/29/2020  12:20 PM     [place] [address] [phone] (office) [phone] (fax)

## 2020-04-30 LAB — BASIC METABOLIC PANEL
BUN/Creatinine Ratio: 18 (ref 10–24)
BUN: 16 mg/dL (ref 8–27)
CO2: 27 mmol/L (ref 20–29)
Calcium: 8.8 mg/dL (ref 8.6–10.2)
Chloride: 100 mmol/L (ref 96–106)
Creatinine, Ser: 0.91 mg/dL (ref 0.76–1.27)
GFR calc Af Amer: 93 mL/min/{1.73_m2} (ref >59)
GFR calc non Af Amer: 80 mL/min/{1.73_m2} (ref >59)
Glucose: 83 mg/dL (ref 65–99)
Potassium: 4.4 mmol/L (ref 3.5–5.2)
Sodium: 140 mmol/L (ref 134–144)

## 2020-05-05 ENCOUNTER — Telehealth (HOSPITAL_COMMUNITY): Payer: Self-pay | Admitting: *Deleted

## 2020-05-05 DIAGNOSIS — E782 Mixed hyperlipidemia: Secondary | ICD-10-CM | POA: Diagnosis not present

## 2020-05-05 DIAGNOSIS — I4891 Unspecified atrial fibrillation: Secondary | ICD-10-CM | POA: Diagnosis not present

## 2020-05-05 DIAGNOSIS — E6609 Other obesity due to excess calories: Secondary | ICD-10-CM | POA: Diagnosis not present

## 2020-05-05 DIAGNOSIS — Z6834 Body mass index (BMI) 34.0-34.9, adult: Secondary | ICD-10-CM | POA: Diagnosis not present

## 2020-05-05 NOTE — Telephone Encounter (Signed)

## 2020-05-07 ENCOUNTER — Ambulatory Visit (HOSPITAL_COMMUNITY)
Admission: RE | Admit: 2020-05-07 | Discharge: 2020-05-07 | Disposition: A | Discharge status: Home / Self Care | Payer: Medicare Other | Source: Ambulatory Visit | Attending: Cardiovascular Disease | Admitting: Cardiovascular Disease

## 2020-05-07 ENCOUNTER — Other Ambulatory Visit: Payer: Self-pay

## 2020-05-07 DIAGNOSIS — I4819 Other persistent atrial fibrillation: Secondary | ICD-10-CM

## 2020-05-07 MED ORDER — IOHEXOL 350 MG/ML SOLN
80.0000 mL | Freq: Once | INTRAVENOUS | Status: AC | PRN
  Administered 2020-05-07: 80 mL via INTRAVENOUS

## 2020-05-11 ENCOUNTER — Encounter (INDEPENDENT_AMBULATORY_CARE_PROVIDER_SITE_OTHER): Payer: Medicare Other | Admitting: Ophthalmology

## 2020-05-11 ENCOUNTER — Other Ambulatory Visit: Payer: Self-pay

## 2020-05-11 DIAGNOSIS — D3131 Benign neoplasm of right choroid: Secondary | ICD-10-CM | POA: Diagnosis not present

## 2020-05-11 DIAGNOSIS — H35373 Puckering of macula, bilateral: Secondary | ICD-10-CM | POA: Diagnosis not present

## 2020-05-11 DIAGNOSIS — H35033 Hypertensive retinopathy, bilateral: Secondary | ICD-10-CM | POA: Diagnosis not present

## 2020-05-11 DIAGNOSIS — H338 Other retinal detachments: Secondary | ICD-10-CM

## 2020-05-11 DIAGNOSIS — H43811 Vitreous degeneration, right eye: Secondary | ICD-10-CM

## 2020-05-11 DIAGNOSIS — I10 Essential (primary) hypertension: Secondary | ICD-10-CM

## 2020-05-18 DIAGNOSIS — G4733 Obstructive sleep apnea (adult) (pediatric): Secondary | ICD-10-CM | POA: Diagnosis not present

## 2020-06-05 DIAGNOSIS — Z6834 Body mass index (BMI) 34.0-34.9, adult: Secondary | ICD-10-CM | POA: Diagnosis not present

## 2020-06-05 DIAGNOSIS — E6609 Other obesity due to excess calories: Secondary | ICD-10-CM | POA: Diagnosis not present

## 2020-06-05 DIAGNOSIS — E782 Mixed hyperlipidemia: Secondary | ICD-10-CM | POA: Diagnosis not present

## 2020-06-05 DIAGNOSIS — I4891 Unspecified atrial fibrillation: Secondary | ICD-10-CM | POA: Diagnosis not present

## 2020-06-10 ENCOUNTER — Encounter (HOSPITAL_COMMUNITY): Payer: Self-pay | Admitting: Physician Assistant

## 2020-06-10 ENCOUNTER — Other Ambulatory Visit: Payer: Self-pay

## 2020-06-10 ENCOUNTER — Ambulatory Visit (HOSPITAL_COMMUNITY)
Admission: RE | Admit: 2020-06-10 | Discharge: 2020-06-10 | Disposition: A | Discharge status: Home / Self Care | Payer: Medicare Other | Source: Ambulatory Visit | Attending: Physician Assistant | Admitting: Physician Assistant

## 2020-06-10 VITALS — BP 102/60 | HR 46 | Ht 68.0 in | Wt 227.4 lb

## 2020-06-10 DIAGNOSIS — R001 Bradycardia, unspecified: Secondary | ICD-10-CM | POA: Diagnosis not present

## 2020-06-10 DIAGNOSIS — E669 Obesity, unspecified: Secondary | ICD-10-CM | POA: Insufficient documentation

## 2020-06-10 DIAGNOSIS — Z7901 Long term (current) use of anticoagulants: Secondary | ICD-10-CM | POA: Insufficient documentation

## 2020-06-10 DIAGNOSIS — Z79899 Other long term (current) drug therapy: Secondary | ICD-10-CM | POA: Insufficient documentation

## 2020-06-10 DIAGNOSIS — I4819 Other persistent atrial fibrillation: Secondary | ICD-10-CM | POA: Insufficient documentation

## 2020-06-10 DIAGNOSIS — Z96651 Presence of right artificial knee joint: Secondary | ICD-10-CM | POA: Diagnosis not present

## 2020-06-10 DIAGNOSIS — I5032 Chronic diastolic (congestive) heart failure: Secondary | ICD-10-CM | POA: Diagnosis not present

## 2020-06-10 DIAGNOSIS — D6869 Other thrombophilia: Secondary | ICD-10-CM | POA: Diagnosis not present

## 2020-06-10 DIAGNOSIS — Z85828 Personal history of other malignant neoplasm of skin: Secondary | ICD-10-CM | POA: Insufficient documentation

## 2020-06-10 DIAGNOSIS — I110 Hypertensive heart disease with heart failure: Secondary | ICD-10-CM | POA: Diagnosis not present

## 2020-06-10 DIAGNOSIS — Z6834 Body mass index (BMI) 34.0-34.9, adult: Secondary | ICD-10-CM | POA: Insufficient documentation

## 2020-06-10 DIAGNOSIS — Z8249 Family history of ischemic heart disease and other diseases of the circulatory system: Secondary | ICD-10-CM | POA: Diagnosis not present

## 2020-06-10 LAB — BASIC METABOLIC PANEL
Anion gap: 9 (ref 5–15)
BUN: 19 mg/dL (ref 8–23)
CO2: 24 mmol/L (ref 22–32)
Calcium: 8.8 mg/dL — ABNORMAL LOW (ref 8.9–10.3)
Chloride: 104 mmol/L (ref 98–111)
Creatinine, Ser: 0.85 mg/dL (ref 0.61–1.24)
GFR, Estimated: >60 mL/min (ref >60)
Glucose, Bld: 94 mg/dL (ref 70–99)
Potassium: 4.9 mmol/L (ref 3.5–5.1)
Sodium: 137 mmol/L (ref 135–145)

## 2020-06-10 LAB — CBC
HCT: 35.8 % — ABNORMAL LOW (ref 39.0–52.0)
Hemoglobin: 10.7 g/dL — ABNORMAL LOW (ref 13.0–17.0)
MCH: 27.2 pg (ref 26.0–34.0)
MCHC: 29.9 g/dL — ABNORMAL LOW (ref 30.0–36.0)
MCV: 91.1 fL (ref 80.0–100.0)
Platelets: 113 10*3/uL — ABNORMAL LOW (ref 150–400)
RBC: 3.93 MIL/uL — ABNORMAL LOW (ref 4.22–5.81)
RDW: 15.8 % — ABNORMAL HIGH (ref 11.5–15.5)
WBC: 4.8 10*3/uL (ref 4.0–10.5)
nRBC: 0 % (ref 0.0–0.2)

## 2020-06-10 MED ORDER — METOPROLOL TARTRATE 25 MG PO TABS: 12.5000 mg | ORAL_TABLET | Freq: Two times a day (BID) | ORAL | 1 refills | Status: DC

## 2020-06-10 NOTE — H&P (View-Only) (Signed)
Primary Care Physician: [name], MD Primary Cardiologist: Dr Debara Pickett Primary Electrophysiologist: none Referring Physician: Dr Rebecca Eaton is a 80 y.o. male with a history of coronary disease, hypertension, dyslipidemia, sleep apnea on CPAP, atrial fibrillation, chronic diastolic dysfunction, and h/o GI bleeding who presents for follow up in the [facility].  Patient is on Eliquis for a CHADS2VASC score of 4. He was hospitalized 02/2020 with symptomatic anemia with presenting Hgb 7.9, hemoccult positive. EGD showed no significant source of bleeding. He was seen by Dr Burt Knack 04/29/20 for Watchman consideration. CT 05/07/20 showed small LAA with broccoli morphology suitable for 20 mm Watchman FLX. He does report postural dizziness on standing which has been chronic.   Today, he denies symptoms of palpitations, chest pain, shortness of breath, orthopnea, PND, lower extremity edema, presyncope, syncope, , bleeding, or neurologic sequela. The patient is tolerating medications without difficulties and is otherwise without complaint today.    Atrial Fibrillation Risk Factors:  he does have symptoms or diagnosis of sleep apnea. he is compliant with CPAP therapy. he does not have a history of rheumatic fever.   he has a BMI of Body mass index is 34.58 kg/m.Marland Kitchen Filed Weights   06/10/20 1032  Weight: 103.1 kg    Family History  Problem Relation Age of Onset  . Heart failure Mother        also arthritis  . Stroke Father        also emphysema  . Heart failure Father      Atrial Fibrillation Management history:  Previous antiarrhythmic drugs: none Previous cardioversions: 01/2018 Previous ablations: none CHADS2VASC score: 4 Anticoagulation history: Eliquis   Past Medical History:  Diagnosis Date  . Anginal pain ([place])   . Anxiety   . Arthritis    generalized arthritis   . Atrial fibrillation ([place])   . CAD (coronary artery disease)   .  Cancer ([place])    skin cancer removed left ear    . Complication of anesthesia   . Depression   . Headache   . History of echocardiogram 02/19/2009   EF 50-55%; LA mild-mod dilated;   . History of kidney stones   . History of nuclear stress test 02/19/2009   low risk, normal   . Hyperlipidemia   . Hypertension   . Obesity (BMI 30-39.9)   . OSA (obstructive sleep apnea)    severe with AHI 25/hr now on BiPAP  . PONV (postoperative nausea and vomiting)   . Retinal detachment    with proliferative vitreoretinopathy  . Shortness of breath    related to weight   . Sleep apnea   . Vertigo    Past Surgical History:  Procedure Laterality Date  . APPENDECTOMY    . BIOPSY  03/05/2020   Procedure: BIOPSY;  Surgeon: Rogene Houston, MD;  [facility];  Service: Endoscopy;;  . CARDIAC CATHETERIZATION  07/13/2005   noncritical CAD  . CARDIOVERSION N/A 01/29/2018   Procedure: CARDIOVERSION;  Surgeon: Dorothy Spark, MD;  Location: [facility];  Service: Cardiovascular;  Laterality: N/A;  . COLONOSCOPY WITH PROPOFOL N/A 03/06/2020   Procedure: COLONOSCOPY WITH PROPOFOL;  Surgeon: Harvel Quale, MD;  [facility];  Service: Gastroenterology;  Laterality: N/A;  . ESOPHAGOGASTRODUODENOSCOPY (EGD) WITH PROPOFOL N/A 03/05/2020   Procedure: ESOPHAGOGASTRODUODENOSCOPY (EGD) WITH PROPOFOL;  Surgeon: Rogene Houston, MD;  [facility];  Service: Endoscopy;  Laterality: N/A;  . EYE  SURGERY    . FLEXIBLE SIGMOIDOSCOPY N/A 07/14/2015   Procedure: FLEXIBLE SIGMOIDOSCOPY;  Surgeon: Aviva Signs, MD;  [facility];  Service: Gastroenterology;  Laterality: N/A;  . GIVENS CAPSULE STUDY N/A 04/15/2020   Procedure: GIVENS CAPSULE STUDY;  Surgeon: Harvel Quale, MD;  [facility];  Service: Gastroenterology;  Laterality: N/A;  730  . JOINT REPLACEMENT    . NASAL SEPTOPLASTY W/ TURBINOPLASTY Bilateral 12/19/2017   Procedure: NASAL  SEPTOPLASTY WITH TURBINATE REDUCTION;  Surgeon: [name], MD;  Location: [facility];  Service: ENT;  Laterality: Bilateral;  . NASAL SINUS SURGERY    . PARS PLANA VITRECTOMY Left 06/20/2017   Procedure: PARS PLANA VITRECTOMY WITH 25 GAUGE, REPAIR OF COMPLEX TRACTION RETINAL DETACHMENT, MEMBRANE PEEEL;  Surgeon: [name], MD;  Location: [facility];  Service: Ophthalmology;  Laterality: Left;  . POLYPECTOMY  03/06/2020   Procedure: POLYPECTOMY;  Surgeon: Harvel Quale, MD;  [facility];  Service: Gastroenterology;;  . REPAIR OF COMPLEX TRACTION RETINAL DETACHMENT Left 06/20/2017  . RETINAL DETACHMENT SURGERY Right 04/2011  . SCLERAL BUCKLE WITH POSSIBLE 25 GAUGE PARS PLANA VITRECTOMY Left 05/22/2017   Procedure: SCLERAL BUCKLE, LASER, GAS INJECTION LEFT EYE;  Surgeon: [name], MD;  Location: [facility];  Service: Ophthalmology;  Laterality: Left;  . SKIN CANCER EXCISION Left 2013   left ear skin ca removed  2 weeks ago - not completeely healed 08/15/11   . TONSILLECTOMY    . TOTAL KNEE ARTHROPLASTY  08/22/2011   Procedure: TOTAL KNEE ARTHROPLASTY;  Surgeon: Gearlean Alf, MD;  [facility];  Service: Orthopedics;  Laterality: Right;    Current Outpatient Medications  Medication Sig Dispense Refill  . ALPRAZolam (XANAX) 0.5 MG tablet Take 0.5 mg at bedtime by mouth. Anxiety     . Ascorbic Acid (VITAMIN C) 1000 MG tablet Take 1,000 mg by mouth daily.    . brimonidine (ALPHAGAN) 0.15 % ophthalmic solution Place 1 drop into the left eye 2 (two) times daily.  3  . Calcium Polycarbophil (FIBER-CAPS PO) Take by mouth.    . Cholecalciferol (VITAMIN D3) 50 MCG (2000 UT) TABS Take by mouth.    Arne Cleveland 5 MG TABS tablet TAKE (1) TABLET TWICE DAILY. 180 tablet 1  . finasteride (PROSCAR) 5 MG tablet Take 1 tablet (5 mg total) by mouth daily with supper. 90 tablet 3  . furosemide (LASIX) 40 MG tablet Take 1 tablet (40 mg total) by mouth daily. 30 tablet 6   . Iron-FA-B Cmp-C-Biot-Probiotic (FUSION PLUS) CAPS Take by mouth daily.     Marland Kitchen lisinopril (ZESTRIL) 5 MG tablet Take 5 mg by mouth daily.    . metoprolol tartrate (LOPRESSOR) 25 MG tablet Take 1 tablet (25 mg total) by mouth 2 (two) times daily. 60 tablet 1  . Misc Natural Products (TART CHERRY ADVANCED PO) Take 1 tablet by mouth daily.     . NON FORMULARY at bedtime. VPAP    . potassium chloride (KLOR-CON) 10 MEQ tablet Take 10 mEq by mouth daily.    . psyllium (REGULOID) 0.52 g capsule Take 0.52 g by mouth daily.    Marland Kitchen senna-docusate (SENOKOT-S) 8.6-50 MG tablet Take 1 tablet by mouth daily.    . simvastatin (ZOCOR) 40 MG tablet Take 40 mg by mouth at bedtime.    . tamsulosin (FLOMAX) 0.4 MG CAPS capsule Take 1 capsule (0.4 mg total) by mouth in the morning and at bedtime. Take 1  capsule (0.4 mg) by mouth daily with supper (~1830) & take 1 capsule (0.4 mg) by mouth at bedtime. 180 capsule 3  . Zinc 50 MG TABS Take by mouth.     No current facility-administered medications for this encounter.    No Known Allergies  Social History   Socioeconomic History  . Marital status: Married    Spouse name: Not on file  . Number of children: 1  . Years of education: Not on file  . Highest education level: Not on file  Occupational History  . Not on file  Tobacco Use  . Smoking status: Never Smoker  . Smokeless tobacco: Never Used  Vaping Use  . Vaping Use: Never used  Substance and Sexual Activity  . Alcohol use: No  . Drug use: No  . Sexual activity: Not on file  Other Topics Concern  . Not on file  Social History Narrative  . Not on file   Social Determinants of Health   Financial Resource Strain: Not on file  Food Insecurity: Not on file  Transportation Needs: Not on file  Physical Activity: Not on file  Stress: Not on file  Social Connections: Not on file  Intimate Partner Violence: Not on file     ROS- All systems are reviewed and negative except as per the HPI  above.  Physical Exam: Vitals:   06/10/20 1032  BP: 102/60  Pulse: (!) 46  Weight: 103.1 kg  Height: 5\' 8"  (1.727 m)    GEN- The patient is well appearing obese elderly male, alert and oriented x 3 today.   Head- normocephalic, atraumatic Eyes-  Sclera clear, conjunctiva pink Ears- hearing intact Oropharynx- clear Neck- supple  Lungs- Clear to ausculation bilaterally, normal work of breathing Heart- irregular rate and rhythm, bradycardia, no murmurs, rubs or gallops  GI- soft, NT, ND, + BS Extremities- no clubbing, cyanosis, or edema MS- no significant deformity or atrophy Skin- no rash or lesion Psych- euthymic mood, full affect Neuro- strength and sensation are intact  Wt Readings from Last 3 Encounters:  06/10/20 103.1 kg  04/29/20 102.6 kg  04/09/20 102.2 kg    EKG today demonstrates  Afib  Vent. rate 46 BPM QRS duration 90 ms QT/QTc 398/348 ms  Echo 03/04/20 demonstrated  1. Left ventricular ejection fraction, by estimation, is 60 to 65%. The  left ventricle has normal function. The left ventricle has no regional  wall motion abnormalities. Left ventricular diastolic parameters are  indeterminate.  2. Right ventricular systolic function is normal. The right ventricular  size is normal. There is normal pulmonary artery systolic pressure. The  estimated right ventricular systolic pressure is [id number] mmHg.  3. Left atrial size was moderately dilated.  4. The mitral valve is abnormal, mildly thickened and calcified. Mild  mitral valve regurgitation.  5. The aortic valve is tricuspid. There is moderate calcification of the  aortic valve. Aortic valve regurgitation is mild. Mild to moderate aortic  valve stenosis. Aortic regurgitation PHT measures 725 msec. Aortic valve  mean gradient measures 11.3 mmHg.  Aortic valve Vmax measures 2.29 m/s.  6. The inferior vena cava is dilated in size with >50% respiratory  variability, suggesting right atrial pressure of 8  mmHg.   Epic records are reviewed at length today  CHA2DS2-VASc Score = 4  The patient's score is based upon: CHF History: No HTN History: Yes Diabetes History: No Stroke History: No Vascular Disease History: Yes Age Score: 2 Gender Score: 0  ASSESSMENT AND PLAN: 1. Persistent Atrial Fibrillation (ICD10:  I48.19) The patient's CHA2DS2-VASc score is 4, indicating a 4.8% annual risk of stroke.   I have seen AMEAR STROJNY is a 80 y.o. male in the office today who has been referred by Dr Rennis Golden for a Watchman left atrial appendage closure device.  He has a history of persistent atrial fibrillation.  This patient's CHA2DS2-VASc Score and unadjusted Ischemic Stroke Rate (% per year) is equal to 4.8 % stroke rate/year from a score of 4 which necessitates long term oral anticoagulation to prevent stroke. HasBled score is 4.  Unfortunately, He is not felt to be a long term anticoagulation candidate secondary to GI bleeding of unknown source. Patient felt to be a candidate for short term anticoagulation. Procedural risks for the Watchman implant were again reviewed with the patient including a 1% risk of stroke, 2% risk of perforation, 0.1% risk of device embolization.  He is scheduled for Watchman implant with Dr Excell Seltzer on 06/25/20.  Will check Bmet/CBC today. CXR 03/03/20 reviewed.  Continue Eliquis 5 mg BID. Hold morning of procedure. Will discuss starting ASA with Dr Excell Seltzer given his h/o GI bleeding. Decrease metoprolol to 12.5 mg BID given bradycardia and dizziness.   2. Secondary Hypercoagulable State (ICD10:  D68.69) The patient is at significant risk for stroke/thromboembolism based upon his CHA2DS2-VASc Score of 4.  Continue Apixaban (Eliquis) for now. Plans for Watchman as above.  3. Obesity Body mass index is 34.58 kg/m. Lifestyle modification was discussed at length including regular exercise and weight reduction.  4. Obstructive sleep apnea The importance of adequate  treatment of sleep apnea was discussed today in order to improve our ability to maintain sinus rhythm long term. Encouraged compliance with CPAP therapy.  5. HTN Borderline low today, will decrease BB as above.  6. Bradycardia Patient reports his HR is mostly in the 40's at home. Unclear if this is contributing to his dizziness. Will try lowering BB as above.    Follow up 06/25/20 for Watchman implant.   Jorja Loa PA-C [facility] [address] [place], [place] [address] [phone] 06/10/2020 10:49 AM

## 2020-06-10 NOTE — Progress Notes (Signed)
  Primary Care Physician: Golding, John, MD Primary Cardiologist: Dr Hilty Primary Electrophysiologist: none Referring Physician: Dr Teoh   Paul Lam is a 79 y.o. male with a history of coronary disease, hypertension, dyslipidemia, sleep apnea on CPAP, atrial fibrillation, chronic diastolic dysfunction, and h/o GI bleeding who presents for follow up in the [facility].  Patient is on Eliquis for a CHADS2VASC score of 4. He was hospitalized 02/2020 with symptomatic anemia with presenting Hgb 7.9, hemoccult positive. EGD showed no significant source of bleeding. He was seen by Dr Cooper 04/29/20 for Watchman consideration. CT 05/07/20 showed small LAA with broccoli morphology suitable for 20 mm Watchman FLX. He does report postural dizziness on standing which has been chronic.   Today, he denies symptoms of palpitations, chest pain, shortness of breath, orthopnea, PND, lower extremity edema, presyncope, syncope, , bleeding, or neurologic sequela. The patient is tolerating medications without difficulties and is otherwise without complaint today.    Atrial Fibrillation Risk Factors:  he does have symptoms or diagnosis of sleep apnea. he is compliant with CPAP therapy. he does not have a history of rheumatic fever.   he has a BMI of Body mass index is 34.58 kg/m.. Filed Weights   06/10/20 1032  Weight: 103.1 kg    Family History  Problem Relation Age of Onset  . Heart failure Mother        also arthritis  . Stroke Father        also emphysema  . Heart failure Father      Atrial Fibrillation Management history:  Previous antiarrhythmic drugs: none Previous cardioversions: 01/2018 Previous ablations: none CHADS2VASC score: 4 Anticoagulation history: Eliquis   Past Medical History:  Diagnosis Date  . Anginal pain (HCC)   . Anxiety   . Arthritis    generalized arthritis   . Atrial fibrillation (HCC)   . CAD (coronary artery disease)   .  Cancer (HCC)    skin cancer removed left ear    . Complication of anesthesia   . Depression   . Headache   . History of echocardiogram 02/19/2009   EF 50-55%; LA mild-mod dilated;   . History of kidney stones   . History of nuclear stress test 02/19/2009   low risk, normal   . Hyperlipidemia   . Hypertension   . Obesity (BMI 30-39.9)   . OSA (obstructive sleep apnea)    severe with AHI 25/hr now on BiPAP  . PONV (postoperative nausea and vomiting)   . Retinal detachment    with proliferative vitreoretinopathy  . Shortness of breath    related to weight   . Sleep apnea   . Vertigo    Past Surgical History:  Procedure Laterality Date  . APPENDECTOMY    . BIOPSY  03/05/2020   Procedure: BIOPSY;  Surgeon: Rehman, Najeeb U, MD;  [facility];  Service: Endoscopy;;  . CARDIAC CATHETERIZATION  07/13/2005   noncritical CAD  . CARDIOVERSION N/A 01/29/2018   Procedure: CARDIOVERSION;  Surgeon: Nelson, Katarina H, MD;  [facility];  Service: Cardiovascular;  Laterality: N/A;  . COLONOSCOPY WITH PROPOFOL N/A 03/06/2020   Procedure: COLONOSCOPY WITH PROPOFOL;  Surgeon: Castaneda Mayorga, Daniel, MD;  [facility]: [facility];  Service: Gastroenterology;  Laterality: N/A;  . ESOPHAGOGASTRODUODENOSCOPY (EGD) WITH PROPOFOL N/A 03/05/2020   Procedure: ESOPHAGOGASTRODUODENOSCOPY (EGD) WITH PROPOFOL;  Surgeon: Rehman, Najeeb U, MD;  [facility];  Service: Endoscopy;  Laterality: N/A;  . EYE   SURGERY    . FLEXIBLE SIGMOIDOSCOPY N/A 07/14/2015   Procedure: FLEXIBLE SIGMOIDOSCOPY;  Surgeon: Aviva Signs, MD;  [facility];  Service: Gastroenterology;  Laterality: N/A;  . GIVENS CAPSULE STUDY N/A 04/15/2020   Procedure: GIVENS CAPSULE STUDY;  Surgeon: Harvel Quale, MD;  [facility];  Service: Gastroenterology;  Laterality: N/A;  730  . JOINT REPLACEMENT    . NASAL SEPTOPLASTY W/ TURBINOPLASTY Bilateral 12/19/2017   Procedure: NASAL  SEPTOPLASTY WITH TURBINATE REDUCTION;  Surgeon: [name], MD;  Location: [place];  Service: ENT;  Laterality: Bilateral;  . NASAL SINUS SURGERY    . PARS PLANA VITRECTOMY Left 06/20/2017   Procedure: PARS PLANA VITRECTOMY WITH 25 GAUGE, REPAIR OF COMPLEX TRACTION RETINAL DETACHMENT, MEMBRANE PEEEL;  Surgeon: [name], MD;  Location: [place];  Service: Ophthalmology;  Laterality: Left;  . POLYPECTOMY  03/06/2020   Procedure: POLYPECTOMY;  Surgeon: Harvel Quale, MD;  [facility];  Service: Gastroenterology;;  . REPAIR OF COMPLEX TRACTION RETINAL DETACHMENT Left 06/20/2017  . RETINAL DETACHMENT SURGERY Right 04/2011  . SCLERAL BUCKLE WITH POSSIBLE 25 GAUGE PARS PLANA VITRECTOMY Left 05/22/2017   Procedure: SCLERAL BUCKLE, LASER, GAS INJECTION LEFT EYE;  Surgeon: [name], MD;  Location: [place];  Service: Ophthalmology;  Laterality: Left;  . SKIN CANCER EXCISION Left 2013   left ear skin ca removed  2 weeks ago - not completeely healed 08/15/11   . TONSILLECTOMY    . TOTAL KNEE ARTHROPLASTY  08/22/2011   Procedure: TOTAL KNEE ARTHROPLASTY;  Surgeon: Gearlean Alf, MD;  Location: [facility];  Service: Orthopedics;  Laterality: Right;    Current Outpatient Medications  Medication Sig Dispense Refill  . ALPRAZolam (XANAX) 0.5 MG tablet Take 0.5 mg at bedtime by mouth. Anxiety     . Ascorbic Acid (VITAMIN C) 1000 MG tablet Take 1,000 mg by mouth daily.    . brimonidine (ALPHAGAN) 0.15 % ophthalmic solution Place 1 drop into the left eye 2 (two) times daily.  3  . Calcium Polycarbophil (FIBER-CAPS PO) Take by mouth.    . Cholecalciferol (VITAMIN D3) 50 MCG (2000 UT) TABS Take by mouth.    Arne Cleveland 5 MG TABS tablet TAKE (1) TABLET TWICE DAILY. 180 tablet 1  . finasteride (PROSCAR) 5 MG tablet Take 1 tablet (5 mg total) by mouth daily with supper. 90 tablet 3  . furosemide (LASIX) 40 MG tablet Take 1 tablet (40 mg total) by mouth daily. 30 tablet 6   . Iron-FA-B Cmp-C-Biot-Probiotic (FUSION PLUS) CAPS Take by mouth daily.     Marland Kitchen lisinopril (ZESTRIL) 5 MG tablet Take 5 mg by mouth daily.    . metoprolol tartrate (LOPRESSOR) 25 MG tablet Take 1 tablet (25 mg total) by mouth 2 (two) times daily. 60 tablet 1  . Misc Natural Products (TART CHERRY ADVANCED PO) Take 1 tablet by mouth daily.     . NON FORMULARY at bedtime. VPAP    . potassium chloride (KLOR-CON) 10 MEQ tablet Take 10 mEq by mouth daily.    . psyllium (REGULOID) 0.52 g capsule Take 0.52 g by mouth daily.    Marland Kitchen senna-docusate (SENOKOT-S) 8.6-50 MG tablet Take 1 tablet by mouth daily.    . simvastatin (ZOCOR) 40 MG tablet Take 40 mg by mouth at bedtime.    . tamsulosin (FLOMAX) 0.4 MG CAPS capsule Take 1 capsule (0.4 mg total) by mouth in the morning and at bedtime. Take 1  capsule (0.4 mg) by mouth daily with supper (~1830) & take 1 capsule (0.4 mg) by mouth at bedtime. 180 capsule 3  . Zinc 50 MG TABS Take by mouth.     No current facility-administered medications for this encounter.    No Known Allergies  Social History   Socioeconomic History  . Marital status: Married    Spouse name: Not on file  . Number of children: 1  . Years of education: Not on file  . Highest education level: Not on file  Occupational History  . Not on file  Tobacco Use  . Smoking status: Never Smoker  . Smokeless tobacco: Never Used  Vaping Use  . Vaping Use: Never used  Substance and Sexual Activity  . Alcohol use: No  . Drug use: No  . Sexual activity: Not on file  Other Topics Concern  . Not on file  Social History Narrative  . Not on file   Social Determinants of Health   Financial Resource Strain: Not on file  Food Insecurity: Not on file  Transportation Needs: Not on file  Physical Activity: Not on file  Stress: Not on file  Social Connections: Not on file  Intimate Partner Violence: Not on file     ROS- All systems are reviewed and negative except as per the HPI  above.  Physical Exam: Vitals:   06/10/20 1032  BP: 102/60  Pulse: (!) 46  Weight: 103.1 kg  Height: 5\' 8"  (1.727 m)    GEN- The patient is well appearing obese elderly male, alert and oriented x 3 today.   Head- normocephalic, atraumatic Eyes-  Sclera clear, conjunctiva pink Ears- hearing intact Oropharynx- clear Neck- supple  Lungs- Clear to ausculation bilaterally, normal work of breathing Heart- irregular rate and rhythm, bradycardia, no murmurs, rubs or gallops  GI- soft, NT, ND, + BS Extremities- no clubbing, cyanosis, or edema MS- no significant deformity or atrophy Skin- no rash or lesion Psych- euthymic mood, full affect Neuro- strength and sensation are intact  Wt Readings from Last 3 Encounters:  06/10/20 103.1 kg  04/29/20 102.6 kg  04/09/20 102.2 kg    EKG today demonstrates  Afib  Vent. rate 46 BPM QRS duration 90 ms QT/QTc 398/348 ms  Echo 03/04/20 demonstrated  1. Left ventricular ejection fraction, by estimation, is 60 to 65%. The  left ventricle has normal function. The left ventricle has no regional  wall motion abnormalities. Left ventricular diastolic parameters are  indeterminate.  2. Right ventricular systolic function is normal. The right ventricular  size is normal. There is normal pulmonary artery systolic pressure. The  estimated right ventricular systolic pressure is [id number] mmHg.  3. Left atrial size was moderately dilated.  4. The mitral valve is abnormal, mildly thickened and calcified. Mild  mitral valve regurgitation.  5. The aortic valve is tricuspid. There is moderate calcification of the  aortic valve. Aortic valve regurgitation is mild. Mild to moderate aortic  valve stenosis. Aortic regurgitation PHT measures 725 msec. Aortic valve  mean gradient measures 11.3 mmHg.  Aortic valve Vmax measures 2.29 m/s.  6. The inferior vena cava is dilated in size with >50% respiratory  variability, suggesting right atrial pressure of 8  mmHg.   Epic records are reviewed at length today  CHA2DS2-VASc Score = 4  The patient's score is based upon: CHF History: No HTN History: Yes Diabetes History: No Stroke History: No Vascular Disease History: Yes Age Score: 2 Gender Score: 0  ASSESSMENT AND PLAN: 1. Persistent Atrial Fibrillation (ICD10:  I48.19) The patient's CHA2DS2-VASc score is 4, indicating a 4.8% annual risk of stroke.   I have seen AMEAR STROJNY is a 80 y.o. male in the office today who has been referred by Dr Rennis Golden for a Watchman left atrial appendage closure device.  He has a history of persistent atrial fibrillation.  This patient's CHA2DS2-VASc Score and unadjusted Ischemic Stroke Rate (% per year) is equal to 4.8 % stroke rate/year from a score of 4 which necessitates long term oral anticoagulation to prevent stroke. HasBled score is 4.  Unfortunately, He is not felt to be a long term anticoagulation candidate secondary to GI bleeding of unknown source. Patient felt to be a candidate for short term anticoagulation. Procedural risks for the Watchman implant were again reviewed with the patient including a 1% risk of stroke, 2% risk of perforation, 0.1% risk of device embolization.  He is scheduled for Watchman implant with Dr Excell Seltzer on 06/25/20.  Will check Bmet/CBC today. CXR 03/03/20 reviewed.  Continue Eliquis 5 mg BID. Hold morning of procedure. Will discuss starting ASA with Dr Excell Seltzer given his h/o GI bleeding. Decrease metoprolol to 12.5 mg BID given bradycardia and dizziness.   2. Secondary Hypercoagulable State (ICD10:  D68.69) The patient is at significant risk for stroke/thromboembolism based upon his CHA2DS2-VASc Score of 4.  Continue Apixaban (Eliquis) for now. Plans for Watchman as above.  3. Obesity Body mass index is 34.58 kg/m. Lifestyle modification was discussed at length including regular exercise and weight reduction.  4. Obstructive sleep apnea The importance of adequate  treatment of sleep apnea was discussed today in order to improve our ability to maintain sinus rhythm long term. Encouraged compliance with CPAP therapy.  5. HTN Borderline low today, will decrease BB as above.  6. Bradycardia Patient reports his HR is mostly in the 40's at home. Unclear if this is contributing to his dizziness. Will try lowering BB as above.    Follow up 06/25/20 for Watchman implant.   Jorja Loa PA-C [facility] [address] [place], [place] [address] [phone] 06/10/2020 10:49 AM

## 2020-06-10 NOTE — Patient Instructions (Addendum)
Decrease metoprolol to 12.5mg  twice a day (1/2 tablet twice a day)_    LEFT ATRIAL APPENDAGE CLOSURE INSTRUCTIONS:  You are scheduled for a Left Atrial Appendage Device Implantation on Thursday, January 20th  1. Please arrive at the [facility] (Main Entrance A) at [facility]: [address] [place], [place] [address] at 9:30AM   (This time is two hours before your procedure to ensure your preparation). Free valet parking service is available. You are allowed ONE visitor in the waiting room during your procedure. Both you and your guest must wear masks. Special note: Every effort is made to have your procedure done on time. Please understand that emergencies sometimes delay scheduled procedures.  2.   Do not eat or drink after midnight prior to your procedure.     3.   Medication Instructions: Take ONLY the listed medications morning of procedure with a sip of water:  Lisinopril, metoprolol     4. Plan for one night stay--bring personal belongings. When you are discharged, you will need someone to drive you home and stay with you for 24 hours.  5. Bring a current list of your medications and current insurance cards.  6. Please wear clothes that are easy to get on and off and wear slip-on shoes.  7. Robin Searing, the [facility], will arrange follow-up for you during your hospital stay and you will be discharged with your appointment dates/times. His direct number is [phone] if you need assistance.  **If you have any questions, do not hesitate to call the office! You will also be contacted the week of your procedure to confirm instructions.**

## 2020-06-12 ENCOUNTER — Telehealth (HOSPITAL_COMMUNITY): Payer: Self-pay | Admitting: *Deleted

## 2020-06-12 DIAGNOSIS — M7751 Other enthesopathy of right foot: Secondary | ICD-10-CM | POA: Diagnosis not present

## 2020-06-12 DIAGNOSIS — M79671 Pain in right foot: Secondary | ICD-10-CM | POA: Diagnosis not present

## 2020-06-12 NOTE — Telephone Encounter (Signed)
Per discussion with Dr. Burt Knack patient should not start aspirin for upcoming watchman procedure- pt notified.

## 2020-06-15 ENCOUNTER — Telehealth (HOSPITAL_COMMUNITY): Payer: Self-pay | Admitting: Nurse Practitioner

## 2020-06-15 NOTE — Telephone Encounter (Signed)
Call placed this morning to advise Paul Lam of a change to his Watchman implant date. I was able to speak with his wife [name] and I informed her of his new procedure date of 1/27 and that he will also have his Covid screening on 1/25 instead of 1/18. I also provided my contact information to her and advised that all dates are tentative and may change based on our current Covid situation. She expressed a full understanding and thanked me for the call this morning.     Ambrose Pancoast, RN Watchman Coordinator

## 2020-06-23 ENCOUNTER — Inpatient Hospital Stay (HOSPITAL_COMMUNITY): Admission: RE | Admit: 2020-06-23 | Payer: Medicare Other | Source: Ambulatory Visit

## 2020-06-23 DIAGNOSIS — J019 Acute sinusitis, unspecified: Secondary | ICD-10-CM | POA: Diagnosis not present

## 2020-06-23 DIAGNOSIS — Z681 Body mass index (BMI) 19 or less, adult: Secondary | ICD-10-CM | POA: Diagnosis not present

## 2020-06-23 DIAGNOSIS — F419 Anxiety disorder, unspecified: Secondary | ICD-10-CM | POA: Diagnosis not present

## 2020-06-24 ENCOUNTER — Telehealth: Payer: Self-pay | Admitting: Nurse Practitioner

## 2020-06-24 NOTE — Telephone Encounter (Signed)
Patient contacted this morning and advised of new procedure date reschedule from 1/27 to 2/3 and new COVID screening date of 2/1. I advised patient to call me with any question regarding the change in procedure date or anything relating to upcoming procedure.  Ambrose Pancoast, RN Watchman Coordinator

## 2020-06-30 ENCOUNTER — Other Ambulatory Visit (HOSPITAL_COMMUNITY): Payer: Medicare Other

## 2020-06-30 DIAGNOSIS — L110 Acquired keratosis follicularis: Secondary | ICD-10-CM | POA: Diagnosis not present

## 2020-06-30 DIAGNOSIS — I739 Peripheral vascular disease, unspecified: Secondary | ICD-10-CM | POA: Diagnosis not present

## 2020-06-30 DIAGNOSIS — B351 Tinea unguium: Secondary | ICD-10-CM | POA: Diagnosis not present

## 2020-07-03 DIAGNOSIS — G4733 Obstructive sleep apnea (adult) (pediatric): Secondary | ICD-10-CM | POA: Diagnosis not present

## 2020-07-04 DIAGNOSIS — I4891 Unspecified atrial fibrillation: Secondary | ICD-10-CM | POA: Diagnosis not present

## 2020-07-04 DIAGNOSIS — E782 Mixed hyperlipidemia: Secondary | ICD-10-CM | POA: Diagnosis not present

## 2020-07-04 DIAGNOSIS — E6609 Other obesity due to excess calories: Secondary | ICD-10-CM | POA: Diagnosis not present

## 2020-07-04 DIAGNOSIS — Z6834 Body mass index (BMI) 34.0-34.9, adult: Secondary | ICD-10-CM | POA: Diagnosis not present

## 2020-07-07 ENCOUNTER — Other Ambulatory Visit (HOSPITAL_COMMUNITY)
Admission: RE | Admit: 2020-07-07 | Discharge: 2020-07-07 | Disposition: A | Discharge status: Home / Self Care | Payer: Medicare Other | Source: Ambulatory Visit | Attending: Cardiovascular Disease | Admitting: Cardiovascular Disease

## 2020-07-07 DIAGNOSIS — Z01812 Encounter for preprocedural laboratory examination: Secondary | ICD-10-CM | POA: Insufficient documentation

## 2020-07-07 DIAGNOSIS — E785 Hyperlipidemia, unspecified: Secondary | ICD-10-CM | POA: Diagnosis not present

## 2020-07-07 DIAGNOSIS — Z8249 Family history of ischemic heart disease and other diseases of the circulatory system: Secondary | ICD-10-CM | POA: Diagnosis not present

## 2020-07-07 DIAGNOSIS — D696 Thrombocytopenia, unspecified: Secondary | ICD-10-CM | POA: Diagnosis not present

## 2020-07-07 DIAGNOSIS — Z7901 Long term (current) use of anticoagulants: Secondary | ICD-10-CM | POA: Diagnosis not present

## 2020-07-07 DIAGNOSIS — Z823 Family history of stroke: Secondary | ICD-10-CM | POA: Diagnosis not present

## 2020-07-07 DIAGNOSIS — Z006 Encounter for examination for normal comparison and control in clinical research program: Secondary | ICD-10-CM | POA: Diagnosis not present

## 2020-07-07 DIAGNOSIS — E669 Obesity, unspecified: Secondary | ICD-10-CM | POA: Diagnosis not present

## 2020-07-07 DIAGNOSIS — Z96651 Presence of right artificial knee joint: Secondary | ICD-10-CM | POA: Diagnosis not present

## 2020-07-07 DIAGNOSIS — I4819 Other persistent atrial fibrillation: Secondary | ICD-10-CM | POA: Diagnosis not present

## 2020-07-07 DIAGNOSIS — I5032 Chronic diastolic (congestive) heart failure: Secondary | ICD-10-CM | POA: Diagnosis not present

## 2020-07-07 DIAGNOSIS — Z20822 Contact with and (suspected) exposure to covid-19: Secondary | ICD-10-CM | POA: Insufficient documentation

## 2020-07-07 DIAGNOSIS — I110 Hypertensive heart disease with heart failure: Secondary | ICD-10-CM | POA: Diagnosis not present

## 2020-07-07 DIAGNOSIS — Z79899 Other long term (current) drug therapy: Secondary | ICD-10-CM | POA: Diagnosis not present

## 2020-07-07 DIAGNOSIS — I4811 Longstanding persistent atrial fibrillation: Secondary | ICD-10-CM | POA: Diagnosis not present

## 2020-07-07 DIAGNOSIS — G4733 Obstructive sleep apnea (adult) (pediatric): Secondary | ICD-10-CM | POA: Diagnosis not present

## 2020-07-07 DIAGNOSIS — Z6834 Body mass index (BMI) 34.0-34.9, adult: Secondary | ICD-10-CM | POA: Diagnosis not present

## 2020-07-07 DIAGNOSIS — I5031 Acute diastolic (congestive) heart failure: Secondary | ICD-10-CM | POA: Diagnosis not present

## 2020-07-07 DIAGNOSIS — F419 Anxiety disorder, unspecified: Secondary | ICD-10-CM | POA: Diagnosis not present

## 2020-07-07 DIAGNOSIS — Z825 Family history of asthma and other chronic lower respiratory diseases: Secondary | ICD-10-CM | POA: Diagnosis not present

## 2020-07-07 DIAGNOSIS — I2510 Atherosclerotic heart disease of native coronary artery without angina pectoris: Secondary | ICD-10-CM | POA: Diagnosis not present

## 2020-07-07 DIAGNOSIS — D6869 Other thrombophilia: Secondary | ICD-10-CM | POA: Diagnosis not present

## 2020-07-07 LAB — SARS CORONAVIRUS 2 (TAT 6-24 HRS): SARS Coronavirus 2: NEGATIVE

## 2020-07-08 ENCOUNTER — Telehealth: Payer: Self-pay | Admitting: Nurse Practitioner

## 2020-07-08 NOTE — Telephone Encounter (Signed)
Contacted Mr. Awan this morning and offered earlier procedure time for Watchman implant tomorrow. He declined and wishes to proceed with scheduled time.  Ambrose Pancoast, RN Watchman Coordinator

## 2020-07-09 ENCOUNTER — Other Ambulatory Visit: Payer: Self-pay

## 2020-07-09 ENCOUNTER — Inpatient Hospital Stay (HOSPITAL_COMMUNITY): Payer: Medicare Other | Admitting: Anesthesiology

## 2020-07-09 ENCOUNTER — Inpatient Hospital Stay (HOSPITAL_COMMUNITY)
Admission: RE | Admit: 2020-07-09 | Discharge: 2020-07-10 | DRG: 274 | Disposition: A | Discharge status: Home / Self Care | Payer: Medicare Other | Attending: Cardiovascular Disease | Admitting: Cardiovascular Disease

## 2020-07-09 ENCOUNTER — Inpatient Hospital Stay (HOSPITAL_COMMUNITY)
Admission: RE | Admit: 2020-07-09 | Discharge: 2020-07-09 | Disposition: A | Discharge status: Home / Self Care | Payer: Medicare Other | Source: Ambulatory Visit | Attending: Cardiovascular Disease | Admitting: Cardiovascular Disease

## 2020-07-09 ENCOUNTER — Encounter (HOSPITAL_COMMUNITY): Payer: Self-pay | Admitting: Cardiovascular Disease

## 2020-07-09 ENCOUNTER — Inpatient Hospital Stay (HOSPITAL_COMMUNITY)
Admission: RE | Disposition: A | Discharge status: Home / Self Care | Payer: Medicare Other | Source: Home / Self Care | Attending: Cardiovascular Disease

## 2020-07-09 DIAGNOSIS — I4819 Other persistent atrial fibrillation: Secondary | ICD-10-CM | POA: Diagnosis present

## 2020-07-09 DIAGNOSIS — Z79899 Other long term (current) drug therapy: Secondary | ICD-10-CM | POA: Diagnosis not present

## 2020-07-09 DIAGNOSIS — Z825 Family history of asthma and other chronic lower respiratory diseases: Secondary | ICD-10-CM | POA: Diagnosis not present

## 2020-07-09 DIAGNOSIS — I110 Hypertensive heart disease with heart failure: Secondary | ICD-10-CM | POA: Diagnosis present

## 2020-07-09 DIAGNOSIS — Z7901 Long term (current) use of anticoagulants: Secondary | ICD-10-CM

## 2020-07-09 DIAGNOSIS — D6869 Other thrombophilia: Secondary | ICD-10-CM | POA: Diagnosis present

## 2020-07-09 DIAGNOSIS — Z20822 Contact with and (suspected) exposure to covid-19: Secondary | ICD-10-CM | POA: Diagnosis present

## 2020-07-09 DIAGNOSIS — Z96651 Presence of right artificial knee joint: Secondary | ICD-10-CM | POA: Diagnosis present

## 2020-07-09 DIAGNOSIS — G4733 Obstructive sleep apnea (adult) (pediatric): Secondary | ICD-10-CM | POA: Diagnosis present

## 2020-07-09 DIAGNOSIS — Z006 Encounter for examination for normal comparison and control in clinical research program: Secondary | ICD-10-CM

## 2020-07-09 DIAGNOSIS — I2510 Atherosclerotic heart disease of native coronary artery without angina pectoris: Secondary | ICD-10-CM | POA: Diagnosis present

## 2020-07-09 DIAGNOSIS — Z8249 Family history of ischemic heart disease and other diseases of the circulatory system: Secondary | ICD-10-CM

## 2020-07-09 DIAGNOSIS — Z823 Family history of stroke: Secondary | ICD-10-CM

## 2020-07-09 DIAGNOSIS — I4811 Longstanding persistent atrial fibrillation: Secondary | ICD-10-CM

## 2020-07-09 DIAGNOSIS — F419 Anxiety disorder, unspecified: Secondary | ICD-10-CM | POA: Diagnosis present

## 2020-07-09 DIAGNOSIS — E785 Hyperlipidemia, unspecified: Secondary | ICD-10-CM | POA: Diagnosis present

## 2020-07-09 DIAGNOSIS — I5032 Chronic diastolic (congestive) heart failure: Secondary | ICD-10-CM | POA: Diagnosis present

## 2020-07-09 DIAGNOSIS — Z6834 Body mass index (BMI) 34.0-34.9, adult: Secondary | ICD-10-CM | POA: Diagnosis not present

## 2020-07-09 DIAGNOSIS — E669 Obesity, unspecified: Secondary | ICD-10-CM | POA: Diagnosis present

## 2020-07-09 HISTORY — PX: LEFT ATRIAL APPENDAGE OCCLUSION: EP1229

## 2020-07-09 HISTORY — PX: TEE WITHOUT CARDIOVERSION: SHX5443

## 2020-07-09 LAB — TYPE AND SCREEN
ABO/RH(D): O POS
Antibody Screen: NEGATIVE

## 2020-07-09 LAB — SURGICAL PCR SCREEN
MRSA, PCR: NEGATIVE
Staphylococcus aureus: NEGATIVE

## 2020-07-09 LAB — POCT ACTIVATED CLOTTING TIME: Activated Clotting Time: 350 seconds

## 2020-07-09 SURGERY — LEFT ATRIAL APPENDAGE OCCLUSION
Anesthesia: General

## 2020-07-09 MED ORDER — APIXABAN 5 MG PO TABS
5.0000 mg | ORAL_TABLET | Freq: Two times a day (BID) | ORAL | Status: DC
  Administered 2020-07-09 – 2020-07-10 (×2): 5 mg via ORAL
  Filled 2020-07-09 (×3): qty 1

## 2020-07-09 MED ORDER — SODIUM CHLORIDE 0.9% FLUSH
3.0000 mL | Freq: Two times a day (BID) | INTRAVENOUS | Status: DC
  Administered 2020-07-09: 3 mL via INTRAVENOUS

## 2020-07-09 MED ORDER — ARTIFICIAL TEARS OPHTHALMIC OINT
TOPICAL_OINTMENT | OPHTHALMIC | Status: DC | PRN
  Administered 2020-07-09: 1 via OPHTHALMIC

## 2020-07-09 MED ORDER — SODIUM CHLORIDE 0.9% FLUSH: 3.0000 mL | INTRAVENOUS | Status: DC | PRN

## 2020-07-09 MED ORDER — PROTAMINE SULFATE 10 MG/ML IV SOLN
INTRAVENOUS | Status: DC | PRN
  Administered 2020-07-09 (×2): 10 mg via INTRAVENOUS

## 2020-07-09 MED ORDER — SODIUM CHLORIDE 0.9 % IV SOLN: 250.0000 mL | INTRAVENOUS | Status: DC | PRN

## 2020-07-09 MED ORDER — ALPRAZOLAM 0.5 MG PO TABS
0.5000 mg | ORAL_TABLET | Freq: Every day | ORAL | Status: DC
  Administered 2020-07-09: 0.5 mg via ORAL
  Filled 2020-07-09: qty 2

## 2020-07-09 MED ORDER — EPHEDRINE SULFATE 50 MG/ML IJ SOLN
INTRAMUSCULAR | Status: DC | PRN
  Administered 2020-07-09: 10 mg via INTRAVENOUS

## 2020-07-09 MED ORDER — FERROUS SULFATE 325 (65 FE) MG PO TABS
325.0000 mg | ORAL_TABLET | Freq: Every day | ORAL | Status: DC
  Administered 2020-07-10: 325 mg via ORAL
  Filled 2020-07-09: qty 1

## 2020-07-09 MED ORDER — HEPARIN (PORCINE) IN NACL 2000-0.9 UNIT/L-% IV SOLN
INTRAVENOUS | Status: DC | PRN
  Administered 2020-07-09: 1000 mL

## 2020-07-09 MED ORDER — PROPOFOL 10 MG/ML IV BOLUS
INTRAVENOUS | Status: DC | PRN
  Administered 2020-07-09: 50 mg via INTRAVENOUS
  Administered 2020-07-09: 100 mg via INTRAVENOUS

## 2020-07-09 MED ORDER — HEPARIN SODIUM (PORCINE) 1000 UNIT/ML IJ SOLN
INTRAMUSCULAR | Status: DC | PRN
  Administered 2020-07-09: 15000 [IU] via INTRAVENOUS

## 2020-07-09 MED ORDER — HEPARIN (PORCINE) IN NACL 1000-0.9 UT/500ML-% IV SOLN
INTRAVENOUS | Status: DC | PRN
  Administered 2020-07-09: 500 mL

## 2020-07-09 MED ORDER — SODIUM CHLORIDE 0.9 % IV SOLN: INTRAVENOUS | Status: DC

## 2020-07-09 MED ORDER — FINASTERIDE 5 MG PO TABS
5.0000 mg | ORAL_TABLET | Freq: Every day | ORAL | Status: DC
  Administered 2020-07-09: 5 mg via ORAL
  Filled 2020-07-09: qty 1

## 2020-07-09 MED ORDER — PHENYLEPHRINE HCL (PRESSORS) 10 MG/ML IV SOLN
INTRAVENOUS | Status: DC | PRN
  Administered 2020-07-09: 40 ug via INTRAVENOUS
  Administered 2020-07-09: 80 ug via INTRAVENOUS
  Administered 2020-07-09: 40 ug via INTRAVENOUS
  Administered 2020-07-09: 80 ug via INTRAVENOUS

## 2020-07-09 MED ORDER — HEPARIN (PORCINE) IN NACL 2000-0.9 UNIT/L-% IV SOLN
INTRAVENOUS | Status: AC
  Filled 2020-07-09: qty 1000

## 2020-07-09 MED ORDER — HEPARIN (PORCINE) IN NACL 1000-0.9 UT/500ML-% IV SOLN
INTRAVENOUS | Status: AC
  Filled 2020-07-09: qty 500

## 2020-07-09 MED ORDER — FUROSEMIDE 40 MG PO TABS
40.0000 mg | ORAL_TABLET | Freq: Every day | ORAL | Status: DC
  Administered 2020-07-10: 40 mg via ORAL
  Filled 2020-07-09: qty 1

## 2020-07-09 MED ORDER — ONDANSETRON HCL 4 MG/2ML IJ SOLN: 4.0000 mg | Freq: Four times a day (QID) | INTRAMUSCULAR | Status: DC | PRN

## 2020-07-09 MED ORDER — BRIMONIDINE TARTRATE 0.15 % OP SOLN
1.0000 [drp] | Freq: Two times a day (BID) | OPHTHALMIC | Status: DC
  Administered 2020-07-09 – 2020-07-10 (×2): 1 [drp] via OPHTHALMIC
  Filled 2020-07-09: qty 5

## 2020-07-09 MED ORDER — ROCURONIUM BROMIDE 10 MG/ML (PF) SYRINGE
PREFILLED_SYRINGE | INTRAVENOUS | Status: DC | PRN
  Administered 2020-07-09: 60 mg via INTRAVENOUS

## 2020-07-09 MED ORDER — CEFAZOLIN SODIUM-DEXTROSE 2-4 GM/100ML-% IV SOLN
2.0000 g | INTRAVENOUS | Status: AC
  Administered 2020-07-09: 2 g via INTRAVENOUS
  Filled 2020-07-09: qty 100

## 2020-07-09 MED ORDER — LACTATED RINGERS IV SOLN: INTRAVENOUS | Status: DC | PRN

## 2020-07-09 MED ORDER — ACETAMINOPHEN 325 MG PO TABS: 650.0000 mg | ORAL_TABLET | ORAL | Status: DC | PRN

## 2020-07-09 MED ORDER — METOPROLOL TARTRATE 12.5 MG HALF TABLET
12.5000 mg | ORAL_TABLET | Freq: Two times a day (BID) | ORAL | Status: DC
  Administered 2020-07-09: 12.5 mg via ORAL
  Filled 2020-07-09 (×3): qty 1

## 2020-07-09 MED ORDER — CHLORHEXIDINE GLUCONATE 0.12 % MT SOLN
15.0000 mL | OROMUCOSAL | Status: AC
  Administered 2020-07-09: 15 mL via OROMUCOSAL
  Filled 2020-07-09 (×2): qty 15

## 2020-07-09 MED ORDER — LIDOCAINE 2% (20 MG/ML) 5 ML SYRINGE
INTRAMUSCULAR | Status: DC | PRN
  Administered 2020-07-09: 100 mg via INTRAVENOUS

## 2020-07-09 MED ORDER — LIDOCAINE HCL (PF) 1 % IJ SOLN
INTRAMUSCULAR | Status: AC
  Filled 2020-07-09: qty 60

## 2020-07-09 MED ORDER — SIMVASTATIN 20 MG PO TABS
40.0000 mg | ORAL_TABLET | Freq: Every day | ORAL | Status: DC
  Administered 2020-07-09: 40 mg via ORAL
  Filled 2020-07-09: qty 2

## 2020-07-09 MED ORDER — TAMSULOSIN HCL 0.4 MG PO CAPS
0.4000 mg | ORAL_CAPSULE | Freq: Every day | ORAL | Status: DC
  Administered 2020-07-09: 0.4 mg via ORAL
  Filled 2020-07-09: qty 1

## 2020-07-09 MED ORDER — DOCUSATE SODIUM 100 MG PO CAPS
100.0000 mg | ORAL_CAPSULE | Freq: Every day | ORAL | Status: DC
  Filled 2020-07-09: qty 1

## 2020-07-09 MED ORDER — LISINOPRIL 5 MG PO TABS
5.0000 mg | ORAL_TABLET | Freq: Every day | ORAL | Status: DC
  Filled 2020-07-09: qty 1

## 2020-07-09 MED ORDER — SUGAMMADEX SODIUM 200 MG/2ML IV SOLN
INTRAVENOUS | Status: DC | PRN
  Administered 2020-07-09: 200 mg via INTRAVENOUS

## 2020-07-09 MED ORDER — PHENYLEPHRINE HCL-NACL 10-0.9 MG/250ML-% IV SOLN
INTRAVENOUS | Status: DC | PRN
  Administered 2020-07-09: 50 ug/min via INTRAVENOUS

## 2020-07-09 MED ORDER — ROCURONIUM BROMIDE 10 MG/ML (PF) SYRINGE: PREFILLED_SYRINGE | INTRAVENOUS | Status: DC | PRN

## 2020-07-09 MED ORDER — IOHEXOL 350 MG/ML SOLN
INTRAVENOUS | Status: DC | PRN
  Administered 2020-07-09 (×3): 10 mL

## 2020-07-09 MED ORDER — POTASSIUM CHLORIDE CRYS ER 10 MEQ PO TBCR
10.0000 meq | EXTENDED_RELEASE_TABLET | Freq: Every day | ORAL | Status: DC
  Administered 2020-07-10: 10 meq via ORAL
  Filled 2020-07-09: qty 1

## 2020-07-09 MED ORDER — HEPARIN SODIUM (PORCINE) 1000 UNIT/ML IJ SOLN
INTRAMUSCULAR | Status: AC
  Filled 2020-07-09: qty 1

## 2020-07-09 SURGICAL SUPPLY — 18 items
BAG SNAP BAND KOVER 36X36 (MISCELLANEOUS) ×1 IMPLANT
CATH INFINITI 5FR ANG PIGTAIL (CATHETERS) ×1 IMPLANT
CLOSURE PERCLOSE PROSTYLE (VASCULAR PRODUCTS) ×2 IMPLANT
DEVICE WATCHMAN FLX PROC (KITS) IMPLANT
KIT DILATOR VASC 18G NDL (KITS) ×1 IMPLANT
KIT HEART LEFT (KITS) ×1 IMPLANT
KIT VERSACROSS LRG ACCESS (CATHETERS) ×1 IMPLANT
MAT PREVALON FULL STRYKER (MISCELLANEOUS) ×1 IMPLANT
PACK CARDIAC CATHETERIZATION (CUSTOM PROCEDURE TRAY) ×1 IMPLANT
PAD PRO RADIOLUCENT 2001M-C (PAD) ×1 IMPLANT
SHEATH PERFORMER 16FR 30 (SHEATH) ×1 IMPLANT
SHEATH PINNACLE 8F 10CM (SHEATH) ×1 IMPLANT
SHEATH PROBE COVER 6X72 (BAG) ×1 IMPLANT
WATCHMAN FLX 20 (Prosthesis & Implant Heart) ×1 IMPLANT
WATCHMAN FLX PROCEDURE DEVICE (KITS) ×2 IMPLANT
WATCHMAN PROCED TRUSEAL ACCESS (SHEATH) ×1 IMPLANT
WATCHMAN TRUSEAL ANTER CURVE (SHEATH) ×1 IMPLANT
WIRE AMPLATZ SS-J .035X180CM (WIRE) ×1 IMPLANT

## 2020-07-09 NOTE — Discharge Summary (Addendum)
[facility]   MULTIDISCIPLINARY HEART VALVE TEAM   STRUCTURAL HEART PROCEDURE DISCHARGE SUMMARY   Patient ID: Paul Lam,  MRN: [id number], DOB/AGE: 12-04-1940 80 y.o.  Admit date: 07/09/2020 Discharge date: 07/10/2020  Primary Care Physician: [name], MD  Primary Cardiologist: Pixie Casino, MD  Electrophysiologist: None  Primary Discharge Diagnosis:  Persistent Atrial Fibrillation Poor candidacy for long term anticoagulation due to h/o GI bleeding  Secondary Discharge Diagnosis:  HTN HLD OSA on CPAP Chronic diastolic CHF   Procedures This Admission:  1. Transeptal Puncture 2. Intra-procedural TEE which showed no LAA thrombus 3. Left atrial appendage occlusive device placement on 07/10/2019 by Dr. Burt Knack.  This study demonstrated: Procedure: 1) Transseptal Puncture 2) Watchman left atrial appendage occlusion 3) Perclose right femoral vein  Conclusion Successful LAAO procedure with a 20 mm Watchman Flex device, under fluoroscopic and TEE guidance  Recommend: apixaban 5 mg BID x 6 weeks, then clopidogrel monotherapy starting at 6 weeks and continuing through 6 months if criteria met on 6 week TEE     Brief HPI: Paul Lam is a 80 y.o. male with a history of Persistent Atrial Fibrillation. The patient was seen as an outpatient and thought to be a poor candidate for continued anticoagulation due to h/o GI bleeding. Risks, benefits, and alternatives to left atrial appendage occlusive device placement device were reviewed with the patient who wished to proceed.  The patient underwent intra-procedural TEE as above.   Hospital Course:  The patient was admitted and underwent Left Atrial appendage occlusive device placement with details as outlined above.  They were monitored on telemetry overnight which demonstrated afib with VRs ranging from 50-70s and a few pauses up to 2.7 seconds.  Groin was without complication on the day of discharge.  The  patient was examined and considered to be stable for discharge.  Wound care and restrictions were reviewed with the patient.  The patient will be seen back by Adline Peals, PA in 1 month with repeat TEE in approximately 45 days post procedure to ensure proper seal of the device. They will follow up with Dr. Burt Knack in 12 weeks for post closure follow up.   CHA2DS2VASC of 4. The patient understands the importance of continuing on their Eliquis alone x 6 weeks followed by Plavix monotherapy for 6 months if criteria met on 6 week TEE. This should be followed by lifelong aspirin 72m daily. Of note, the pt's BP has been soft. He reports low BPs at home ranging from SBP 70s-90s with associated orthostasis symptoms. Will stop his Lisinopril. His HRs have been slow with some pauses, could consider stopping lopressor at a later time if BP remains low.   Physical Exam: Vitals:   07/09/20 2010 07/09/20 2125 07/09/20 2314 07/10/20 0325  BP: (!) 103/47 (!) 104/54 (!) 93/46 (!) 93/49  Pulse: 77  72 60  Resp: 19 20 18 18   Temp: 97.7 F (36.5 C)  97.6 F (36.4 C) 97.7 F (36.5 C)  TempSrc: Oral  Oral Oral  SpO2: 96%  98% 98%  Weight:      Height:        GEN- The patient is well appearing, alert and oriented x 3 today.   HEENT: normocephalic, atraumatic; sclera clear, conjunctiva pink; hearing intact; oropharynx clear; neck supple  Lungs- Clear to ausculation bilaterally, normal work of breathing.  No wheezes, rales, rhonchi Heart- irreg irreg. no murmurs, rubs or gallops  GI- soft, non-tender, non-distended, bowel sounds present  Extremities- no clubbing, cyanosis, or edema; DP/PT/radial pulses 2+ bilaterally, groin without hematoma/bruit MS- no significant deformity or atrophy.  Groin site clear without hematoma or ecchymosis  Skin- warm and dry, no rash or lesion Psych- euthymic mood, full affect Neuro- strength and sensation are intact   Labs:   Lab Results  Component Value Date   WBC 4.8  06/10/2020   HGB 10.7 (L) 06/10/2020   HCT 35.8 (L) 06/10/2020   MCV 91.1 06/10/2020   PLT 113 (L) 06/10/2020    Recent Labs  Lab 07/10/20 0113  NA 140  K 4.3  CL 109  CO2 22  BUN 18  CREATININE 0.83  CALCIUM 8.3*  GLUCOSE 83     Discharge Medications:  Allergies as of 07/10/2020   No Known Allergies     Medication List    STOP taking these medications   lisinopril 5 MG tablet Commonly known as: ZESTRIL     TAKE these medications   ALPRAZolam 0.5 MG tablet Commonly known as: XANAX Take 0.5 mg at bedtime by mouth. Anxiety   brimonidine 0.15 % ophthalmic solution Commonly known as: ALPHAGAN Place 1 drop into the left eye 2 (two) times daily.   docusate sodium 100 MG capsule Commonly known as: COLACE Take 100 mg by mouth daily.   Eliquis 5 MG Tabs tablet Generic drug: apixaban TAKE (1) TABLET TWICE DAILY. What changed: See the new instructions.   ferrous sulfate 325 (65 FE) MG tablet Take 325 mg by mouth daily with breakfast.   FIBER-CAPS PO Take 1 capsule by mouth daily.   finasteride 5 MG tablet Commonly known as: PROSCAR Take 1 tablet (5 mg total) by mouth daily with supper.   furosemide 40 MG tablet Commonly known as: Lasix Take 1 tablet (40 mg total) by mouth daily.   metoprolol tartrate 25 MG tablet Commonly known as: LOPRESSOR Take 0.5 tablets (12.5 mg total) by mouth 2 (two) times daily.   NON FORMULARY at bedtime. VPAP   potassium chloride 10 MEQ tablet Commonly known as: KLOR-CON Take 10 mEq by mouth daily.   simvastatin 40 MG tablet Commonly known as: ZOCOR Take 40 mg by mouth at bedtime.   tamsulosin 0.4 MG Caps capsule Commonly known as: FLOMAX Take 1 capsule (0.4 mg total) by mouth in the morning and at bedtime. Take 1 capsule (0.4 mg) by mouth daily with supper (~1830) & take 1 capsule (0.4 mg) by mouth at bedtime.   TART CHERRY ADVANCED PO Take 1 tablet by mouth daily.   vitamin C 1000 MG tablet Take 1,000 mg by mouth  daily.   Vitamin D3 50 MCG (2000 UT) Tabs Take 2,000 Units by mouth daily.   Zinc 50 MG Tabs Take by mouth.       Disposition:  home   Follow-up Information    [facility].   Specialty: Cardiology Why: 08/17/20 @ 9:30AM with R. Marlene Lard, PA Contact information: [address] [id number] [place] [phone]              Duration of Discharge Encounter: Greater than 30 minutes including physician time.  Signed, Angelena Form, PA-C  07/10/2020 8:22 AM  Patient seen, examined. Available data reviewed. Agree with findings, assessment, and plan as outlined by Nell Range, PA-C.  Patient independently interviewed and examined this morning.  His wife is at the bedside.  He is alert, oriented, no distress.  Lungs are clear, heart is irregularly irregular  with no murmur gallop, abdomen is soft and nontender, right groin site is clear, extremities have no edema.  I agree with discontinuing his lisinopril as he has been having problems with hypotension at home.  He will remain on his other medications.  We will continue apixaban alone for 6 weeks.  If his TEE meets criteria at 6 weeks, we will transition him to clopidogrel at that time.  Reviewed recommendations with the patient and his wife.  They understand and he is medically stable for discharge.  Sherren Mocha, M.D. 07/10/2020 10:25 AM

## 2020-07-09 NOTE — Progress Notes (Signed)
  Echocardiogram 2D Echocardiogram has been performed.  Paul Lam M 07/09/2020, 1:49 PM

## 2020-07-09 NOTE — Anesthesia Procedure Notes (Signed)
Procedure Name: Intubation Date/Time: 07/09/2020 12:01 PM Performed by: Eligha Bridegroom, CRNA Pre-anesthesia Checklist: Patient identified, Emergency Drugs available, Suction available, Patient being monitored and Timeout performed Patient Re-evaluated:Patient Re-evaluated prior to induction Oxygen Delivery Method: Circle system utilized Preoxygenation: Pre-oxygenation with 100% oxygen Induction Type: IV induction Ventilation: Oral airway inserted - appropriate to patient size and Mask ventilation without difficulty Laryngoscope Size: Mac and 4 Grade View: Grade II Tube type: Oral Tube size: 7.5 mm Number of attempts: 1 Airway Equipment and Method: Stylet Secured at: 22 cm Tube secured with: Tape Dental Injury: Teeth and Oropharynx as per pre-operative assessment

## 2020-07-09 NOTE — Progress Notes (Signed)
Spoke to Dr. Burt Knack in regards to medications due at 1415, informed ok to hold until tomorrow, Dr. Burt Knack made aware of pt VS, safety maintained

## 2020-07-09 NOTE — Procedures (Signed)
[place] Procedure   Surgeon: Sherren Mocha, MD Co-Surgeon: Lars Mage, MD Imager: Jenkins Rouge, MD   Procedure: 1) Transseptal Puncture 2) Watchman left atrial appendage occlusion 3) Perclose right femoral vein   Device Implant: Watchman 48mm   Background and Indication: 80 yo man with atrial fibrillation, recurrent GI bleeding, felt to be a poor candidate for long term anticoagulation. He is referred for Watchman implantation.    Procedure description: US guidance is used for right femoral venous access. [place] images are stored digitally in the patient's chart. Double Preclose is performed at 10" and 2" positions using normal technique and a 16 Fr sheath is inserted. Transseptal puncture is then performed after fully anticoagulating the patient with unfractionated heparin. A Versacross system is used with RF energy to cross the interatrial septum under fluoroscopic and TEE guidance. Please see Dr Antionette Char detailed report for further description of transseptal puncture and Watchman Access Sheath insertion.   Once the 14 Fr access sheath is in appropriate position in the left atrial appendage, a 20 mm Watchman Flex device is inserted and deployed to obtain appropriate position and compression in the left atrial appendage. 2 recaptures are required. Thorough TEE imaging is performed to evaluate all PASS criteria prior to device release. Angiography is performed prior to device release. After device release, the device remains in stable position with complete occlusion of the appendage. The sheath is removed from the body and the previously deployed perclose sutures are tightened for complete hemostasis.    EBL: minimal   Pt is transferred to the recovery area following the procedure with no early apparent complications.     Lysbeth Galas T. Quentin Ore, MD, [facility] Cardiac Electrophysiology

## 2020-07-09 NOTE — Discharge Instructions (Signed)
WATCHMANT Procedure, Care After  Procedure MD: Dr. Cooper Watchman Clinical Coordinator: Ernest Dick, RN  This sheet gives you information about how to care for yourself after your procedure. Your health care provider may also give you more specific instructions. If you have problems or questions, contact your health care provider.  What can I expect after the procedure? After the procedure, it is common to have:  Bruising around your puncture site.  Tenderness around your puncture site.  Tiredness (fatigue).  Medication instructions . It is very important to continue to take your blood thinner as well as Aspirin after the Watchman procedure. Call your procedure doctor's office with question or concerns. . If you are on Coumadin (warfarin), you will have your INR checked the week after your procedure, with a goal INR of 2.0 - 3.0. . Please follow your medication instructions on your discharge summary. Only take the medications listed on your discharge paperwork.  Follow up . You will be seen in 1 week and 6 weeks after your procedure in the Atrial Fibrillation Clinic . You will have another TEE (Transesophageal Echocardiogram) approximately 6 weeks after your procedure mark to check your device . You will follow up the MD who performed your procedure 6 months after your procedure . The Watchman Clinical Coordinator will check in with you from time to time, including 1 and 2 years after your procedure.    Follow these instructions at home: Puncture site care   Follow instructions from your health care provider about how to take care of your puncture site. Make sure you: ? If present, leave stitches (sutures), skin glue, or adhesive strips in place.  ? If a large square bandage is present, this may be removed 24 hours after surgery.   Check your puncture site every day for signs of infection. Check for: ? Redness, swelling, or pain. ? Fluid or blood. If your puncture site starts  to bleed, lie down on your back, apply firm pressure to the area, and contact your health care provider. ? Warmth. ? Pus or a bad smell. Driving  Do not drive yourself home if you received sedation  Do not drive for at least 4 days after your procedure or however long your health care provider recommends. (Do not resume driving if you have previously been instructed not to drive for other health reasons.)  Do not spend greater than 1 hour at a time in a car for the first 3 days. Stop and take a break with a 5 minute walk at least every hour.   Do not drive or use heavy machinery while taking prescription pain medicine.  Activity  Avoid activities that take a lot of effort, including exercise, for at least 7 days after your procedure.  For the first 3 days, avoid sitting for longer than one hour at a time.   Avoid alcoholic beverages, signing paperwork, or participating in legal proceedings for 24 hours after receiving sedation  Do not lift anything that is heavier than 10 lb (4.5 kg) for one week.   No sexual activity for 1 week.   Return to your normal activities as told by your health care provider. Ask your health care provider what activities are safe for you. General instructions  Take over-the-counter and prescription medicines only as told by your health care provider.  Do not use any products that contain nicotine or tobacco, such as cigarettes and e-cigarettes. If you need help quitting, ask your health care provider.  You   may shower after 24 hours, but Do not take baths, swim, or use a hot tub for 1 week.   Do not drink alcohol for 24 hours after your procedure.  Keep all follow-up visits as told by your health care provider. This is important.  Dental Work: You will require antibiotics prior to any dental work, including cleanings, for 6 months after your Watchman implantation to help protect you from infection. After 6 months, antibiotics are no longer  required. Contact a health care provider if:  You have redness, mild swelling, or pain around your puncture site.  You have soreness in your throat or at your puncture site that does not improve after several days  You have fluid or blood coming from your puncture site that stops after applying firm pressure to the area.  Your puncture site feels warm to the touch.  You have pus or a bad smell coming from your puncture site.  You have a fever.  You have chest pain or discomfort that spreads to your neck, jaw, or arm.  You are sweating a lot.  You feel nauseous.  You have a fast or irregular heartbeat.  You have shortness of breath.  You are dizzy or light-headed and feel the need to lie down.  You have pain or numbness in the arm or leg closest to your puncture site. Get help right away if:  Your puncture site suddenly swells.  Your puncture site is bleeding and the bleeding does not stop after applying firm pressure to the area. These symptoms may represent a serious problem that is an emergency. Do not wait to see if the symptoms will go away. Get medical help right away. Call your local emergency services (911 in the [place] Do not drive yourself to the hospital. Summary  After the procedure, it is normal to have bruising and tenderness at the puncture site in your groin, neck, or forearm.  Check your puncture site every day for signs of infection.  Get help right away if your puncture site is bleeding and the bleeding does not stop after applying firm pressure to the area. This is a medical emergency.  This information is not intended to replace advice given to you by your health care provider. Make sure you discuss any questions you have with your health care provider.    You have an appointment set up with the [facility].  Multiple studies have shown that being followed by a dedicated atrial fibrillation clinic in addition to the standard care you  receive from your other physicians improves health. We believe that enrollment in the atrial fibrillation clinic will allow [place] to better care for you.   The phone number to the [facility] is [phone]. The clinic is staffed Monday through Friday from 8:30am to 5pm.  Parking Directions: The clinic is located in the Heart and Vascular Building connected to [facility]. 1)From [address] turn on to [address] and go to the 3rd entrance  (Heart and Vascular entrance) on the right. 2)Look to the right for Heart &Vascular Parking Garage. 3)A code for the entrance is required, for March is 1223.   4)Take the elevators to the 1st floor. Registration is in the room with the glass walls at the end of the hallway.  If you have any trouble parking or locating the clinic, please don't hesitate to call [phone].

## 2020-07-09 NOTE — Progress Notes (Signed)
LOCATION:  Right RADIAL   TIME BAND OFF/DRESSING APPLIED:  A-line to right wrist removed  SITE UPON ARRIVAL: LEVEL 0  SITE AFTER BAND REMOVAL: LEVEL 0  CIRCULATION SENSATION AND MOVEMENT: Yes, +2 radial pulse  COMMENTS:

## 2020-07-09 NOTE — Anesthesia Procedure Notes (Signed)
Arterial Line Insertion Start/End2/08/2020 10:15 AM, 07/09/2020 10:20 AM Performed by: Nolon Nations, MD, Wilburn Cornelia, CRNA, anesthesiologist  Patient location: Pre-op. Preanesthetic checklist: patient identified, IV checked, site marked, risks and benefits discussed, surgical consent, monitors and equipment checked, pre-op evaluation, timeout performed and anesthesia consent Lidocaine 1% used for infiltration radial was placed Catheter size: 20 Fr Hand hygiene performed  and maximum sterile barriers used   Attempts: 3 Procedure performed using ultrasound guided technique. Ultrasound Notes:anatomy identified, needle tip was noted to be adjacent to the nerve/plexus identified, no ultrasound evidence of intravascular and/or intraneural injection and image(s) printed for medical record Following insertion, dressing applied and Biopatch. Post procedure assessment: normal and unchanged

## 2020-07-09 NOTE — Transfer of Care (Signed)
Immediate Anesthesia Transfer of Care Note  Patient: Paul Lam  Procedure(s) Performed: LEFT ATRIAL APPENDAGE OCCLUSION (N/A ) TRANSESOPHAGEAL ECHOCARDIOGRAM (TEE) (N/A )  Patient Location: PACU  Anesthesia Type:General  Level of Consciousness: awake, alert  and oriented  Airway & Oxygen Therapy: Patient Spontanous Breathing and Patient connected to nasal cannula oxygen  Post-op Assessment: Report given to RN and Post -op Vital signs reviewed and stable  Post vital signs: Reviewed and stable  Last Vitals:  Vitals Value Taken Time  BP 108/48 07/09/20 1346  Temp    Pulse 70 07/09/20 1350  Resp 13 07/09/20 1350  SpO2 100 % 07/09/20 1350  Vitals shown include unvalidated device data.  Last Pain:  Vitals:   07/09/20 0932  TempSrc:   PainSc: 0-No pain      Patients Stated Pain Goal: 0 (23/30/07 6226)  Complications: No complications documented.

## 2020-07-09 NOTE — Anesthesia Preprocedure Evaluation (Addendum)
Anesthesia Evaluation  Patient identified by MRN, date of birth, ID band Patient awake    Reviewed: Allergy & Precautions, NPO status , Patient's Chart, lab work & pertinent test results  History of Anesthesia Complications (+) PONV and history of anesthetic complications  Airway Mallampati: II  TM Distance: >3 FB Neck ROM: Full    Dental  (+) Dental Advisory Given, Teeth Intact   Pulmonary shortness of breath, sleep apnea and Continuous Positive Airway Pressure Ventilation ,    Pulmonary exam normal breath sounds clear to auscultation       Cardiovascular Exercise Tolerance: Good hypertension, Pt. on medications + angina + CAD and +CHF  + dysrhythmias Atrial Fibrillation + Valvular Problems/Murmurs AS  Rhythm:Irregular Rate:Normal  Echo 02/2020 1. Left ventricular ejection fraction, by estimation, is 60 to 65%. The left ventricle has normal function. The left ventricle has no regional wall motion abnormalities. Left ventricular diastolic parameters are indeterminate.  2. Right ventricular systolic function is normal. The right ventricular size is normal. There is normal pulmonary artery systolic pressure. The estimated right ventricular systolic pressure is 72.6 mmHg.  3. Left atrial size was moderately dilated.  4. The mitral valve is abnormal, mildly thickened and calcified. Mild mitral valve regurgitation.  5. The aortic valve is tricuspid. There is moderate calcification of the aortic valve. Aortic valve regurgitation is mild. Mild to moderate aortic valve stenosis. Aortic regurgitation PHT measures 725 msec. Aortic valve mean gradient measures 11.3 mmHg. Aortic valve Vmax measures 2.29 m/s.  6. The inferior vena cava is dilated in size with >50% respiratory variability, suggesting right atrial pressure of 8 mmHg.    Neuro/Psych  Headaches, PSYCHIATRIC DISORDERS Anxiety Depression    GI/Hepatic negative GI ROS, Neg liver  ROS,   Endo/Other  negative endocrine ROS  Renal/GU negative Renal ROS     Musculoskeletal  (+) Arthritis ,   Abdominal   Peds  Hematology  (+) anemia ,   Anesthesia Other Findings   Reproductive/Obstetrics negative OB ROS                           Anesthesia Physical  Anesthesia Plan  ASA: III  Anesthesia Plan: General   Post-op Pain Management:    Induction: Intravenous  PONV Risk Score and Plan: 3 and Ondansetron, Dexamethasone and Treatment may vary due to age or medical condition  Airway Management Planned: Oral ETT  Additional Equipment: Arterial line  Intra-op Plan:   Post-operative Plan: Extubation in OR  Informed Consent: I have reviewed the patients History and Physical, chart, labs and discussed the procedure including the risks, benefits and alternatives for the proposed anesthesia with the patient or authorized representative who has indicated his/her understanding and acceptance.     Dental advisory given  Plan Discussed with: CRNA  Anesthesia Plan Comments: (2 x PIV)     Anesthesia Quick Evaluation

## 2020-07-09 NOTE — Interval H&P Note (Signed)
History and Physical Interval Note:  07/09/2020 11:06 AM  Paul Lam  has presented today for surgery, with the diagnosis of Atrial Fibrillation.  The various methods of treatment have been discussed with the patient and family. After consideration of risks, benefits and other options for treatment, the patient has consented to  Procedure(s): LEFT ATRIAL APPENDAGE OCCLUSION (N/A) TRANSESOPHAGEAL ECHOCARDIOGRAM (TEE) (N/A) as a surgical intervention.  The patient's history has been reviewed, patient examined, no change in status, stable for surgery.  I have reviewed the patient's chart and labs.  Questions were answered to the patient's satisfaction.    The patient is independently evaluated in the short stay area prior to the procedure.  I again went over the watchman left atrial appendage occlusion procedure with the patient.  He understands the steps and potential risks as outlined above.  All of his questions are answered.  He is stable and there are no changes to document from the time of his recent evaluation above.  Sherren Mocha

## 2020-07-09 NOTE — Plan of Care (Signed)

## 2020-07-10 ENCOUNTER — Encounter (HOSPITAL_COMMUNITY): Payer: Self-pay | Admitting: Cardiovascular Disease

## 2020-07-10 DIAGNOSIS — I4819 Other persistent atrial fibrillation: Secondary | ICD-10-CM | POA: Diagnosis not present

## 2020-07-10 DIAGNOSIS — Z006 Encounter for examination for normal comparison and control in clinical research program: Secondary | ICD-10-CM | POA: Diagnosis not present

## 2020-07-10 DIAGNOSIS — I4811 Longstanding persistent atrial fibrillation: Secondary | ICD-10-CM

## 2020-07-10 DIAGNOSIS — I5032 Chronic diastolic (congestive) heart failure: Secondary | ICD-10-CM | POA: Diagnosis not present

## 2020-07-10 DIAGNOSIS — D6869 Other thrombophilia: Secondary | ICD-10-CM | POA: Diagnosis not present

## 2020-07-10 LAB — BASIC METABOLIC PANEL
Anion gap: 9 (ref 5–15)
BUN: 18 mg/dL (ref 8–23)
CO2: 22 mmol/L (ref 22–32)
Calcium: 8.3 mg/dL — ABNORMAL LOW (ref 8.9–10.3)
Chloride: 109 mmol/L (ref 98–111)
Creatinine, Ser: 0.83 mg/dL (ref 0.61–1.24)
GFR, Estimated: >60 mL/min (ref >60)
Glucose, Bld: 83 mg/dL (ref 70–99)
Potassium: 4.3 mmol/L (ref 3.5–5.1)
Sodium: 140 mmol/L (ref 135–145)

## 2020-07-10 MED FILL — Lidocaine HCl Local Preservative Free (PF) Inj 1%: INTRAMUSCULAR | Qty: 30 | Status: AC

## 2020-07-10 MED FILL — Heparin Sod (Porcine)-NaCl IV Soln 1000 Unit/500ML-0.9%: INTRAVENOUS | Qty: 1000 | Status: AC

## 2020-07-10 NOTE — Plan of Care (Signed)
  Problem: Education: Goal: Knowledge of General Education information will improve Description: Including pain rating scale, medication(s)/side effects and non-pharmacologic comfort measures Outcome: Progressing   Problem: Clinical Measurements: Goal: Ability to maintain clinical measurements within normal limits will improve Outcome: Progressing Goal: Will remain free from infection Outcome: Progressing Goal: Respiratory complications will improve Outcome: Progressing Goal: Cardiovascular complication will be avoided Outcome: Progressing   Problem: Activity: Goal: Risk for activity intolerance will decrease Outcome: Progressing   Problem: Nutrition: Goal: Adequate nutrition will be maintained Outcome: Progressing   Problem: Coping: Goal: Level of anxiety will decrease Outcome: Progressing   Problem: Elimination: Goal: Will not experience complications related to urinary retention Outcome: Progressing   Problem: Pain Managment: Goal: General experience of comfort will improve Outcome: Progressing

## 2020-07-10 NOTE — Plan of Care (Signed)

## 2020-07-10 NOTE — Anesthesia Postprocedure Evaluation (Signed)
Anesthesia Post Note  Patient: Paul Lam  Procedure(s) Performed: LEFT ATRIAL APPENDAGE OCCLUSION (N/A ) TRANSESOPHAGEAL ECHOCARDIOGRAM (TEE) (N/A )     Patient location during evaluation: PACU Anesthesia Type: General Level of consciousness: sedated and patient cooperative Pain management: pain level controlled Vital Signs Assessment: post-procedure vital signs reviewed and stable Respiratory status: spontaneous breathing Cardiovascular status: stable Anesthetic complications: no   No complications documented.  Last Vitals:  Vitals:   07/10/20 0325 07/10/20 0930  BP: (!) 93/49 (!) 101/52  Pulse: 60 (!) 53  Resp: 18 17  Temp: 36.5 C   SpO2: 98% 97%    Last Pain:  Vitals:   07/10/20 0720  TempSrc:   PainSc: 0-No pain                 Nolon Nations

## 2020-07-10 NOTE — Progress Notes (Signed)
Discharge instructions (including medications) discussed with and copy provided to patient/caregiver 

## 2020-07-10 NOTE — Plan of Care (Signed)
  Problem: Education: Goal: Knowledge of General Education information will improve Description: Including pain rating scale, medication(s)/side effects and non-pharmacologic comfort measures 07/10/2020 0939 by Roma Kayser, RN Outcome: Adequate for Discharge 07/10/2020 [phone] by Roma Kayser, RN Outcome: Adequate for Discharge

## 2020-07-10 NOTE — Progress Notes (Signed)
Updated med list/discharge info given to pt and wife by RN. IVs removed. Belongings with pt and wife. NT taking pt to car. Pt's wife to transport pt home.

## 2020-07-15 ENCOUNTER — Telehealth: Payer: Self-pay | Admitting: Nurse Practitioner

## 2020-07-15 NOTE — Telephone Encounter (Signed)
Post Watchman follow up call placed this morning to Paul Lam. Spoke to his wife [name] and she reports that he is doing well and has no complaints since procedure last week. I advised to contact me if he has any questions and also reviewed his 45 day TEE appointment and next steps in the process.  Ambrose Pancoast, RN Watchman Coordinator

## 2020-08-03 SURGERY — Surgical Case
Anesthesia: *Unknown

## 2020-08-12 ENCOUNTER — Ambulatory Visit (INDEPENDENT_AMBULATORY_CARE_PROVIDER_SITE_OTHER): Payer: Medicare Other | Admitting: Urology

## 2020-08-12 ENCOUNTER — Encounter: Payer: Self-pay | Admitting: Urology

## 2020-08-12 ENCOUNTER — Other Ambulatory Visit: Payer: Self-pay

## 2020-08-12 VITALS — BP 106/65 | HR 71 | Temp 98.7°F | Ht 68.0 in | Wt 240.0 lb

## 2020-08-12 DIAGNOSIS — R351 Nocturia: Secondary | ICD-10-CM | POA: Diagnosis not present

## 2020-08-12 DIAGNOSIS — N401 Enlarged prostate with lower urinary tract symptoms: Secondary | ICD-10-CM

## 2020-08-12 LAB — URINALYSIS, ROUTINE W REFLEX MICROSCOPIC
Bilirubin, UA: NEGATIVE
Glucose, UA: NEGATIVE
Ketones, UA: NEGATIVE
Leukocytes,UA: NEGATIVE
Nitrite, UA: NEGATIVE
Protein,UA: NEGATIVE
RBC, UA: NEGATIVE
Specific Gravity, UA: 1.015 (ref 1.005–1.030)
Urobilinogen, Ur: 1 mg/dL (ref 0.2–1.0)
pH, UA: 6.5 (ref 5.0–7.5)

## 2020-08-12 MED ORDER — FINASTERIDE 5 MG PO TABS: 5.0000 mg | ORAL_TABLET | Freq: Every day | ORAL | 3 refills | Status: DC

## 2020-08-12 MED ORDER — TAMSULOSIN HCL 0.4 MG PO CAPS: 0.4000 mg | ORAL_CAPSULE | Freq: Two times a day (BID) | ORAL | 3 refills | Status: DC

## 2020-08-12 NOTE — Progress Notes (Signed)
Urological Symptom Review  Patient is experiencing the following symptoms: none   Review of Systems  Gastrointestinal (upper)  : Negative for upper GI symptoms  Gastrointestinal (lower) : Negative for lower GI symptoms  Constitutional : Negative for symptoms  Skin: Negative for skin symptoms  Eyes: Negative for eye symptoms  Ear/Nose/Throat : Negative for Ear/Nose/Throat symptoms  Hematologic/Lymphatic: Easy bruising  Cardiovascular : Negative for cardiovascular symptoms  Respiratory : Negative for respiratory symptoms  Endocrine: Negative for endocrine symptoms  Musculoskeletal: Negative for musculoskeletal symptoms  Neurological: Dizziness  Psychologic: Anxiety

## 2020-08-12 NOTE — Patient Instructions (Signed)

## 2020-08-12 NOTE — Progress Notes (Signed)
08/12/2020 11:13 AM   Paul Lam 19-Apr-1941 [id number]  Referring provider: Sharilyn Lam, [address] [place],  [place] [address]  followup BPH  HPI: [name] is a 80yo here for followup for BPH and nocturia. IPSS 2 with QOL 1. Nocturia 1x. He is very happy with his urination. He is on flomax BID and finasteride. NO side effects from medications. He has afib on eliquis   PMH: Past Medical History:  Diagnosis Date  . Anginal pain ([place])   . Anxiety   . Arthritis    generalized arthritis   . Atrial fibrillation ([place])   . CAD (coronary artery disease)   . Cancer ([place])    skin cancer removed left ear    . Complication of anesthesia   . Depression   . Headache   . History of echocardiogram 02/19/2009   EF 50-55%; LA mild-mod dilated;   . History of kidney stones   . History of nuclear stress test 02/19/2009   low risk, normal   . Hyperlipidemia   . Hypertension   . Obesity (BMI 30-39.9)   . OSA (obstructive sleep apnea)    severe with AHI 25/hr now on BiPAP  . PONV (postoperative nausea and vomiting)   . Retinal detachment    with proliferative vitreoretinopathy  . Shortness of breath    related to weight   . Sleep apnea   . Vertigo     Surgical History: Past Surgical History:  Procedure Laterality Date  . APPENDECTOMY    . BIOPSY  03/05/2020   Procedure: BIOPSY;  Surgeon: Paul Houston, MD;  [facility];  Service: Endoscopy;;  . CARDIAC CATHETERIZATION  07/13/2005   noncritical CAD  . CARDIOVERSION N/A 01/29/2018   Procedure: CARDIOVERSION;  Surgeon: Paul Spark, MD;  Location: [facility];  Service: Cardiovascular;  Laterality: N/A;  . COLONOSCOPY WITH PROPOFOL N/A 03/06/2020   Procedure: COLONOSCOPY WITH PROPOFOL;  Surgeon: Paul Quale, MD;  [facility];  Service: Gastroenterology;  Laterality: N/A;  . ESOPHAGOGASTRODUODENOSCOPY (EGD) WITH PROPOFOL N/A 03/05/2020   Procedure: ESOPHAGOGASTRODUODENOSCOPY  (EGD) WITH PROPOFOL;  Surgeon: Paul Houston, MD;  [facility];  Service: Endoscopy;  Laterality: N/A;  . EYE SURGERY    . FLEXIBLE SIGMOIDOSCOPY N/A 07/14/2015   Procedure: FLEXIBLE SIGMOIDOSCOPY;  Surgeon: Paul Signs, MD;  [facility];  Service: Gastroenterology;  Laterality: N/A;  . GIVENS CAPSULE STUDY N/A 04/15/2020   Procedure: GIVENS CAPSULE STUDY;  Surgeon: Paul Quale, MD;  [facility];  Service: Gastroenterology;  Laterality: N/A;  730  . JOINT REPLACEMENT    . LEFT ATRIAL APPENDAGE OCCLUSION N/A 07/09/2020   Procedure: LEFT ATRIAL APPENDAGE OCCLUSION;  Surgeon: Paul Mocha, MD;  Location: [facility];  Service: Cardiovascular;  Laterality: N/A;  . NASAL SEPTOPLASTY W/ TURBINOPLASTY Bilateral 12/19/2017   Procedure: NASAL SEPTOPLASTY WITH TURBINATE REDUCTION;  Surgeon: Paul Baptist, MD;  Location: [facility];  Service: ENT;  Laterality: Bilateral;  . NASAL SINUS SURGERY    . PARS PLANA VITRECTOMY Left 06/20/2017   Procedure: PARS PLANA VITRECTOMY WITH 25 GAUGE, REPAIR OF COMPLEX TRACTION RETINAL DETACHMENT, MEMBRANE PEEEL;  Surgeon: Paul Pedro, MD;  Location: [facility];  Service: Ophthalmology;  Laterality: Left;  . POLYPECTOMY  03/06/2020   Procedure: POLYPECTOMY;  Surgeon: Paul Quale, MD;  [facility];  Service: Gastroenterology;;  . REPAIR OF COMPLEX TRACTION RETINAL DETACHMENT Left 06/20/2017  .  RETINAL DETACHMENT SURGERY Right 04/2011  . SCLERAL BUCKLE WITH POSSIBLE 25 GAUGE PARS PLANA VITRECTOMY Left 05/22/2017   Procedure: SCLERAL BUCKLE, LASER, GAS INJECTION LEFT EYE;  Surgeon: Paul Pedro, MD;  Location: [facility];  Service: Ophthalmology;  Laterality: Left;  . SKIN CANCER EXCISION Left 2013   left ear skin ca removed  2 weeks ago - not completeely healed 08/15/11   . TEE WITHOUT CARDIOVERSION N/A 07/09/2020   Procedure: TRANSESOPHAGEAL ECHOCARDIOGRAM (TEE);  Surgeon: Paul Mocha, MD;  Location: [facility];  Service: Cardiovascular;  Laterality: N/A;  . TONSILLECTOMY    . TOTAL KNEE ARTHROPLASTY  08/22/2011   Procedure: TOTAL KNEE ARTHROPLASTY;  Surgeon: Paul Alf, MD;  [facility];  Service: Orthopedics;  Laterality: Right;    Home Medications:  Allergies as of 08/12/2020   No Known Allergies     Medication List       Accurate as of August 12, 2020 11:13 AM. If you have any questions, ask your nurse or doctor.        ALPRAZolam 0.5 MG tablet Commonly known as: XANAX Take 0.5 mg at bedtime by mouth. Anxiety   brimonidine 0.15 % ophthalmic solution Commonly known as: ALPHAGAN Place 1 drop into the left eye 2 (two) times daily.   docusate sodium 100 MG capsule Commonly known as: COLACE Take 100 mg by mouth daily.   Eliquis 5 MG Tabs tablet Generic drug: apixaban TAKE (1) TABLET TWICE DAILY. What changed: See the new instructions.   ferrous sulfate 325 (65 FE) MG tablet Take 325 mg by mouth daily with breakfast.   FIBER-CAPS PO Take 1 capsule by mouth daily.   finasteride 5 MG tablet Commonly known as: PROSCAR Take 1 tablet (5 mg total) by mouth daily with supper.   furosemide 40 MG tablet Commonly known as: Lasix Take 1 tablet (40 mg total) by mouth daily.   metoprolol tartrate 25 MG tablet Commonly known as: LOPRESSOR Take 0.5 tablets (12.5 mg total) by mouth 2 (two) times daily.   NON FORMULARY at bedtime. VPAP   potassium chloride 10 MEQ tablet Commonly known as: KLOR-CON Take 10 mEq by mouth daily.   simvastatin 40 MG tablet Commonly known as: ZOCOR Take 40 mg by mouth at bedtime.   tamsulosin 0.4 MG Caps capsule Commonly known as: FLOMAX Take 1 capsule (0.4 mg total) by mouth in the morning and at bedtime. Take 1 capsule (0.4 mg) by mouth daily with supper (~1830) & take 1 capsule (0.4 mg) by mouth at bedtime.   TART CHERRY ADVANCED PO Take 1 tablet by mouth daily.   vitamin C 1000 MG tablet Take  1,000 mg by mouth daily.   Vitamin D3 50 MCG (2000 UT) Tabs Take 2,000 Units by mouth daily.   Zinc 50 MG Tabs Take by mouth.       Allergies: No Known Allergies  Family History: Family History  Problem Relation Age of Onset  . Heart failure Mother        also arthritis  . Stroke Father        also emphysema  . Heart failure Father     Social History:  reports that he has never smoked. He has never used smokeless tobacco. He reports that he does not drink alcohol and does not use drugs.  ROS: All other review of systems were reviewed and are negative except what is noted above in HPI  Physical Exam: BP 106/65   Pulse  71   Temp 98.7 F (37.1 C)   Ht 5\' 8"  (1.727 m)   Wt 240 lb (108.9 kg)   BMI 36.49 kg/m   Constitutional:  Alert and oriented, No acute distress. HEENT: [place] AT, moist mucus membranes.  Trachea midline, no masses. Cardiovascular: No clubbing, cyanosis, or edema. Respiratory: Normal respiratory effort, no increased work of breathing. GI: Abdomen is soft, nontender, nondistended, no abdominal masses GU: No CVA tenderness.  Lymph: No cervical or inguinal lymphadenopathy. Skin: No rashes, bruises or suspicious lesions. Neurologic: Grossly intact, no focal deficits, moving all 4 extremities. Psychiatric: Normal mood and affect.  Laboratory Data: Lab Results  Component Value Date   WBC 4.8 06/10/2020   HGB 10.7 (L) 06/10/2020   HCT 35.8 (L) 06/10/2020   MCV 91.1 06/10/2020   PLT 113 (L) 06/10/2020    Lab Results  Component Value Date   CREATININE 0.83 07/10/2020    No results found for: PSA  No results found for: TESTOSTERONE  No results found for: HGBA1C  Urinalysis    Component Value Date/Time   COLORURINE YELLOW 06/26/2018 1045   APPEARANCEUR HAZY (A) 06/26/2018 1045   LABSPEC 1.021 06/26/2018 1045   PHURINE 6.0 06/26/2018 1045   GLUCOSEU NEGATIVE 06/26/2018 1045   HGBUR SMALL (A) 06/26/2018 1045   BILIRUBINUR neg 08/14/2019 1151    KETONESUR NEGATIVE 06/26/2018 1045   PROTEINUR Negative 08/14/2019 1151   PROTEINUR NEGATIVE 06/26/2018 1045   UROBILINOGEN 0.2 08/14/2019 1151   UROBILINOGEN 0.2 08/15/2011 1248   NITRITE neg 08/14/2019 1151   NITRITE POSITIVE (A) 06/26/2018 1045   LEUKOCYTESUR Negative 08/14/2019 1151    Lab Results  Component Value Date   BACTERIA MANY (A) 06/26/2018    Pertinent Imaging:  No results found for this or any previous visit.  No results found for this or any previous visit.  No results found for this or any previous visit.  No results found for this or any previous visit.  No results found for this or any previous visit.  No results found for this or any previous visit.  No results found for this or any previous visit.  No results found for this or any previous visit.   Assessment & Plan:    1. Benign localized prostatic hyperplasia with lower urinary tract symptoms (LUTS) -continue flomax BID and finasteride - Urinalysis, Routine w reflex microscopic  2. Nocturia -Continue flomax BID and finasteride   No follow-ups on file.  Nicolette Bang, MD  [facility] [place]

## 2020-08-17 ENCOUNTER — Other Ambulatory Visit: Payer: Self-pay

## 2020-08-17 ENCOUNTER — Ambulatory Visit (HOSPITAL_COMMUNITY)
Admit: 2020-08-17 | Discharge: 2020-08-17 | Disposition: A | Discharge status: Home / Self Care | Payer: Medicare Other | Attending: Physician Assistant | Admitting: Physician Assistant

## 2020-08-17 ENCOUNTER — Encounter (HOSPITAL_COMMUNITY): Payer: Self-pay | Admitting: Physician Assistant

## 2020-08-17 VITALS — BP 124/76 | HR 54 | Ht 68.0 in | Wt 217.0 lb

## 2020-08-17 DIAGNOSIS — Z9989 Dependence on other enabling machines and devices: Secondary | ICD-10-CM | POA: Diagnosis not present

## 2020-08-17 DIAGNOSIS — Z96651 Presence of right artificial knee joint: Secondary | ICD-10-CM | POA: Diagnosis not present

## 2020-08-17 DIAGNOSIS — Z8719 Personal history of other diseases of the digestive system: Secondary | ICD-10-CM | POA: Diagnosis not present

## 2020-08-17 DIAGNOSIS — G4733 Obstructive sleep apnea (adult) (pediatric): Secondary | ICD-10-CM | POA: Diagnosis not present

## 2020-08-17 DIAGNOSIS — Z8249 Family history of ischemic heart disease and other diseases of the circulatory system: Secondary | ICD-10-CM | POA: Diagnosis not present

## 2020-08-17 DIAGNOSIS — R001 Bradycardia, unspecified: Secondary | ICD-10-CM | POA: Diagnosis not present

## 2020-08-17 DIAGNOSIS — Z823 Family history of stroke: Secondary | ICD-10-CM | POA: Diagnosis not present

## 2020-08-17 DIAGNOSIS — I4819 Other persistent atrial fibrillation: Secondary | ICD-10-CM | POA: Insufficient documentation

## 2020-08-17 DIAGNOSIS — E785 Hyperlipidemia, unspecified: Secondary | ICD-10-CM | POA: Insufficient documentation

## 2020-08-17 DIAGNOSIS — Z79899 Other long term (current) drug therapy: Secondary | ICD-10-CM | POA: Diagnosis not present

## 2020-08-17 DIAGNOSIS — Z7901 Long term (current) use of anticoagulants: Secondary | ICD-10-CM | POA: Insufficient documentation

## 2020-08-17 DIAGNOSIS — I10 Essential (primary) hypertension: Secondary | ICD-10-CM | POA: Insufficient documentation

## 2020-08-17 DIAGNOSIS — E669 Obesity, unspecified: Secondary | ICD-10-CM | POA: Diagnosis not present

## 2020-08-17 DIAGNOSIS — D6869 Other thrombophilia: Secondary | ICD-10-CM | POA: Diagnosis not present

## 2020-08-17 DIAGNOSIS — Z6832 Body mass index (BMI) 32.0-32.9, adult: Secondary | ICD-10-CM | POA: Insufficient documentation

## 2020-08-17 LAB — CBC
HCT: 38.5 % — ABNORMAL LOW (ref 39.0–52.0)
Hemoglobin: 12.4 g/dL — ABNORMAL LOW (ref 13.0–17.0)
MCH: 29.9 pg (ref 26.0–34.0)
MCHC: 32.2 g/dL (ref 30.0–36.0)
MCV: 92.8 fL (ref 80.0–100.0)
Platelets: 125 10*3/uL — ABNORMAL LOW (ref 150–400)
RBC: 4.15 MIL/uL — ABNORMAL LOW (ref 4.22–5.81)
RDW: 13.9 % (ref 11.5–15.5)
WBC: 5.4 10*3/uL (ref 4.0–10.5)
nRBC: 0 % (ref 0.0–0.2)

## 2020-08-17 LAB — BASIC METABOLIC PANEL
Anion gap: 7 (ref 5–15)
BUN: 13 mg/dL (ref 8–23)
CO2: 29 mmol/L (ref 22–32)
Calcium: 8.7 mg/dL — ABNORMAL LOW (ref 8.9–10.3)
Chloride: 103 mmol/L (ref 98–111)
Creatinine, Ser: 0.88 mg/dL (ref 0.61–1.24)
GFR, Estimated: >60 mL/min (ref >60)
Glucose, Bld: 97 mg/dL (ref 70–99)
Potassium: 4.2 mmol/L (ref 3.5–5.1)
Sodium: 139 mmol/L (ref 135–145)

## 2020-08-17 NOTE — Patient Instructions (Signed)
TEE scheduled for Monday. March 28th  - Arrive at the [facility] and go to admitting at 930AM  - Do not eat or drink anything after midnight the night prior to your procedure.  - Take all your morning medication (except diabetic medications) with a sip of water prior to arrival.  - You will not be able to drive home after your procedure.

## 2020-08-17 NOTE — H&P (View-Only) (Signed)
Primary Care Physician: [name], MD Primary Cardiologist: Dr Debara Pickett Primary Electrophysiologist: none Watchman: Dr Burt Knack Referring Physician: Dr Rebecca Eaton is a 80 y.o. male with a history of coronary disease, hypertension, dyslipidemia, sleep apnea on CPAP, atrial fibrillation, chronic diastolic dysfunction, and h/o GI bleeding who presents for follow up in the [facility].  Patient is on Eliquis for a CHADS2VASC score of 4. He was hospitalized 02/2020 with symptomatic anemia with presenting Hgb 7.9, hemoccult positive. EGD showed no significant source of bleeding. He was seen by Dr Burt Knack 04/29/20 for Watchman consideration. CT 05/07/20 showed small LAA with broccoli morphology suitable for 20 mm Watchman FLX. He does report postural dizziness on standing which has been chronic.   On follow up today, patient is s/p Watchman implant with Dr Burt Knack on 07/09/20. Patient reports he has done well since the procedure. He denies CP or groin issues. He denies any bleeding issues on Eliquis.   Today, he denies symptoms of palpitations, chest pain, shortness of breath, orthopnea, PND, lower extremity edema, presyncope, syncope,  bleeding, or neurologic sequela. The patient is tolerating medications without difficulties and is otherwise without complaint today.    Atrial Fibrillation Risk Factors:  he does have symptoms or diagnosis of sleep apnea. he is compliant with CPAP therapy. he does not have a history of rheumatic fever.   he has a BMI of Body mass index is 32.99 kg/m.Marland Kitchen Filed Weights   08/17/20 0916  Weight: 98.4 kg    Family History  Problem Relation Age of Onset  . Heart failure Mother        also arthritis  . Stroke Father        also emphysema  . Heart failure Father      Atrial Fibrillation Management history:  Previous antiarrhythmic drugs: none Previous cardioversions: 01/2018 Previous ablations: none CHADS2VASC score:  4 Anticoagulation history: Eliquis   Past Medical History:  Diagnosis Date  . Anginal pain ([place])   . Anxiety   . Arthritis    generalized arthritis   . Atrial fibrillation ([place])   . CAD (coronary artery disease)   . Cancer ([place])    skin cancer removed left ear    . Complication of anesthesia   . Depression   . Headache   . History of echocardiogram 02/19/2009   EF 50-55%; LA mild-mod dilated;   . History of kidney stones   . History of nuclear stress test 02/19/2009   low risk, normal   . Hyperlipidemia   . Hypertension   . Obesity (BMI 30-39.9)   . OSA (obstructive sleep apnea)    severe with AHI 25/hr now on BiPAP  . PONV (postoperative nausea and vomiting)   . Retinal detachment    with proliferative vitreoretinopathy  . Shortness of breath    related to weight   . Sleep apnea   . Vertigo    Past Surgical History:  Procedure Laterality Date  . APPENDECTOMY    . BIOPSY  03/05/2020   Procedure: BIOPSY;  Surgeon: Rogene Houston, MD;  [facility];  Service: Endoscopy;;  . CARDIAC CATHETERIZATION  07/13/2005   noncritical CAD  . CARDIOVERSION N/A 01/29/2018   Procedure: CARDIOVERSION;  Surgeon: Dorothy Spark, MD;  Location: [facility];  Service: Cardiovascular;  Laterality: N/A;  . COLONOSCOPY WITH PROPOFOL N/A 03/06/2020   Procedure: COLONOSCOPY WITH PROPOFOL;  Surgeon: Harvel Quale, MD;  [facility];  Service: Gastroenterology;  Laterality: N/A;  . ESOPHAGOGASTRODUODENOSCOPY (EGD) WITH PROPOFOL N/A 03/05/2020   Procedure: ESOPHAGOGASTRODUODENOSCOPY (EGD) WITH PROPOFOL;  Surgeon: Rogene Houston, MD;  [facility];  Service: Endoscopy;  Laterality: N/A;  . EYE SURGERY    . FLEXIBLE SIGMOIDOSCOPY N/A 07/14/2015   Procedure: FLEXIBLE SIGMOIDOSCOPY;  Surgeon: Aviva Signs, MD;  [facility];  Service: Gastroenterology;  Laterality: N/A;  . GIVENS CAPSULE STUDY N/A 04/15/2020   Procedure: GIVENS CAPSULE STUDY;   Surgeon: Harvel Quale, MD;  [facility];  Service: Gastroenterology;  Laterality: N/A;  730  . JOINT REPLACEMENT    . LEFT ATRIAL APPENDAGE OCCLUSION N/A 07/09/2020   Procedure: LEFT ATRIAL APPENDAGE OCCLUSION;  Surgeon: Sherren Mocha, MD;  Location: [facility];  Service: Cardiovascular;  Laterality: N/A;  . NASAL SEPTOPLASTY W/ TURBINOPLASTY Bilateral 12/19/2017   Procedure: NASAL SEPTOPLASTY WITH TURBINATE REDUCTION;  Surgeon: [name], MD;  Location: [facility];  Service: ENT;  Laterality: Bilateral;  . NASAL SINUS SURGERY    . PARS PLANA VITRECTOMY Left 06/20/2017   Procedure: PARS PLANA VITRECTOMY WITH 25 GAUGE, REPAIR OF COMPLEX TRACTION RETINAL DETACHMENT, MEMBRANE PEEEL;  Surgeon: [name], MD;  Location: [facility];  Service: Ophthalmology;  Laterality: Left;  . POLYPECTOMY  03/06/2020   Procedure: POLYPECTOMY;  Surgeon: Harvel Quale, MD;  [facility];  Service: Gastroenterology;;  . REPAIR OF COMPLEX TRACTION RETINAL DETACHMENT Left 06/20/2017  . RETINAL DETACHMENT SURGERY Right 04/2011  . SCLERAL BUCKLE WITH POSSIBLE 25 GAUGE PARS PLANA VITRECTOMY Left 05/22/2017   Procedure: SCLERAL BUCKLE, LASER, GAS INJECTION LEFT EYE;  Surgeon: [name], MD;  Location: [facility];  Service: Ophthalmology;  Laterality: Left;  . SKIN CANCER EXCISION Left 2013   left ear skin ca removed  2 weeks ago - not completeely healed 08/15/11   . TEE WITHOUT CARDIOVERSION N/A 07/09/2020   Procedure: TRANSESOPHAGEAL ECHOCARDIOGRAM (TEE);  Surgeon: Sherren Mocha, MD;  Location: [facility];  Service: Cardiovascular;  Laterality: N/A;  . TONSILLECTOMY    . TOTAL KNEE ARTHROPLASTY  08/22/2011   Procedure: TOTAL KNEE ARTHROPLASTY;  Surgeon: Gearlean Alf, MD;  [facility];  Service: Orthopedics;  Laterality: Right;    Current Outpatient Medications  Medication Sig Dispense Refill  . ALPRAZolam (XANAX) 0.5 MG tablet Take 0.5  mg at bedtime by mouth. Anxiety     . Ascorbic Acid (VITAMIN C) 1000 MG tablet Take 1,000 mg by mouth daily.    . brimonidine (ALPHAGAN) 0.15 % ophthalmic solution Place 1 drop into the left eye 2 (two) times daily.  3  . Calcium Polycarbophil (FIBER-CAPS PO) Take 1 capsule by mouth daily.    . Cholecalciferol (VITAMIN D3) 50 MCG (2000 UT) TABS Take 2,000 Units by mouth daily.    Marland Kitchen docusate sodium (COLACE) 100 MG capsule Take 100 mg by mouth daily.    Marland Kitchen ELIQUIS 5 MG TABS tablet TAKE (1) TABLET TWICE DAILY. 180 tablet 1  . ferrous sulfate 325 (65 FE) MG tablet Take 325 mg by mouth daily with breakfast.    . finasteride (PROSCAR) 5 MG tablet Take 1 tablet (5 mg total) by mouth daily with supper. 90 tablet 3  . furosemide (LASIX) 40 MG tablet Take 1 tablet (40 mg total) by mouth daily. 30 tablet 6  . metoprolol tartrate (LOPRESSOR) 25 MG tablet Take 0.5 tablets (12.5 mg total) by mouth 2 (two) times daily. 60 tablet  1  . Misc Natural Products (TART CHERRY ADVANCED PO) Take 1 tablet by mouth daily.     . NON FORMULARY at bedtime. VPAP    . potassium chloride (KLOR-CON) 10 MEQ tablet Take 10 mEq by mouth daily.    . simvastatin (ZOCOR) 40 MG tablet Take 40 mg by mouth at bedtime.    . tamsulosin (FLOMAX) 0.4 MG CAPS capsule Take 1 capsule (0.4 mg total) by mouth in the morning and at bedtime. Take 1 capsule (0.4 mg) by mouth daily with supper (~1830) & take 1 capsule (0.4 mg) by mouth at bedtime. 180 capsule 3  . Zinc 50 MG TABS Take by mouth.     No current facility-administered medications for this encounter.    No Known Allergies  Social History   Socioeconomic History  . Marital status: Married    Spouse name: Not on file  . Number of children: 1  . Years of education: Not on file  . Highest education level: Not on file  Occupational History  . Not on file  Tobacco Use  . Smoking status: Never Smoker  . Smokeless tobacco: Never Used  Vaping Use  . Vaping Use: Never used   Substance and Sexual Activity  . Alcohol use: No  . Drug use: No  . Sexual activity: Not on file  Other Topics Concern  . Not on file  Social History Narrative  . Not on file   Social Determinants of Health   Financial Resource Strain: Not on file  Food Insecurity: Not on file  Transportation Needs: Not on file  Physical Activity: Not on file  Stress: Not on file  Social Connections: Not on file  Intimate Partner Violence: Not on file     ROS- All systems are reviewed and negative except as per the HPI above.  Physical Exam: Vitals:   08/17/20 0916  BP: 124/76  Pulse: (!) 54  Weight: 98.4 kg  Height: _0  (1.727 m)    GEN- The patient is a well appearing obese elderly male, alert and oriented x 3 today.   HEENT-head normocephalic, atraumatic, sclera clear, conjunctiva pink, hearing intact, trachea midline. Lungs- Clear to ausculation bilaterally, normal work of breathing Heart- irregular rate and rhythm, no murmurs, rubs or gallops  GI- soft, NT, ND, + BS Extremities- no clubbing, cyanosis, or edema MS- no significant deformity or atrophy Skin- no rash or lesion Psych- euthymic mood, full affect Neuro- strength and sensation are intact   Wt Readings from Last 3 Encounters:  08/17/20 98.4 kg  08/12/20 108.9 kg  07/09/20 108.9 kg    EKG today demonstrates  Afib with slow response  Vent. rate 54 BPM PR interval * ms QRS duration 92 ms QT/QTc 390/369 ms  Echo 03/04/20 demonstrated  1. Left ventricular ejection fraction, by estimation, is 60 to 65%. The  left ventricle has normal function. The left ventricle has no regional  wall motion abnormalities. Left ventricular diastolic parameters are  indeterminate.  2. Right ventricular systolic function is normal. The right ventricular  size is normal. There is normal pulmonary artery systolic pressure. The  estimated right ventricular systolic pressure is 06.2 mmHg.  3. Left atrial size was moderately  dilated.  4. The mitral valve is abnormal, mildly thickened and calcified. Mild  mitral valve regurgitation.  5. The aortic valve is tricuspid. There is moderate calcification of the  aortic valve. Aortic valve regurgitation is mild. Mild to moderate aortic  valve stenosis. Aortic regurgitation PHT  measures 725 msec. Aortic valve  mean gradient measures 11.3 mmHg.  Aortic valve Vmax measures 2.29 m/s.  6. The inferior vena cava is dilated in size with >50% respiratory  variability, suggesting right atrial pressure of 8 mmHg.   Epic records are reviewed at length today  CHA2DS2-VASc Score = 4  The patient's score is based upon: CHF History: No HTN History: Yes Diabetes History: No Stroke History: No Vascular Disease History: Yes Age Score: 2 Gender Score: 0      ASSESSMENT AND PLAN: 1. Persistent Atrial Fibrillation (ICD10:  I48.19) The patient's CHA2DS2-VASc score is 4, indicating a 4.8% annual risk of stroke.   S/p Watchman implant 07/09/20 Will check CBC/bmet today. Continue Eliquis 5 mg BID monotherapy.  TEE scheduled for 08/31/20, instructions given. If TEE criteria met, will transition Eliquis to Plavix monotherapy thorough 6 months.  Continue metoprolol 12.5 mg BID  2. Secondary Hypercoagulable State (ICD10:  D68.69) The patient is at significant risk for stroke/thromboembolism based upon his CHA2DS2-VASc Score of 4.  Continue Apixaban (Eliquis) for now.   3. Obesity Body mass index is 32.99 kg/m. Lifestyle modification was discussed and encouraged including regular physical activity and weight reduction.  4. Obstructive sleep apnea Encouraged compliance with CPAP therapy.  5. HTN Stable, no changes today.  6. Bradycardia Improved, asymptomatic.   Follow up in the AF clinic post TEE then Dr Burt Knack as scheduled.    [facility] [address] [place], [place] [address] [phone] 08/17/2020 9:28 AM

## 2020-08-17 NOTE — Progress Notes (Addendum)
Primary Care Physician: [name], MD Primary Cardiologist: Dr Debara Pickett Primary Electrophysiologist: none Watchman: Dr Burt Knack Referring Physician: Dr Rebecca Eaton is a 80 y.o. male with a history of coronary disease, hypertension, dyslipidemia, sleep apnea on CPAP, atrial fibrillation, chronic diastolic dysfunction, and h/o GI bleeding who presents for follow up in the [facility].  Patient is on Eliquis for a CHADS2VASC score of 4. He was hospitalized 02/2020 with symptomatic anemia with presenting Hgb 7.9, hemoccult positive. EGD showed no significant source of bleeding. He was seen by Dr Burt Knack 04/29/20 for Watchman consideration. CT 05/07/20 showed small LAA with broccoli morphology suitable for 20 mm Watchman FLX. He does report postural dizziness on standing which has been chronic.   On follow up today, patient is s/p Watchman implant with Dr Burt Knack on 07/09/20. Patient reports he has done well since the procedure. He denies CP or groin issues. He denies any bleeding issues on Eliquis.   Today, he denies symptoms of palpitations, chest pain, shortness of breath, orthopnea, PND, lower extremity edema, presyncope, syncope,  bleeding, or neurologic sequela. The patient is tolerating medications without difficulties and is otherwise without complaint today.    Atrial Fibrillation Risk Factors:  he does have symptoms or diagnosis of sleep apnea. he is compliant with CPAP therapy. he does not have a history of rheumatic fever.   he has a BMI of Body mass index is 32.99 kg/m.Marland Kitchen Filed Weights   08/17/20 0916  Weight: 98.4 kg    Family History  Problem Relation Age of Onset  . Heart failure Mother        also arthritis  . Stroke Father        also emphysema  . Heart failure Father      Atrial Fibrillation Management history:  Previous antiarrhythmic drugs: none Previous cardioversions: 01/2018 Previous ablations: none CHADS2VASC score:  4 Anticoagulation history: Eliquis   Past Medical History:  Diagnosis Date  . Anginal pain ([place])   . Anxiety   . Arthritis    generalized arthritis   . Atrial fibrillation ([place])   . CAD (coronary artery disease)   . Cancer ([place])    skin cancer removed left ear    . Complication of anesthesia   . Depression   . Headache   . History of echocardiogram 02/19/2009   EF 50-55%; LA mild-mod dilated;   . History of kidney stones   . History of nuclear stress test 02/19/2009   low risk, normal   . Hyperlipidemia   . Hypertension   . Obesity (BMI 30-39.9)   . OSA (obstructive sleep apnea)    severe with AHI 25/hr now on BiPAP  . PONV (postoperative nausea and vomiting)   . Retinal detachment    with proliferative vitreoretinopathy  . Shortness of breath    related to weight   . Sleep apnea   . Vertigo    Past Surgical History:  Procedure Laterality Date  . APPENDECTOMY    . BIOPSY  03/05/2020   Procedure: BIOPSY;  Surgeon: Rogene Houston, MD;  [facility];  Service: Endoscopy;;  . CARDIAC CATHETERIZATION  07/13/2005   noncritical CAD  . CARDIOVERSION N/A 01/29/2018   Procedure: CARDIOVERSION;  Surgeon: Dorothy Spark, MD;  Location: [place];  Service: Cardiovascular;  Laterality: N/A;  . COLONOSCOPY WITH PROPOFOL N/A 03/06/2020   Procedure: COLONOSCOPY WITH PROPOFOL;  Surgeon: Harvel Quale, MD;  [facility];  Service: Gastroenterology;  Laterality: N/A;  . ESOPHAGOGASTRODUODENOSCOPY (EGD) WITH PROPOFOL N/A 03/05/2020   Procedure: ESOPHAGOGASTRODUODENOSCOPY (EGD) WITH PROPOFOL;  Surgeon: Rogene Houston, MD;  [facility];  Service: Endoscopy;  Laterality: N/A;  . EYE SURGERY    . FLEXIBLE SIGMOIDOSCOPY N/A 07/14/2015   Procedure: FLEXIBLE SIGMOIDOSCOPY;  Surgeon: Aviva Signs, MD;  [facility];  Service: Gastroenterology;  Laterality: N/A;  . GIVENS CAPSULE STUDY N/A 04/15/2020   Procedure: GIVENS CAPSULE STUDY;   Surgeon: Harvel Quale, MD;  [facility];  Service: Gastroenterology;  Laterality: N/A;  730  . JOINT REPLACEMENT    . LEFT ATRIAL APPENDAGE OCCLUSION N/A 07/09/2020   Procedure: LEFT ATRIAL APPENDAGE OCCLUSION;  Surgeon: Sherren Mocha, MD;  Location: [facility];  Service: Cardiovascular;  Laterality: N/A;  . NASAL SEPTOPLASTY W/ TURBINOPLASTY Bilateral 12/19/2017   Procedure: NASAL SEPTOPLASTY WITH TURBINATE REDUCTION;  Surgeon: [name], MD;  Location: [facility];  Service: ENT;  Laterality: Bilateral;  . NASAL SINUS SURGERY    . PARS PLANA VITRECTOMY Left 06/20/2017   Procedure: PARS PLANA VITRECTOMY WITH 25 GAUGE, REPAIR OF COMPLEX TRACTION RETINAL DETACHMENT, MEMBRANE PEEEL;  Surgeon: [name], MD;  Location: [facility];  Service: Ophthalmology;  Laterality: Left;  . POLYPECTOMY  03/06/2020   Procedure: POLYPECTOMY;  Surgeon: Harvel Quale, MD;  [facility];  Service: Gastroenterology;;  . REPAIR OF COMPLEX TRACTION RETINAL DETACHMENT Left 06/20/2017  . RETINAL DETACHMENT SURGERY Right 04/2011  . SCLERAL BUCKLE WITH POSSIBLE 25 GAUGE PARS PLANA VITRECTOMY Left 05/22/2017   Procedure: SCLERAL BUCKLE, LASER, GAS INJECTION LEFT EYE;  Surgeon: [name], MD;  Location: [facility];  Service: Ophthalmology;  Laterality: Left;  . SKIN CANCER EXCISION Left 2013   left ear skin ca removed  2 weeks ago - not completeely healed 08/15/11   . TEE WITHOUT CARDIOVERSION N/A 07/09/2020   Procedure: TRANSESOPHAGEAL ECHOCARDIOGRAM (TEE);  Surgeon: Sherren Mocha, MD;  Location: [facility];  Service: Cardiovascular;  Laterality: N/A;  . TONSILLECTOMY    . TOTAL KNEE ARTHROPLASTY  08/22/2011   Procedure: TOTAL KNEE ARTHROPLASTY;  Surgeon: Gearlean Alf, MD;  Location: [facility];  Service: Orthopedics;  Laterality: Right;    Current Outpatient Medications  Medication Sig Dispense Refill  . ALPRAZolam (XANAX) 0.5 MG tablet Take 0.5  mg at bedtime by mouth. Anxiety     . Ascorbic Acid (VITAMIN C) 1000 MG tablet Take 1,000 mg by mouth daily.    . brimonidine (ALPHAGAN) 0.15 % ophthalmic solution Place 1 drop into the left eye 2 (two) times daily.  3  . Calcium Polycarbophil (FIBER-CAPS PO) Take 1 capsule by mouth daily.    . Cholecalciferol (VITAMIN D3) 50 MCG (2000 UT) TABS Take 2,000 Units by mouth daily.    Marland Kitchen docusate sodium (COLACE) 100 MG capsule Take 100 mg by mouth daily.    Marland Kitchen ELIQUIS 5 MG TABS tablet TAKE (1) TABLET TWICE DAILY. 180 tablet 1  . ferrous sulfate 325 (65 FE) MG tablet Take 325 mg by mouth daily with breakfast.    . finasteride (PROSCAR) 5 MG tablet Take 1 tablet (5 mg total) by mouth daily with supper. 90 tablet 3  . furosemide (LASIX) 40 MG tablet Take 1 tablet (40 mg total) by mouth daily. 30 tablet 6  . metoprolol tartrate (LOPRESSOR) 25 MG tablet Take 0.5 tablets (12.5 mg total) by mouth 2 (two) times daily. 60 tablet  1  . Misc Natural Products (TART CHERRY ADVANCED PO) Take 1 tablet by mouth daily.     . NON FORMULARY at bedtime. VPAP    . potassium chloride (KLOR-CON) 10 MEQ tablet Take 10 mEq by mouth daily.    . simvastatin (ZOCOR) 40 MG tablet Take 40 mg by mouth at bedtime.    . tamsulosin (FLOMAX) 0.4 MG CAPS capsule Take 1 capsule (0.4 mg total) by mouth in the morning and at bedtime. Take 1 capsule (0.4 mg) by mouth daily with supper (~1830) & take 1 capsule (0.4 mg) by mouth at bedtime. 180 capsule 3  . Zinc 50 MG TABS Take by mouth.     No current facility-administered medications for this encounter.    No Known Allergies  Social History   Socioeconomic History  . Marital status: Married    Spouse name: Not on file  . Number of children: 1  . Years of education: Not on file  . Highest education level: Not on file  Occupational History  . Not on file  Tobacco Use  . Smoking status: Never Smoker  . Smokeless tobacco: Never Used  Vaping Use  . Vaping Use: Never used   Substance and Sexual Activity  . Alcohol use: No  . Drug use: No  . Sexual activity: Not on file  Other Topics Concern  . Not on file  Social History Narrative  . Not on file   Social Determinants of Health   Financial Resource Strain: Not on file  Food Insecurity: Not on file  Transportation Needs: Not on file  Physical Activity: Not on file  Stress: Not on file  Social Connections: Not on file  Intimate Partner Violence: Not on file     ROS- All systems are reviewed and negative except as per the HPI above.  Physical Exam: Vitals:   08/17/20 0916  BP: 124/76  Pulse: (!) 54  Weight: 98.4 kg  Height: _0  (1.727 m)    GEN- The patient is a well appearing obese elderly male, alert and oriented x 3 today.   HEENT-head normocephalic, atraumatic, sclera clear, conjunctiva pink, hearing intact, trachea midline. Lungs- Clear to ausculation bilaterally, normal work of breathing Heart- irregular rate and rhythm, no murmurs, rubs or gallops  GI- soft, NT, ND, + BS Extremities- no clubbing, cyanosis, or edema MS- no significant deformity or atrophy Skin- no rash or lesion Psych- euthymic mood, full affect Neuro- strength and sensation are intact   Wt Readings from Last 3 Encounters:  08/17/20 98.4 kg  08/12/20 108.9 kg  07/09/20 108.9 kg    EKG today demonstrates  Afib with slow response  Vent. rate 54 BPM PR interval * ms QRS duration 92 ms QT/QTc 390/369 ms  Echo 03/04/20 demonstrated  1. Left ventricular ejection fraction, by estimation, is 60 to 65%. The  left ventricle has normal function. The left ventricle has no regional  wall motion abnormalities. Left ventricular diastolic parameters are  indeterminate.  2. Right ventricular systolic function is normal. The right ventricular  size is normal. There is normal pulmonary artery systolic pressure. The  estimated right ventricular systolic pressure is 06.2 mmHg.  3. Left atrial size was moderately  dilated.  4. The mitral valve is abnormal, mildly thickened and calcified. Mild  mitral valve regurgitation.  5. The aortic valve is tricuspid. There is moderate calcification of the  aortic valve. Aortic valve regurgitation is mild. Mild to moderate aortic  valve stenosis. Aortic regurgitation PHT  measures 725 msec. Aortic valve  mean gradient measures 11.3 mmHg.  Aortic valve Vmax measures 2.29 m/s.  6. The inferior vena cava is dilated in size with >50% respiratory  variability, suggesting right atrial pressure of 8 mmHg.   Epic records are reviewed at length today  CHA2DS2-VASc Score = 4  The patient's score is based upon: CHF History: No HTN History: Yes Diabetes History: No Stroke History: No Vascular Disease History: Yes Age Score: 2 Gender Score: 0      ASSESSMENT AND PLAN: 1. Persistent Atrial Fibrillation (ICD10:  I48.19) The patient's CHA2DS2-VASc score is 4, indicating a 4.8% annual risk of stroke.   S/p Watchman implant 07/09/20 Will check CBC/bmet today. Continue Eliquis 5 mg BID monotherapy.  TEE scheduled for 08/31/20, instructions given. If TEE criteria met, will transition Eliquis to Plavix monotherapy thorough 6 months.  Continue metoprolol 12.5 mg BID  2. Secondary Hypercoagulable State (ICD10:  D68.69) The patient is at significant risk for stroke/thromboembolism based upon his CHA2DS2-VASc Score of 4.  Continue Apixaban (Eliquis) for now.   3. Obesity Body mass index is 32.99 kg/m. Lifestyle modification was discussed and encouraged including regular physical activity and weight reduction.  4. Obstructive sleep apnea Encouraged compliance with CPAP therapy.  5. HTN Stable, no changes today.  6. Bradycardia Improved, asymptomatic.   Follow up in the AF clinic post TEE then Dr Burt Knack as scheduled.    [facility] [address] [place], [place] [address] [phone] 08/17/2020 9:28 AM

## 2020-08-29 ENCOUNTER — Other Ambulatory Visit (HOSPITAL_COMMUNITY)
Admission: RE | Admit: 2020-08-29 | Discharge: 2020-08-29 | Disposition: A | Discharge status: Home / Self Care | Payer: Medicare Other | Source: Ambulatory Visit | Attending: Cardiovascular Disease | Admitting: Cardiovascular Disease

## 2020-08-29 DIAGNOSIS — Z01812 Encounter for preprocedural laboratory examination: Secondary | ICD-10-CM | POA: Insufficient documentation

## 2020-08-29 DIAGNOSIS — Z20822 Contact with and (suspected) exposure to covid-19: Secondary | ICD-10-CM | POA: Diagnosis not present

## 2020-08-29 LAB — SARS CORONAVIRUS 2 (TAT 6-24 HRS): SARS Coronavirus 2: NEGATIVE

## 2020-08-31 ENCOUNTER — Other Ambulatory Visit: Payer: Self-pay

## 2020-08-31 ENCOUNTER — Encounter (HOSPITAL_COMMUNITY)
Admission: RE | Disposition: A | Discharge status: Home / Self Care | Payer: Self-pay | Source: Home / Self Care | Attending: Cardiovascular Disease

## 2020-08-31 ENCOUNTER — Encounter (HOSPITAL_COMMUNITY): Payer: Self-pay | Admitting: Cardiovascular Disease

## 2020-08-31 ENCOUNTER — Ambulatory Visit (HOSPITAL_COMMUNITY): Payer: Medicare Other | Admitting: Certified Registered Nurse Anesthetist

## 2020-08-31 ENCOUNTER — Ambulatory Visit (HOSPITAL_COMMUNITY)
Admission: RE | Admit: 2020-08-31 | Discharge: 2020-08-31 | Disposition: A | Discharge status: Home / Self Care | Payer: Medicare Other | Attending: Cardiovascular Disease | Admitting: Cardiovascular Disease

## 2020-08-31 ENCOUNTER — Ambulatory Visit (HOSPITAL_BASED_OUTPATIENT_CLINIC_OR_DEPARTMENT_OTHER): Payer: Medicare Other

## 2020-08-31 DIAGNOSIS — I4819 Other persistent atrial fibrillation: Secondary | ICD-10-CM | POA: Insufficient documentation

## 2020-08-31 DIAGNOSIS — Z7901 Long term (current) use of anticoagulants: Secondary | ICD-10-CM | POA: Diagnosis not present

## 2020-08-31 DIAGNOSIS — I313 Pericardial effusion (noninflammatory): Secondary | ICD-10-CM

## 2020-08-31 DIAGNOSIS — G4733 Obstructive sleep apnea (adult) (pediatric): Secondary | ICD-10-CM | POA: Diagnosis not present

## 2020-08-31 DIAGNOSIS — R001 Bradycardia, unspecified: Secondary | ICD-10-CM | POA: Insufficient documentation

## 2020-08-31 DIAGNOSIS — Z79899 Other long term (current) drug therapy: Secondary | ICD-10-CM | POA: Diagnosis not present

## 2020-08-31 DIAGNOSIS — Z6832 Body mass index (BMI) 32.0-32.9, adult: Secondary | ICD-10-CM | POA: Diagnosis not present

## 2020-08-31 DIAGNOSIS — I2510 Atherosclerotic heart disease of native coronary artery without angina pectoris: Secondary | ICD-10-CM | POA: Diagnosis not present

## 2020-08-31 DIAGNOSIS — I340 Nonrheumatic mitral (valve) insufficiency: Secondary | ICD-10-CM | POA: Diagnosis not present

## 2020-08-31 DIAGNOSIS — I10 Essential (primary) hypertension: Secondary | ICD-10-CM | POA: Diagnosis not present

## 2020-08-31 DIAGNOSIS — I110 Hypertensive heart disease with heart failure: Secondary | ICD-10-CM | POA: Diagnosis not present

## 2020-08-31 DIAGNOSIS — I5031 Acute diastolic (congestive) heart failure: Secondary | ICD-10-CM | POA: Diagnosis not present

## 2020-08-31 DIAGNOSIS — I083 Combined rheumatic disorders of mitral, aortic and tricuspid valves: Secondary | ICD-10-CM | POA: Diagnosis not present

## 2020-08-31 DIAGNOSIS — D6869 Other thrombophilia: Secondary | ICD-10-CM | POA: Insufficient documentation

## 2020-08-31 DIAGNOSIS — E669 Obesity, unspecified: Secondary | ICD-10-CM | POA: Insufficient documentation

## 2020-08-31 HISTORY — PX: TEE WITHOUT CARDIOVERSION: SHX5443

## 2020-08-31 LAB — ECHO TEE
AR max vel: 1.92 cm2
AV Area VTI: 1.8 cm2
AV Area mean vel: 1.92 cm2
AV Mean grad: 11.5 mmHg
AV Peak grad: 23.7 mmHg
Ao pk vel: 2.44 m/s
P 1/2 time: 668 msec

## 2020-08-31 SURGERY — ECHOCARDIOGRAM, TRANSESOPHAGEAL
Anesthesia: Monitor Anesthesia Care

## 2020-08-31 MED ORDER — PROPOFOL 10 MG/ML IV BOLUS
INTRAVENOUS | Status: DC | PRN
  Administered 2020-08-31 (×2): 20 mg via INTRAVENOUS

## 2020-08-31 MED ORDER — BUTAMBEN-TETRACAINE-BENZOCAINE 2-2-14 % EX AERO
INHALATION_SPRAY | CUTANEOUS | Status: DC | PRN
  Administered 2020-08-31: 2 via TOPICAL

## 2020-08-31 MED ORDER — SODIUM CHLORIDE 0.9 % IV SOLN: INTRAVENOUS | Status: DC | PRN

## 2020-08-31 MED ORDER — SODIUM CHLORIDE 0.9 % IV SOLN: INTRAVENOUS | Status: DC

## 2020-08-31 MED ORDER — PROPOFOL 500 MG/50ML IV EMUL
INTRAVENOUS | Status: DC | PRN
  Administered 2020-08-31: 125 ug/kg/min via INTRAVENOUS

## 2020-08-31 NOTE — CV Procedure (Signed)
    TRANSESOPHAGEAL ECHOCARDIOGRAM   NAME:  Paul Lam    MRN: [id number] DOB:  08-04-1940    ADMIT DATE: 08/31/2020  INDICATIONS: Post Watchman FLX  PROCEDURE:   Informed consent was obtained prior to the procedure. The risks, benefits and alternatives for the procedure were discussed and the patient comprehended these risks.  Risks include, but are not limited to, cough, sore throat, vomiting, nausea, somnolence, esophageal and stomach trauma or perforation, bleeding, low blood pressure, aspiration, pneumonia, infection, trauma to the teeth and death.    Procedural time out performed. The oropharynx was anesthetized with topical 1% benzocaine.    Anesthesia was administered by Dr. Ola Spurr.  The patient was administered 267 mg of propofol and 0 mg of lidocaine to achieve and maintain moderate conscious sedation.  The patient's heart rate, blood pressure, and oxygen saturation are monitored continuously during the procedure. The period of conscious sedation is 15 minutes, of which I was present face-to-face 100% of this time.   The transesophageal probe was inserted in the esophagus and stomach without difficulty and multiple views were obtained.   COMPLICATIONS:    There were no immediate complications.  KEY FINDINGS:  1. 20 mm Watchman FLX with no residual leak.  2. Mild AS.  3. Normal LVEF 55-60%. 4. Full report to follow. 5. Further management per primary team.   Addison Naegeli. Audie Box, MD, [place]  [address] [place], [place] [address] [phone]  10:34 AM

## 2020-08-31 NOTE — Transfer of Care (Signed)
Immediate Anesthesia Transfer of Care Note  Patient: Paul Lam  Procedure(s) Performed: TRANSESOPHAGEAL ECHOCARDIOGRAM (TEE) (N/A )  Patient Location: Endoscopy Unit  Anesthesia Type:MAC  Level of Consciousness: drowsy and patient cooperative  Airway & Oxygen Therapy: Patient Spontanous Breathing and Patient connected to face mask oxygen  Post-op Assessment: Report given to RN and Post -op Vital signs reviewed and stable  Post vital signs: Reviewed and stable  Last Vitals:  Vitals Value Taken Time  BP 109/57   Temp    Pulse 58   Resp 27   SpO2 100     Last Pain:  Vitals:   08/31/20 0932  TempSrc: Oral  PainSc: 0-No pain         Complications: No complications documented.

## 2020-08-31 NOTE — Anesthesia Preprocedure Evaluation (Addendum)
Anesthesia Evaluation  Patient identified by MRN, date of birth, ID band Patient awake    Reviewed: Allergy & Precautions, H&P , NPO status , Patient's Chart, lab work & pertinent test results, reviewed documented beta blocker date and time   History of Anesthesia Complications (+) PONV  Airway Mallampati: II  TM Distance: >3 FB Neck ROM: Full    Dental no notable dental hx. (+) Teeth Intact, Dental Advisory Given   Pulmonary shortness of breath, sleep apnea ,    Pulmonary exam normal breath sounds clear to auscultation       Cardiovascular hypertension, Pt. on medications and Pt. on home beta blockers + CAD and +CHF  + dysrhythmias Atrial Fibrillation  Rhythm:Regular Rate:Normal     Neuro/Psych  Headaches, Anxiety Depression negative psych ROS   GI/Hepatic negative GI ROS, Neg liver ROS,   Endo/Other  negative endocrine ROS  Renal/GU negative Renal ROS  negative genitourinary   Musculoskeletal  (+) Arthritis , Osteoarthritis,    Abdominal   Peds  Hematology  (+) Blood dyscrasia, anemia ,   Anesthesia Other Findings   Reproductive/Obstetrics negative OB ROS                           Anesthesia Physical Anesthesia Plan  ASA: III  Anesthesia Plan: MAC   Post-op Pain Management:    Induction: Intravenous  PONV Risk Score and Plan: 2 and Propofol infusion and Treatment may vary due to age or medical condition  Airway Management Planned: Nasal Cannula  Additional Equipment:   Intra-op Plan:   Post-operative Plan:   Informed Consent: I have reviewed the patients History and Physical, chart, labs and discussed the procedure including the risks, benefits and alternatives for the proposed anesthesia with the patient or authorized representative who has indicated his/her understanding and acceptance.     Dental advisory given  Plan Discussed with: CRNA  Anesthesia Plan Comments:          Anesthesia Quick Evaluation

## 2020-08-31 NOTE — Discharge Instructions (Signed)

## 2020-08-31 NOTE — Anesthesia Postprocedure Evaluation (Signed)
Anesthesia Post Note  Patient: Paul Lam  Procedure(s) Performed: TRANSESOPHAGEAL ECHOCARDIOGRAM (TEE) (N/A )     Patient location during evaluation: Endoscopy Anesthesia Type: MAC Level of consciousness: awake and alert Pain management: pain level controlled Vital Signs Assessment: post-procedure vital signs reviewed and stable Respiratory status: spontaneous breathing, nonlabored ventilation and respiratory function stable Cardiovascular status: stable and blood pressure returned to baseline Postop Assessment: no apparent nausea or vomiting Anesthetic complications: no   No complications documented.  Last Vitals:  Vitals:   08/31/20 1055 08/31/20 1105  BP:  139/78  Pulse: 68 (!) 47  Resp: (!) 24 14  Temp:    SpO2: 99% 100%    Last Pain:  Vitals:   08/31/20 1105  TempSrc:   PainSc: 0-No pain                 Yamen Castrogiovanni,W. EDMOND

## 2020-08-31 NOTE — Interval H&P Note (Signed)
History and Physical Interval Note:  08/31/2020 9:24 AM  Paul Lam  has presented today for surgery, with the diagnosis of POST IMPLANT.  The various methods of treatment have been discussed with the patient and family. After consideration of risks, benefits and other options for treatment, the patient has consented to  Procedure(s): TRANSESOPHAGEAL ECHOCARDIOGRAM (TEE) (N/A) as a surgical intervention.  The patient's history has been reviewed, patient examined, no change in status, stable for surgery.  I have reviewed the patient's chart and labs.  Questions were answered to the patient's satisfaction.    NPO for TEE for post watchman follow-up. No issues.   [place] T. Audie Box, MD, [place]  [address] [place], [place] [address] [phone]  9:24 AM

## 2020-08-31 NOTE — Progress Notes (Signed)
  Echocardiogram Echocardiogram Transesophageal has been performed.  Paul Lam 08/31/2020, 11:11 AM

## 2020-08-31 NOTE — Anesthesia Procedure Notes (Signed)
Procedure Name: MAC Date/Time: 08/31/2020 10:13 AM Performed by: Kathryne Hitch, CRNA Pre-anesthesia Checklist: Patient identified, Emergency Drugs available, Suction available and Patient being monitored Patient Re-evaluated:Patient Re-evaluated prior to induction Oxygen Delivery Method: Nasal cannula Preoxygenation: Pre-oxygenation with 100% oxygen Induction Type: IV induction Placement Confirmation: positive ETCO2 Dental Injury: Teeth and Oropharynx as per pre-operative assessment

## 2020-09-01 ENCOUNTER — Encounter (HOSPITAL_COMMUNITY): Payer: Self-pay | Admitting: Cardiovascular Disease

## 2020-09-02 ENCOUNTER — Ambulatory Visit (HOSPITAL_COMMUNITY)
Admission: RE | Admit: 2020-09-02 | Discharge: 2020-09-02 | Disposition: A | Discharge status: Home / Self Care | Payer: Medicare Other | Source: Ambulatory Visit | Attending: Physician Assistant | Admitting: Physician Assistant

## 2020-09-02 ENCOUNTER — Other Ambulatory Visit: Payer: Self-pay

## 2020-09-02 ENCOUNTER — Encounter (HOSPITAL_COMMUNITY): Payer: Self-pay | Admitting: Physician Assistant

## 2020-09-02 VITALS — BP 130/78 | HR 64 | Ht 68.0 in | Wt 222.8 lb

## 2020-09-02 DIAGNOSIS — Z95810 Presence of automatic (implantable) cardiac defibrillator: Secondary | ICD-10-CM | POA: Diagnosis not present

## 2020-09-02 DIAGNOSIS — Z7901 Long term (current) use of anticoagulants: Secondary | ICD-10-CM | POA: Diagnosis not present

## 2020-09-02 DIAGNOSIS — G4733 Obstructive sleep apnea (adult) (pediatric): Secondary | ICD-10-CM | POA: Diagnosis not present

## 2020-09-02 DIAGNOSIS — D6869 Other thrombophilia: Secondary | ICD-10-CM

## 2020-09-02 DIAGNOSIS — E669 Obesity, unspecified: Secondary | ICD-10-CM | POA: Insufficient documentation

## 2020-09-02 DIAGNOSIS — I10 Essential (primary) hypertension: Secondary | ICD-10-CM | POA: Diagnosis not present

## 2020-09-02 DIAGNOSIS — Z8719 Personal history of other diseases of the digestive system: Secondary | ICD-10-CM | POA: Insufficient documentation

## 2020-09-02 DIAGNOSIS — Z9989 Dependence on other enabling machines and devices: Secondary | ICD-10-CM | POA: Insufficient documentation

## 2020-09-02 DIAGNOSIS — Z79899 Other long term (current) drug therapy: Secondary | ICD-10-CM | POA: Diagnosis not present

## 2020-09-02 DIAGNOSIS — I4891 Unspecified atrial fibrillation: Secondary | ICD-10-CM | POA: Diagnosis not present

## 2020-09-02 DIAGNOSIS — Z96651 Presence of right artificial knee joint: Secondary | ICD-10-CM | POA: Insufficient documentation

## 2020-09-02 DIAGNOSIS — I4819 Other persistent atrial fibrillation: Secondary | ICD-10-CM | POA: Diagnosis not present

## 2020-09-02 DIAGNOSIS — Z6833 Body mass index (BMI) 33.0-33.9, adult: Secondary | ICD-10-CM | POA: Diagnosis not present

## 2020-09-02 DIAGNOSIS — E7849 Other hyperlipidemia: Secondary | ICD-10-CM | POA: Diagnosis not present

## 2020-09-02 DIAGNOSIS — E785 Hyperlipidemia, unspecified: Secondary | ICD-10-CM | POA: Diagnosis not present

## 2020-09-02 DIAGNOSIS — E6609 Other obesity due to excess calories: Secondary | ICD-10-CM | POA: Diagnosis not present

## 2020-09-02 MED ORDER — CLOPIDOGREL BISULFATE 75 MG PO TABS: 75.0000 mg | ORAL_TABLET | Freq: Every day | ORAL | 5 refills | Status: DC

## 2020-09-02 NOTE — Patient Instructions (Signed)
Stop Eliquis after tonights dose  Tomorrow Start Plavix 75mg  once a day

## 2020-09-02 NOTE — Progress Notes (Signed)
Primary Care Physician: [name], MD Primary Cardiologist: Dr Debara Pickett Primary Electrophysiologist: none Watchman: Dr Burt Knack Referring Physician: Dr Rebecca Eaton is a 80 y.o. male with a history of coronary disease, hypertension, dyslipidemia, sleep apnea on CPAP, atrial fibrillation, chronic diastolic dysfunction, and h/o GI bleeding who presents for follow up in the [facility].  Patient is on Eliquis for a CHADS2VASC score of 4. He was hospitalized 02/2020 with symptomatic anemia with presenting Hgb 7.9, hemoccult positive. EGD showed no significant source of bleeding. He was seen by Dr Burt Knack 04/29/20 for Watchman consideration. CT 05/07/20 showed small LAA with broccoli morphology suitable for 20 mm Watchman FLX. He does report postural dizziness on standing which has been chronic. Patient is s/p Watchman implant with Dr Burt Knack on 07/09/20.    On follow up today, patient reports he has done well since his last visit. TEE showed no DRT or residual leak. He does have facial bruising on Eliquis from his CPAP straps. No other bleeding issues.   Today, he denies symptoms of palpitations, chest pain, shortness of breath, orthopnea, PND, lower extremity edema, presyncope, syncope,  bleeding, or neurologic sequela. The patient is tolerating medications without difficulties and is otherwise without complaint today.    Atrial Fibrillation Risk Factors:  he does have symptoms or diagnosis of sleep apnea. he is compliant with CPAP therapy. he does not have a history of rheumatic fever.   he has a BMI of Body mass index is 33.88 kg/m.Marland Kitchen Filed Weights   09/02/20 1037  Weight: 101.1 kg    Family History  Problem Relation Age of Onset  . Heart failure Mother        also arthritis  . Stroke Father        also emphysema  . Heart failure Father      Atrial Fibrillation Management history:  Previous antiarrhythmic drugs: none Previous cardioversions:  01/2018 Previous ablations: none CHADS2VASC score: 4 Anticoagulation history: Eliquis   Past Medical History:  Diagnosis Date  . Anginal pain ([place])   . Anxiety   . Arthritis    generalized arthritis   . Atrial fibrillation ([place])   . CAD (coronary artery disease)   . Cancer ([place])    skin cancer removed left ear    . Complication of anesthesia   . Depression   . Headache   . History of echocardiogram 02/19/2009   EF 50-55%; LA mild-mod dilated;   . History of kidney stones   . History of nuclear stress test 02/19/2009   low risk, normal   . Hyperlipidemia   . Hypertension   . Obesity (BMI 30-39.9)   . OSA (obstructive sleep apnea)    severe with AHI 25/hr now on BiPAP  . PONV (postoperative nausea and vomiting)   . Retinal detachment    with proliferative vitreoretinopathy  . Shortness of breath    related to weight   . Sleep apnea   . Vertigo    Past Surgical History:  Procedure Laterality Date  . APPENDECTOMY    . BIOPSY  03/05/2020   Procedure: BIOPSY;  Surgeon: Rogene Houston, MD;  [facility];  Service: Endoscopy;;  . CARDIAC CATHETERIZATION  07/13/2005   noncritical CAD  . CARDIOVERSION N/A 01/29/2018   Procedure: CARDIOVERSION;  Surgeon: Dorothy Spark, MD;  Location: [facility];  Service: Cardiovascular;  Laterality: N/A;  . COLONOSCOPY WITH PROPOFOL N/A 03/06/2020   Procedure: COLONOSCOPY WITH PROPOFOL;  Surgeon: Montez Morita, Quillian Quince, MD;  [facility];  Service: Gastroenterology;  Laterality: N/A;  . ESOPHAGOGASTRODUODENOSCOPY (EGD) WITH PROPOFOL N/A 03/05/2020   Procedure: ESOPHAGOGASTRODUODENOSCOPY (EGD) WITH PROPOFOL;  Surgeon: Rogene Houston, MD;  [facility];  Service: Endoscopy;  Laterality: N/A;  . EYE SURGERY    . FLEXIBLE SIGMOIDOSCOPY N/A 07/14/2015   Procedure: FLEXIBLE SIGMOIDOSCOPY;  Surgeon: Aviva Signs, MD;  [facility];  Service: Gastroenterology;  Laterality: N/A;  . GIVENS CAPSULE STUDY  N/A 04/15/2020   Procedure: GIVENS CAPSULE STUDY;  Surgeon: Harvel Quale, MD;  [facility];  Service: Gastroenterology;  Laterality: N/A;  730  . JOINT REPLACEMENT    . LEFT ATRIAL APPENDAGE OCCLUSION N/A 07/09/2020   Procedure: LEFT ATRIAL APPENDAGE OCCLUSION;  Surgeon: Sherren Mocha, MD;  Location: [facility];  Service: Cardiovascular;  Laterality: N/A;  . NASAL SEPTOPLASTY W/ TURBINOPLASTY Bilateral 12/19/2017   Procedure: NASAL SEPTOPLASTY WITH TURBINATE REDUCTION;  Surgeon: [name], MD;  Location: [facility];  Service: ENT;  Laterality: Bilateral;  . NASAL SINUS SURGERY    . PARS PLANA VITRECTOMY Left 06/20/2017   Procedure: PARS PLANA VITRECTOMY WITH 25 GAUGE, REPAIR OF COMPLEX TRACTION RETINAL DETACHMENT, MEMBRANE PEEEL;  Surgeon: [name], MD;  Location: [facility];  Service: Ophthalmology;  Laterality: Left;  . POLYPECTOMY  03/06/2020   Procedure: POLYPECTOMY;  Surgeon: Harvel Quale, MD;  [facility];  Service: Gastroenterology;;  . REPAIR OF COMPLEX TRACTION RETINAL DETACHMENT Left 06/20/2017  . RETINAL DETACHMENT SURGERY Right 04/2011  . SCLERAL BUCKLE WITH POSSIBLE 25 GAUGE PARS PLANA VITRECTOMY Left 05/22/2017   Procedure: SCLERAL BUCKLE, LASER, GAS INJECTION LEFT EYE;  Surgeon: [name], MD;  Location: [facility];  Service: Ophthalmology;  Laterality: Left;  . SKIN CANCER EXCISION Left 2013   left ear skin ca removed  2 weeks ago - not completeely healed 08/15/11   . TEE WITHOUT CARDIOVERSION N/A 07/09/2020   Procedure: TRANSESOPHAGEAL ECHOCARDIOGRAM (TEE);  Surgeon: Sherren Mocha, MD;  Location: [facility];  Service: Cardiovascular;  Laterality: N/A;  . TEE WITHOUT CARDIOVERSION N/A 08/31/2020   Procedure: TRANSESOPHAGEAL ECHOCARDIOGRAM (TEE);  Surgeon: Geralynn Rile, MD;  Location: [facility];  Service: Cardiovascular;  Laterality: N/A;  . TONSILLECTOMY    . TOTAL KNEE ARTHROPLASTY   08/22/2011   Procedure: TOTAL KNEE ARTHROPLASTY;  Surgeon: Gearlean Alf, MD;  Location: [facility];  Service: Orthopedics;  Laterality: Right;    Current Outpatient Medications  Medication Sig Dispense Refill  . ALPRAZolam (XANAX) 0.5 MG tablet Take 0.5 mg at bedtime by mouth. Anxiety     . Ascorbic Acid (VITAMIN C) 1000 MG tablet Take 1,000 mg by mouth daily.    . brimonidine (ALPHAGAN) 0.15 % ophthalmic solution Place 1 drop into the left eye 2 (two) times daily.  3  . Calcium Polycarbophil (FIBER-CAPS PO) Take 1 capsule by mouth daily.    . carboxymethylcellulose (REFRESH PLUS) 0.5 % SOLN Place 1 drop into the right eye daily as needed (dry eyes).    . Cholecalciferol (VITAMIN D3) 50 MCG (2000 UT) TABS Take 2,000 Units by mouth daily.    . clopidogrel (PLAVIX) 75 MG tablet Take 1 tablet (75 mg total) by mouth daily. 30 tablet 5  . docusate sodium (COLACE) 100 MG capsule Take 100 mg by mouth 2 (two) times daily.    . ferrous sulfate 325 (65 FE) MG tablet Take 325 mg  by mouth daily with breakfast.    . finasteride (PROSCAR) 5 MG tablet Take 1 tablet (5 mg total) by mouth daily with supper. 90 tablet 3  . furosemide (LASIX) 40 MG tablet Take 1 tablet (40 mg total) by mouth daily. 30 tablet 6  . metoprolol tartrate (LOPRESSOR) 25 MG tablet Take 0.5 tablets (12.5 mg total) by mouth 2 (two) times daily. 60 tablet 1  . Misc Natural Products (TART CHERRY ADVANCED PO) Take 1 tablet by mouth daily.     . NON FORMULARY at bedtime. VPAP    . potassium chloride (KLOR-CON) 10 MEQ tablet Take 10 mEq by mouth daily.    . simvastatin (ZOCOR) 40 MG tablet Take 40 mg by mouth at bedtime.    . tamsulosin (FLOMAX) 0.4 MG CAPS capsule Take 1 capsule (0.4 mg total) by mouth in the morning and at bedtime. Take 1 capsule (0.4 mg) by mouth daily with supper (~1830) & take 1 capsule (0.4 mg) by mouth at bedtime. 180 capsule 3  . Zinc 50 MG TABS Take 50 mg by mouth daily.     No current facility-administered  medications for this encounter.    No Known Allergies  Social History   Socioeconomic History  . Marital status: Married    Spouse name: Not on file  . Number of children: 1  . Years of education: Not on file  . Highest education level: Not on file  Occupational History  . Not on file  Tobacco Use  . Smoking status: Never Smoker  . Smokeless tobacco: Never Used  Vaping Use  . Vaping Use: Never used  Substance and Sexual Activity  . Alcohol use: No  . Drug use: No  . Sexual activity: Not on file  Other Topics Concern  . Not on file  Social History Narrative  . Not on file   Social Determinants of Health   Financial Resource Strain: Not on file  Food Insecurity: Not on file  Transportation Needs: Not on file  Physical Activity: Not on file  Stress: Not on file  Social Connections: Not on file  Intimate Partner Violence: Not on file     ROS- All systems are reviewed and negative except as per the HPI above.  Physical Exam: Vitals:   09/02/20 1037  BP: 130/78  Pulse: 64  Weight: 101.1 kg  Height: 5\' 8"  (1.727 m)    GEN- The patient is a well appearing obese eldrely male, alert and oriented x 3 today.   HEENT-head normocephalic, atraumatic, sclera clear, conjunctiva pink, hearing intact, trachea midline. Lungs- Clear to ausculation bilaterally, normal work of breathing Heart- irregular rate and rhythm, no murmurs, rubs or gallops  GI- soft, NT, ND, + BS Extremities- no clubbing, cyanosis, or edema MS- no significant deformity or atrophy Skin- no rash or lesion Psych- euthymic mood, full affect Neuro- strength and sensation are intact   Wt Readings from Last 3 Encounters:  09/02/20 101.1 kg  08/17/20 98.4 kg  08/12/20 108.9 kg    EKG today demonstrates  Afib Vent. rate 64 BPM PR interval * ms QRS duration 94 ms QT/QTcB 388/400 ms  Echo 03/04/20 demonstrated  1. Left ventricular ejection fraction, by estimation, is 60 to 65%. The  left ventricle  has normal function. The left ventricle has no regional  wall motion abnormalities. Left ventricular diastolic parameters are  indeterminate.  2. Right ventricular systolic function is normal. The right ventricular  size is normal. There is normal pulmonary artery  systolic pressure. The  estimated right ventricular systolic pressure is 27.0 mmHg.  3. Left atrial size was moderately dilated.  4. The mitral valve is abnormal, mildly thickened and calcified. Mild  mitral valve regurgitation.  5. The aortic valve is tricuspid. There is moderate calcification of the  aortic valve. Aortic valve regurgitation is mild. Mild to moderate aortic  valve stenosis. Aortic regurgitation PHT measures 725 msec. Aortic valve  mean gradient measures 11.3 mmHg.  Aortic valve Vmax measures 2.29 m/s.  6. The inferior vena cava is dilated in size with >50% respiratory  variability, suggesting right atrial pressure of 8 mmHg.    Post Watchman TEE 08/31/20 1. 20 mm Watchman FLX present in the LAA. Max 2D diameter 16 mm (20% compression). There is minimal shoulder present in the 135 degree view. There is no device related thrombus. There is no resiudal leak. The device is well seated.  2. Left ventricular ejection fraction, by estimation, is 55 to 60%. The  left ventricle has normal function.  3. Right ventricular systolic function is normal. The right ventricular  size is normal.  4. Left atrial size was mild to moderately dilated. No left atrial/left  atrial appendage thrombus was detected.  5. Right atrial size was mildly dilated.  6. A small pericardial effusion is present. The pericardial effusion is  anterior to the right ventricle.  7. The mitral valve is grossly normal. Mild mitral valve regurgitation.  No evidence of mitral stenosis.  8. The aortic valve is tricuspid. There is moderate calcification of the  aortic valve. There is moderate thickening of the aortic valve. Aortic  valve  regurgitation is mild to moderate. Mild aortic valve stenosis.  Aortic regurgitation PHT measures 668  msec. Aortic valve area, by VTI measures 1.80 cm. Aortic valve mean  gradient measures 11.5 mmHg. Aortic valve Vmax measures 2.44 m/s.  9. There is Moderate (Grade III) layered plaque involving the transverse  aorta.    Epic records are reviewed at length today  HAS-BLED score 4 Hypertension Yes  Abnormal renal and liver function (Dialysis, transplant, Cr >2.26 mg/dL /Cirrhosis or Bilirubin >2x Normal or AST/ALT/AP >3x Normal) No  Stroke No  Bleeding Yes  Labile INR (Unstable/high INR) No  Elderly (>65) Yes  Drugs or alcohol (? 8 drinks/week, anti-plt or NSAID) Yes    CHA2DS2-VASc Score = 4  The patient's score is based upon: CHF History: No HTN History: Yes Diabetes History: No Stroke History: No Vascular Disease History: Yes Age Score: 2 Gender Score: 0      ASSESSMENT AND PLAN: 1. Persistent Atrial Fibrillation (ICD10:  I48.19) The patient's CHA2DS2-VASc score is 4, indicating a 4.8% annual risk of stroke.   S/p Watchman implant 07/09/20 Post Watchman TEE shows no residual leak or DRT. Will discontinue Eliquis and start Plavix 75 mg daily monotherapy. Continue metoprolol 12.5 mg BID  2. Secondary Hypercoagulable State (ICD10:  D68.69) The patient is at significant risk for stroke/thromboembolism based upon his CHA2DS2-VASc Score of 4.  Continue Apixaban (Eliquis) for now.   3. Obesity Body mass index is 33.88 kg/m. Lifestyle modification was discussed and encouraged including regular physical activity and weight reduction.  4. Obstructive sleep apnea Encouraged compliance with CPAP therapy.  5. HTN Stable, no changes today.   Follow up with Dr Burt Knack as scheduled.    [facility] [address] [place], [place] [address] [phone] 09/02/2020 10:55 AM

## 2020-09-07 DIAGNOSIS — L110 Acquired keratosis follicularis: Secondary | ICD-10-CM | POA: Diagnosis not present

## 2020-09-07 DIAGNOSIS — B351 Tinea unguium: Secondary | ICD-10-CM | POA: Diagnosis not present

## 2020-09-07 DIAGNOSIS — I739 Peripheral vascular disease, unspecified: Secondary | ICD-10-CM | POA: Diagnosis not present

## 2020-09-08 DIAGNOSIS — C44319 Basal cell carcinoma of skin of other parts of face: Secondary | ICD-10-CM | POA: Diagnosis not present

## 2020-09-08 DIAGNOSIS — D485 Neoplasm of uncertain behavior of skin: Secondary | ICD-10-CM | POA: Diagnosis not present

## 2020-09-08 DIAGNOSIS — L570 Actinic keratosis: Secondary | ICD-10-CM | POA: Diagnosis not present

## 2020-09-24 DIAGNOSIS — C44319 Basal cell carcinoma of skin of other parts of face: Secondary | ICD-10-CM | POA: Diagnosis not present

## 2020-09-24 DIAGNOSIS — L570 Actinic keratosis: Secondary | ICD-10-CM | POA: Diagnosis not present

## 2020-10-16 ENCOUNTER — Other Ambulatory Visit: Payer: Self-pay | Admitting: Physician Assistant

## 2020-10-20 ENCOUNTER — Other Ambulatory Visit: Payer: Self-pay | Admitting: Physician Assistant

## 2020-10-21 ENCOUNTER — Other Ambulatory Visit (INDEPENDENT_AMBULATORY_CARE_PROVIDER_SITE_OTHER): Payer: Self-pay | Admitting: Gastroenterology

## 2020-10-21 NOTE — Telephone Encounter (Signed)
Last seen 04/09/2020 for anemia.

## 2020-10-22 DIAGNOSIS — T1512XA Foreign body in conjunctival sac, left eye, initial encounter: Secondary | ICD-10-CM | POA: Diagnosis not present

## 2020-11-03 DIAGNOSIS — I4891 Unspecified atrial fibrillation: Secondary | ICD-10-CM | POA: Diagnosis not present

## 2020-11-03 DIAGNOSIS — E6609 Other obesity due to excess calories: Secondary | ICD-10-CM | POA: Diagnosis not present

## 2020-11-03 DIAGNOSIS — E782 Mixed hyperlipidemia: Secondary | ICD-10-CM | POA: Diagnosis not present

## 2020-11-09 ENCOUNTER — Encounter (INDEPENDENT_AMBULATORY_CARE_PROVIDER_SITE_OTHER): Payer: Medicare Other | Admitting: Ophthalmology

## 2020-11-09 ENCOUNTER — Other Ambulatory Visit: Payer: Self-pay

## 2020-11-09 DIAGNOSIS — H35373 Puckering of macula, bilateral: Secondary | ICD-10-CM

## 2020-11-09 DIAGNOSIS — H35033 Hypertensive retinopathy, bilateral: Secondary | ICD-10-CM

## 2020-11-09 DIAGNOSIS — I10 Essential (primary) hypertension: Secondary | ICD-10-CM

## 2020-11-09 DIAGNOSIS — D3131 Benign neoplasm of right choroid: Secondary | ICD-10-CM | POA: Diagnosis not present

## 2020-11-16 DIAGNOSIS — L570 Actinic keratosis: Secondary | ICD-10-CM | POA: Diagnosis not present

## 2020-11-16 DIAGNOSIS — C44311 Basal cell carcinoma of skin of nose: Secondary | ICD-10-CM | POA: Diagnosis not present

## 2020-11-23 DIAGNOSIS — L110 Acquired keratosis follicularis: Secondary | ICD-10-CM | POA: Diagnosis not present

## 2020-11-23 DIAGNOSIS — I739 Peripheral vascular disease, unspecified: Secondary | ICD-10-CM | POA: Diagnosis not present

## 2020-11-23 DIAGNOSIS — B351 Tinea unguium: Secondary | ICD-10-CM | POA: Diagnosis not present

## 2020-11-25 ENCOUNTER — Encounter (INDEPENDENT_AMBULATORY_CARE_PROVIDER_SITE_OTHER): Payer: Medicare Other | Admitting: Ophthalmology

## 2020-11-25 ENCOUNTER — Other Ambulatory Visit: Payer: Self-pay

## 2020-11-25 DIAGNOSIS — H401132 Primary open-angle glaucoma, bilateral, moderate stage: Secondary | ICD-10-CM

## 2020-12-03 DIAGNOSIS — I4891 Unspecified atrial fibrillation: Secondary | ICD-10-CM | POA: Diagnosis not present

## 2020-12-03 DIAGNOSIS — E782 Mixed hyperlipidemia: Secondary | ICD-10-CM | POA: Diagnosis not present

## 2020-12-23 ENCOUNTER — Other Ambulatory Visit: Payer: Self-pay | Admitting: Urology

## 2020-12-23 DIAGNOSIS — N401 Enlarged prostate with lower urinary tract symptoms: Secondary | ICD-10-CM

## 2021-01-03 DIAGNOSIS — E782 Mixed hyperlipidemia: Secondary | ICD-10-CM | POA: Diagnosis not present

## 2021-01-03 DIAGNOSIS — I4891 Unspecified atrial fibrillation: Secondary | ICD-10-CM | POA: Diagnosis not present

## 2021-01-04 ENCOUNTER — Encounter: Payer: Self-pay | Admitting: Cardiovascular Disease

## 2021-01-04 ENCOUNTER — Other Ambulatory Visit: Payer: Self-pay

## 2021-01-04 ENCOUNTER — Ambulatory Visit: Payer: Medicare Other | Admitting: Cardiovascular Disease

## 2021-01-04 VITALS — BP 116/72 | HR 80 | Ht 68.0 in | Wt 224.0 lb

## 2021-01-04 DIAGNOSIS — I4819 Other persistent atrial fibrillation: Secondary | ICD-10-CM

## 2021-01-04 DIAGNOSIS — D509 Iron deficiency anemia, unspecified: Secondary | ICD-10-CM | POA: Diagnosis not present

## 2021-01-04 MED ORDER — ASPIRIN EC 81 MG PO TBEC: 81.0000 mg | DELAYED_RELEASE_TABLET | Freq: Every day | ORAL | 3 refills | Status: AC

## 2021-01-04 NOTE — Progress Notes (Signed)
Cardiology Office Note:    Date:  01/04/2021   ID:  Paul Lam, DOB 1940/09/22, MRN [id number]  PCP:  [name], MD   [facility] Providers Cardiologist:  Pixie Casino, MD     Referring MD: [name], MD   Chief Complaint  Patient presents with   Atrial Fibrillation     History of Present Illness:    Paul Lam is a 80 y.o. male with a hx of coronary artery disease, hypertension, obstructive sleep apnea on CPAP, and persistent atrial fibrillation.  He presents today for 38-monthfollow-up after undergoing Watchman FLX implantation July 09, 2020.  He was treated with a 20 mm device with complete closure of the left atrial appendage.  He completed 6 weeks of apixaban, underwent TEE and met criteria for discontinuation of anticoagulation with complete closure of the appendage and no PERI device leak or device related thrombus.  He discontinued apixaban and began clopidogrel 75 mg daily.  He has had no further problems with blood loss anemia to the best of his knowledge.  He continues on ferrous sulfate and wonders whether he needs to stay on this long-term.  His last hemoglobin from March 2022 is 12.4.  The patient is here with his wife today.  He denies symptoms of heart palpitations or chest pain.  He has mild exertional dyspnea and generalized fatigue.  He has chronic leg edema with no change.  He has no other complaints today.  Past Medical History:  Diagnosis Date   [place] pain [place]    Anxiety    Arthritis    generalized arthritis    Atrial fibrillation (HCC)    CAD (coronary artery disease)    Cancer (HCC)    skin cancer removed left ear     Complication of anesthesia    Depression    Headache    History of echocardiogram 02/19/2009   EF 50-55%; LA mild-mod dilated;    History of kidney stones    History of nuclear stress test 02/19/2009   low risk, normal    Hyperlipidemia    Hypertension    Obesity (BMI 30-39.9)    OSA (obstructive sleep apnea)     severe with AHI 25/hr now on BiPAP   PONV (postoperative nausea and vomiting)    Retinal detachment    with proliferative vitreoretinopathy   Shortness of breath    related to weight    Sleep apnea    Vertigo     Past Surgical History:  Procedure Laterality Date   APPENDECTOMY     BIOPSY  03/05/2020   Procedure: BIOPSY;  Surgeon: RRogene Houston MD;  [facility];  Service: Endoscopy;;   CARDIAC CATHETERIZATION  07/13/2005   noncritical CAD   CARDIOVERSION N/A 01/29/2018   Procedure: CARDIOVERSION;  Surgeon: NDorothy Spark MD;  [place]  Service: Cardiovascular;  Laterality: N/A;   COLONOSCOPY WITH PROPOFOL N/A 03/06/2020   Procedure: COLONOSCOPY WITH PROPOFOL;  Surgeon: CHarvel Quale MD;  [facility];  Service: Gastroenterology;  Laterality: N/A;   ESOPHAGOGASTRODUODENOSCOPY (EGD) WITH PROPOFOL N/A 03/05/2020   Procedure: ESOPHAGOGASTRODUODENOSCOPY (EGD) WITH PROPOFOL;  Surgeon: RRogene Houston MD;  [facility];  Service: Endoscopy;  Laterality: N/A;   EYE SURGERY     FLEXIBLE SIGMOIDOSCOPY N/A 07/14/2015   Procedure: FLEXIBLE SIGMOIDOSCOPY;  Surgeon: MAviva Signs MD;  [facility];  Service: Gastroenterology;  Laterality: N/A;   GIVENS CAPSULE STUDY N/A 04/15/2020  Procedure: GIVENS CAPSULE STUDY;  Surgeon: Harvel Quale, MD;  [facility];  Service: Gastroenterology;  Laterality: N/A;  730   JOINT REPLACEMENT     LEFT ATRIAL APPENDAGE OCCLUSION N/A 07/09/2020   Procedure: LEFT ATRIAL APPENDAGE OCCLUSION;  Surgeon: Sherren Mocha, MD;  Location: [place] [facility];  Service: Cardiovascular;  Laterality: N/A;   NASAL SEPTOPLASTY W/ TURBINOPLASTY Bilateral 12/19/2017   Procedure: NASAL SEPTOPLASTY WITH TURBINATE REDUCTION;  Surgeon: [name], MD;  Location: [place];  Service: ENT;  Laterality: Bilateral;   NASAL SINUS SURGERY     PARS PLANA VITRECTOMY Left 06/20/2017    Procedure: PARS PLANA VITRECTOMY WITH 25 GAUGE, REPAIR OF COMPLEX TRACTION RETINAL DETACHMENT, MEMBRANE PEEEL;  Surgeon: [name], MD;  Location: [place];  Service: Ophthalmology;  Laterality: Left;   POLYPECTOMY  03/06/2020   Procedure: POLYPECTOMY;  Surgeon: Harvel Quale, MD;  [facility];  Service: Gastroenterology;;   REPAIR OF COMPLEX TRACTION RETINAL DETACHMENT Left 06/20/2017   RETINAL DETACHMENT SURGERY Right 04/2011   SCLERAL BUCKLE WITH POSSIBLE 25 GAUGE PARS PLANA VITRECTOMY Left 05/22/2017   Procedure: SCLERAL BUCKLE, LASER, GAS INJECTION LEFT EYE;  Surgeon: [name], MD;  Location: [place];  Service: Ophthalmology;  Laterality: Left;   SKIN CANCER EXCISION Left 2013   left ear skin ca removed  2 weeks ago - not completeely healed 08/15/11    TEE WITHOUT CARDIOVERSION N/A 07/09/2020   Procedure: TRANSESOPHAGEAL ECHOCARDIOGRAM (TEE);  Surgeon: Sherren Mocha, MD;  Location: [place] [facility];  Service: Cardiovascular;  Laterality: N/A;   TEE WITHOUT CARDIOVERSION N/A 08/31/2020   Procedure: TRANSESOPHAGEAL ECHOCARDIOGRAM (TEE);  Surgeon: Geralynn Rile, MD;  Location: [place];  Service: Cardiovascular;  Laterality: N/A;   TONSILLECTOMY     TOTAL KNEE ARTHROPLASTY  08/22/2011   Procedure: TOTAL KNEE ARTHROPLASTY;  Surgeon: Gearlean Alf, MD;  Location: [facility];  Service: Orthopedics;  Laterality: Right;    Current Medications: Current Meds  Medication Sig   ALPRAZolam (XANAX) 0.5 MG tablet Take 0.5 mg at bedtime by mouth. Anxiety    Ascorbic Acid (VITAMIN C) 1000 MG tablet Take 1,000 mg by mouth daily.   aspirin EC 81 MG tablet Take 1 tablet (81 mg total) by mouth daily. Swallow whole.   brimonidine (ALPHAGAN) 0.15 % ophthalmic solution Place 1 drop into the left eye 2 (two) times daily.   Calcium Polycarbophil (FIBER-CAPS PO) Take 1 capsule by mouth daily.   carboxymethylcellulose (REFRESH PLUS) 0.5 % SOLN Place 1 drop into the  right eye daily as needed (dry eyes).   Cholecalciferol (VITAMIN D3) 50 MCG (2000 UT) TABS Take 5,000 Units by mouth 2 (two) times daily.   docusate sodium (COLACE) 100 MG capsule Take 100 mg by mouth 2 (two) times daily.   FEROSUL 325 (65 Fe) MG tablet TAKE 1 TABLET ONCE DAILY.   finasteride (PROSCAR) 5 MG tablet Take 1 tablet (5 mg total) by mouth daily with supper.   furosemide (LASIX) 40 MG tablet TAKE 1 TABLET ONCE DAILY.   metoprolol tartrate (LOPRESSOR) 25 MG tablet Take 0.5 tablets (12.5 mg total) by mouth 2 (two) times daily.   Misc Natural Products (TART CHERRY ADVANCED PO) Take 1 tablet by mouth daily.    potassium chloride (KLOR-CON) 10 MEQ tablet Take 10 mEq by mouth daily.   simvastatin (ZOCOR) 40 MG tablet Take 40 mg by mouth at bedtime.   tamsulosin (FLOMAX) 0.4 MG CAPS capsule  TAKE 1 CAPSULE BY MOUTH IN THE MORNING AND AT BEDTIME   Zinc 50 MG TABS Take 50 mg by mouth daily.   [DISCONTINUED] clopidogrel (PLAVIX) 75 MG tablet Take 1 tablet (75 mg total) by mouth daily.     Allergies:   Patient has no known allergies.   Social History   Socioeconomic History   Marital status: Married    Spouse name: Not on file   Number of children: 1   Years of education: Not on file   Highest education level: Not on file  Occupational History   Not on file  Tobacco Use   Smoking status: Never   Smokeless tobacco: Never  Vaping Use   Vaping Use: Never used  Substance and Sexual Activity   Alcohol use: No   Drug use: No   Sexual activity: Not on file  Other Topics Concern   Not on file  Social History Narrative   Not on file   Social Determinants of Health   Financial Resource Strain: Not on file  Food Insecurity: Not on file  Transportation Needs: Not on file  Physical Activity: Not on file  Stress: Not on file  Social Connections: Not on file     Family History: The patient's family history includes Heart failure in his father and mother; Stroke in his  father.  ROS:   Please see the history of present illness.    All other systems reviewed and are negative.  EKGs/Labs/Other Studies Reviewed:    The following studies were reviewed today: TEE 09-05-20: 1. 20 mm Watchman FLX present in the LAA. Max 2D diameter 16 mm (20%  compression). There is minimal shoulder present in the 135 degree view.  There is no device related thrombus. There is no resiudal leak. The device  is well seated.   2. Left ventricular ejection fraction, by estimation, is 55 to 60%. The  left ventricle has normal function.   3. Right ventricular systolic function is normal. The right ventricular  size is normal.   4. Left atrial size was mild to moderately dilated. No left atrial/left  atrial appendage thrombus was detected.   5. Right atrial size was mildly dilated.   6. A small pericardial effusion is present. The pericardial effusion is  anterior to the right ventricle.   7. The mitral valve is grossly normal. Mild mitral valve regurgitation.  No evidence of mitral stenosis.   8. The aortic valve is tricuspid. There is moderate calcification of the  aortic valve. There is moderate thickening of the aortic valve. Aortic  valve regurgitation is mild to moderate. Mild aortic valve stenosis.  Aortic regurgitation PHT measures 668  msec. Aortic valve area, by VTI measures 1.80 cm. Aortic valve mean  gradient measures 11.5 mmHg. Aortic valve Vmax measures 2.44 m/s.   9. There is Moderate (Grade III) layered plaque involving the transverse  aorta.   EKG:  EKG is not ordered today.    Recent Labs: 03/03/2020: B Natriuretic Peptide 117.0 03/06/2020: ALT 9 08/17/2020: BUN 13; Creatinine, Ser 0.88; Hemoglobin 12.4; Platelets 125; Potassium 4.2; Sodium 139  Recent Lipid Panel No results found for: CHOL, TRIG, HDL, CHOLHDL, VLDL, LDLCALC, LDLDIRECT   Risk Assessment/Calculations:    CHA2DS2-VASc Score = 4  This indicates a 4.8% annual risk of stroke. The  patient's score is based upon: CHF History: No HTN History: Yes Diabetes History: No Stroke History: No Vascular Disease History: Yes Age Score: 2 Gender Score: 0  Physical Exam:    VS:  BP 116/72   Pulse 80   Ht 5' 8"  (1.727 m)   Wt 224 lb (101.6 kg)   BMI 34.06 kg/m     Wt Readings from Last 3 Encounters:  01/04/21 224 lb (101.6 kg)  09/02/20 222 lb 12.8 oz (101.1 kg)  08/17/20 217 lb (98.4 kg)     GEN:  Well nourished, well developed in no acute distress HEENT: Normal NECK: No JVD; No carotid bruits LYMPHATICS: No lymphadenopathy CARDIAC: Irregularly irregular, no murmurs, rubs, gallops RESPIRATORY:  Clear to auscultation without rales, wheezing or rhonchi  ABDOMEN: Soft, non-tender, non-distended MUSCULOSKELETAL: 1+ bilateral pretibial edema; No deformity  SKIN: Warm and dry NEUROLOGIC:  Alert and oriented x 3 PSYCHIATRIC:  Normal affect   ASSESSMENT:    1. Persistent atrial fibrillation ([place])   2. Iron deficiency anemia, unspecified iron deficiency anemia type    PLAN:    In order of problems listed above:  I reviewed the patient's history and his medication program.  He is just now at 6 months from device implantation after undergoing watchman flx left atrial appendage occlusion.  He has met criteria to discontinue oral anticoagulation and has remained on clopidogrel now through 6 months.  I recommended that he can change from clopidogrel to aspirin 81 mg daily once his current prescription runs out.  He will follow-up with Dr. Debara Pickett in 6 months.  No other changes are made in his medical regimen today.  He appears to be tolerating his atrial fibrillation well. We will check a CBC and iron studies.  He may be able to discontinue his ferrous sulfate pending the studies.  He reports no symptoms of bleeding at this time.  Medication Adjustments/Labs and Tests Ordered: Current medicines are reviewed at length with the patient today.  Concerns regarding  medicines are outlined above.  Orders Placed This Encounter  Procedures   Vitamin B12   Folate   Iron and TIBC   Ferritin   CBC   Meds ordered this encounter  Medications   aspirin EC 81 MG tablet    Sig: Take 1 tablet (81 mg total) by mouth daily. Swallow whole.    Dispense:  90 tablet    Refill:  3    Patient Instructions  Medication Instructions:  1) When you run out of Plavix (Clopidogrel), stop it and start Aspirin 16m once daily  *If you need a refill on your cardiac medications before your next appointment, please call your pharmacy*   Lab Work: CBC and Anemia Panel today  If you have labs (blood work) drawn today and your tests are completely normal, you will receive your results only by: [place](if you have MyChart) OR A paper copy in the mail If you have any lab test that is abnormal or we need to change your treatment, we will call you to review the results.   Testing/Procedures: None   Follow-Up: [facility] and your health needs are our priority.  As part of our continuing mission to provide you with exceptional heart care, we have created designated Provider Care Teams.  These Care Teams include your primary Cardiologist (physician) and Advanced Practice Providers (APPs -  Physician Assistants and Nurse Practitioners) who all work together to provide you with the care you need, when you need it.  We recommend signing up for the patient portal called "MyChart".  Sign up information is provided on this After Visit Summary.  MyChart is  used to connect with patients for Virtual Visits (Telemedicine).  Patients are able to view lab/test results, encounter notes, upcoming appointments, etc.  Non-urgent messages can be sent to your provider as well.   To learn more about what you can do with MyChart, go to [email or web address].    Your next appointment:   6 month(s)  The format for your next appointment:   In Person  Provider:   You may  see Pixie Casino, MD or one of the following Advanced Practice Providers on your designated Care Team:   Almyra Deforest, PA-C Fabian Sharp, PA-C or  Roby Lofts, [place]   Other Instructions     Signed, Sherren Mocha, MD  01/04/2021 10:36 AM    [place]

## 2021-01-04 NOTE — Patient Instructions (Signed)
Medication Instructions:  1) When you run out of Plavix (Clopidogrel), stop it and start Aspirin '81mg'$  once daily  *If you need a refill on your cardiac medications before your next appointment, please call your pharmacy*   Lab Work: CBC and Anemia Panel today  If you have labs (blood work) drawn today and your tests are completely normal, you will receive your results only by: [place] (if you have MyChart) OR A paper copy in the mail If you have any lab test that is abnormal or we need to change your treatment, we will call you to review the results.   Testing/Procedures: None   Follow-Up: At [facility], you and your health needs are our priority.  As part of our continuing mission to provide you with exceptional heart care, we have created designated Provider Care Teams.  These Care Teams include your primary Cardiologist (physician) and Advanced Practice Providers (APPs -  Physician Assistants and Nurse Practitioners) who all work together to provide you with the care you need, when you need it.  We recommend signing up for the patient portal called "MyChart".  Sign up information is provided on this After Visit Summary.  MyChart is used to connect with patients for Virtual Visits (Telemedicine).  Patients are able to view lab/test results, encounter notes, upcoming appointments, etc.  Non-urgent messages can be sent to your provider as well.   To learn more about what you can do with MyChart, go to [email or web address].    Your next appointment:   6 month(s)  The format for your next appointment:   In Person  Provider:   You may see Pixie Casino, MD or one of the following Advanced Practice Providers on your designated Care Team:   Almyra Deforest, PA-C Fabian Sharp, PA-C or  Roby Lofts, [place]   Other Instructions

## 2021-01-05 ENCOUNTER — Telehealth: Payer: Self-pay

## 2021-01-05 DIAGNOSIS — D509 Iron deficiency anemia, unspecified: Secondary | ICD-10-CM

## 2021-01-05 LAB — CBC
Hematocrit: 41.2 % (ref 37.5–51.0)
Hemoglobin: 13.3 g/dL (ref 13.0–17.7)
MCH: 29.4 pg (ref 26.6–33.0)
MCHC: 32.3 g/dL (ref 31.5–35.7)
MCV: 91 fL (ref 79–97)
Platelets: 113 10*3/uL — ABNORMAL LOW (ref 150–450)
RBC: 4.53 x10E6/uL (ref 4.14–5.80)
RDW: 12.7 % (ref 11.6–15.4)
WBC: 5.7 10*3/uL (ref 3.4–10.8)

## 2021-01-05 LAB — VITAMIN B12: Vitamin B-12: 349 pg/mL (ref 232–1245)

## 2021-01-05 LAB — IRON AND TIBC
Iron Saturation: 33 % (ref 15–55)
Iron: 93 ug/dL (ref 38–169)
Total Iron Binding Capacity: 286 ug/dL (ref 250–450)
UIBC: 193 ug/dL (ref 111–343)

## 2021-01-05 LAB — FOLATE: Folate: >20 ng/mL (ref >3.0)

## 2021-01-05 LAB — FERRITIN: Ferritin: 103 ng/mL (ref 30–400)

## 2021-01-05 NOTE — Telephone Encounter (Signed)
-----   Message from Sherren Mocha, MD sent at 01/05/2021 12:04 PM EDT ----- Results noted. CBC/iron studies normal. OK to stop taking ferrous sulfate and would repeat CBC in about 3 months. thanks

## 2021-01-05 NOTE — Telephone Encounter (Signed)
Pt and his wife are aware of his lab work and recommendation. He will discontinue ferrous sulfate and have repeat labs drawn the week of Oct 31.  She had no additional questions.

## 2021-01-06 DIAGNOSIS — Z1389 Encounter for screening for other disorder: Secondary | ICD-10-CM | POA: Diagnosis not present

## 2021-01-06 DIAGNOSIS — Z0001 Encounter for general adult medical examination with abnormal findings: Secondary | ICD-10-CM | POA: Diagnosis not present

## 2021-01-06 DIAGNOSIS — E6609 Other obesity due to excess calories: Secondary | ICD-10-CM | POA: Diagnosis not present

## 2021-01-06 DIAGNOSIS — I10 Essential (primary) hypertension: Secondary | ICD-10-CM | POA: Diagnosis not present

## 2021-01-06 DIAGNOSIS — D696 Thrombocytopenia, unspecified: Secondary | ICD-10-CM | POA: Diagnosis not present

## 2021-01-06 DIAGNOSIS — F419 Anxiety disorder, unspecified: Secondary | ICD-10-CM | POA: Diagnosis not present

## 2021-01-07 DIAGNOSIS — L110 Acquired keratosis follicularis: Secondary | ICD-10-CM | POA: Diagnosis not present

## 2021-01-07 DIAGNOSIS — I739 Peripheral vascular disease, unspecified: Secondary | ICD-10-CM | POA: Diagnosis not present

## 2021-01-07 DIAGNOSIS — B351 Tinea unguium: Secondary | ICD-10-CM | POA: Diagnosis not present

## 2021-02-05 DIAGNOSIS — Z6834 Body mass index (BMI) 34.0-34.9, adult: Secondary | ICD-10-CM | POA: Diagnosis not present

## 2021-02-05 DIAGNOSIS — N401 Enlarged prostate with lower urinary tract symptoms: Secondary | ICD-10-CM | POA: Diagnosis not present

## 2021-02-05 DIAGNOSIS — R1030 Lower abdominal pain, unspecified: Secondary | ICD-10-CM | POA: Diagnosis not present

## 2021-02-05 DIAGNOSIS — E6609 Other obesity due to excess calories: Secondary | ICD-10-CM | POA: Diagnosis not present

## 2021-03-15 DIAGNOSIS — L570 Actinic keratosis: Secondary | ICD-10-CM | POA: Diagnosis not present

## 2021-03-24 ENCOUNTER — Other Ambulatory Visit: Payer: Self-pay | Admitting: Urology

## 2021-03-24 DIAGNOSIS — N401 Enlarged prostate with lower urinary tract symptoms: Secondary | ICD-10-CM

## 2021-03-25 DIAGNOSIS — I739 Peripheral vascular disease, unspecified: Secondary | ICD-10-CM | POA: Diagnosis not present

## 2021-03-25 DIAGNOSIS — L110 Acquired keratosis follicularis: Secondary | ICD-10-CM | POA: Diagnosis not present

## 2021-03-25 DIAGNOSIS — B351 Tinea unguium: Secondary | ICD-10-CM | POA: Diagnosis not present

## 2021-03-30 NOTE — Telephone Encounter (Signed)
Patient left a Voice Mail:  Needing a refill ASAP on: tamsulosin (FLOMAX) 0.4 MG CAPS capsule  Please advise.  Thanks, Helene Kelp

## 2021-04-05 DIAGNOSIS — G4733 Obstructive sleep apnea (adult) (pediatric): Secondary | ICD-10-CM | POA: Diagnosis not present

## 2021-04-07 ENCOUNTER — Other Ambulatory Visit: Payer: Medicare Other | Admitting: *Deleted

## 2021-04-07 ENCOUNTER — Other Ambulatory Visit: Payer: Self-pay

## 2021-04-07 DIAGNOSIS — D509 Iron deficiency anemia, unspecified: Secondary | ICD-10-CM

## 2021-04-07 LAB — CBC
Hematocrit: 38.7 % (ref 37.5–51.0)
Hemoglobin: 12.9 g/dL — ABNORMAL LOW (ref 13.0–17.7)
MCH: 29.2 pg (ref 26.6–33.0)
MCHC: 33.3 g/dL (ref 31.5–35.7)
MCV: 88 fL (ref 79–97)
Platelets: 112 10*3/uL — ABNORMAL LOW (ref 150–450)
RBC: 4.42 x10E6/uL (ref 4.14–5.80)
RDW: 14.5 % (ref 11.6–15.4)
WBC: 6.3 10*3/uL (ref 3.4–10.8)

## 2021-05-27 ENCOUNTER — Encounter (INDEPENDENT_AMBULATORY_CARE_PROVIDER_SITE_OTHER): Payer: Medicare Other | Admitting: Ophthalmology

## 2021-05-27 ENCOUNTER — Other Ambulatory Visit: Payer: Self-pay

## 2021-05-27 DIAGNOSIS — H401122 Primary open-angle glaucoma, left eye, moderate stage: Secondary | ICD-10-CM

## 2021-05-27 DIAGNOSIS — H338 Other retinal detachments: Secondary | ICD-10-CM | POA: Diagnosis not present

## 2021-05-27 DIAGNOSIS — H43811 Vitreous degeneration, right eye: Secondary | ICD-10-CM

## 2021-05-27 DIAGNOSIS — H35033 Hypertensive retinopathy, bilateral: Secondary | ICD-10-CM

## 2021-05-27 DIAGNOSIS — I10 Essential (primary) hypertension: Secondary | ICD-10-CM

## 2021-05-27 DIAGNOSIS — D3131 Benign neoplasm of right choroid: Secondary | ICD-10-CM

## 2021-05-27 DIAGNOSIS — H35373 Puckering of macula, bilateral: Secondary | ICD-10-CM | POA: Diagnosis not present

## 2021-06-15 DIAGNOSIS — I10 Essential (primary) hypertension: Secondary | ICD-10-CM | POA: Diagnosis not present

## 2021-06-15 DIAGNOSIS — Z1389 Encounter for screening for other disorder: Secondary | ICD-10-CM | POA: Diagnosis not present

## 2021-06-15 DIAGNOSIS — F419 Anxiety disorder, unspecified: Secondary | ICD-10-CM | POA: Diagnosis not present

## 2021-06-15 DIAGNOSIS — Z0001 Encounter for general adult medical examination with abnormal findings: Secondary | ICD-10-CM | POA: Diagnosis not present

## 2021-06-17 DIAGNOSIS — L110 Acquired keratosis follicularis: Secondary | ICD-10-CM | POA: Diagnosis not present

## 2021-06-17 DIAGNOSIS — I739 Peripheral vascular disease, unspecified: Secondary | ICD-10-CM | POA: Diagnosis not present

## 2021-06-17 DIAGNOSIS — B351 Tinea unguium: Secondary | ICD-10-CM | POA: Diagnosis not present

## 2021-06-25 DIAGNOSIS — I4819 Other persistent atrial fibrillation: Secondary | ICD-10-CM | POA: Diagnosis not present

## 2021-06-25 DIAGNOSIS — S90412A Abrasion, left great toe, initial encounter: Secondary | ICD-10-CM | POA: Diagnosis not present

## 2021-06-25 DIAGNOSIS — I5030 Unspecified diastolic (congestive) heart failure: Secondary | ICD-10-CM | POA: Diagnosis not present

## 2021-07-08 ENCOUNTER — Other Ambulatory Visit: Payer: Self-pay | Admitting: Urology

## 2021-07-08 ENCOUNTER — Other Ambulatory Visit: Payer: Self-pay | Admitting: Physician Assistant

## 2021-07-08 DIAGNOSIS — N401 Enlarged prostate with lower urinary tract symptoms: Secondary | ICD-10-CM

## 2021-07-14 DIAGNOSIS — Z23 Encounter for immunization: Secondary | ICD-10-CM | POA: Diagnosis not present

## 2021-07-14 DIAGNOSIS — Z6835 Body mass index (BMI) 35.0-35.9, adult: Secondary | ICD-10-CM | POA: Diagnosis not present

## 2021-07-14 DIAGNOSIS — F419 Anxiety disorder, unspecified: Secondary | ICD-10-CM | POA: Diagnosis not present

## 2021-07-14 DIAGNOSIS — E6609 Other obesity due to excess calories: Secondary | ICD-10-CM | POA: Diagnosis not present

## 2021-07-28 ENCOUNTER — Other Ambulatory Visit: Payer: Self-pay

## 2021-07-28 ENCOUNTER — Encounter: Payer: Self-pay | Admitting: Internal Medicine

## 2021-07-28 ENCOUNTER — Ambulatory Visit: Payer: Medicare Other | Admitting: Internal Medicine

## 2021-07-28 VITALS — BP 115/64 | HR 56 | Ht 68.0 in | Wt 231.0 lb

## 2021-07-28 DIAGNOSIS — E668 Other obesity: Secondary | ICD-10-CM | POA: Diagnosis not present

## 2021-07-28 DIAGNOSIS — I5032 Chronic diastolic (congestive) heart failure: Secondary | ICD-10-CM | POA: Diagnosis not present

## 2021-07-28 DIAGNOSIS — I4821 Permanent atrial fibrillation: Secondary | ICD-10-CM | POA: Diagnosis not present

## 2021-07-28 DIAGNOSIS — E669 Obesity, unspecified: Secondary | ICD-10-CM

## 2021-07-28 DIAGNOSIS — D509 Iron deficiency anemia, unspecified: Secondary | ICD-10-CM

## 2021-07-28 NOTE — Progress Notes (Signed)
OFFICE NOTE  Chief Complaint:  Follow-up Watchman  Primary Care Physician: [name], MD  HPI:  Paul Lam is a 81 year old gentleman with a history of coronary disease with an EF of about 50-55%, also hypertension, dyslipidemia and sleep apnea on CPAP. Recently his EF had been reported to be lower, however, it is actually fairly normal. Overall he has done very well. No chest pain, shortness of breath, palpitations, presyncope or syncopal symptoms. He did have other non-cardiac problems over the past year, including a detached retina, skin cancer with grafting to the left ear, as well as a total knee replacement. He tolerated these surgeries well without any evidence of MI. His only concern today is some positional dizziness. He says that he gets dizzy when he bends over too quickly and sits up. He also gets dizzy sometimes when turning his head to the side. It was thought this might be due to positional orthostasis and his Lasix was held recently but there has been no significant change in his symptoms. During his episodes he also feels like there is some spinning of the room which sounds like vertigo to me.  Paul Lam returns today he is doing fairly well. He has felt somewhat fatigued recently however and it is noted that he is bradycardic today. He still get some positional dizziness and had one episode of vertigo. He was seen at [facility] and they thought that there was possibly some insufficiency to the brain may be vertebrobasilar insufficiency that was leading to his vestibular problems.  Some Paul Lam back in the office today. Overall he is doing fairly well. He continues to struggle with vertigo and positional dizziness, mainly when bending over. He has chronic lower extremity edema which is likely due to venous hypertension. I've offered venous Dopplers before but he seems to be asymptomatic with it and he is declined. He's followed for sleep apnea by Dr. Radford Pax and saw  her recently and seems to be fairly compliant with this regimen. He takes furosemide several times a week for swelling.  01/04/2016  Paul Lam returns today for follow-up. He denies any chest pain or worsening shortness of breath. Unfortunately weight is up somewhat. He is not exercising regularly. Blood pressure is well-controlled at 120/56. He had recent follow-up with his primary care provider reported his cholesterol is at goal as well. He is going to see his urologist in a few weeks. He is compliant with CPAP and generally feels that he sleeps well at night.  01/23/2017  Paul Lam was seen today in follow-up. Today he is very sad and is accompanied by his wife. Their grandson recently committed suicide. I was to commend him on his weight loss, but I suspect the weight loss is been due to poor appetite since his grieving. He's also had some depression is now on medications. Overall he denies any chest pain or worsening shortness of breath. His blood pressure actually looks much better today, probably attributed to the weight loss. EKG is sinus rhythm.  03/09/2018  Paul Lam is seen today in annual follow-up.  He seemed to be doing well although did break down when he recalled their grandson who committed suicide last year and told me that his granddaughter also had attempted suicide by overdose.  Recently he had new onset atrial fibrillation and underwent cardioversion.  He followed up with Roderic Palau in the A. fib clinic and is been on Eliquis.  He seems to be maintaining sinus rhythm.  Otherwise PaulKooi has had no new issues.  Weight still is an issue.  Blood pressure was little elevated today however did come down.  Labs from July showed total cholesterol 113, HDL 44, LDL 61 and triglycerides 41.  03/21/2019  Paul Lam returns today for follow-up.  Recently was hospitalized and had urinary tract infection.  This was complicated by recurrent atrial fibrillation.  He had had a cardioversion  last year and maintained sinus.  According to him he thought he felt somewhat better after getting back to sinus rhythm however after this episode where he recently went into A. fib, he said he could not tell the difference.  He was therefore rate controlled and placed on anticoagulation.  He did follow-up with Roderic Palau, NP in the A. fib clinic.  At the time she felt this could be possibly ERAF, but was unclear whether he needed antiarrhythmic therapy.  Today he returns and he remains in atrial fibrillation.  He says he monitors it on his apple watch.  In general other than being alerted by his watch he has no symptoms related to it.  His only other complaint is he gets some occasional lower extremity swelling but thinks may be related to salt indiscretion.  03/23/2020  Paul Lam returns for annual follow-up this is also a hospitalization follow-up.  He was [facility] from [facility] on March 07, 2020 after several days of evaluation for symptomatic anemia and possible diastolic heart failure.  Repeat echo was performed which showed normal EF of 60 to 28% and diastolic function could not be evaluated.  His right atrial pressure was elevated.  Reportedly he had acute on chronic lower extremity edema and his diuretics were increased.  He is currently on Lasix and denies any shortness of breath, orthopnea, or other heart failure symptoms.  He has chronic stocking wear.  The etiology of his anemia has not been found.  He does stay on Eliquis for atrial fibrillation.  He reported the Eliquis was not discontinued.  He had endoscopies and has a follow-up next month with GI.  EKG today shows A. fib with slow ventricular response.  07/28/2021  Paul Lam returns today for follow-up of his A-fib.  He recently underwent watchman placement which was successful.  Since then he is weaned off of anticoagulation and remains on low-dose aspirin.  He is in permanent A-fib with slow ventricular response today at  56.  Blood pressure is well controlled.  Denies any worsening shortness of breath or chest pain.  PMHx:  Past Medical History:  Diagnosis Date   Anginal pain ([place])    Anxiety    Arthritis    generalized arthritis    Atrial fibrillation (HCC)    CAD (coronary artery disease)    Cancer (HCC)    skin cancer removed left ear     Complication of anesthesia    Depression    Headache    History of echocardiogram 02/19/2009   EF 50-55%; LA mild-mod dilated;    History of kidney stones    History of nuclear stress test 02/19/2009   low risk, normal    Hyperlipidemia    Hypertension    Obesity (BMI 30-39.9)    OSA (obstructive sleep apnea)    severe with AHI 25/hr now on BiPAP   PONV (postoperative nausea and vomiting)    Retinal detachment    with proliferative vitreoretinopathy   Shortness of breath    related to weight    Sleep apnea  Vertigo     Past Surgical History:  Procedure Laterality Date   APPENDECTOMY     BIOPSY  03/05/2020   Procedure: BIOPSY;  Surgeon: Rogene Houston, MD;  [facility];  Service: Endoscopy;;   CARDIAC CATHETERIZATION  07/13/2005   noncritical CAD   CARDIOVERSION N/A 01/29/2018   Procedure: CARDIOVERSION;  Surgeon: Dorothy Spark, MD;  Location: [place];  Service: Cardiovascular;  Laterality: N/A;   COLONOSCOPY WITH PROPOFOL N/A 03/06/2020   Procedure: COLONOSCOPY WITH PROPOFOL;  Surgeon: Harvel Quale, MD;  [facility];  Service: Gastroenterology;  Laterality: N/A;   ESOPHAGOGASTRODUODENOSCOPY (EGD) WITH PROPOFOL N/A 03/05/2020   Procedure: ESOPHAGOGASTRODUODENOSCOPY (EGD) WITH PROPOFOL;  Surgeon: Rogene Houston, MD;  [facility];  Service: Endoscopy;  Laterality: N/A;   EYE SURGERY     FLEXIBLE SIGMOIDOSCOPY N/A 07/14/2015   Procedure: FLEXIBLE SIGMOIDOSCOPY;  Surgeon: Aviva Signs, MD;  [facility];  Service: Gastroenterology;  Laterality: N/A;   GIVENS CAPSULE STUDY N/A  04/15/2020   Procedure: GIVENS CAPSULE STUDY;  Surgeon: Harvel Quale, MD;  [facility];  Service: Gastroenterology;  Laterality: N/A;  730   JOINT REPLACEMENT     LEFT ATRIAL APPENDAGE OCCLUSION N/A 07/09/2020   Procedure: LEFT ATRIAL APPENDAGE OCCLUSION;  Surgeon: Sherren Mocha, MD;  Location: [place] [facility];  Service: Cardiovascular;  Laterality: N/A;   NASAL SEPTOPLASTY W/ TURBINOPLASTY Bilateral 12/19/2017   Procedure: NASAL SEPTOPLASTY WITH TURBINATE REDUCTION;  Surgeon: [name], MD;  Location: [place];  Service: ENT;  Laterality: Bilateral;   NASAL SINUS SURGERY     PARS PLANA VITRECTOMY Left 06/20/2017   Procedure: PARS PLANA VITRECTOMY WITH 25 GAUGE, REPAIR OF COMPLEX TRACTION RETINAL DETACHMENT, MEMBRANE PEEEL;  Surgeon: [name], MD;  Location: [place];  Service: Ophthalmology;  Laterality: Left;   POLYPECTOMY  03/06/2020   Procedure: POLYPECTOMY;  Surgeon: Harvel Quale, MD;  [facility];  Service: Gastroenterology;;   REPAIR OF COMPLEX TRACTION RETINAL DETACHMENT Left 06/20/2017   RETINAL DETACHMENT SURGERY Right 04/2011   SCLERAL BUCKLE WITH POSSIBLE 25 GAUGE PARS PLANA VITRECTOMY Left 05/22/2017   Procedure: SCLERAL BUCKLE, LASER, GAS INJECTION LEFT EYE;  Surgeon: [name], MD;  Location: [place];  Service: Ophthalmology;  Laterality: Left;   SKIN CANCER EXCISION Left 2013   left ear skin ca removed  2 weeks ago - not completeely healed 08/15/11    TEE WITHOUT CARDIOVERSION N/A 07/09/2020   Procedure: TRANSESOPHAGEAL ECHOCARDIOGRAM (TEE);  Surgeon: Sherren Mocha, MD;  Location: [place] [facility];  Service: Cardiovascular;  Laterality: N/A;   TEE WITHOUT CARDIOVERSION N/A 08/31/2020   Procedure: TRANSESOPHAGEAL ECHOCARDIOGRAM (TEE);  Surgeon: Geralynn Rile, MD;  Location: [place];  Service: Cardiovascular;  Laterality: N/A;   TONSILLECTOMY     TOTAL KNEE ARTHROPLASTY  08/22/2011    Procedure: TOTAL KNEE ARTHROPLASTY;  Surgeon: Gearlean Alf, MD;  Location: [facility];  Service: Orthopedics;  Laterality: Right;    FAMHx:  Family History  Problem Relation Age of Onset   Heart failure Mother        also arthritis   Stroke Father        also emphysema   Heart failure Father     SOCHx:   reports that he has never smoked. He has never used smokeless tobacco. He reports that he does not drink alcohol and does not use drugs.  ALLERGIES:  No Known  Allergies  ROS: Pertinent items noted in HPI and remainder of comprehensive ROS otherwise negative.  HOME MEDS: Current Outpatient Medications  Medication Sig Dispense Refill   ALPRAZolam (XANAX) 0.5 MG tablet Take 0.5 mg at bedtime by mouth. Anxiety      Ascorbic Acid (VITAMIN C) 1000 MG tablet Take 1,000 mg by mouth daily.     aspirin EC 81 MG tablet Take 1 tablet (81 mg total) by mouth daily. Swallow whole. 90 tablet 3   brimonidine (ALPHAGAN) 0.2 % ophthalmic solution SMARTSIG:In Eye(s)     Calcium Polycarbophil (FIBER-CAPS PO) Take 1 capsule by mouth daily.     carboxymethylcellulose (REFRESH PLUS) 0.5 % SOLN Place 1 drop into the right eye daily as needed (dry eyes).     Cholecalciferol (VITAMIN D3) 50 MCG (2000 UT) TABS Take 5,000 Units by mouth 2 (two) times daily.     docusate sodium (COLACE) 100 MG capsule Take 100 mg by mouth 2 (two) times daily.     FEROSUL 325 (65 Fe) MG tablet TAKE 1 TABLET ONCE DAILY. 30 tablet 2   FIBER COMPLETE PO Take by mouth in the morning and at bedtime.     finasteride (PROSCAR) 5 MG tablet Take 1 tablet (5 mg total) by mouth daily with supper. 90 tablet 3   furosemide (LASIX) 40 MG tablet TAKE 1 TABLET ONCE DAILY. 30 tablet 6   metoprolol tartrate (LOPRESSOR) 25 MG tablet Take 0.5 tablets (12.5 mg total) by mouth 2 (two) times daily. 60 tablet 1   Misc Natural Products (TART CHERRY ADVANCED PO) Take 1 tablet by mouth daily.      NON FORMULARY at bedtime. VPAP     potassium chloride  (KLOR-CON) 10 MEQ tablet Take 10 mEq by mouth daily.     simvastatin (ZOCOR) 40 MG tablet Take 40 mg by mouth at bedtime.     tamsulosin (FLOMAX) 0.4 MG CAPS capsule TAKE 1 CAPSULE BY MOUTH IN THE MORNING AND AT BEDTIME 60 capsule 0   Zinc 50 MG TABS Take 50 mg by mouth daily.     No current facility-administered medications for this visit.    LABS/IMAGING: No results found for this or any previous visit (from the past 48 hour(s)). No results found.  VITALS: BP 115/64    Pulse (!) 56    Ht 5\' 8"  (1.727 m)    Wt 231 lb (104.8 kg)    SpO2 99%    BMI 35.12 kg/m   EXAM: General appearance: alert and no distress Neck: no carotid bruit, no JVD and thyroid not enlarged, symmetric, no tenderness/mass/nodules Lungs: clear to auscultation bilaterally Heart: regular rate and rhythm, S1, S2 normal, no murmur, click, rub or gallop Abdomen: soft, non-tender; bowel sounds normal; no masses,  no organomegaly Extremities: extremities normal, atraumatic, no cyanosis or edema Pulses: 2+ and symmetric Skin: Skin color, texture, turgor normal. No rashes or lesions Neurologic: GroPsych: Pleasanty normal Psych: Pleasant  EKG: A. fib with a slow ventricular response at 56 -personally reviewed  ASSESSMENT: Permanent atrial fibrillation-CHADSVASC score of 4 -is post watchman left atrial appendage occluder device (2022) Hypertension Dyslipidemia Obesity Dizziness Anxiety OSA on CPAP Chronic lower extremity edema secondary to venous hypertension Symptomatic anemia  PLAN: 1.   Mr. Rivadeneira had successful placement of a watchman left atrial appendage occluder device last year.  He is on low-dose aspirin.  He seems to be doing well without any recurrent bleeding issues.  His anemia has improved.  His blood pressure is  well controlled.  He is in a permanent A-fib but is rate controlled.  No changes to his medicines today.  I stressed continued weight loss.  Follow-up with me in 6 months or sooner if  necessary.  Pixie Casino, MD, [facility], [place] Director of the [facility]  Cardiovascular [facility] Reduction [facility] Diplomate of the American Board of Clinical Lipidology Attending Cardiologist  Direct Dial: [phone]   Fax: [phone]  Website:  [email or web address] Esai Stecklein 07/28/2021, 11:19 AM

## 2021-07-28 NOTE — Patient Instructions (Signed)
Medication Instructions:  ?NO CHANGES ? ?*If you need a refill on your cardiac medications before your next appointment, please call your pharmacy* ? ? ?Follow-Up: ?At [facility], you and your health needs are our priority.  As part of our continuing mission to provide you with exceptional heart care, we have created designated Provider Care Teams.  These Care Teams include your primary Cardiologist (physician) and Advanced Practice Providers (APPs -  Physician Assistants and Nurse Practitioners) who all work together to provide you with the care you need, when you need it. ? ?We recommend signing up for the patient portal called "MyChart".  Sign up information is provided on this After Visit Summary.  MyChart is used to connect with patients for Virtual Visits (Telemedicine).  Patients are able to view lab/test results, encounter notes, upcoming appointments, etc.  Non-urgent messages can be sent to your provider as well.   ?To learn more about what you can do with MyChart, go to [email or web address].   ? ?Your next appointment:   ? ?1 year with Dr. Hilty  ?

## 2021-08-06 ENCOUNTER — Other Ambulatory Visit: Payer: Self-pay | Admitting: Urology

## 2021-08-06 DIAGNOSIS — N401 Enlarged prostate with lower urinary tract symptoms: Secondary | ICD-10-CM

## 2021-08-10 ENCOUNTER — Other Ambulatory Visit: Payer: Self-pay

## 2021-08-10 ENCOUNTER — Telehealth: Payer: Self-pay

## 2021-08-10 DIAGNOSIS — N401 Enlarged prostate with lower urinary tract symptoms: Secondary | ICD-10-CM

## 2021-08-10 MED ORDER — TAMSULOSIN HCL 0.4 MG PO CAPS: ORAL_CAPSULE | ORAL | 0 refills | Status: DC

## 2021-08-10 NOTE — Telephone Encounter (Signed)
Patient's wife called and advised patient needed a refill on medication. ? ?Medication: ?tamsulosin (FLOMAX) 0.4 MG CAPS capsule ? ? ?Pharmacy: ?[place], [address] [place] ?

## 2021-08-10 NOTE — Telephone Encounter (Signed)
Patient needing refill on medication. ? ?Medication: ?tamsulosin (FLOMAX) 0.4 MG CAPS capsule ? ?Pharmacy: ?[place], [address] ?

## 2021-08-11 ENCOUNTER — Ambulatory Visit: Payer: Medicare Other | Admitting: Urology

## 2021-08-11 NOTE — Telephone Encounter (Signed)
?  Refill sent 03/07 ?

## 2021-08-12 ENCOUNTER — Other Ambulatory Visit: Payer: Self-pay

## 2021-08-12 DIAGNOSIS — N401 Enlarged prostate with lower urinary tract symptoms: Secondary | ICD-10-CM

## 2021-08-12 MED ORDER — TAMSULOSIN HCL 0.4 MG PO CAPS: ORAL_CAPSULE | ORAL | 0 refills | Status: DC

## 2021-08-17 ENCOUNTER — Encounter: Payer: Self-pay | Admitting: Urology

## 2021-08-17 ENCOUNTER — Other Ambulatory Visit: Payer: Self-pay

## 2021-08-17 ENCOUNTER — Ambulatory Visit: Payer: Medicare Other | Admitting: Urology

## 2021-08-17 VITALS — BP 105/55 | HR 64

## 2021-08-17 DIAGNOSIS — N401 Enlarged prostate with lower urinary tract symptoms: Secondary | ICD-10-CM

## 2021-08-17 DIAGNOSIS — R351 Nocturia: Secondary | ICD-10-CM

## 2021-08-17 MED ORDER — FINASTERIDE 5 MG PO TABS: 5.0000 mg | ORAL_TABLET | Freq: Every day | ORAL | 3 refills | Status: DC

## 2021-08-17 MED ORDER — TAMSULOSIN HCL 0.4 MG PO CAPS: ORAL_CAPSULE | ORAL | 3 refills | Status: DC

## 2021-08-17 NOTE — Patient Instructions (Signed)

## 2021-08-17 NOTE — Progress Notes (Signed)
08/17/2021 10:44 AM   Paul Lam 1940/12/10 [id number]  Referring provider: Assunta Found, MD [address] [place],  [place] [address]  Followup BPH and nocturia  HPI: Paul Lam is a 80yo here for followup for BPH with nocturia. He is on flomax 0.4mg  BID and finasteride. IPSS 5 QOl 0. Urine stream strong. Nocturia 1-2x. NO straining to urinate. No dysuria or dysuria. NO UTI.  No other complaints today   PMH: Past Medical History:  Diagnosis Date   Anginal pain (HCC)    Anxiety    Arthritis    generalized arthritis    Atrial fibrillation (HCC)    CAD (coronary artery disease)    Cancer (HCC)    skin cancer removed left ear     Complication of anesthesia    Depression    Headache    History of echocardiogram 02/19/2009   EF 50-55%; LA mild-mod dilated;    History of kidney stones    History of nuclear stress test 02/19/2009   low risk, normal    Hyperlipidemia    Hypertension    Obesity (BMI 30-39.9)    OSA (obstructive sleep apnea)    severe with AHI 25/hr now on BiPAP   PONV (postoperative nausea and vomiting)    Retinal detachment    with proliferative vitreoretinopathy   Shortness of breath    related to weight    Sleep apnea    Vertigo     Surgical History: Past Surgical History:  Procedure Laterality Date   APPENDECTOMY     BIOPSY  03/05/2020   Procedure: BIOPSY;  Surgeon: Malissa Hippo, MD;  [facility];  Service: Endoscopy;;   CARDIAC CATHETERIZATION  07/13/2005   noncritical CAD   CARDIOVERSION N/A 01/29/2018   Procedure: CARDIOVERSION;  Surgeon: Lars Masson, MD;  Location: [facility] ENDOSCOPY;  Service: Cardiovascular;  Laterality: N/A;   COLONOSCOPY WITH PROPOFOL N/A 03/06/2020   Procedure: COLONOSCOPY WITH PROPOFOL;  Surgeon: Dolores Frame, MD;  [facility];  Service: Gastroenterology;  Laterality: N/A;   ESOPHAGOGASTRODUODENOSCOPY (EGD) WITH PROPOFOL N/A 03/05/2020   Procedure: ESOPHAGOGASTRODUODENOSCOPY  (EGD) WITH PROPOFOL;  Surgeon: Malissa Hippo, MD;  [facility];  Service: Endoscopy;  Laterality: N/A;   EYE SURGERY     FLEXIBLE SIGMOIDOSCOPY N/A 07/14/2015   Procedure: FLEXIBLE SIGMOIDOSCOPY;  Surgeon: Franky Macho, MD;  [facility];  Service: Gastroenterology;  Laterality: N/A;   GIVENS CAPSULE STUDY N/A 04/15/2020   Procedure: GIVENS CAPSULE STUDY;  Surgeon: Dolores Frame, MD;  [facility];  Service: Gastroenterology;  Laterality: N/A;  730   JOINT REPLACEMENT     LEFT ATRIAL APPENDAGE OCCLUSION N/A 07/09/2020   Procedure: LEFT ATRIAL APPENDAGE OCCLUSION;  Surgeon: Tonny Bollman, MD;  Location: [facility] INVASIVE CV LAB;  Service: Cardiovascular;  Laterality: N/A;   NASAL SEPTOPLASTY W/ TURBINOPLASTY Bilateral 12/19/2017   Procedure: NASAL SEPTOPLASTY WITH TURBINATE REDUCTION;  Surgeon: Newman Pies, MD;  Location: [facility];  Service: ENT;  Laterality: Bilateral;   NASAL SINUS SURGERY     PARS PLANA VITRECTOMY Left 06/20/2017   Procedure: PARS PLANA VITRECTOMY WITH 25 GAUGE, REPAIR OF COMPLEX TRACTION RETINAL DETACHMENT, MEMBRANE PEEEL;  Surgeon: Sherrie George, MD;  Location: [facility] OR;  Service: Ophthalmology;  Laterality: Left;   POLYPECTOMY  03/06/2020   Procedure: POLYPECTOMY;  Surgeon: Dolores Frame, MD;  [facility];  Service: Gastroenterology;;   REPAIR OF COMPLEX TRACTION RETINAL DETACHMENT  Left 06/20/2017   RETINAL DETACHMENT SURGERY Right 04/2011   SCLERAL BUCKLE WITH POSSIBLE 25 GAUGE PARS PLANA VITRECTOMY Left 05/22/2017   Procedure: SCLERAL BUCKLE, LASER, GAS INJECTION LEFT EYE;  Surgeon: Sherrie George, MD;  Location: [facility] OR;  Service: Ophthalmology;  Laterality: Left;   SKIN CANCER EXCISION Left 2013   left ear skin ca removed  2 weeks ago - not completeely healed 08/15/11    TEE WITHOUT CARDIOVERSION N/A 07/09/2020   Procedure: TRANSESOPHAGEAL ECHOCARDIOGRAM (TEE);  Surgeon: Tonny Bollman, MD;   Location: [facility] INVASIVE CV LAB;  Service: Cardiovascular;  Laterality: N/A;   TEE WITHOUT CARDIOVERSION N/A 08/31/2020   Procedure: TRANSESOPHAGEAL ECHOCARDIOGRAM (TEE);  Surgeon: Sande Rives, MD;  Location: [facility] ENDOSCOPY;  Service: Cardiovascular;  Laterality: N/A;   TONSILLECTOMY     TOTAL KNEE ARTHROPLASTY  08/22/2011   Procedure: TOTAL KNEE ARTHROPLASTY;  Surgeon: Loanne Drilling, MD;  Location: [facility] ORS;  Service: Orthopedics;  Laterality: Right;    Home Medications:  Allergies as of 08/17/2021   No Known Allergies      Medication List        Accurate as of August 17, 2021 10:44 AM. If you have any questions, ask your nurse or doctor.          ALPRAZolam 0.5 MG tablet Commonly known as: XANAX Take 0.5 mg at bedtime by mouth. Anxiety   aspirin EC 81 MG tablet Take 1 tablet (81 mg total) by mouth daily. Swallow whole.   brimonidine 0.2 % ophthalmic solution Commonly known as: ALPHAGAN SMARTSIG:In Eye(s)   carboxymethylcellulose 0.5 % Soln Commonly known as: REFRESH PLUS Place 1 drop into the right eye daily as needed (dry eyes).   docusate sodium 100 MG capsule Commonly known as: COLACE Take 100 mg by mouth 2 (two) times daily.   FeroSul 325 (65 FE) MG tablet Generic drug: ferrous sulfate TAKE 1 TABLET ONCE DAILY.   FIBER COMPLETE PO Take by mouth in the morning and at bedtime.   FIBER-CAPS PO Take 1 capsule by mouth daily.   finasteride 5 MG tablet Commonly known as: PROSCAR Take 1 tablet (5 mg total) by mouth daily with supper.   furosemide 40 MG tablet Commonly known as: LASIX TAKE 1 TABLET ONCE DAILY.   metoprolol tartrate 25 MG tablet Commonly known as: LOPRESSOR Take 0.5 tablets (12.5 mg total) by mouth 2 (two) times daily.   mupirocin ointment 2 % Commonly known as: BACTROBAN Apply 1 application. topically 3 (three) times daily.   NON FORMULARY at bedtime. VPAP   potassium chloride 10 MEQ tablet Commonly known as: KLOR-CON M Take 10  mEq by mouth daily.   potassium chloride 10 MEQ tablet Commonly known as: KLOR-CON Take 10 mEq by mouth daily.   simvastatin 40 MG tablet Commonly known as: ZOCOR Take 40 mg by mouth at bedtime.   tamsulosin 0.4 MG Caps capsule Commonly known as: FLOMAX TAKE 1 CAPSULE BY MOUTH IN THE MORNING AND AT BEDTIME Refill pending follow up appointment.   TART CHERRY ADVANCED PO Take 1 tablet by mouth daily.   vitamin C 1000 MG tablet Take 1,000 mg by mouth daily.   Vitamin D3 50 MCG (2000 UT) Tabs Take 5,000 Units by mouth 2 (two) times daily.   Zinc 50 MG Tabs Take 50 mg by mouth daily.        Allergies: No Known Allergies  Family History: Family History  Problem Relation Age of Onset   Heart failure Mother  also arthritis   Stroke Father        also emphysema   Heart failure Father     Social History:  reports that he has never smoked. He has never used smokeless tobacco. He reports that he does not drink alcohol and does not use drugs.  ROS: All other review of systems were reviewed and are negative except what is noted above in HPI  Physical Exam: BP (!) 105/55   Pulse 64   Constitutional:  Alert and oriented, No acute distress. HEENT:  AT, moist mucus membranes.  Trachea midline, no masses. Cardiovascular: No clubbing, cyanosis, or edema. Respiratory: Normal respiratory effort, no increased work of breathing. GI: Abdomen is soft, nontender, nondistended, no abdominal masses GU: No CVA tenderness.  Lymph: No cervical or inguinal lymphadenopathy. Skin: No rashes, bruises or suspicious lesions. Neurologic: Grossly intact, no focal deficits, moving all 4 extremities. Psychiatric: Normal mood and affect.  Laboratory Data: Lab Results  Component Value Date   WBC 6.3 04/07/2021   HGB 12.9 (L) 04/07/2021   HCT 38.7 04/07/2021   MCV 88 04/07/2021   PLT 112 (L) 04/07/2021    Lab Results  Component Value Date   CREATININE 0.88 08/17/2020    No  results found for: PSA  No results found for: TESTOSTERONE  No results found for: HGBA1C  Urinalysis    Component Value Date/Time   COLORURINE YELLOW 06/26/2018 1045   APPEARANCEUR Clear 08/12/2020 1111   LABSPEC 1.021 06/26/2018 1045   PHURINE 6.0 06/26/2018 1045   GLUCOSEU Negative 08/12/2020 1111   HGBUR SMALL (A) 06/26/2018 1045   BILIRUBINUR Negative 08/12/2020 1111   KETONESUR NEGATIVE 06/26/2018 1045   PROTEINUR Negative 08/12/2020 1111   PROTEINUR NEGATIVE 06/26/2018 1045   UROBILINOGEN 0.2 08/14/2019 1151   UROBILINOGEN 0.2 08/15/2011 1248   NITRITE Negative 08/12/2020 1111   NITRITE POSITIVE (A) 06/26/2018 1045   LEUKOCYTESUR Negative 08/12/2020 1111    Lab Results  Component Value Date   LABMICR Comment 08/12/2020   BACTERIA MANY (A) 06/26/2018    Pertinent Imaging:  No results found for this or any previous visit.  No results found for this or any previous visit.  No results found for this or any previous visit.  No results found for this or any previous visit.  No results found for this or any previous visit.  No results found for this or any previous visit.  No results found for this or any previous visit.  No results found for this or any previous visit.   Assessment & Plan:    1. Benign localized prostatic hyperplasia with lower urinary tract symptoms (LUTS) -Continue flomax 0.4mg  BID and finasteride 5mg  daily - Urinalysis, Routine w reflex microscopic  2. Nocturia -Continue flomax 0.4mg  BID and finasteride 5mg  daily - Urinalysis, Routine w reflex microscopic   No follow-ups on file.  Wilkie Aye, MD  [facility] [place]

## 2021-08-18 LAB — URINALYSIS, ROUTINE W REFLEX MICROSCOPIC
Bilirubin, UA: NEGATIVE
Glucose, UA: NEGATIVE
Leukocytes,UA: NEGATIVE
Nitrite, UA: NEGATIVE
RBC, UA: NEGATIVE
Specific Gravity, UA: 1.025 (ref 1.005–1.030)
Urobilinogen, Ur: 1 mg/dL (ref 0.2–1.0)
pH, UA: 5.5 (ref 5.0–7.5)

## 2021-08-31 DIAGNOSIS — I739 Peripheral vascular disease, unspecified: Secondary | ICD-10-CM | POA: Diagnosis not present

## 2021-08-31 DIAGNOSIS — L110 Acquired keratosis follicularis: Secondary | ICD-10-CM | POA: Diagnosis not present

## 2021-08-31 DIAGNOSIS — B351 Tinea unguium: Secondary | ICD-10-CM | POA: Diagnosis not present

## 2021-09-06 ENCOUNTER — Other Ambulatory Visit: Payer: Self-pay | Admitting: Urology

## 2021-09-06 DIAGNOSIS — C44319 Basal cell carcinoma of skin of other parts of face: Secondary | ICD-10-CM | POA: Diagnosis not present

## 2021-09-06 DIAGNOSIS — N401 Enlarged prostate with lower urinary tract symptoms: Secondary | ICD-10-CM

## 2021-09-06 DIAGNOSIS — L570 Actinic keratosis: Secondary | ICD-10-CM | POA: Diagnosis not present

## 2021-09-06 DIAGNOSIS — C44329 Squamous cell carcinoma of skin of other parts of face: Secondary | ICD-10-CM | POA: Diagnosis not present

## 2021-09-14 ENCOUNTER — Other Ambulatory Visit: Payer: Self-pay | Admitting: Urology

## 2021-09-16 DIAGNOSIS — C44319 Basal cell carcinoma of skin of other parts of face: Secondary | ICD-10-CM | POA: Diagnosis not present

## 2021-09-23 IMAGING — CT CT HEAD W/O CM
4 series · 16 of 47 positions shown, 18 images · non-contrast
Comparison: Maxillofacial CT 04/26/2017.

CLINICAL DATA: Dizziness, nonspecific. Additional provided:
Dizziness onset yesterday, new onset of confusion this morning.

EXAM:
CT HEAD WITHOUT CONTRAST
TECHNIQUE: Contiguous axial images were obtained from the base of the skull
through the vertex without intravenous contrast.

[Series 2: head w o · axial · 0.53mm/px · z∈[+30,+150]mm · 7 of 34 slices shown, 9 images]
[im 5/34  brain]
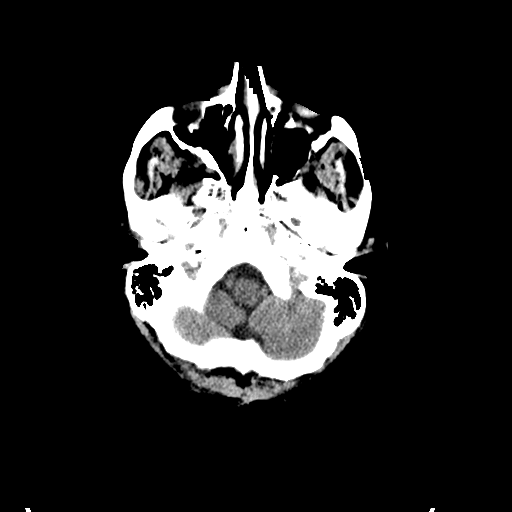
[im 5/34  bone]
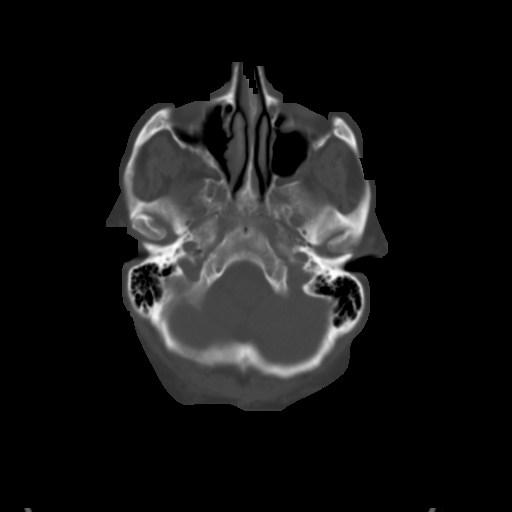
[im 9/34  brain]
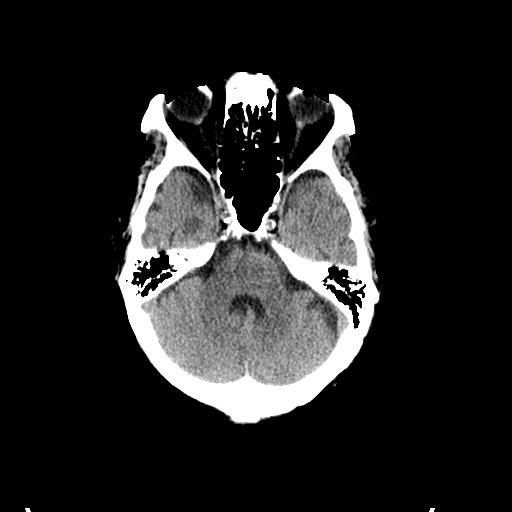
[im 13/34  brain]
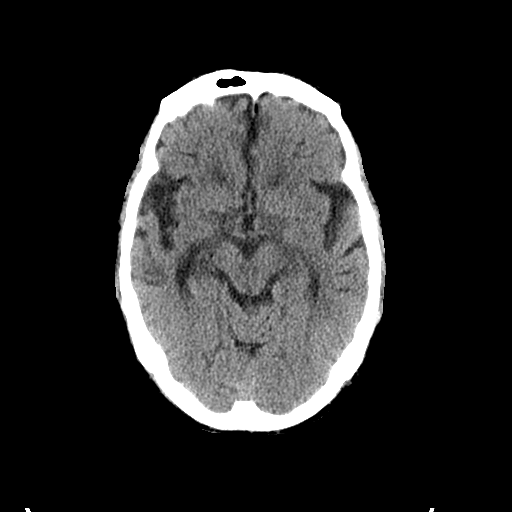
[im 17/34  brain]
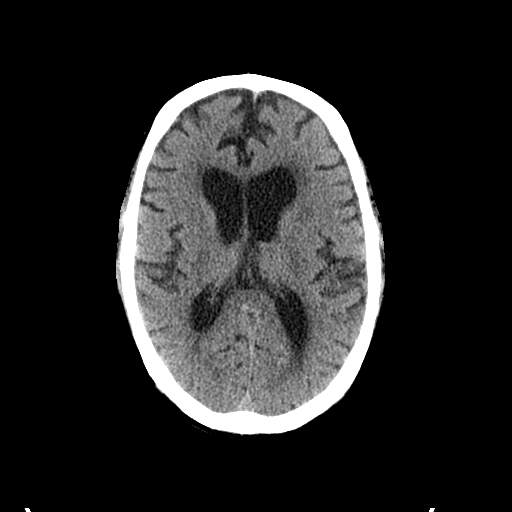
[im 21/34  brain]
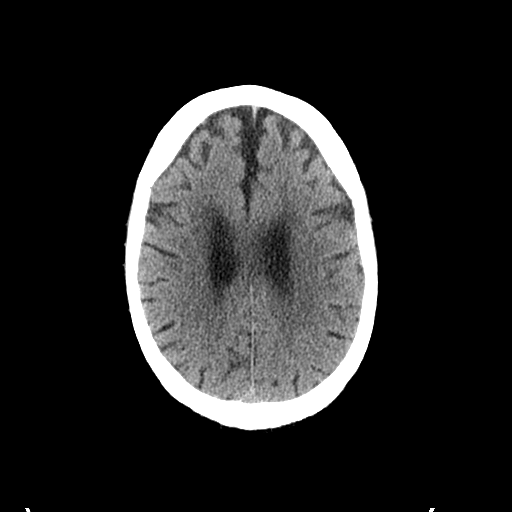
[im 21/34  bone]
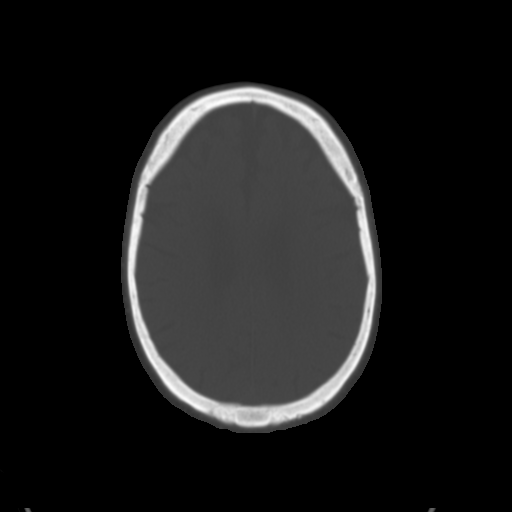
[im 25/34  brain]
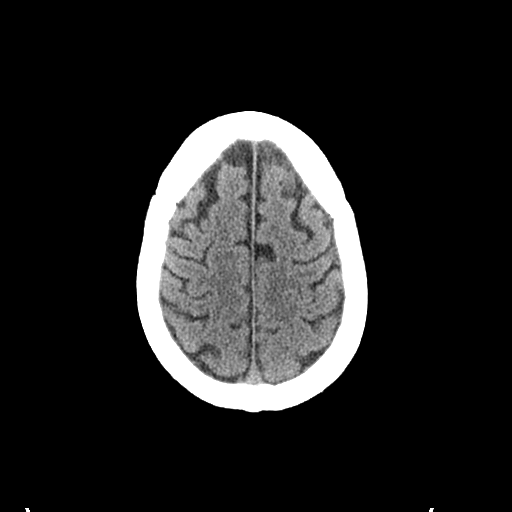
[im 29/34  brain]
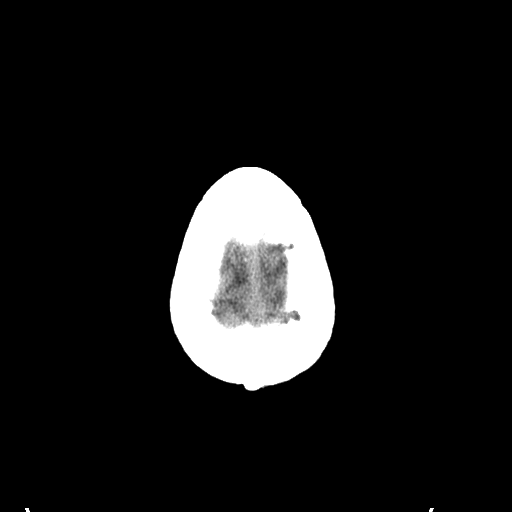

[Series 3: head bone · axial · 0.53mm/px · z∈[+26,+58]mm · 3 of 83 slices shown]
[im 9/83  bone]
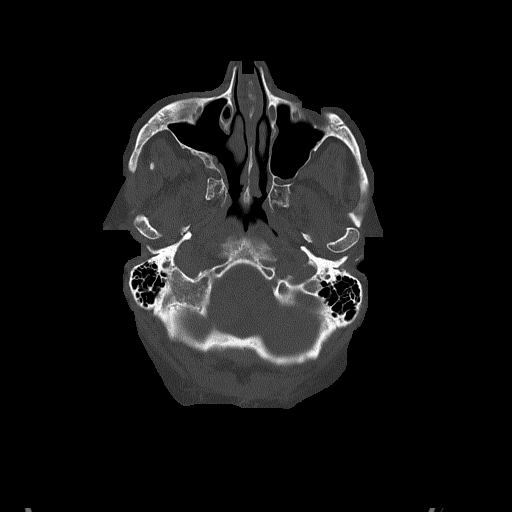
[im 17/83  bone]
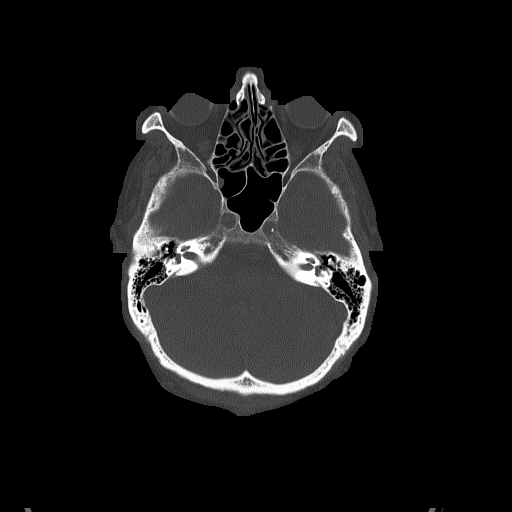
[im 25/83  bone]
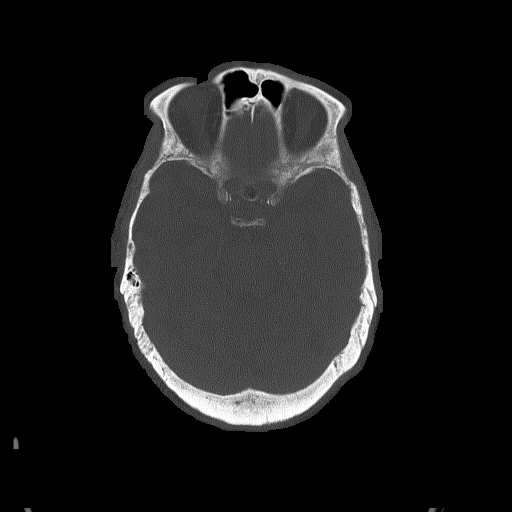

[Series 4: coronal soft · coronal · 0.34mm/px · 3 of 73 slices shown]
[im 25/73  brain]
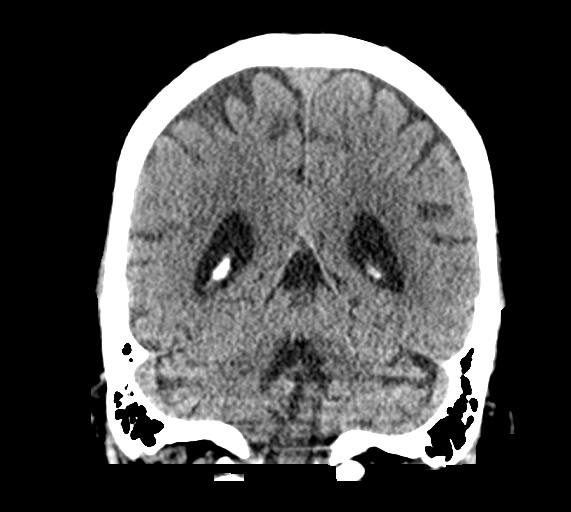
[im 33/73  brain]
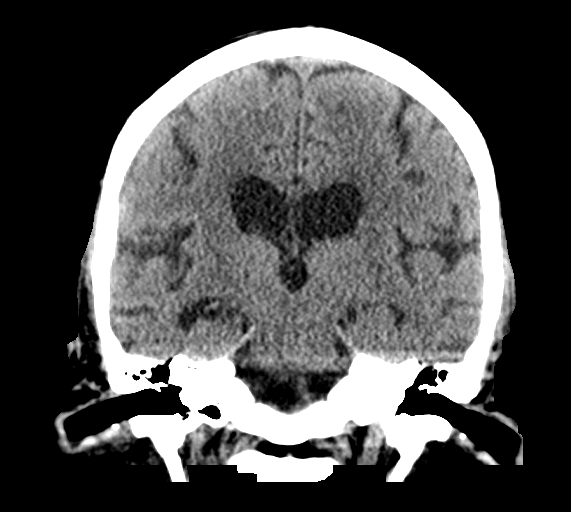
[im 41/73  brain]
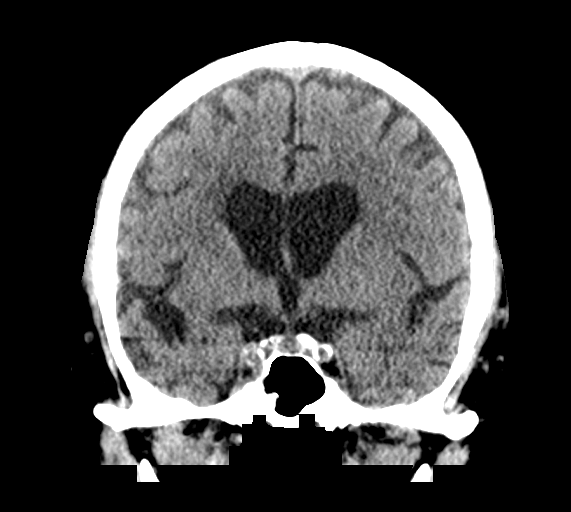

[Series 5: sagittal soft · sagittal · 0.35mm/px · 3 of 65 slices shown]
[im 22/65  brain]
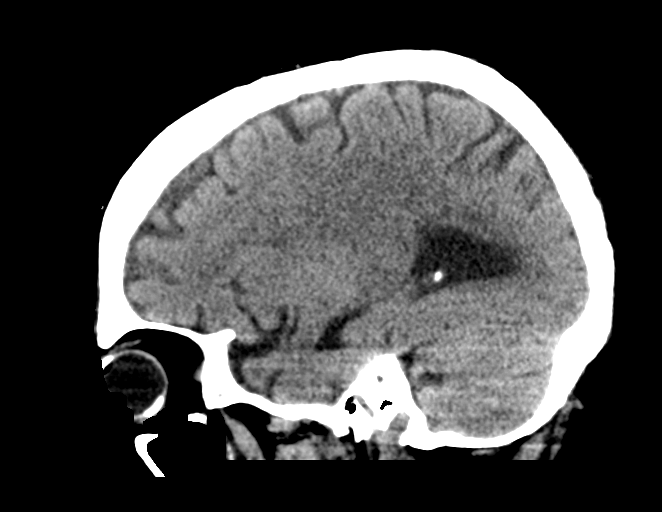
[im 33/65  brain]
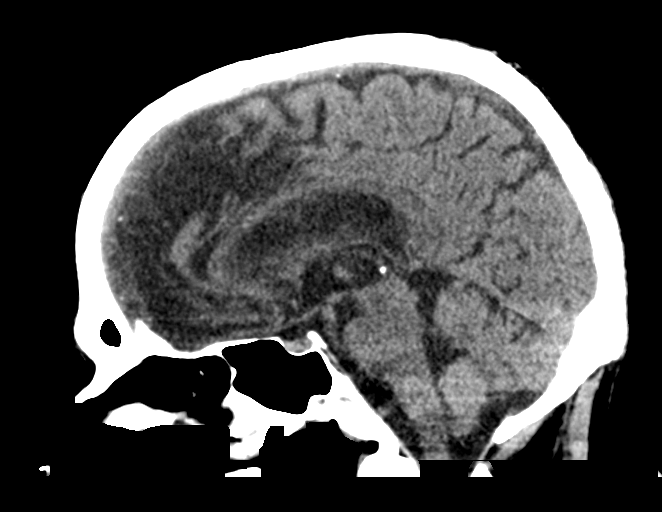
[im 43/65  brain]
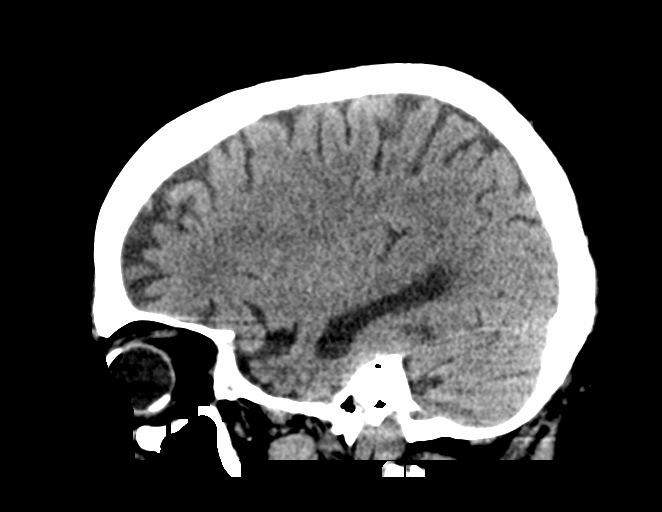

[16 of 47 positions shown; findings below may reference images not displayed]

FINDINGS: Brain:

Mild generalized cerebral atrophy with associated prominence of
ventricles and sulci.

Mild ill-defined hypoattenuation within the cerebral white matter is
nonspecific, but consistent with chronic small vessel ischemic
disease.

There is no acute intracranial hemorrhage.

No demarcated cortical infarct.

No extra-axial fluid collection.

No evidence of intracranial mass.

No midline shift.

Vascular: No hyperdense vessel.  Atherosclerotic calcifications.

Skull: Normal. Negative for fracture or focal lesion.

Sinuses/Orbits: Postprocedural changes to the globes. Postoperative
appearance of the paranasal sinuses. Mild ethmoid and right sphenoid
sinus mucosal thickening. No significant mastoid effusion.
IMPRESSION: No evidence of acute intracranial abnormality.

Mild generalized cerebral atrophy and chronic small vessel ischemic
disease.

Mild ethmoid and right sphenoid sinus mucosal thickening.

## 2021-10-03 DIAGNOSIS — E782 Mixed hyperlipidemia: Secondary | ICD-10-CM | POA: Diagnosis not present

## 2021-10-03 DIAGNOSIS — I4891 Unspecified atrial fibrillation: Secondary | ICD-10-CM | POA: Diagnosis not present

## 2021-10-04 DIAGNOSIS — G4733 Obstructive sleep apnea (adult) (pediatric): Secondary | ICD-10-CM | POA: Diagnosis not present

## 2021-10-08 DIAGNOSIS — Z6835 Body mass index (BMI) 35.0-35.9, adult: Secondary | ICD-10-CM | POA: Diagnosis not present

## 2021-10-08 DIAGNOSIS — E6609 Other obesity due to excess calories: Secondary | ICD-10-CM | POA: Diagnosis not present

## 2021-10-08 DIAGNOSIS — L600 Ingrowing nail: Secondary | ICD-10-CM | POA: Diagnosis not present

## 2021-10-10 ENCOUNTER — Other Ambulatory Visit: Payer: Self-pay | Admitting: Urology

## 2021-10-18 DIAGNOSIS — L110 Acquired keratosis follicularis: Secondary | ICD-10-CM | POA: Diagnosis not present

## 2021-10-18 DIAGNOSIS — B351 Tinea unguium: Secondary | ICD-10-CM | POA: Diagnosis not present

## 2021-10-18 DIAGNOSIS — I739 Peripheral vascular disease, unspecified: Secondary | ICD-10-CM | POA: Diagnosis not present

## 2021-11-02 DIAGNOSIS — M79675 Pain in left toe(s): Secondary | ICD-10-CM | POA: Diagnosis not present

## 2021-11-02 DIAGNOSIS — M79672 Pain in left foot: Secondary | ICD-10-CM | POA: Diagnosis not present

## 2021-11-02 DIAGNOSIS — L03032 Cellulitis of left toe: Secondary | ICD-10-CM | POA: Diagnosis not present

## 2021-11-09 DIAGNOSIS — L110 Acquired keratosis follicularis: Secondary | ICD-10-CM | POA: Diagnosis not present

## 2021-11-09 DIAGNOSIS — B351 Tinea unguium: Secondary | ICD-10-CM | POA: Diagnosis not present

## 2021-11-09 DIAGNOSIS — I739 Peripheral vascular disease, unspecified: Secondary | ICD-10-CM | POA: Diagnosis not present

## 2021-11-25 ENCOUNTER — Encounter (INDEPENDENT_AMBULATORY_CARE_PROVIDER_SITE_OTHER): Payer: Medicare Other | Admitting: Ophthalmology

## 2021-11-25 DIAGNOSIS — D3131 Benign neoplasm of right choroid: Secondary | ICD-10-CM

## 2021-11-25 DIAGNOSIS — I10 Essential (primary) hypertension: Secondary | ICD-10-CM

## 2021-11-25 DIAGNOSIS — H338 Other retinal detachments: Secondary | ICD-10-CM | POA: Diagnosis not present

## 2021-11-25 DIAGNOSIS — H35033 Hypertensive retinopathy, bilateral: Secondary | ICD-10-CM | POA: Diagnosis not present

## 2021-11-25 DIAGNOSIS — H43811 Vitreous degeneration, right eye: Secondary | ICD-10-CM

## 2021-11-25 DIAGNOSIS — H35373 Puckering of macula, bilateral: Secondary | ICD-10-CM

## 2021-11-25 DIAGNOSIS — H401132 Primary open-angle glaucoma, bilateral, moderate stage: Secondary | ICD-10-CM | POA: Diagnosis not present

## 2022-01-02 ENCOUNTER — Other Ambulatory Visit: Payer: Self-pay | Admitting: Urology

## 2022-01-10 DIAGNOSIS — N183 Chronic kidney disease, stage 3 unspecified: Secondary | ICD-10-CM | POA: Diagnosis not present

## 2022-01-10 DIAGNOSIS — Z0001 Encounter for general adult medical examination with abnormal findings: Secondary | ICD-10-CM | POA: Diagnosis not present

## 2022-01-10 DIAGNOSIS — E782 Mixed hyperlipidemia: Secondary | ICD-10-CM | POA: Diagnosis not present

## 2022-01-10 DIAGNOSIS — Z6835 Body mass index (BMI) 35.0-35.9, adult: Secondary | ICD-10-CM | POA: Diagnosis not present

## 2022-01-10 DIAGNOSIS — Z1331 Encounter for screening for depression: Secondary | ICD-10-CM | POA: Diagnosis not present

## 2022-01-18 DIAGNOSIS — I739 Peripheral vascular disease, unspecified: Secondary | ICD-10-CM | POA: Diagnosis not present

## 2022-01-18 DIAGNOSIS — L110 Acquired keratosis follicularis: Secondary | ICD-10-CM | POA: Diagnosis not present

## 2022-01-18 DIAGNOSIS — B351 Tinea unguium: Secondary | ICD-10-CM | POA: Diagnosis not present

## 2022-02-16 ENCOUNTER — Telehealth: Payer: Self-pay | Admitting: *Deleted

## 2022-02-16 ENCOUNTER — Encounter: Payer: Self-pay | Admitting: *Deleted

## 2022-02-16 NOTE — Patient Outreach (Signed)
  Care Coordination   Initial Visit Note   02/16/2022 Name: Paul Lam MRN: [id number] DOB: [birth year]  Paul Lam is a 81 y.o. year old male who sees [name], MD for primary care. I  spoke with wife, [name], by telephone.   What matters to the patients health and wellness today?  Ongoing self management of chronic medical conditions     Goals Addressed             This Visit's Progress    COMPLETED: Care Coordination Services (no follow-up required)       Care Coordination Interventions: Reviewed medications with patient and discussed affordability. No difficulty at this time. Assessed social determinant of health barriers Assessed mobility and ability to perform ADLs Assessed family/Social support Provided patient/caregiver with verbal information on [place] [phone]) Encouraged patient to request a referral for [place] from PCP if services are needed in the future         SDOH assessments and interventions completed:  Yes  SDOH Interventions Today    Flowsheet Row Most Recent Value  SDOH Interventions   Housing Interventions Intervention Not Indicated  Transportation Interventions Intervention Not Indicated  Utilities Interventions Intervention Not Indicated  Financial Strain Interventions Intervention Not Indicated        Care Coordination Interventions Activated:  Yes  Care Coordination Interventions:  Yes, provided   Follow up plan: No further intervention required.   Encounter Outcome:  Pt. Visit Completed   Chong Sicilian, BSN, RN-BC RN Care Coordinator [place] Direct Dial: [phone] Main #: [phone]

## 2022-03-03 ENCOUNTER — Other Ambulatory Visit: Payer: Self-pay | Admitting: Urology

## 2022-03-03 ENCOUNTER — Other Ambulatory Visit: Payer: Self-pay | Admitting: Internal Medicine

## 2022-03-08 DIAGNOSIS — D485 Neoplasm of uncertain behavior of skin: Secondary | ICD-10-CM | POA: Diagnosis not present

## 2022-03-08 DIAGNOSIS — L570 Actinic keratosis: Secondary | ICD-10-CM | POA: Diagnosis not present

## 2022-03-08 DIAGNOSIS — C44311 Basal cell carcinoma of skin of nose: Secondary | ICD-10-CM | POA: Diagnosis not present

## 2022-03-17 DIAGNOSIS — C44311 Basal cell carcinoma of skin of nose: Secondary | ICD-10-CM | POA: Diagnosis not present

## 2022-04-04 DIAGNOSIS — Z961 Presence of intraocular lens: Secondary | ICD-10-CM | POA: Diagnosis not present

## 2022-04-04 DIAGNOSIS — H401131 Primary open-angle glaucoma, bilateral, mild stage: Secondary | ICD-10-CM | POA: Diagnosis not present

## 2022-04-04 DIAGNOSIS — H338 Other retinal detachments: Secondary | ICD-10-CM | POA: Diagnosis not present

## 2022-04-04 DIAGNOSIS — H524 Presbyopia: Secondary | ICD-10-CM | POA: Diagnosis not present

## 2022-04-05 DIAGNOSIS — I739 Peripheral vascular disease, unspecified: Secondary | ICD-10-CM | POA: Diagnosis not present

## 2022-04-05 DIAGNOSIS — L110 Acquired keratosis follicularis: Secondary | ICD-10-CM | POA: Diagnosis not present

## 2022-04-05 DIAGNOSIS — B351 Tinea unguium: Secondary | ICD-10-CM | POA: Diagnosis not present

## 2022-04-07 ENCOUNTER — Other Ambulatory Visit: Payer: Self-pay | Admitting: Urology

## 2022-05-03 ENCOUNTER — Other Ambulatory Visit: Payer: Self-pay | Admitting: Urology

## 2022-05-20 DIAGNOSIS — G4733 Obstructive sleep apnea (adult) (pediatric): Secondary | ICD-10-CM | POA: Diagnosis not present

## 2022-05-25 DIAGNOSIS — Z6836 Body mass index (BMI) 36.0-36.9, adult: Secondary | ICD-10-CM | POA: Diagnosis not present

## 2022-05-25 DIAGNOSIS — J069 Acute upper respiratory infection, unspecified: Secondary | ICD-10-CM | POA: Diagnosis not present

## 2022-05-25 DIAGNOSIS — E6609 Other obesity due to excess calories: Secondary | ICD-10-CM | POA: Diagnosis not present

## 2022-05-26 ENCOUNTER — Encounter (INDEPENDENT_AMBULATORY_CARE_PROVIDER_SITE_OTHER): Payer: Medicare Other | Admitting: Ophthalmology

## 2022-05-26 DIAGNOSIS — D3131 Benign neoplasm of right choroid: Secondary | ICD-10-CM | POA: Diagnosis not present

## 2022-05-26 DIAGNOSIS — I10 Essential (primary) hypertension: Secondary | ICD-10-CM

## 2022-05-26 DIAGNOSIS — H43811 Vitreous degeneration, right eye: Secondary | ICD-10-CM

## 2022-05-26 DIAGNOSIS — H35033 Hypertensive retinopathy, bilateral: Secondary | ICD-10-CM | POA: Diagnosis not present

## 2022-05-26 DIAGNOSIS — H338 Other retinal detachments: Secondary | ICD-10-CM

## 2022-05-26 DIAGNOSIS — H35373 Puckering of macula, bilateral: Secondary | ICD-10-CM | POA: Diagnosis not present

## 2022-05-31 ENCOUNTER — Emergency Department (HOSPITAL_COMMUNITY): Payer: Medicare Other

## 2022-05-31 ENCOUNTER — Other Ambulatory Visit: Payer: Self-pay

## 2022-05-31 ENCOUNTER — Emergency Department (HOSPITAL_COMMUNITY)
Admission: EM | Admit: 2022-05-31 | Discharge: 2022-05-31 | Disposition: A | Discharge status: Home / Self Care | Payer: Medicare Other | Attending: Emergency Medicine | Admitting: Emergency Medicine

## 2022-05-31 DIAGNOSIS — Z1152 Encounter for screening for COVID-19: Secondary | ICD-10-CM | POA: Insufficient documentation

## 2022-05-31 DIAGNOSIS — J069 Acute upper respiratory infection, unspecified: Secondary | ICD-10-CM | POA: Diagnosis not present

## 2022-05-31 DIAGNOSIS — R0981 Nasal congestion: Secondary | ICD-10-CM | POA: Diagnosis present

## 2022-05-31 DIAGNOSIS — R059 Cough, unspecified: Secondary | ICD-10-CM | POA: Diagnosis not present

## 2022-05-31 DIAGNOSIS — B974 Respiratory syncytial virus as the cause of diseases classified elsewhere: Secondary | ICD-10-CM | POA: Insufficient documentation

## 2022-05-31 DIAGNOSIS — J9811 Atelectasis: Secondary | ICD-10-CM | POA: Diagnosis not present

## 2022-05-31 LAB — CBC WITH DIFFERENTIAL/PLATELET
Abs Immature Granulocytes: 0.01 10*3/uL (ref 0.00–0.07)
Basophils Absolute: 0 10*3/uL (ref 0.0–0.1)
Basophils Relative: 1 %
Eosinophils Absolute: 0.3 10*3/uL (ref 0.0–0.5)
Eosinophils Relative: 6 %
HCT: 41.2 % (ref 39.0–52.0)
Hemoglobin: 13.3 g/dL (ref 13.0–17.0)
Immature Granulocytes: 0 %
Lymphocytes Relative: 29 %
Lymphs Abs: 1.3 10*3/uL (ref 0.7–4.0)
MCH: 29.6 pg (ref 26.0–34.0)
MCHC: 32.3 g/dL (ref 30.0–36.0)
MCV: 91.6 fL (ref 80.0–100.0)
Monocytes Absolute: 0.4 10*3/uL (ref 0.1–1.0)
Monocytes Relative: 8 %
Neutro Abs: 2.5 10*3/uL (ref 1.7–7.7)
Neutrophils Relative %: 56 %
Platelets: 99 10*3/uL — ABNORMAL LOW (ref 150–400)
RBC: 4.5 MIL/uL (ref 4.22–5.81)
RDW: 13.1 % (ref 11.5–15.5)
WBC Morphology: REACTIVE
WBC: 4.4 10*3/uL (ref 4.0–10.5)
nRBC: 0 % (ref 0.0–0.2)

## 2022-05-31 LAB — BASIC METABOLIC PANEL
Anion gap: 6 (ref 5–15)
BUN: 24 mg/dL — ABNORMAL HIGH (ref 8–23)
CO2: 29 mmol/L (ref 22–32)
Calcium: 8.1 mg/dL — ABNORMAL LOW (ref 8.9–10.3)
Chloride: 104 mmol/L (ref 98–111)
Creatinine, Ser: 0.86 mg/dL (ref 0.61–1.24)
GFR, Estimated: >60 mL/min (ref >60)
Glucose, Bld: 107 mg/dL — ABNORMAL HIGH (ref 70–99)
Potassium: 3.7 mmol/L (ref 3.5–5.1)
Sodium: 139 mmol/L (ref 135–145)

## 2022-05-31 LAB — RESP PANEL BY RT-PCR (RSV, FLU A&B, COVID)  RVPGX2
Influenza A by PCR: NEGATIVE
Influenza B by PCR: NEGATIVE
Resp Syncytial Virus by PCR: POSITIVE — AB
SARS Coronavirus 2 by RT PCR: NEGATIVE

## 2022-05-31 NOTE — ED Provider Notes (Signed)
[facility] EMERGENCY DEPARTMENT Provider Note   CSN: [id number] Arrival date & time: 05/31/22  9371     History Chief Complaint  Patient presents with   Nasal Congestion    HPI Paul Lam is a 81 y.o. male presenting for 3 days of fever cough congestion.  Patient's granddaughter is sick.  Patient has an extensive medical history.  Worried about upper respiratory infection.  Otherwise ambulatory tolerating p.o. intake.  Was told by his PCP to get evaluated for pneumonia..   Patient's recorded medical, surgical, social, medication list and allergies were reviewed in the Snapshot window as part of the initial history.   Review of Systems   Review of Systems  Constitutional:  Positive for fatigue. Negative for chills and fever.  HENT:  Positive for congestion. Negative for ear pain and sore throat.   Eyes:  Negative for pain and visual disturbance.  Respiratory:  Positive for cough. Negative for shortness of breath.   Cardiovascular:  Negative for chest pain and palpitations.  Gastrointestinal:  Negative for abdominal pain and vomiting.  Genitourinary:  Negative for dysuria and hematuria.  Musculoskeletal:  Negative for arthralgias and back pain.  Skin:  Negative for color change and rash.  Neurological:  Negative for seizures and syncope.  All other systems reviewed and are negative.   Physical Exam Updated Vital Signs BP (!) 136/101   Pulse (!) 45   Temp 97.8 F (36.6 C) (Oral)   Resp 17   Ht '5\' 8"'$  (1.727 m)   Wt 104.8 kg   SpO2 98%   BMI 35.13 kg/m  Physical Exam Vitals and nursing note reviewed.  Constitutional:      General: He is not in acute distress.    Appearance: He is well-developed.  HENT:     Head: Normocephalic and atraumatic.  Eyes:     Conjunctiva/sclera: Conjunctivae normal.  Cardiovascular:     Rate and Rhythm: Normal rate and regular rhythm.     Heart sounds: No murmur heard. Pulmonary:     Effort: Pulmonary effort is normal. No  respiratory distress.     Breath sounds: Normal breath sounds.  Abdominal:     Palpations: Abdomen is soft.     Tenderness: There is no abdominal tenderness.  Musculoskeletal:        General: No swelling.     Cervical back: Neck supple.  Skin:    General: Skin is warm and dry.     Capillary Refill: Capillary refill takes less than 2 seconds.  Neurological:     Mental Status: He is alert.  Psychiatric:        Mood and Affect: Mood normal.      ED Course/ Medical Decision Making/ A&P Clinical Course as of 05/31/22 1332  Tue May 31, 2022  1218 RSV/ Labs OK [CC]    Clinical Course User Index [CC] Tretha Sciara, MD    Procedures Procedures   Medications Ordered in ED Medications - No data to display Medical Decision Making:   Paul Lam is a 81 y.o. male who presented to the ED today with subjective fever, cough, congestion detailed above.    Additional history discussed with patient's family/caregivers.  Patient's presentation is complicated by their history of advanced age, multiple comorbid medical problems.  Patient placed on continuous vitals and telemetry monitoring while in ED which was reviewed periodically.   Complete initial physical exam performed, notably the patient  was hemodynamically stable in no acute distress.  Posterior oropharynx  illuminated and without obvious swelling or deformity.  Patient is without neck stiffness.    Reviewed and confirmed nursing documentation for past medical history, family history, social history.    Initial Assessment:   With the patient's presentation of fever cough congestion, most likely diagnosis is developing viral upper respiratory infection. Other diagnoses were considered including (but not limited to) peritonsillar abscess, retropharyngeal abscess, pneumonia. These are considered less likely due to history of present illness and physical exam findings.   Considered meningitis, however patient's symptoms, vital  signs, physical exam findings including lack of meningismus seem grossly less consistent at this time. Initial Plan:  Screening labs including CBC and Metabolic panel to evaluate for infectious or metabolic etiology of disease.  Viral screening including COVID/flu testing to evaluate for common viral etiologies that need to be tracked CXR to evaluate for structural/infectious intrathoracic pathology.  Empiric treatment with antipyretics including acetaminophen in ambulatory setting Objective evaluation as below reviewed   Initial Study Results:   Laboratory  All laboratory results reviewed without evidence of clinically relevant pathology.     Radiology:  All images reviewed independently. Agree with radiology report at this time.   DG Chest Portable 1 View  Final Result         Final Assessment and Plan:   On reassessment, patient is ambulatory tolerating p.o. intake in no acute distress.   Discussed findings today with patient and patient's wife.  Viral upper respiratory infection likely second RSV and family contacts with similar.  Observed in the emergency department for extended amount of time with no respiratory decompensation or any other emergent pathology identified.  Given well appearance, ambulation status, toleration of p.o. intake, he is stable for outpatient care management at this time.  Patient is currently stable for outpatient care and management with no indication for hospitalization or transfer at this time.  Discussed all findings with patient expressed understanding.  Disposition:  Based on the above findings, I believe patient is stable for discharge.    Patient/family educated about specific return precautions for given chief complaint and symptoms.  Patient/family educated about follow-up with PCP.     Patient/family expressed understanding of return precautions and need for follow-up. Patient spoken to regarding all imaging and laboratory results and  appropriate follow up for these results. All education provided in verbal form with additional information in written form. Time was allowed for answering of patient questions. Patient discharged.    Emergency Department Medication Summary:   Medications - No data to display         Clinical Impression:  1. Upper respiratory tract infection, unspecified type      Discharge   Clinical Impression:  1. Upper respiratory tract infection, unspecified type      Discharge   Final Clinical Impression(s) / ED Diagnoses Final diagnoses:  Upper respiratory tract infection, unspecified type    Rx / DC Orders ED Discharge Orders     None         Tretha Sciara, MD 05/31/22 1332

## 2022-05-31 NOTE — ED Triage Notes (Signed)
Pt c/o congestion, cough, sob that is worse when laying down since wednesday last week

## 2022-06-08 ENCOUNTER — Telehealth: Payer: Self-pay

## 2022-06-08 NOTE — Telephone Encounter (Signed)
        Patient  visited [facility] on 12/26     Telephone encounter attempt :  1st  A HIPAA compliant voice message was left requesting a return call.  Instructed patient to call back .    [facility], Care Management  [phone] [address], [place], [place] [address] Phone: [phone] Email: [email or web address]

## 2022-06-09 ENCOUNTER — Telehealth: Payer: Self-pay

## 2022-06-09 NOTE — Telephone Encounter (Signed)
        Patient  visited [facility] on 12/26    Telephone encounter attempt :  2nd  A HIPAA compliant voice message was left requesting a return call.  Instructed patient to call back .    [facility], Care Management  [phone] [address], [place], [place] [address] Phone: [phone] Email: [email or web address]

## 2022-06-21 DIAGNOSIS — L110 Acquired keratosis follicularis: Secondary | ICD-10-CM | POA: Diagnosis not present

## 2022-06-21 DIAGNOSIS — B351 Tinea unguium: Secondary | ICD-10-CM | POA: Diagnosis not present

## 2022-06-21 DIAGNOSIS — I739 Peripheral vascular disease, unspecified: Secondary | ICD-10-CM | POA: Diagnosis not present

## 2022-06-22 DIAGNOSIS — K080 Exfoliation of teeth due to systemic causes: Secondary | ICD-10-CM | POA: Diagnosis not present

## 2022-06-29 ENCOUNTER — Other Ambulatory Visit: Payer: Self-pay | Admitting: Urology

## 2022-07-04 DIAGNOSIS — F419 Anxiety disorder, unspecified: Secondary | ICD-10-CM | POA: Diagnosis not present

## 2022-07-04 DIAGNOSIS — E6609 Other obesity due to excess calories: Secondary | ICD-10-CM | POA: Diagnosis not present

## 2022-07-04 DIAGNOSIS — Z6836 Body mass index (BMI) 36.0-36.9, adult: Secondary | ICD-10-CM | POA: Diagnosis not present

## 2022-07-04 DIAGNOSIS — D696 Thrombocytopenia, unspecified: Secondary | ICD-10-CM | POA: Diagnosis not present

## 2022-07-06 DIAGNOSIS — I4891 Unspecified atrial fibrillation: Secondary | ICD-10-CM | POA: Diagnosis not present

## 2022-07-06 DIAGNOSIS — K080 Exfoliation of teeth due to systemic causes: Secondary | ICD-10-CM | POA: Diagnosis not present

## 2022-07-06 DIAGNOSIS — E782 Mixed hyperlipidemia: Secondary | ICD-10-CM | POA: Diagnosis not present

## 2022-07-07 DIAGNOSIS — D239 Other benign neoplasm of skin, unspecified: Secondary | ICD-10-CM | POA: Diagnosis not present

## 2022-07-07 DIAGNOSIS — L03032 Cellulitis of left toe: Secondary | ICD-10-CM | POA: Diagnosis not present

## 2022-07-07 DIAGNOSIS — D485 Neoplasm of uncertain behavior of skin: Secondary | ICD-10-CM | POA: Diagnosis not present

## 2022-07-07 DIAGNOSIS — M79672 Pain in left foot: Secondary | ICD-10-CM | POA: Diagnosis not present

## 2022-07-07 DIAGNOSIS — M79675 Pain in left toe(s): Secondary | ICD-10-CM | POA: Diagnosis not present

## 2022-07-07 DIAGNOSIS — D0439 Carcinoma in situ of skin of other parts of face: Secondary | ICD-10-CM | POA: Diagnosis not present

## 2022-07-14 DIAGNOSIS — C44329 Squamous cell carcinoma of skin of other parts of face: Secondary | ICD-10-CM | POA: Diagnosis not present

## 2022-07-19 ENCOUNTER — Telehealth: Payer: Self-pay

## 2022-07-19 NOTE — Telephone Encounter (Signed)
Called to check in with patient, who had [place] on 07/09/2020. The patient reports doing well with no issues.  The patient understands to call with questions or concerns.

## 2022-07-25 DIAGNOSIS — L03032 Cellulitis of left toe: Secondary | ICD-10-CM | POA: Diagnosis not present

## 2022-07-25 DIAGNOSIS — M79675 Pain in left toe(s): Secondary | ICD-10-CM | POA: Diagnosis not present

## 2022-07-25 DIAGNOSIS — M79672 Pain in left foot: Secondary | ICD-10-CM | POA: Diagnosis not present

## 2022-07-30 ENCOUNTER — Other Ambulatory Visit: Payer: Self-pay | Admitting: Urology

## 2022-07-30 DIAGNOSIS — N401 Enlarged prostate with lower urinary tract symptoms: Secondary | ICD-10-CM

## 2022-08-08 DIAGNOSIS — L03032 Cellulitis of left toe: Secondary | ICD-10-CM | POA: Diagnosis not present

## 2022-08-08 DIAGNOSIS — M79672 Pain in left foot: Secondary | ICD-10-CM | POA: Diagnosis not present

## 2022-08-08 DIAGNOSIS — L600 Ingrowing nail: Secondary | ICD-10-CM | POA: Diagnosis not present

## 2022-08-08 DIAGNOSIS — M79675 Pain in left toe(s): Secondary | ICD-10-CM | POA: Diagnosis not present

## 2022-08-15 DIAGNOSIS — K080 Exfoliation of teeth due to systemic causes: Secondary | ICD-10-CM | POA: Diagnosis not present

## 2022-08-19 ENCOUNTER — Ambulatory Visit: Payer: Medicare Other | Admitting: Urology

## 2022-08-19 ENCOUNTER — Encounter: Payer: Self-pay | Admitting: Urology

## 2022-08-19 VITALS — BP 113/71 | HR 85

## 2022-08-19 DIAGNOSIS — N5201 Erectile dysfunction due to arterial insufficiency: Secondary | ICD-10-CM

## 2022-08-19 DIAGNOSIS — N401 Enlarged prostate with lower urinary tract symptoms: Secondary | ICD-10-CM

## 2022-08-19 DIAGNOSIS — R351 Nocturia: Secondary | ICD-10-CM | POA: Diagnosis not present

## 2022-08-19 LAB — URINALYSIS, ROUTINE W REFLEX MICROSCOPIC
Bilirubin, UA: NEGATIVE
Glucose, UA: NEGATIVE
Ketones, UA: NEGATIVE
Leukocytes,UA: NEGATIVE
Nitrite, UA: NEGATIVE
Protein,UA: NEGATIVE
RBC, UA: NEGATIVE
Specific Gravity, UA: 1.01 (ref 1.005–1.030)
Urobilinogen, Ur: 0.2 mg/dL (ref 0.2–1.0)
pH, UA: 5.5 (ref 5.0–7.5)

## 2022-08-19 MED ORDER — TAMSULOSIN HCL 0.4 MG PO CAPS: 0.4000 mg | ORAL_CAPSULE | Freq: Two times a day (BID) | ORAL | 3 refills | Status: DC

## 2022-08-19 MED ORDER — TADALAFIL 20 MG PO TABS: 20.0000 mg | ORAL_TABLET | Freq: Every day | ORAL | 5 refills | Status: DC | PRN

## 2022-08-19 MED ORDER — FINASTERIDE 5 MG PO TABS: 5.0000 mg | ORAL_TABLET | Freq: Every day | ORAL | 3 refills | Status: DC

## 2022-08-19 NOTE — Patient Instructions (Signed)

## 2022-08-19 NOTE — Progress Notes (Signed)
08/19/2022 10:57 AM   Clair Gulling Marchese May 30, 1941 [id number]  Referring provider: [name], MD [address] [place],  [place] [address]  Followup BPH and nocturia   HPI: Mr Schwarz is a 82yo here for followup for BPH with nocturia. IPSS 9 QOL 1 on flomax BID and finasteride. Nocturia 1x. Urine stream fair. No straining to urinate. He has significant lower extremity edema. He has issues with a nonhealing wound on his left foot. He has difficulty getting an erection. He has never taken a PDE5.    PMH: Past Medical History:  Diagnosis Date   Anginal pain ([place])    Anxiety    Arthritis    generalized arthritis    Atrial fibrillation (HCC)    CAD (coronary artery disease)    Cancer (HCC)    skin cancer removed left ear     Complication of anesthesia    Depression    Headache    History of echocardiogram 02/19/2009   EF 50-55%; LA mild-mod dilated;    History of kidney stones    History of nuclear stress test 02/19/2009   low risk, normal    Hyperlipidemia    Hypertension    Obesity (BMI 30-39.9)    OSA (obstructive sleep apnea)    severe with AHI 25/hr now on BiPAP   PONV (postoperative nausea and vomiting)    Retinal detachment    with proliferative vitreoretinopathy   Shortness of breath    related to weight    Sleep apnea    Vertigo     Surgical History: Past Surgical History:  Procedure Laterality Date   APPENDECTOMY     BIOPSY  03/05/2020   Procedure: BIOPSY;  Surgeon: Rogene Houston, MD;  [facility];  Service: Endoscopy;;   CARDIAC CATHETERIZATION  07/13/2005   noncritical CAD   CARDIOVERSION N/A 01/29/2018   Procedure: CARDIOVERSION;  Surgeon: Dorothy Spark, MD;  Location: [facility];  Service: Cardiovascular;  Laterality: N/A;   COLONOSCOPY WITH PROPOFOL N/A 03/06/2020   Procedure: COLONOSCOPY WITH PROPOFOL;  Surgeon: Harvel Quale, MD;  [facility];  Service: Gastroenterology;  Laterality: N/A;    ESOPHAGOGASTRODUODENOSCOPY (EGD) WITH PROPOFOL N/A 03/05/2020   Procedure: ESOPHAGOGASTRODUODENOSCOPY (EGD) WITH PROPOFOL;  Surgeon: Rogene Houston, MD;  [facility];  Service: Endoscopy;  Laterality: N/A;   EYE SURGERY     FLEXIBLE SIGMOIDOSCOPY N/A 07/14/2015   Procedure: FLEXIBLE SIGMOIDOSCOPY;  Surgeon: Aviva Signs, MD;  [facility];  Service: Gastroenterology;  Laterality: N/A;   GIVENS CAPSULE STUDY N/A 04/15/2020   Procedure: GIVENS CAPSULE STUDY;  Surgeon: Harvel Quale, MD;  [facility];  Service: Gastroenterology;  Laterality: N/A;  730   JOINT REPLACEMENT     LEFT ATRIAL APPENDAGE OCCLUSION N/A 07/09/2020   Procedure: LEFT ATRIAL APPENDAGE OCCLUSION;  Surgeon: Sherren Mocha, MD;  Location: [facility];  Service: Cardiovascular;  Laterality: N/A;   NASAL SEPTOPLASTY W/ TURBINOPLASTY Bilateral 12/19/2017   Procedure: NASAL SEPTOPLASTY WITH TURBINATE REDUCTION;  Surgeon: [name], MD;  Location: [facility];  Service: ENT;  Laterality: Bilateral;   NASAL SINUS SURGERY     PARS PLANA VITRECTOMY Left 06/20/2017   Procedure: PARS PLANA VITRECTOMY WITH 25 GAUGE, REPAIR OF COMPLEX TRACTION RETINAL DETACHMENT, MEMBRANE PEEEL;  Surgeon: [name], MD;  Location: [facility];  Service: Ophthalmology;  Laterality: Left;   POLYPECTOMY  03/06/2020   Procedure: POLYPECTOMY;  Surgeon: Harvel Quale,  MD;  [facility];  Service: Gastroenterology;;   REPAIR OF COMPLEX TRACTION RETINAL DETACHMENT Left 06/20/2017   RETINAL DETACHMENT SURGERY Right 04/2011   SCLERAL BUCKLE WITH POSSIBLE 25 GAUGE PARS PLANA VITRECTOMY Left 05/22/2017   Procedure: SCLERAL BUCKLE, LASER, GAS INJECTION LEFT EYE;  Surgeon: [name], MD;  Location: [facility];  Service: Ophthalmology;  Laterality: Left;   SKIN CANCER EXCISION Left 2013   left ear skin ca removed  2 weeks ago - not completeely healed 08/15/11    TEE WITHOUT  CARDIOVERSION N/A 07/09/2020   Procedure: TRANSESOPHAGEAL ECHOCARDIOGRAM (TEE);  Surgeon: Sherren Mocha, MD;  Location: [facility];  Service: Cardiovascular;  Laterality: N/A;   TEE WITHOUT CARDIOVERSION N/A 08/31/2020   Procedure: TRANSESOPHAGEAL ECHOCARDIOGRAM (TEE);  Surgeon: Geralynn Rile, MD;  Location: [facility];  Service: Cardiovascular;  Laterality: N/A;   TONSILLECTOMY     TOTAL KNEE ARTHROPLASTY  08/22/2011   Procedure: TOTAL KNEE ARTHROPLASTY;  Surgeon: Gearlean Alf, MD;  Location: [facility];  Service: Orthopedics;  Laterality: Right;    Home Medications:  Allergies as of 08/19/2022   No Known Allergies      Medication List        Accurate as of August 19, 2022 10:57 AM. If you have any questions, ask your nurse or doctor.          ALPRAZolam 0.5 MG tablet Commonly known as: XANAX Take 0.5 mg at bedtime by mouth. Anxiety   aspirin EC 81 MG tablet Take 1 tablet (81 mg total) by mouth daily. Swallow whole.   brimonidine 0.2 % ophthalmic solution Commonly known as: ALPHAGAN SMARTSIG:In Eye(s)   carboxymethylcellulose 0.5 % Soln Commonly known as: REFRESH PLUS Place 1 drop into the right eye daily as needed (dry eyes).   docusate sodium 100 MG capsule Commonly known as: COLACE Take 100 mg by mouth 2 (two) times daily.   FeroSul 325 (65 FE) MG tablet Generic drug: ferrous sulfate TAKE 1 TABLET ONCE DAILY.   FIBER COMPLETE PO Take by mouth in the morning and at bedtime.   FIBER-CAPS PO Take 1 capsule by mouth daily.   finasteride 5 MG tablet Commonly known as: PROSCAR TAKE 1 TABLET ONCE A DAY WITH SUPPER.   furosemide 40 MG tablet Commonly known as: LASIX TAKE 1 TABLET ONCE DAILY.   metoprolol tartrate 25 MG tablet Commonly known as: LOPRESSOR Take 0.5 tablets (12.5 mg total) by mouth 2 (two) times daily.   mupirocin ointment 2 % Commonly known as: BACTROBAN Apply 1 application. topically 3 (three) times daily.   NON  FORMULARY at bedtime. VPAP   potassium chloride 10 MEQ tablet Commonly known as: KLOR-CON M Take 10 mEq by mouth daily.   potassium chloride 10 MEQ tablet Commonly known as: KLOR-CON Take 10 mEq by mouth daily.   simvastatin 40 MG tablet Commonly known as: ZOCOR Take 40 mg by mouth at bedtime.   tamsulosin 0.4 MG Caps capsule Commonly known as: FLOMAX TAKE 1 CAPSULE BY MOUTH IN THE MORNING AND AT BEDTIME   tamsulosin 0.4 MG Caps capsule Commonly known as: FLOMAX TAKE 1 CAPSULE BY MOUTH IN THE MORNING AND AT BEDTIME Refill pending follow up appointment.   tamsulosin 0.4 MG Caps capsule Commonly known as: FLOMAX TAKE 1 CAPSULE BY MOUTH IN THE MORNING AND AT BEDTIME   tamsulosin 0.4 MG Caps capsule Commonly known as: FLOMAX TAKE 1 CAPSULE BY MOUTH IN THE MORNING AND AT BEDTIME   tamsulosin  0.4 MG Caps capsule Commonly known as: FLOMAX TAKE 1 CAPSULE BY MOUTH IN THE MORNING AND AT BEDTIME.   tamsulosin 0.4 MG Caps capsule Commonly known as: FLOMAX TAKE 1 CAPSULE BY MOUTH IN THE MORNING AND AT BEDTIME.   tamsulosin 0.4 MG Caps capsule Commonly known as: FLOMAX TAKE 1 CAPSULE BY MOUTH IN THE MORNING AND AT BEDTIME.   tamsulosin 0.4 MG Caps capsule Commonly known as: FLOMAX TAKE 1 CAPSULE BY MOUTH IN THE MORNING AND AT BEDTIME.   tamsulosin 0.4 MG Caps capsule Commonly known as: FLOMAX TAKE 1 CAPSULE BY MOUTH IN THE MORNING AND AT BEDTIME.   tamsulosin 0.4 MG Caps capsule Commonly known as: FLOMAX TAKE 1 CAPSULE BY MOUTH IN THE MORNING AND AT BEDTIME.   TART CHERRY ADVANCED PO Take 1 tablet by mouth daily.   vitamin C 1000 MG tablet Take 1,000 mg by mouth daily.   Vitamin D3 50 MCG (2000 UT) Tabs Generic drug: Cholecalciferol Take 5,000 Units by mouth 2 (two) times daily.   Zinc 50 MG Tabs Take 50 mg by mouth daily.        Allergies: No Known Allergies  Family History: Family History  Problem Relation Age of Onset   Heart failure Mother         also arthritis   Stroke Father        also emphysema   Heart failure Father     Social History:  reports that he has never smoked. He has never used smokeless tobacco. He reports that he does not drink alcohol and does not use drugs.  ROS: All other review of systems were reviewed and are negative except what is noted above in HPI  Physical Exam: BP 113/71   Pulse 85   Constitutional:  Alert and oriented, No acute distress. HEENT: [place] AT, moist mucus membranes.  Trachea midline, no masses. Cardiovascular: No clubbing, cyanosis, or edema. Respiratory: Normal respiratory effort, no increased work of breathing. GI: Abdomen is soft, nontender, nondistended, no abdominal masses GU: No CVA tenderness.  Lymph: No cervical or inguinal lymphadenopathy. Skin: No rashes, bruises or suspicious lesions. Neurologic: Grossly intact, no focal deficits, moving all 4 extremities. Psychiatric: Normal mood and affect.  Laboratory Data: Lab Results  Component Value Date   WBC 4.4 05/31/2022   HGB 13.3 05/31/2022   HCT 41.2 05/31/2022   MCV 91.6 05/31/2022   PLT 99 (L) 05/31/2022    Lab Results  Component Value Date   CREATININE 0.86 05/31/2022    No results found for: "PSA"  No results found for: "TESTOSTERONE"  No results found for: "HGBA1C"  Urinalysis    Component Value Date/Time   COLORURINE YELLOW 06/26/2018 1045   APPEARANCEUR Clear 08/17/2021 1206   LABSPEC 1.021 06/26/2018 1045   PHURINE 6.0 06/26/2018 1045   GLUCOSEU Negative 08/17/2021 1206   HGBUR SMALL (A) 06/26/2018 1045   BILIRUBINUR Negative 08/17/2021 1206   KETONESUR NEGATIVE 06/26/2018 1045   PROTEINUR Trace (A) 08/17/2021 1206   PROTEINUR NEGATIVE 06/26/2018 1045   UROBILINOGEN 0.2 08/14/2019 1151   UROBILINOGEN 0.2 08/15/2011 1248   NITRITE Negative 08/17/2021 1206   NITRITE POSITIVE (A) 06/26/2018 1045   LEUKOCYTESUR Negative 08/17/2021 1206    Lab Results  Component Value Date   LABMICR Comment  08/17/2021   BACTERIA MANY (A) 06/26/2018    Pertinent Imaging:  No results found for this or any previous visit.  No results found for this or any previous visit.  No results found  for this or any previous visit.  No results found for this or any previous visit.  No results found for this or any previous visit.  No valid procedures specified. No results found for this or any previous visit.  No results found for this or any previous visit.   Assessment & Plan:    1. Benign localized prostatic hyperplasia with lower urinary tract symptoms (LUTS) -continue flomax BID and finasteride 5mg  daily - Urinalysis, Routine w reflex microscopic  2. Nocturia -continue flomax 0.4mg  BID and finasteride 5mg  daily  3. Erectile dysfunction -we will trial tadalafil 20mg  prn   No follow-ups on file.  Nicolette Bang, MD  [facility] Urology [place]

## 2022-09-06 DIAGNOSIS — I739 Peripheral vascular disease, unspecified: Secondary | ICD-10-CM | POA: Diagnosis not present

## 2022-09-06 DIAGNOSIS — B351 Tinea unguium: Secondary | ICD-10-CM | POA: Diagnosis not present

## 2022-09-06 DIAGNOSIS — L110 Acquired keratosis follicularis: Secondary | ICD-10-CM | POA: Diagnosis not present

## 2022-09-14 DIAGNOSIS — L570 Actinic keratosis: Secondary | ICD-10-CM | POA: Diagnosis not present

## 2022-09-14 DIAGNOSIS — Z85828 Personal history of other malignant neoplasm of skin: Secondary | ICD-10-CM | POA: Diagnosis not present

## 2022-09-26 ENCOUNTER — Other Ambulatory Visit: Payer: Self-pay | Admitting: Internal Medicine

## 2022-10-25 ENCOUNTER — Other Ambulatory Visit: Payer: Self-pay | Admitting: Internal Medicine

## 2022-10-26 ENCOUNTER — Ambulatory Visit (HOSPITAL_COMMUNITY)
Admission: RE | Admit: 2022-10-26 | Discharge: 2022-10-26 | Disposition: A | Discharge status: Home / Self Care | Payer: Medicare Other | Source: Ambulatory Visit | Attending: Family Medicine | Admitting: Family Medicine

## 2022-10-26 ENCOUNTER — Other Ambulatory Visit (HOSPITAL_COMMUNITY): Payer: Self-pay | Admitting: Family Medicine

## 2022-10-26 DIAGNOSIS — S80911A Unspecified superficial injury of right knee, initial encounter: Secondary | ICD-10-CM

## 2022-10-26 DIAGNOSIS — M25561 Pain in right knee: Secondary | ICD-10-CM | POA: Diagnosis not present

## 2022-10-26 DIAGNOSIS — Z6836 Body mass index (BMI) 36.0-36.9, adult: Secondary | ICD-10-CM | POA: Diagnosis not present

## 2022-10-26 DIAGNOSIS — E6609 Other obesity due to excess calories: Secondary | ICD-10-CM | POA: Diagnosis not present

## 2022-10-27 ENCOUNTER — Encounter: Payer: Self-pay | Admitting: Internal Medicine

## 2022-10-27 ENCOUNTER — Ambulatory Visit: Payer: Medicare Other | Attending: Internal Medicine | Admitting: Internal Medicine

## 2022-10-27 VITALS — BP 110/60 | HR 59 | Ht 68.0 in | Wt 240.0 lb

## 2022-10-27 DIAGNOSIS — E668 Other obesity: Secondary | ICD-10-CM | POA: Diagnosis not present

## 2022-10-27 DIAGNOSIS — I5032 Chronic diastolic (congestive) heart failure: Secondary | ICD-10-CM | POA: Diagnosis not present

## 2022-10-27 DIAGNOSIS — I4821 Permanent atrial fibrillation: Secondary | ICD-10-CM

## 2022-10-27 NOTE — Progress Notes (Signed)
OFFICE NOTE  Chief Complaint:  Follow-up   Primary Care Physician: Assunta Found, MD  HPI:  Paul Lam is a 82 year old gentleman with a history of coronary disease with an EF of about 50-55%, also hypertension, dyslipidemia and sleep apnea on CPAP. Recently his EF had been reported to be lower, however, it is actually fairly normal. Overall he has done very well. No chest pain, shortness of breath, palpitations, presyncope or syncopal symptoms. He did have other non-cardiac problems over the past year, including a detached retina, skin cancer with grafting to the left ear, as well as a total knee replacement. He tolerated these surgeries well without any evidence of MI. His only concern today is some positional dizziness. He says that he gets dizzy when he bends over too quickly and sits up. He also gets dizzy sometimes when turning his head to the side. It was thought this might be due to positional orthostasis and his Lasix was held recently but there has been no significant change in his symptoms. During his episodes he also feels like there is some spinning of the room which sounds like vertigo to me.  Paul Lam returns today he is doing fairly well. He has felt somewhat fatigued recently however and it is noted that he is bradycardic today. He still get some positional dizziness and had one episode of vertigo. He was seen at [facility] and they thought that there was possibly some insufficiency to the brain may be vertebrobasilar insufficiency that was leading to his vestibular problems.  Some Paul Lam back in the office today. Overall he is doing fairly well. He continues to struggle with vertigo and positional dizziness, mainly when bending over. He has chronic lower extremity edema which is likely due to venous hypertension. I've offered venous Dopplers before but he seems to be asymptomatic with it and he is declined. He's followed for sleep apnea by Dr. Mayford Knife and saw her  recently and seems to be fairly compliant with this regimen. He takes furosemide several times a week for swelling.  01/04/2016  Paul Lam returns today for follow-up. He denies any chest pain or worsening shortness of breath. Unfortunately weight is up somewhat. He is not exercising regularly. Blood pressure is well-controlled at 120/56. He had recent follow-up with his primary care provider reported his cholesterol is at goal as well. He is going to see his urologist in a few weeks. He is compliant with CPAP and generally feels that he sleeps well at night.  01/23/2017  Paul Lam was seen today in follow-up. Today he is very sad and is accompanied by his wife. Their grandson recently committed suicide. I was to commend him on his weight loss, but I suspect the weight loss is been due to poor appetite since his grieving. He's also had some depression is now on medications. Overall he denies any chest pain or worsening shortness of breath. His blood pressure actually looks much better today, probably attributed to the weight loss. EKG is sinus rhythm.  03/09/2018  Paul Lam is seen today in annual follow-up.  He seemed to be doing well although did break down when he recalled their grandson who committed suicide last year and told me that his granddaughter also had attempted suicide by overdose.  Recently he had new onset atrial fibrillation and underwent cardioversion.  He followed up with Rudi Coco in the A. fib clinic and is been on Eliquis.  He seems to be maintaining sinus rhythm.  Otherwise Paul Lam has had no new issues.  Weight still is an issue.  Blood pressure was little elevated today however did come down.  Labs from July showed total cholesterol 113, HDL 44, LDL 61 and triglycerides 41.  03/21/2019  Paul Lam returns today for follow-up.  Recently was hospitalized and had urinary tract infection.  This was complicated by recurrent atrial fibrillation.  He had had a cardioversion last  year and maintained sinus.  According to him he thought he felt somewhat better after getting back to sinus rhythm however after this episode where he recently went into A. fib, he said he could not tell the difference.  He was therefore rate controlled and placed on anticoagulation.  He did follow-up with Rudi Coco, NP in the A. fib clinic.  At the time she felt this could be possibly ERAF, but was unclear whether he needed antiarrhythmic therapy.  Today he returns and he remains in atrial fibrillation.  He says he monitors it on his apple watch.  In general other than being alerted by his watch he has no symptoms related to it.  His only other complaint is he gets some occasional lower extremity swelling but thinks may be related to salt indiscretion.  03/23/2020  Paul Lam returns for annual follow-up this is also a hospitalization follow-up.  He was discharged from [facility] on March 07, 2020 after several days of evaluation for symptomatic anemia and possible diastolic heart failure.  Repeat echo was performed which showed normal EF of 60 to 65% and diastolic function could not be evaluated.  His right atrial pressure was elevated.  Reportedly he had acute on chronic lower extremity edema and his diuretics were increased.  He is currently on Lasix and denies any shortness of breath, orthopnea, or other heart failure symptoms.  He has chronic stocking wear.  The etiology of his anemia has not been found.  He does stay on Eliquis for atrial fibrillation.  He reported the Eliquis was not discontinued.  He had endoscopies and has a follow-up next month with GI.  EKG today shows A. fib with slow ventricular response.  07/28/2021  Paul Lam returns today for follow-up of his A-fib.  He recently underwent watchman placement which was successful.  Since then he is weaned off of anticoagulation and remains on low-dose aspirin.  He is in permanent A-fib with slow ventricular response today at 56.   Blood pressure is well controlled.  Denies any worsening shortness of breath or chest pain.  10/27/2022  Paul Lam is seen today for follow-up.  He said recently had a fall and he may have lost his balance.  Is not clear if he had a syncopal episode.  He saw his PCP.  Subsequently underwent some x-rays.  He has had some issues with balance.  He continues to have lower extremity swelling.  He is on diuretics and stockings which he says is helpful.  He is in permanent A-fib.  He had a Watchman procedure about 2 years ago which was successful and now is maintained only on low-dose aspirin.  He denies any chest pain or shortness of breath.  We did perform orthostatic vitals today because of his dizziness and those were negative.  PMHx:  Past Medical History:  Diagnosis Date   Anginal pain (HCC)    Anxiety    Arthritis    generalized arthritis    Atrial fibrillation (HCC)    CAD (coronary artery disease)    Cancer (  HCC)    skin cancer removed left ear     Complication of anesthesia    Depression    Headache    History of echocardiogram 02/19/2009   EF 50-55%; LA mild-mod dilated;    History of kidney stones    History of nuclear stress test 02/19/2009   low risk, normal    Hyperlipidemia    Hypertension    Obesity (BMI 30-39.9)    OSA (obstructive sleep apnea)    severe with AHI 25/hr now on BiPAP   PONV (postoperative nausea and vomiting)    Retinal detachment    with proliferative vitreoretinopathy   Shortness of breath    related to weight    Sleep apnea    Vertigo     Past Surgical History:  Procedure Laterality Date   APPENDECTOMY     BIOPSY  03/05/2020   Procedure: BIOPSY;  Surgeon: Malissa Hippo, MD;  [facility];  Service: Endoscopy;;   CARDIAC CATHETERIZATION  07/13/2005   noncritical CAD   CARDIOVERSION N/A 01/29/2018   Procedure: CARDIOVERSION;  Surgeon: Lars Masson, MD;  Location: [facility] ENDOSCOPY;  Service: Cardiovascular;  Laterality: N/A;    COLONOSCOPY WITH PROPOFOL N/A 03/06/2020   Procedure: COLONOSCOPY WITH PROPOFOL;  Surgeon: Dolores Frame, MD;  [facility]: [facility];  Service: Gastroenterology;  Laterality: N/A;   ESOPHAGOGASTRODUODENOSCOPY (EGD) WITH PROPOFOL N/A 03/05/2020   Procedure: ESOPHAGOGASTRODUODENOSCOPY (EGD) WITH PROPOFOL;  Surgeon: Malissa Hippo, MD;  [facility];  Service: Endoscopy;  Laterality: N/A;   EYE SURGERY     FLEXIBLE SIGMOIDOSCOPY N/A 07/14/2015   Procedure: FLEXIBLE SIGMOIDOSCOPY;  Surgeon: Franky Macho, MD;  [facility];  Service: Gastroenterology;  Laterality: N/A;   GIVENS CAPSULE STUDY N/A 04/15/2020   Procedure: GIVENS CAPSULE STUDY;  Surgeon: Dolores Frame, MD;  [facility];  Service: Gastroenterology;  Laterality: N/A;  730   JOINT REPLACEMENT     LEFT ATRIAL APPENDAGE OCCLUSION N/A 07/09/2020   Procedure: LEFT ATRIAL APPENDAGE OCCLUSION;  Surgeon: Tonny Bollman, MD;  Location: [facility] INVASIVE CV LAB;  Service: Cardiovascular;  Laterality: N/A;   NASAL SEPTOPLASTY W/ TURBINOPLASTY Bilateral 12/19/2017   Procedure: NASAL SEPTOPLASTY WITH TURBINATE REDUCTION;  Surgeon: Newman Pies, MD;  Location: [facility];  Service: ENT;  Laterality: Bilateral;   NASAL SINUS SURGERY     PARS PLANA VITRECTOMY Left 06/20/2017   Procedure: PARS PLANA VITRECTOMY WITH 25 GAUGE, REPAIR OF COMPLEX TRACTION RETINAL DETACHMENT, MEMBRANE PEEEL;  Surgeon: Sherrie George, MD;  Location: [facility] OR;  Service: Ophthalmology;  Laterality: Left;   POLYPECTOMY  03/06/2020   Procedure: POLYPECTOMY;  Surgeon: Dolores Frame, MD;  [facility];  Service: Gastroenterology;;   REPAIR OF COMPLEX TRACTION RETINAL DETACHMENT Left 06/20/2017   RETINAL DETACHMENT SURGERY Right 04/2011   SCLERAL BUCKLE WITH POSSIBLE 25 GAUGE PARS PLANA VITRECTOMY Left 05/22/2017   Procedure: SCLERAL BUCKLE, LASER, GAS INJECTION LEFT EYE;  Surgeon: Sherrie George, MD;   Location: [facility] OR;  Service: Ophthalmology;  Laterality: Left;   SKIN CANCER EXCISION Left 2013   left ear skin ca removed  2 weeks ago - not completeely healed 08/15/11    TEE WITHOUT CARDIOVERSION N/A 07/09/2020   Procedure: TRANSESOPHAGEAL ECHOCARDIOGRAM (TEE);  Surgeon: Tonny Bollman, MD;  Location: [facility] INVASIVE CV LAB;  Service: Cardiovascular;  Laterality: N/A;   TEE WITHOUT CARDIOVERSION N/A 08/31/2020   Procedure: TRANSESOPHAGEAL ECHOCARDIOGRAM (TEE);  Surgeon: Sande Rives,  MD;  Location: [facility] ENDOSCOPY;  Service: Cardiovascular;  Laterality: N/A;   TONSILLECTOMY     TOTAL KNEE ARTHROPLASTY  08/22/2011   Procedure: TOTAL KNEE ARTHROPLASTY;  Surgeon: Loanne Drilling, MD;  Location: [facility] ORS;  Service: Orthopedics;  Laterality: Right;    FAMHx:  Family History  Problem Relation Age of Onset   Heart failure Mother        also arthritis   Stroke Father        also emphysema   Heart failure Father     SOCHx:   reports that he has never smoked. He has never used smokeless tobacco. He reports that he does not drink alcohol and does not use drugs.  ALLERGIES:  No Known Allergies  ROS: Pertinent items noted in HPI and remainder of comprehensive ROS otherwise negative.  HOME MEDS: Current Outpatient Medications  Medication Sig Dispense Refill   ALPRAZolam (XANAX) 0.5 MG tablet Take 0.5 mg at bedtime by mouth. Anxiety      Ascorbic Acid (VITAMIN C) 1000 MG tablet Take 1,000 mg by mouth daily.     aspirin EC 81 MG tablet Take 1 tablet (81 mg total) by mouth daily. Swallow whole. 90 tablet 3   brimonidine (ALPHAGAN) 0.2 % ophthalmic solution SMARTSIG:In Eye(s)     Calcium Polycarbophil (FIBER-CAPS PO) Take 1 capsule by mouth daily.     carboxymethylcellulose (REFRESH PLUS) 0.5 % SOLN Place 1 drop into the right eye daily as needed (dry eyes).     celecoxib (CELEBREX) 200 MG capsule Take 200 mg by mouth 2 (two) times daily.     Cholecalciferol (VITAMIN D3) 50 MCG (2000 UT) TABS  Take 5,000 Units by mouth 2 (two) times daily.     docusate sodium (COLACE) 100 MG capsule Take 100 mg by mouth 2 (two) times daily.     FIBER COMPLETE PO Take by mouth in the morning and at bedtime.     finasteride (PROSCAR) 5 MG tablet Take 1 tablet (5 mg total) by mouth daily. 90 tablet 3   furosemide (LASIX) 40 MG tablet TAKE 1 TABLET ONCE DAILY. 30 tablet 0   metoprolol tartrate (LOPRESSOR) 25 MG tablet Take 0.5 tablets (12.5 mg total) by mouth 2 (two) times daily. 60 tablet 1   Misc Natural Products (TART CHERRY ADVANCED PO) Take 1 tablet by mouth daily.      NON FORMULARY at bedtime. VPAP     potassium chloride (KLOR-CON) 10 MEQ tablet Take 10 mEq by mouth daily.     simvastatin (ZOCOR) 40 MG tablet Take 40 mg by mouth at bedtime.     tamsulosin (FLOMAX) 0.4 MG CAPS capsule Take 1 capsule (0.4 mg total) by mouth in the morning and at bedtime. 180 capsule 3   Zinc 50 MG TABS Take 50 mg by mouth daily.     mupirocin ointment (BACTROBAN) 2 % Apply 1 application. topically 3 (three) times daily. (Patient not taking: Reported on 10/27/2022)     tadalafil (CIALIS) 20 MG tablet Take 1 tablet (20 mg total) by mouth daily as needed. (Patient not taking: Reported on 10/27/2022) 10 tablet 5   No current facility-administered medications for this visit.    LABS/IMAGING: No results found for this or any previous visit (from the past 48 hour(s)). DG Knee Complete 4 Views Right  Result Date: 10/26/2022 CLINICAL DATA:  Pain. EXAM: RIGHT KNEE - COMPLETE 4 VIEW COMPARISON:  None Available. FINDINGS: Status post total knee arthroplasty. No periprosthetic lucency to suggest  loosening. Heterotopic ossification noted in suprapatellar soft tissues. No acute fracture, dislocation or subluxation. IMPRESSION: Status post total knee arthroplasty. No acute osseous abnormalities identified. Electronically Signed   By: Layla Maw M.D.   On: 10/26/2022 20:52    VITALS: BP 110/60 (BP Location: Left Arm, Patient  Position: Sitting, Cuff Size: Normal)   Pulse (!) 59   Ht 5\' 8"  (1.727 m)   Wt 240 lb (108.9 kg)   SpO2 96%   BMI 36.49 kg/m   EXAM: General appearance: alert and no distress Neck: no carotid bruit, no JVD and thyroid not enlarged, symmetric, no tenderness/mass/nodules Lungs: clear to auscultation bilaterally Heart: regular rate and rhythm, S1, S2 normal, no murmur, click, rub or gallop Abdomen: soft, non-tender; bowel sounds normal; no masses,  no organomegaly Extremities: extremities normal, atraumatic, no cyanosis or edema Pulses: 2+ and symmetric Skin: Skin color, texture, turgor normal. No rashes or lesions Neurologic: GroPsych: Pleasanty normal Psych: Pleasant  EKG: A-fib with slow ventricular response at 59, PVCs-personally reviewed  ASSESSMENT: Permanent atrial fibrillation-CHADSVASC score of 4 -is post watchman left atrial appendage occluder device (2022) Hypertension  Dyslipidemia Obesity Dizziness Anxiety OSA on CPAP Chronic lower extremity edema secondary to venous hypertension Symptomatic anemia  PLAN: 1.   Paul Lam continues to do well with regards to his A-fib.  Is asymptomatic and in permanent A-fib with rate control.  He had an Watchman occluder device which went well.  Blood pressure is well-controlled.  Lipids are followed by his PCP.  He has had some falls including recently but this was not syncope rather it seems it was related to dizziness.  No changes to his meds today.  Follow-up with me annually or sooner if necessary.  Chrystie Nose, MD, [facility], FACP  [place]  [facility]  Medical Director of the Advanced Lipid Disorders &  Cardiovascular Risk Reduction Clinic Diplomate of the American Board of Clinical Lipidology Attending Cardiologist  Direct Dial: [phone]  Fax: [phone]  Website:  [email or web address]  Chrystie Nose 10/27/2022, 3:35 PM

## 2022-10-27 NOTE — Patient Instructions (Signed)
Medication Instructions:  Your physician recommends that you continue on your current medications as directed. Please refer to the Current Medication list given to you today.  *If you need a refill on your cardiac medications before your next appointment, please call your pharmacy*   Lab Work: None If you have labs (blood work) drawn today and your tests are completely normal, you will receive your results only by: MyChart Message (if you have MyChart) OR A paper copy in the mail If you have any lab test that is abnormal or we need to change your treatment, we will call you to review the results.   Testing/Procedures: None   Follow-Up: At [facility], you and your health needs are our priority.  As part of our continuing mission to provide you with exceptional heart care, we have created designated Provider Care Teams.  These Care Teams include your primary Cardiologist (physician) and Advanced Practice Providers (APPs -  Physician Assistants and Nurse Practitioners) who all work together to provide you with the care you need, when you need it.  We recommend signing up for the patient portal called "MyChart".  Sign up information is provided on this After Visit Summary.  MyChart is used to connect with patients for Virtual Visits (Telemedicine).  Patients are able to view lab/test results, encounter notes, upcoming appointments, etc.  Non-urgent messages can be sent to your provider as well.   To learn more about what you can do with MyChart, go to [email or web address].    Your next appointment:   1 year(s)  Provider:   Chrystie Nose, MD     Other Instructions

## 2022-11-22 DIAGNOSIS — B351 Tinea unguium: Secondary | ICD-10-CM | POA: Diagnosis not present

## 2022-11-22 DIAGNOSIS — I739 Peripheral vascular disease, unspecified: Secondary | ICD-10-CM | POA: Diagnosis not present

## 2022-11-22 DIAGNOSIS — L110 Acquired keratosis follicularis: Secondary | ICD-10-CM | POA: Diagnosis not present

## 2022-11-24 ENCOUNTER — Other Ambulatory Visit: Payer: Self-pay | Admitting: Internal Medicine

## 2022-11-24 ENCOUNTER — Encounter (INDEPENDENT_AMBULATORY_CARE_PROVIDER_SITE_OTHER): Payer: Medicare Other | Admitting: Ophthalmology

## 2022-11-24 DIAGNOSIS — H35033 Hypertensive retinopathy, bilateral: Secondary | ICD-10-CM

## 2022-11-24 DIAGNOSIS — H43811 Vitreous degeneration, right eye: Secondary | ICD-10-CM

## 2022-11-24 DIAGNOSIS — I10 Essential (primary) hypertension: Secondary | ICD-10-CM

## 2022-11-24 DIAGNOSIS — H35373 Puckering of macula, bilateral: Secondary | ICD-10-CM

## 2022-11-24 DIAGNOSIS — H401132 Primary open-angle glaucoma, bilateral, moderate stage: Secondary | ICD-10-CM | POA: Diagnosis not present

## 2022-11-24 DIAGNOSIS — H338 Other retinal detachments: Secondary | ICD-10-CM

## 2022-11-29 DIAGNOSIS — M79672 Pain in left foot: Secondary | ICD-10-CM | POA: Diagnosis not present

## 2022-11-29 DIAGNOSIS — M25579 Pain in unspecified ankle and joints of unspecified foot: Secondary | ICD-10-CM | POA: Diagnosis not present

## 2022-11-29 DIAGNOSIS — M2042 Other hammer toe(s) (acquired), left foot: Secondary | ICD-10-CM | POA: Diagnosis not present

## 2022-11-29 DIAGNOSIS — M79675 Pain in left toe(s): Secondary | ICD-10-CM | POA: Diagnosis not present

## 2022-12-05 DIAGNOSIS — M5441 Lumbago with sciatica, right side: Secondary | ICD-10-CM | POA: Diagnosis not present

## 2022-12-05 DIAGNOSIS — M47816 Spondylosis without myelopathy or radiculopathy, lumbar region: Secondary | ICD-10-CM | POA: Diagnosis not present

## 2022-12-05 DIAGNOSIS — M9903 Segmental and somatic dysfunction of lumbar region: Secondary | ICD-10-CM | POA: Diagnosis not present

## 2022-12-05 DIAGNOSIS — M9902 Segmental and somatic dysfunction of thoracic region: Secondary | ICD-10-CM | POA: Diagnosis not present

## 2022-12-07 DIAGNOSIS — M9903 Segmental and somatic dysfunction of lumbar region: Secondary | ICD-10-CM | POA: Diagnosis not present

## 2022-12-07 DIAGNOSIS — M9902 Segmental and somatic dysfunction of thoracic region: Secondary | ICD-10-CM | POA: Diagnosis not present

## 2022-12-07 DIAGNOSIS — M5441 Lumbago with sciatica, right side: Secondary | ICD-10-CM | POA: Diagnosis not present

## 2022-12-07 DIAGNOSIS — M47816 Spondylosis without myelopathy or radiculopathy, lumbar region: Secondary | ICD-10-CM | POA: Diagnosis not present

## 2022-12-16 DIAGNOSIS — M9902 Segmental and somatic dysfunction of thoracic region: Secondary | ICD-10-CM | POA: Diagnosis not present

## 2022-12-16 DIAGNOSIS — M47816 Spondylosis without myelopathy or radiculopathy, lumbar region: Secondary | ICD-10-CM | POA: Diagnosis not present

## 2022-12-16 DIAGNOSIS — M9903 Segmental and somatic dysfunction of lumbar region: Secondary | ICD-10-CM | POA: Diagnosis not present

## 2022-12-16 DIAGNOSIS — M5441 Lumbago with sciatica, right side: Secondary | ICD-10-CM | POA: Diagnosis not present

## 2022-12-20 DIAGNOSIS — K080 Exfoliation of teeth due to systemic causes: Secondary | ICD-10-CM | POA: Diagnosis not present

## 2022-12-21 ENCOUNTER — Other Ambulatory Visit: Payer: Self-pay | Admitting: Internal Medicine

## 2022-12-23 DIAGNOSIS — M9902 Segmental and somatic dysfunction of thoracic region: Secondary | ICD-10-CM | POA: Diagnosis not present

## 2022-12-23 DIAGNOSIS — M47816 Spondylosis without myelopathy or radiculopathy, lumbar region: Secondary | ICD-10-CM | POA: Diagnosis not present

## 2022-12-23 DIAGNOSIS — M5441 Lumbago with sciatica, right side: Secondary | ICD-10-CM | POA: Diagnosis not present

## 2022-12-23 DIAGNOSIS — M9903 Segmental and somatic dysfunction of lumbar region: Secondary | ICD-10-CM | POA: Diagnosis not present

## 2022-12-29 DIAGNOSIS — M47816 Spondylosis without myelopathy or radiculopathy, lumbar region: Secondary | ICD-10-CM | POA: Diagnosis not present

## 2022-12-29 DIAGNOSIS — M9903 Segmental and somatic dysfunction of lumbar region: Secondary | ICD-10-CM | POA: Diagnosis not present

## 2022-12-29 DIAGNOSIS — M9902 Segmental and somatic dysfunction of thoracic region: Secondary | ICD-10-CM | POA: Diagnosis not present

## 2022-12-29 DIAGNOSIS — M5441 Lumbago with sciatica, right side: Secondary | ICD-10-CM | POA: Diagnosis not present

## 2023-01-04 ENCOUNTER — Telehealth: Payer: Self-pay | Admitting: Internal Medicine

## 2023-01-04 MED ORDER — FUROSEMIDE 40 MG PO TABS: 40.0000 mg | ORAL_TABLET | Freq: Every day | ORAL | 3 refills | Status: DC

## 2023-01-04 NOTE — Telephone Encounter (Signed)
*  STAT* If patient is at the pharmacy, call can be transferred to refill team.   1. Which medications need to be refilled? (please list name of each medication and dose if known)   furosemide (LASIX) 40 MG tablet    2. Which pharmacy/location (including street and city if local pharmacy) is medication to be sent to? [facility] - [place], [place] - [address]   3. Do they need a 30 day or 90 day supply? 90 Day Supply  Pt has an appt on 10/27/22.

## 2023-01-04 NOTE — Telephone Encounter (Signed)
Refill complete 

## 2023-01-06 DIAGNOSIS — M47816 Spondylosis without myelopathy or radiculopathy, lumbar region: Secondary | ICD-10-CM | POA: Diagnosis not present

## 2023-01-06 DIAGNOSIS — M5441 Lumbago with sciatica, right side: Secondary | ICD-10-CM | POA: Diagnosis not present

## 2023-01-06 DIAGNOSIS — M9902 Segmental and somatic dysfunction of thoracic region: Secondary | ICD-10-CM | POA: Diagnosis not present

## 2023-01-06 DIAGNOSIS — M9903 Segmental and somatic dysfunction of lumbar region: Secondary | ICD-10-CM | POA: Diagnosis not present

## 2023-01-16 DIAGNOSIS — E6609 Other obesity due to excess calories: Secondary | ICD-10-CM | POA: Diagnosis not present

## 2023-01-16 DIAGNOSIS — D696 Thrombocytopenia, unspecified: Secondary | ICD-10-CM | POA: Diagnosis not present

## 2023-01-16 DIAGNOSIS — Z0001 Encounter for general adult medical examination with abnormal findings: Secondary | ICD-10-CM | POA: Diagnosis not present

## 2023-01-16 DIAGNOSIS — I5030 Unspecified diastolic (congestive) heart failure: Secondary | ICD-10-CM | POA: Diagnosis not present

## 2023-01-16 DIAGNOSIS — Z6836 Body mass index (BMI) 36.0-36.9, adult: Secondary | ICD-10-CM | POA: Diagnosis not present

## 2023-01-16 DIAGNOSIS — Z1331 Encounter for screening for depression: Secondary | ICD-10-CM | POA: Diagnosis not present

## 2023-01-16 DIAGNOSIS — I4819 Other persistent atrial fibrillation: Secondary | ICD-10-CM | POA: Diagnosis not present

## 2023-01-16 DIAGNOSIS — I10 Essential (primary) hypertension: Secondary | ICD-10-CM | POA: Diagnosis not present

## 2023-01-16 DIAGNOSIS — G4730 Sleep apnea, unspecified: Secondary | ICD-10-CM | POA: Diagnosis not present

## 2023-01-16 DIAGNOSIS — I2510 Atherosclerotic heart disease of native coronary artery without angina pectoris: Secondary | ICD-10-CM | POA: Diagnosis not present

## 2023-01-16 DIAGNOSIS — F419 Anxiety disorder, unspecified: Secondary | ICD-10-CM | POA: Diagnosis not present

## 2023-01-16 DIAGNOSIS — E785 Hyperlipidemia, unspecified: Secondary | ICD-10-CM | POA: Diagnosis not present

## 2023-01-17 DIAGNOSIS — I2510 Atherosclerotic heart disease of native coronary artery without angina pectoris: Secondary | ICD-10-CM | POA: Diagnosis not present

## 2023-01-17 DIAGNOSIS — I4891 Unspecified atrial fibrillation: Secondary | ICD-10-CM | POA: Diagnosis not present

## 2023-01-17 DIAGNOSIS — N400 Enlarged prostate without lower urinary tract symptoms: Secondary | ICD-10-CM | POA: Diagnosis not present

## 2023-01-17 DIAGNOSIS — I5030 Unspecified diastolic (congestive) heart failure: Secondary | ICD-10-CM | POA: Diagnosis not present

## 2023-01-20 DIAGNOSIS — M5441 Lumbago with sciatica, right side: Secondary | ICD-10-CM | POA: Diagnosis not present

## 2023-01-20 DIAGNOSIS — M47816 Spondylosis without myelopathy or radiculopathy, lumbar region: Secondary | ICD-10-CM | POA: Diagnosis not present

## 2023-01-20 DIAGNOSIS — M9902 Segmental and somatic dysfunction of thoracic region: Secondary | ICD-10-CM | POA: Diagnosis not present

## 2023-01-20 DIAGNOSIS — M9903 Segmental and somatic dysfunction of lumbar region: Secondary | ICD-10-CM | POA: Diagnosis not present

## 2023-01-31 DIAGNOSIS — L110 Acquired keratosis follicularis: Secondary | ICD-10-CM | POA: Diagnosis not present

## 2023-01-31 DIAGNOSIS — K080 Exfoliation of teeth due to systemic causes: Secondary | ICD-10-CM | POA: Diagnosis not present

## 2023-01-31 DIAGNOSIS — B351 Tinea unguium: Secondary | ICD-10-CM | POA: Diagnosis not present

## 2023-01-31 DIAGNOSIS — I739 Peripheral vascular disease, unspecified: Secondary | ICD-10-CM | POA: Diagnosis not present

## 2023-02-02 DIAGNOSIS — K080 Exfoliation of teeth due to systemic causes: Secondary | ICD-10-CM | POA: Diagnosis not present

## 2023-02-04 DIAGNOSIS — D6869 Other thrombophilia: Secondary | ICD-10-CM | POA: Diagnosis not present

## 2023-02-04 DIAGNOSIS — I5030 Unspecified diastolic (congestive) heart failure: Secondary | ICD-10-CM | POA: Diagnosis not present

## 2023-02-04 DIAGNOSIS — I4819 Other persistent atrial fibrillation: Secondary | ICD-10-CM | POA: Diagnosis not present

## 2023-02-07 DIAGNOSIS — M5441 Lumbago with sciatica, right side: Secondary | ICD-10-CM | POA: Diagnosis not present

## 2023-02-07 DIAGNOSIS — M9902 Segmental and somatic dysfunction of thoracic region: Secondary | ICD-10-CM | POA: Diagnosis not present

## 2023-02-07 DIAGNOSIS — M9903 Segmental and somatic dysfunction of lumbar region: Secondary | ICD-10-CM | POA: Diagnosis not present

## 2023-02-07 DIAGNOSIS — M47816 Spondylosis without myelopathy or radiculopathy, lumbar region: Secondary | ICD-10-CM | POA: Diagnosis not present

## 2023-02-14 DIAGNOSIS — M9902 Segmental and somatic dysfunction of thoracic region: Secondary | ICD-10-CM | POA: Diagnosis not present

## 2023-02-14 DIAGNOSIS — M47816 Spondylosis without myelopathy or radiculopathy, lumbar region: Secondary | ICD-10-CM | POA: Diagnosis not present

## 2023-02-14 DIAGNOSIS — M5441 Lumbago with sciatica, right side: Secondary | ICD-10-CM | POA: Diagnosis not present

## 2023-02-14 DIAGNOSIS — M9903 Segmental and somatic dysfunction of lumbar region: Secondary | ICD-10-CM | POA: Diagnosis not present

## 2023-02-21 DIAGNOSIS — M9903 Segmental and somatic dysfunction of lumbar region: Secondary | ICD-10-CM | POA: Diagnosis not present

## 2023-02-21 DIAGNOSIS — M9902 Segmental and somatic dysfunction of thoracic region: Secondary | ICD-10-CM | POA: Diagnosis not present

## 2023-02-21 DIAGNOSIS — M5441 Lumbago with sciatica, right side: Secondary | ICD-10-CM | POA: Diagnosis not present

## 2023-02-21 DIAGNOSIS — M47816 Spondylosis without myelopathy or radiculopathy, lumbar region: Secondary | ICD-10-CM | POA: Diagnosis not present

## 2023-02-28 DIAGNOSIS — M5441 Lumbago with sciatica, right side: Secondary | ICD-10-CM | POA: Diagnosis not present

## 2023-02-28 DIAGNOSIS — M47816 Spondylosis without myelopathy or radiculopathy, lumbar region: Secondary | ICD-10-CM | POA: Diagnosis not present

## 2023-02-28 DIAGNOSIS — M9903 Segmental and somatic dysfunction of lumbar region: Secondary | ICD-10-CM | POA: Diagnosis not present

## 2023-02-28 DIAGNOSIS — M9902 Segmental and somatic dysfunction of thoracic region: Secondary | ICD-10-CM | POA: Diagnosis not present

## 2023-03-06 DIAGNOSIS — G4733 Obstructive sleep apnea (adult) (pediatric): Secondary | ICD-10-CM | POA: Diagnosis not present

## 2023-03-07 DIAGNOSIS — M5441 Lumbago with sciatica, right side: Secondary | ICD-10-CM | POA: Diagnosis not present

## 2023-03-07 DIAGNOSIS — M9902 Segmental and somatic dysfunction of thoracic region: Secondary | ICD-10-CM | POA: Diagnosis not present

## 2023-03-07 DIAGNOSIS — M9903 Segmental and somatic dysfunction of lumbar region: Secondary | ICD-10-CM | POA: Diagnosis not present

## 2023-03-07 DIAGNOSIS — M47816 Spondylosis without myelopathy or radiculopathy, lumbar region: Secondary | ICD-10-CM | POA: Diagnosis not present

## 2023-03-14 DIAGNOSIS — M5441 Lumbago with sciatica, right side: Secondary | ICD-10-CM | POA: Diagnosis not present

## 2023-03-14 DIAGNOSIS — M9903 Segmental and somatic dysfunction of lumbar region: Secondary | ICD-10-CM | POA: Diagnosis not present

## 2023-03-14 DIAGNOSIS — M9902 Segmental and somatic dysfunction of thoracic region: Secondary | ICD-10-CM | POA: Diagnosis not present

## 2023-03-14 DIAGNOSIS — M47816 Spondylosis without myelopathy or radiculopathy, lumbar region: Secondary | ICD-10-CM | POA: Diagnosis not present

## 2023-03-21 DIAGNOSIS — L570 Actinic keratosis: Secondary | ICD-10-CM | POA: Diagnosis not present

## 2023-03-21 DIAGNOSIS — L821 Other seborrheic keratosis: Secondary | ICD-10-CM | POA: Diagnosis not present

## 2023-03-23 DIAGNOSIS — M9902 Segmental and somatic dysfunction of thoracic region: Secondary | ICD-10-CM | POA: Diagnosis not present

## 2023-03-23 DIAGNOSIS — M9903 Segmental and somatic dysfunction of lumbar region: Secondary | ICD-10-CM | POA: Diagnosis not present

## 2023-03-23 DIAGNOSIS — M47816 Spondylosis without myelopathy or radiculopathy, lumbar region: Secondary | ICD-10-CM | POA: Diagnosis not present

## 2023-03-23 DIAGNOSIS — M5441 Lumbago with sciatica, right side: Secondary | ICD-10-CM | POA: Diagnosis not present

## 2023-03-30 DIAGNOSIS — M9903 Segmental and somatic dysfunction of lumbar region: Secondary | ICD-10-CM | POA: Diagnosis not present

## 2023-03-30 DIAGNOSIS — M5441 Lumbago with sciatica, right side: Secondary | ICD-10-CM | POA: Diagnosis not present

## 2023-03-30 DIAGNOSIS — M9902 Segmental and somatic dysfunction of thoracic region: Secondary | ICD-10-CM | POA: Diagnosis not present

## 2023-03-30 DIAGNOSIS — M47816 Spondylosis without myelopathy or radiculopathy, lumbar region: Secondary | ICD-10-CM | POA: Diagnosis not present

## 2023-04-05 DIAGNOSIS — B351 Tinea unguium: Secondary | ICD-10-CM | POA: Diagnosis not present

## 2023-04-05 DIAGNOSIS — L110 Acquired keratosis follicularis: Secondary | ICD-10-CM | POA: Diagnosis not present

## 2023-04-05 DIAGNOSIS — I739 Peripheral vascular disease, unspecified: Secondary | ICD-10-CM | POA: Diagnosis not present

## 2023-04-06 DIAGNOSIS — I4819 Other persistent atrial fibrillation: Secondary | ICD-10-CM | POA: Diagnosis not present

## 2023-04-06 DIAGNOSIS — I5030 Unspecified diastolic (congestive) heart failure: Secondary | ICD-10-CM | POA: Diagnosis not present

## 2023-04-06 DIAGNOSIS — E782 Mixed hyperlipidemia: Secondary | ICD-10-CM | POA: Diagnosis not present

## 2023-04-06 DIAGNOSIS — I110 Hypertensive heart disease with heart failure: Secondary | ICD-10-CM | POA: Diagnosis not present

## 2023-04-07 DIAGNOSIS — M47816 Spondylosis without myelopathy or radiculopathy, lumbar region: Secondary | ICD-10-CM | POA: Diagnosis not present

## 2023-04-07 DIAGNOSIS — M5441 Lumbago with sciatica, right side: Secondary | ICD-10-CM | POA: Diagnosis not present

## 2023-04-07 DIAGNOSIS — M9903 Segmental and somatic dysfunction of lumbar region: Secondary | ICD-10-CM | POA: Diagnosis not present

## 2023-04-07 DIAGNOSIS — M9902 Segmental and somatic dysfunction of thoracic region: Secondary | ICD-10-CM | POA: Diagnosis not present

## 2023-04-14 DIAGNOSIS — M47816 Spondylosis without myelopathy or radiculopathy, lumbar region: Secondary | ICD-10-CM | POA: Diagnosis not present

## 2023-04-14 DIAGNOSIS — M5441 Lumbago with sciatica, right side: Secondary | ICD-10-CM | POA: Diagnosis not present

## 2023-04-14 DIAGNOSIS — M9902 Segmental and somatic dysfunction of thoracic region: Secondary | ICD-10-CM | POA: Diagnosis not present

## 2023-04-14 DIAGNOSIS — M9903 Segmental and somatic dysfunction of lumbar region: Secondary | ICD-10-CM | POA: Diagnosis not present

## 2023-04-21 DIAGNOSIS — M9902 Segmental and somatic dysfunction of thoracic region: Secondary | ICD-10-CM | POA: Diagnosis not present

## 2023-04-21 DIAGNOSIS — M47816 Spondylosis without myelopathy or radiculopathy, lumbar region: Secondary | ICD-10-CM | POA: Diagnosis not present

## 2023-04-21 DIAGNOSIS — M9903 Segmental and somatic dysfunction of lumbar region: Secondary | ICD-10-CM | POA: Diagnosis not present

## 2023-04-21 DIAGNOSIS — M5441 Lumbago with sciatica, right side: Secondary | ICD-10-CM | POA: Diagnosis not present

## 2023-04-28 DIAGNOSIS — M9902 Segmental and somatic dysfunction of thoracic region: Secondary | ICD-10-CM | POA: Diagnosis not present

## 2023-04-28 DIAGNOSIS — M5441 Lumbago with sciatica, right side: Secondary | ICD-10-CM | POA: Diagnosis not present

## 2023-04-28 DIAGNOSIS — M47816 Spondylosis without myelopathy or radiculopathy, lumbar region: Secondary | ICD-10-CM | POA: Diagnosis not present

## 2023-04-28 DIAGNOSIS — M9903 Segmental and somatic dysfunction of lumbar region: Secondary | ICD-10-CM | POA: Diagnosis not present

## 2023-05-03 DIAGNOSIS — M47816 Spondylosis without myelopathy or radiculopathy, lumbar region: Secondary | ICD-10-CM | POA: Diagnosis not present

## 2023-05-03 DIAGNOSIS — M5441 Lumbago with sciatica, right side: Secondary | ICD-10-CM | POA: Diagnosis not present

## 2023-05-03 DIAGNOSIS — M9902 Segmental and somatic dysfunction of thoracic region: Secondary | ICD-10-CM | POA: Diagnosis not present

## 2023-05-03 DIAGNOSIS — M9903 Segmental and somatic dysfunction of lumbar region: Secondary | ICD-10-CM | POA: Diagnosis not present

## 2023-05-11 DIAGNOSIS — M9903 Segmental and somatic dysfunction of lumbar region: Secondary | ICD-10-CM | POA: Diagnosis not present

## 2023-05-11 DIAGNOSIS — M47816 Spondylosis without myelopathy or radiculopathy, lumbar region: Secondary | ICD-10-CM | POA: Diagnosis not present

## 2023-05-11 DIAGNOSIS — M5441 Lumbago with sciatica, right side: Secondary | ICD-10-CM | POA: Diagnosis not present

## 2023-05-11 DIAGNOSIS — M9902 Segmental and somatic dysfunction of thoracic region: Secondary | ICD-10-CM | POA: Diagnosis not present

## 2023-05-17 DIAGNOSIS — M9903 Segmental and somatic dysfunction of lumbar region: Secondary | ICD-10-CM | POA: Diagnosis not present

## 2023-05-17 DIAGNOSIS — M9902 Segmental and somatic dysfunction of thoracic region: Secondary | ICD-10-CM | POA: Diagnosis not present

## 2023-05-17 DIAGNOSIS — M5441 Lumbago with sciatica, right side: Secondary | ICD-10-CM | POA: Diagnosis not present

## 2023-05-17 DIAGNOSIS — M47816 Spondylosis without myelopathy or radiculopathy, lumbar region: Secondary | ICD-10-CM | POA: Diagnosis not present

## 2023-05-23 DIAGNOSIS — F419 Anxiety disorder, unspecified: Secondary | ICD-10-CM | POA: Diagnosis not present

## 2023-05-23 DIAGNOSIS — G4730 Sleep apnea, unspecified: Secondary | ICD-10-CM | POA: Diagnosis not present

## 2023-05-23 DIAGNOSIS — N1831 Chronic kidney disease, stage 3a: Secondary | ICD-10-CM | POA: Diagnosis not present

## 2023-05-23 DIAGNOSIS — I4819 Other persistent atrial fibrillation: Secondary | ICD-10-CM | POA: Diagnosis not present

## 2023-05-23 DIAGNOSIS — I5030 Unspecified diastolic (congestive) heart failure: Secondary | ICD-10-CM | POA: Diagnosis not present

## 2023-05-23 DIAGNOSIS — I110 Hypertensive heart disease with heart failure: Secondary | ICD-10-CM | POA: Diagnosis not present

## 2023-05-23 DIAGNOSIS — Z6837 Body mass index (BMI) 37.0-37.9, adult: Secondary | ICD-10-CM | POA: Diagnosis not present

## 2023-05-23 DIAGNOSIS — I10 Essential (primary) hypertension: Secondary | ICD-10-CM | POA: Diagnosis not present

## 2023-05-25 ENCOUNTER — Encounter (INDEPENDENT_AMBULATORY_CARE_PROVIDER_SITE_OTHER): Payer: Medicare Other | Admitting: Ophthalmology

## 2023-05-25 DIAGNOSIS — H338 Other retinal detachments: Secondary | ICD-10-CM | POA: Diagnosis not present

## 2023-05-25 DIAGNOSIS — H35033 Hypertensive retinopathy, bilateral: Secondary | ICD-10-CM | POA: Diagnosis not present

## 2023-05-25 DIAGNOSIS — H35373 Puckering of macula, bilateral: Secondary | ICD-10-CM | POA: Diagnosis not present

## 2023-05-25 DIAGNOSIS — I10 Essential (primary) hypertension: Secondary | ICD-10-CM | POA: Diagnosis not present

## 2023-05-25 DIAGNOSIS — H43811 Vitreous degeneration, right eye: Secondary | ICD-10-CM

## 2023-05-25 DIAGNOSIS — D3131 Benign neoplasm of right choroid: Secondary | ICD-10-CM

## 2023-06-06 DIAGNOSIS — N1831 Chronic kidney disease, stage 3a: Secondary | ICD-10-CM | POA: Diagnosis not present

## 2023-06-06 DIAGNOSIS — I4819 Other persistent atrial fibrillation: Secondary | ICD-10-CM | POA: Diagnosis not present

## 2023-06-06 DIAGNOSIS — I5030 Unspecified diastolic (congestive) heart failure: Secondary | ICD-10-CM | POA: Diagnosis not present

## 2023-06-14 DIAGNOSIS — M47816 Spondylosis without myelopathy or radiculopathy, lumbar region: Secondary | ICD-10-CM | POA: Diagnosis not present

## 2023-06-14 DIAGNOSIS — M9902 Segmental and somatic dysfunction of thoracic region: Secondary | ICD-10-CM | POA: Diagnosis not present

## 2023-06-14 DIAGNOSIS — M5441 Lumbago with sciatica, right side: Secondary | ICD-10-CM | POA: Diagnosis not present

## 2023-06-14 DIAGNOSIS — M9903 Segmental and somatic dysfunction of lumbar region: Secondary | ICD-10-CM | POA: Diagnosis not present

## 2023-06-15 DIAGNOSIS — I739 Peripheral vascular disease, unspecified: Secondary | ICD-10-CM | POA: Diagnosis not present

## 2023-06-15 DIAGNOSIS — L110 Acquired keratosis follicularis: Secondary | ICD-10-CM | POA: Diagnosis not present

## 2023-06-15 DIAGNOSIS — B351 Tinea unguium: Secondary | ICD-10-CM | POA: Diagnosis not present

## 2023-06-22 DIAGNOSIS — H5213 Myopia, bilateral: Secondary | ICD-10-CM | POA: Diagnosis not present

## 2023-06-26 DIAGNOSIS — M25561 Pain in right knee: Secondary | ICD-10-CM | POA: Diagnosis not present

## 2023-06-27 ENCOUNTER — Other Ambulatory Visit: Payer: Self-pay

## 2023-06-27 ENCOUNTER — Emergency Department (HOSPITAL_COMMUNITY): Payer: Medicare Other

## 2023-06-27 ENCOUNTER — Emergency Department (HOSPITAL_COMMUNITY)
Admission: EM | Admit: 2023-06-27 | Discharge: 2023-06-27 | Disposition: A | Discharge status: Home / Self Care | Payer: Medicare Other | Attending: Emergency Medicine | Admitting: Emergency Medicine

## 2023-06-27 ENCOUNTER — Encounter (HOSPITAL_COMMUNITY): Payer: Self-pay | Admitting: Emergency Medicine

## 2023-06-27 DIAGNOSIS — I509 Heart failure, unspecified: Secondary | ICD-10-CM | POA: Insufficient documentation

## 2023-06-27 DIAGNOSIS — Z7982 Long term (current) use of aspirin: Secondary | ICD-10-CM | POA: Diagnosis not present

## 2023-06-27 DIAGNOSIS — M79672 Pain in left foot: Secondary | ICD-10-CM | POA: Diagnosis not present

## 2023-06-27 DIAGNOSIS — S61411A Laceration without foreign body of right hand, initial encounter: Secondary | ICD-10-CM | POA: Diagnosis not present

## 2023-06-27 DIAGNOSIS — M8580 Other specified disorders of bone density and structure, unspecified site: Secondary | ICD-10-CM | POA: Diagnosis not present

## 2023-06-27 DIAGNOSIS — S0993XA Unspecified injury of face, initial encounter: Secondary | ICD-10-CM | POA: Diagnosis not present

## 2023-06-27 DIAGNOSIS — Y92481 Parking lot as the place of occurrence of the external cause: Secondary | ICD-10-CM | POA: Insufficient documentation

## 2023-06-27 DIAGNOSIS — I2510 Atherosclerotic heart disease of native coronary artery without angina pectoris: Secondary | ICD-10-CM | POA: Diagnosis not present

## 2023-06-27 DIAGNOSIS — Z23 Encounter for immunization: Secondary | ICD-10-CM | POA: Diagnosis not present

## 2023-06-27 DIAGNOSIS — M1612 Unilateral primary osteoarthritis, left hip: Secondary | ICD-10-CM | POA: Diagnosis not present

## 2023-06-27 DIAGNOSIS — S199XXA Unspecified injury of neck, initial encounter: Secondary | ICD-10-CM | POA: Diagnosis not present

## 2023-06-27 DIAGNOSIS — S0181XA Laceration without foreign body of other part of head, initial encounter: Secondary | ICD-10-CM | POA: Diagnosis not present

## 2023-06-27 DIAGNOSIS — I110 Hypertensive heart disease with heart failure: Secondary | ICD-10-CM | POA: Insufficient documentation

## 2023-06-27 DIAGNOSIS — Z96651 Presence of right artificial knee joint: Secondary | ICD-10-CM | POA: Diagnosis not present

## 2023-06-27 DIAGNOSIS — M85861 Other specified disorders of bone density and structure, right lower leg: Secondary | ICD-10-CM | POA: Diagnosis not present

## 2023-06-27 DIAGNOSIS — M25552 Pain in left hip: Secondary | ICD-10-CM | POA: Diagnosis not present

## 2023-06-27 DIAGNOSIS — S01111A Laceration without foreign body of right eyelid and periocular area, initial encounter: Secondary | ICD-10-CM | POA: Diagnosis not present

## 2023-06-27 DIAGNOSIS — M1811 Unilateral primary osteoarthritis of first carpometacarpal joint, right hand: Secondary | ICD-10-CM | POA: Diagnosis not present

## 2023-06-27 DIAGNOSIS — I4891 Unspecified atrial fibrillation: Secondary | ICD-10-CM | POA: Diagnosis not present

## 2023-06-27 DIAGNOSIS — M25561 Pain in right knee: Secondary | ICD-10-CM | POA: Diagnosis not present

## 2023-06-27 DIAGNOSIS — M25572 Pain in left ankle and joints of left foot: Secondary | ICD-10-CM | POA: Diagnosis not present

## 2023-06-27 DIAGNOSIS — S59909A Unspecified injury of unspecified elbow, initial encounter: Secondary | ICD-10-CM | POA: Diagnosis not present

## 2023-06-27 DIAGNOSIS — M19021 Primary osteoarthritis, right elbow: Secondary | ICD-10-CM | POA: Diagnosis not present

## 2023-06-27 DIAGNOSIS — W19XXXA Unspecified fall, initial encounter: Secondary | ICD-10-CM

## 2023-06-27 DIAGNOSIS — M19072 Primary osteoarthritis, left ankle and foot: Secondary | ICD-10-CM | POA: Diagnosis not present

## 2023-06-27 DIAGNOSIS — R609 Edema, unspecified: Secondary | ICD-10-CM | POA: Diagnosis not present

## 2023-06-27 DIAGNOSIS — I6782 Cerebral ischemia: Secondary | ICD-10-CM | POA: Diagnosis not present

## 2023-06-27 DIAGNOSIS — Z043 Encounter for examination and observation following other accident: Secondary | ICD-10-CM | POA: Diagnosis not present

## 2023-06-27 DIAGNOSIS — W010XXA Fall on same level from slipping, tripping and stumbling without subsequent striking against object, initial encounter: Secondary | ICD-10-CM | POA: Insufficient documentation

## 2023-06-27 MED ORDER — CEFADROXIL 500 MG PO CAPS: 500.0000 mg | ORAL_CAPSULE | Freq: Two times a day (BID) | ORAL | 0 refills | Status: DC

## 2023-06-27 MED ORDER — LIDOCAINE-EPINEPHRINE (PF) 2 %-1:200000 IJ SOLN
10.0000 mL | Freq: Once | INTRAMUSCULAR | Status: AC
  Administered 2023-06-27: 10 mL
  Filled 2023-06-27: qty 20

## 2023-06-27 MED ORDER — TETANUS-DIPHTH-ACELL PERTUSSIS 5-2.5-18.5 LF-MCG/0.5 IM SUSY
0.5000 mL | PREFILLED_SYRINGE | Freq: Once | INTRAMUSCULAR | Status: AC
Start: 2023-06-27 — End: 2023-06-27
  Administered 2023-06-27: 0.5 mL via INTRAMUSCULAR
  Filled 2023-06-27: qty 0.5

## 2023-06-27 MED ORDER — ACETAMINOPHEN 325 MG PO TABS
650.0000 mg | ORAL_TABLET | Freq: Once | ORAL | Status: AC
  Administered 2023-06-27: 650 mg via ORAL
  Filled 2023-06-27: qty 2

## 2023-06-27 NOTE — ED Triage Notes (Signed)
Pt brought in by EMS. Pt had a witnessed fall face forward at [employer].  + head strike, no blood thinners. Pt remembers event.  Patient has a laceration above right eyebrow and bruising around right eye.  Alert and Oriented x4.

## 2023-06-27 NOTE — ED Provider Notes (Incomplete)
[place] [facility]ERG[facility] DEPARTMENT AT [facility] Provider Note   CSN: [id number] Arrival date & time: 06/27/23  1413     History {Add pertinent medical, surgical, social history, OB history to HPI:1} Chief Complaint  Patient presents with   Paul Lam is a 83 y.o. male.   Fall   83 year old male presents emergency department for fall.  Patient states that he [place] walking [place] parking [place] and feel he tripped on the train covers in the parking lot and fell forward on the asphalt.  Reports bracing himself with his right arm, hitting his right knee on the ground and hitting the right side of his face on the ground.  Denies loss of consciousness, blood thinner use.  Denies any nausea, vomiting, chest pain, shortness of breath, abdominal pain.  States that he was not able to get up by himself but a nearby person helped him to his bilateral.  Subsequently was brought via EMS to the emergency department.  Currently denies any visual disturbance, slurred speech, facial droop, weakness sensation deficit of lower extremities.  Currently complaining of right-sided knee pain, right arm pain, left foot/ankle pain.  Past medical history significant for CAD, cancer, hypertension, hyperlipidemia, retinal attachment, vertigo, A-fib with Watchman device not currently anticoagulated, CHF  Home Medications Prior to Admission medications   Medication Sig Start Date End Date Taking? Authorizing Provider  ALPRAZolam Prudy Feeler) 0.5 MG tablet Take 0.5 mg at bedtime by mouth. Anxiety     [provider]  Ascorbic Acid (VITAMIN C) 1000 MG tablet Take 1,000 mg by mouth daily.    [provider]  aspirin EC 81 MG tablet Take 1 tablet (81 mg total) by mouth daily. Swallow whole. 01/04/21   Tonny Bollman, MD  brimonidine [facility]) 0.2 % ophthalmic solution SMARTSIG:In Eye(s) 05/20/21   [provider]  Calcium Polycarbophil (FIBER-CAPS PO) Take 1 capsule by mouth  daily.    [provider]  carboxymethylcellulose (REFRESH PLUS) 0.5 % SOLN Place 1 drop into the right eye daily as needed (dry eyes).    [provider]  celecoxib (CELEBREX) 200 MG capsule Take 200 mg by mouth 2 (two) times daily. 10/26/22   [provider]  Cholecalciferol (VITAMIN D3) 50 MCG (2000 UT) TABS Take 5,000 Units by mouth 2 (two) times daily.    [provider]  docusate sodium (COLACE) 100 MG capsule Take 100 mg by mouth 2 (two) times daily.    [provider]  FIBER COMPLETE PO Take by mouth in the morning and at bedtime.    [provider]  finasteride (PROSCAR) 5 MG tablet Take 1 tablet (5 mg total) by mouth daily. 08/19/22   McKenzie, Mardene Celeste, MD  furosemide (LASIX) 40 MG tablet Take 1 tablet (40 mg total) by mouth daily. 01/04/23   Hilty, Lisette Abu, MD  metoprolol tartrate (LOPRESSOR) 25 MG tablet Take 0.5 tablets (12.5 mg total) by mouth 2 (two) times daily. 06/10/20   Fenton, Clint R, PA  Misc Natural Products (TART CHERRY ADVANCED PO) Take 1 tablet by mouth daily.     [provider]  mupirocin ointment (BACTROBAN) 2 % Apply 1 application. topically 3 (three) times daily. Patient not taking: Reported on 10/27/2022 08/06/21   [provider]  NON FORMULARY at bedtime. VPAP    [provider]  potassium chloride (KLOR-CON) 10 MEQ tablet Take 10 mEq by mouth daily. 08/06/21   [provider]  simvastatin (ZOCOR) 40 MG tablet Take 40 mg by mouth at bedtime.    [provider]  tadalafil (CIALIS) 20 MG tablet Take 1 tablet (20 mg total) by mouth daily as needed. Patient not taking: Reported on 10/27/2022 08/19/22   Malen Gauze, MD  tamsulosin (FLOMAX) 0.4 MG CAPS capsule Take 1 capsule (0.4 mg total) by mouth in the morning and at bedtime. 08/19/22   McKenzie, Mardene Celeste, MD  Zinc 50 MG TABS Take 50 mg by mouth daily.    [provider]      Allergies    Patient has no  known allergies.    Review of Systems   Review of Systems  All other systems reviewed and are negative.   Physical Exam Updated Vital Signs BP 132/62   Pulse (!) 54   Temp 97.7 F (36.5 C) (Oral)   Resp 16   Ht 5\' 6"  (1.676 m)   Wt 95.3 kg   SpO2 100%   BMI 33.89 kg/m  Physical Exam Vitals and nursing note reviewed.  Constitutional:      General: He is not in acute distress.    Appearance: He is well-developed.  HENT:     Head: Normocephalic.     Comments: Patient with 2.6 cm laceration above right eyebrow with surrounding skin avulsion.  No obvious muscular involvement.  Patient able to raise eyebrows symmetrically.  EOMs intact without pain or obvious proptosis of right eye.  Right-sided periorbital hematoma more superolaterally Eyes:     Conjunctiva/sclera: Conjunctivae normal.  Cardiovascular:     Rate and Rhythm: Normal rate and regular rhythm.  Pulmonary:     Effort: Pulmonary effort is normal. No respiratory distress.     Breath sounds: Normal breath sounds. No wheezing, rhonchi or rales.  Abdominal:     Palpations: Abdomen is soft.     Tenderness: There is no abdominal tenderness. There is no guarding.  Musculoskeletal:        General: No swelling.     Cervical back: Neck supple.     Comments: No midline tenderness cervical, thoracic, lumbar spine without step-off or deformity.  No chest wall tenderness.  Patient with skin avulsion appreciated dorsal aspect of right elbow, dorsal aspect of right hand.  Full range of motion of joints right upper extremity without obvious bony tenderness.  Radial and pedal pulses 2+ bilaterally.  Tender to palpation right knee medial joint line.  Tender palpation left ankle medial malleolus anteriorly.  No other tenderness of lower extremities.  Skin:    General: Skin is warm and dry.     Capillary Refill: Capillary refill takes less than 2 seconds.  Neurological:     Mental Status: He is alert.     Comments: Alert and oriented to  self, place, time and event.   Speech is fluent, clear without dysarthria or dysphasia.   Strength symmetric in upper/lower extremities   Sensation intact in upper/lower extremities   Normal gait.  CN I not tested  CN II not tested CN III, IV, VI PERRLA and EOMs intact bilaterally  CN V Intact sensation to sharp and light touch to the face  CN VII facial movements symmetric  CN VIII not tested  CN IX, X no uvula deviation, symmetric rise of soft palate  CN XI symmetric SCM and trapezius strength bilaterally  CN XII Midline tongue protrusion, symmetric L/R movements     Psychiatric:        Mood and Affect: Mood  normal.     ED Results / Procedures / Treatments   Labs (all labs ordered are listed, but only abnormal results are displayed) Labs Reviewed - No data to display  EKG None  Radiology No results found.  Procedures .Laceration Repair  Date/Time: 06/27/2023 5:47 PM  Performed by: Peter Garter, PA Authorized by: Peter Garter, PA   Consent:    Consent obtained:  Verbal   Consent given by:  Patient   Risks, benefits, and alternatives were discussed: yes     Risks discussed:  Infection, need for additional repair, nerve damage, poor wound healing, poor cosmetic result and pain   Alternatives discussed:  No treatment and delayed treatment Universal protocol:    Procedure explained and questions answered to patient or proxy's satisfaction: yes     Relevant documents present and verified: yes     Patient identity confirmed:  Verbally with patient and arm band Anesthesia:    Anesthesia method:  Local infiltration   Local anesthetic:  Lidocaine 2% WITH epi Laceration details:    Location:  Face   Face location:  R eyebrow   Length (cm):  2.6 Pre-procedure details:    Preparation:  Patient was prepped and draped in usual sterile fashion and imaging obtained to evaluate for foreign bodies Exploration:    Limited defect created (wound extended): no      Hemostasis achieved with:  Direct pressure   Imaging obtained comment:  Ct   Imaging outcome: foreign body not noted     Wound exploration: wound explored through full range of motion and entire depth of wound visualized     Contaminated: no   Treatment:    Area cleansed with:  Saline   Amount of cleaning:  Standard   Irrigation solution:  Sterile saline   Irrigation volume:  250cc   Irrigation method:  Syringe   Visualized foreign bodies/material removed: no     Debridement:  None   Undermining:  None   Scar revision: no   Skin repair:    Repair method:  Sutures   Suture size:  5-0   Suture material:  Fast-absorbing gut   Suture technique:  Simple interrupted   Number of sutures:  3 Approximation:    Approximation:  Close Repair type:    Repair type:  Simple Post-procedure details:    Dressing:  Bulky dressing and non-adherent dressing   Procedure completion:  Tolerated well, no immediate complications .Laceration Repair  Date/Time: 06/27/2023 6:34 PM  Performed by: Peter Garter, PA Authorized by: Peter Garter, PA   Consent:    Consent obtained:  Verbal   Consent given by:  Patient   Risks, benefits, and alternatives were discussed: yes     Risks discussed:  Infection, need for additional repair and nerve damage   Alternatives discussed:  No treatment, observation, delayed treatment and referral Universal protocol:    Procedure explained and questions answered to patient or proxy's satisfaction: yes     Patient identity confirmed:  Verbally with patient and arm band Anesthesia:    Anesthesia method:  None Laceration details:    Location:  Face   Face location:  R eyebrow   Length (cm):  1.2 Pre-procedure details:    Preparation:  Patient was prepped and draped in usual sterile fashion and imaging obtained to evaluate for foreign bodies Exploration:    Limited defect created (wound extended): no     Hemostasis achieved with:  Direct pressure   Imaging  outcome: foreign body not noted     Wound exploration: wound explored through full range of motion and entire depth of wound visualized     Contaminated: no   Skin repair:    Repair method:  Tissue adhesive Approximation:    Approximation:  Close Repair type:    Repair type:  Simple Post-procedure details:    Procedure completion:  Tolerated well, no immediate complications .Laceration Repair  Date/Time: 06/27/2023 6:34 PM  Performed by: Peter Garter, PA Authorized by: Peter Garter, PA   Consent:    Consent obtained:  Verbal   Consent given by:  Patient   Risks, benefits, and alternatives were discussed: yes     Risks discussed:  Need for additional repair, nerve damage, infection, poor cosmetic result, pain and poor wound healing   Alternatives discussed:  No treatment, delayed treatment, observation and referral Universal protocol:    Procedure explained and questions answered to patient or proxy's satisfaction: yes     Patient identity confirmed:  Verbally with patient and arm band Anesthesia:    Anesthesia method:  None Laceration details:    Location:  Hand   Hand location:  R hand, dorsum   Length (cm):  4.3 Pre-procedure details:    Preparation:  Imaging obtained to evaluate for foreign bodies and patient was prepped and draped in usual sterile fashion Exploration:    Limited defect created (wound extended): no     Hemostasis achieved with:  Direct pressure   Imaging obtained: x-ray     Imaging outcome: foreign body not noted     Wound exploration: wound explored through full range of motion and entire depth of wound visualized     Contaminated: no   Treatment:    Area cleansed with:  Saline   Amount of cleaning:  Standard   Irrigation solution:  Sterile saline   Irrigation volume:  100cc   Irrigation method:  Syringe   Visualized foreign bodies/material removed: no     Debridement:  None   Undermining:  None   Scar revision: no   Skin repair:     Repair method:  Tissue adhesive Approximation:    Approximation:  Close Repair type:    Repair type:  Simple Post-procedure details:    Procedure completion:  Tolerated well, no immediate complications .Laceration Repair  Date/Time: 06/27/2023 6:35 PM  Performed by: Peter Garter, PA Authorized by: Peter Garter, PA   Consent:    Consent obtained:  Verbal   Consent given by:  Patient   Risks, benefits, and alternatives were discussed: yes     Risks discussed:  Infection, need for additional repair, nerve damage, poor cosmetic result, poor wound healing and pain   Alternatives discussed:  No treatment, delayed treatment, observation and referral Universal protocol:    Procedure explained and questions answered to patient or proxy's satisfaction: yes     Patient identity confirmed:  Verbally with patient and arm band Anesthesia:    Anesthesia method:  None Laceration details:    Location:  Hand   Hand location:  R hand, dorsum   Length (cm):  6.2 Pre-procedure details:    Preparation:  Imaging obtained to evaluate for foreign bodies and patient was prepped and draped in usual sterile fashion Exploration:    Limited defect created (wound extended): no     Hemostasis achieved with:  Direct pressure   Imaging obtained: x-ray     Imaging outcome: foreign body not noted  Wound exploration: wound explored through full range of motion and entire depth of wound visualized     Contaminated: no   Treatment:    Area cleansed with:  Saline   Amount of cleaning:  Standard   Irrigation solution:  Sterile saline   Irrigation volume:  100cc   Irrigation method:  Syringe   Visualized foreign bodies/material removed: no     Debridement:  None   Undermining:  None   Scar revision: no   Skin repair:    Repair method:  Tissue adhesive Approximation:    Approximation:  Close Repair type:    Repair type:  Simple Post-procedure details:    Dressing:  Open (no dressing)    Procedure completion:  Tolerated well, no immediate complications   {Document cardiac monitor, telemetry assessment procedure when appropriate:1}  Medications Ordered in ED Medications  lidocaine-EPINEPHrine (XYLOCAINE W/EPI) 2 %-1:200000 (PF) injection 10 mL (has no administration in time range)    ED Course/ Medical Decision Making/ A&P   {   Click here for ABCD2, HEART and other calculatorsREFRESH Note before signing :1}                              Medical Decision Making Amount and/or Complexity of Data Reviewed Radiology: ordered.  Risk OTC drugs. Prescription drug management.   This patient presents to the ED for concern of fall, this involves an extensive number of treatment options, and is a complaint that carries with it a high risk of complications and morbidity.  The differential diagnosis includes CVA, strain/pain, dislocation, ligamentous/tendinous injury, neurovascular compromise, pneumothorax, solid organ damage, other   Co morbidities that complicate the patient evaluation  See HPI   Additional history obtained:  Additional history obtained from EMR External records from outside source obtained and reviewed including hospital records   Lab Tests:  N/a   Imaging Studies ordered:  I ordered imaging studies including see below I independently visualized and interpreted imaging which showed  Right hand/wrist x-ray: No acute fracture.  Severe thumb carpometacarpal and dry Osteoarthritis.  Moderate 1st through 5th interphalangeal osteoarthritis. Right upper EXTR: Mild osteoarthritis.  No acute injury. The foot/ankle x-ray: No acute fracture.  Mild to moderate ankle osteoarthritis.  Moderate plantar calcaneal spur. Right knee x-ray to the right knee.  No acute abnormality. CT head/maxillofacial/cervical spine: No acute traumatic injury.  Brain atrophy with relative temporal lobe predominance.  Chronic small vessel ischemic changes.  No facial fracture.   Previous ocular surgery and FBSS.  No acute traumatic findings.  Ordinary spondylosis at C5-6 with mild bony foraminal narrowing on the right. I agree with the radiologist interpretation  Cardiac Monitoring: / EKG:  The patient was maintained on a cardiac monitor.  I personally viewed and interpreted the cardiac monitored which showed an underlying rhythm of: Sinus rhythm   Consultations Obtained:  N/a   Problem List / ED Course / Critical interventions / Medication management  Fall I ordered medication including Tylenol, lidocaine   Reevaluation of the patient after these medicines showed that the patient improved I have reviewed the patients home medicines and have made adjustments as needed   Social Determinants of Health:  Denies tobacco, licit drug use.   Test / Admission - Considered:  Fall Vitals signs within normal range and stable throughout visit. Laboratory/imaging studies significant for: See above 83 year old male presents emergency department after mechanical fall.  Patient tripped in the [place] parking lot  over a raised drain guard and subsequently landed on the asphalt.  Trauma to head, right upper extremity, right knee Worrisome signs and symptoms were discussed with the patient, and the patient acknowledged understanding to return to the ED if noticed. Patient was stable upon discharge.    {Document critical care time when appropriate:1} {Document review of labs and clinical decision tools ie heart score, Chads2Vasc2 etc:1}  {Document your independent review of radiology images, and any outside records:1} {Document your discussion with family members, caretakers, and with consultants:1} {Document social determinants of health affecting pt's care:1} {Document your decision making why or why not admission, treatments were needed:1} Final Clinical Impression(s) / ED Diagnoses Final diagnoses:  None    Rx / DC Orders ED Discharge Orders     None

## 2023-06-27 NOTE — ED Notes (Signed)
Pt ambulated approximately 50 ft tolerated well

## 2023-06-27 NOTE — Discharge Instructions (Addendum)
As discussed, CT imaging of your head, face and neck appeared normal.  No evidence of brain bleed or fracture or dislocation.  X-rays of your right arm, right knee and left foot/ankle were also normal.  The laceration on your forehead was repaired using absorbable stitches.  You should not need to have these removed unless they become problematic such as causing increased irritation.  They should fall out as the wound heals.  Information attached your discharge papers regarding wound care at home regarding your skin tears.  Recommend at least daily bandage changes with application of antibiotic ointment.  Will place you on antibiotics to prevent infection.  Recommend close follow-up with your primary care in the outpatient setting for reassessment.  Please not hesitate to return to emergency department if he worrisome signs and symptoms we discussed become apparent.

## 2023-07-03 DIAGNOSIS — M79672 Pain in left foot: Secondary | ICD-10-CM | POA: Diagnosis not present

## 2023-07-03 DIAGNOSIS — L03032 Cellulitis of left toe: Secondary | ICD-10-CM | POA: Diagnosis not present

## 2023-07-03 DIAGNOSIS — L600 Ingrowing nail: Secondary | ICD-10-CM | POA: Diagnosis not present

## 2023-07-03 DIAGNOSIS — M79675 Pain in left toe(s): Secondary | ICD-10-CM | POA: Diagnosis not present

## 2023-07-06 DIAGNOSIS — M25561 Pain in right knee: Secondary | ICD-10-CM | POA: Diagnosis not present

## 2023-07-11 ENCOUNTER — Ambulatory Visit
Admission: EM | Admit: 2023-07-11 | Discharge: 2023-07-11 | Disposition: A | Discharge status: Home / Self Care | Payer: Medicare Other | Attending: Family Medicine | Admitting: Family Medicine

## 2023-07-11 DIAGNOSIS — I776 Arteritis, unspecified: Secondary | ICD-10-CM

## 2023-07-11 DIAGNOSIS — M25561 Pain in right knee: Secondary | ICD-10-CM | POA: Diagnosis not present

## 2023-07-11 NOTE — Discharge Instructions (Signed)
 You have had labs (blood test including platelet count) sent today. We will call you with any significant abnormalities or if there is need to begin or change treatment or pursue further follow up.  You may also review your test results online through MyChart. If you do not have a MyChart account, instructions to sign up should be on your discharge paperwork.

## 2023-07-11 NOTE — ED Triage Notes (Signed)
Pt reports while at physical therapy they noticed a large red area on the lower right part of his shin. Pts leg is swollen as well. PT sent him to UC to rule out a blood clot.  Area has been on his leg x 3 days  Pt it is painful and throbbing.

## 2023-07-12 LAB — CBC WITH DIFFERENTIAL/PLATELET
Basophils Absolute: 0 10*3/uL (ref 0.0–0.2)
Basos: 1 %
EOS (ABSOLUTE): 0.2 10*3/uL (ref 0.0–0.4)
Eos: 4 %
Hematocrit: 41.2 % (ref 37.5–51.0)
Hemoglobin: 13.3 g/dL (ref 13.0–17.7)
Immature Grans (Abs): 0 10*3/uL (ref 0.0–0.1)
Immature Granulocytes: 0 %
Lymphocytes Absolute: 1.4 10*3/uL (ref 0.7–3.1)
Lymphs: 23 %
MCH: 29.2 pg (ref 26.6–33.0)
MCHC: 32.3 g/dL (ref 31.5–35.7)
MCV: 90 fL (ref 79–97)
Monocytes Absolute: 0.5 10*3/uL (ref 0.1–0.9)
Monocytes: 8 %
Neutrophils Absolute: 4.1 10*3/uL (ref 1.4–7.0)
Neutrophils: 64 %
Platelets: 106 10*3/uL — ABNORMAL LOW (ref 150–450)
RBC: 4.56 x10E6/uL (ref 4.14–5.80)
RDW: 13.4 % (ref 11.6–15.4)
WBC: 6.3 10*3/uL (ref 3.4–10.8)

## 2023-07-12 NOTE — ED Provider Notes (Signed)
 [facility] CARE CENTER   [id number] 07/11/23 Arrival Time: 1300  ASSESSMENT & PLAN:  1. Vasculitis (HCC)    Unclear etiology. Stable thrombocytopenia. Stop ice packs to area. He is comfortable with home observation. Will return here, PCP, or ED if he notes rapid worsening.  Results for orders placed or performed during the hospital encounter of 07/11/23  CBC with Differential/Platelet   Collection Time: 07/11/23  5:23 PM  Result Value Ref Range   WBC 6.3 3.4 - 10.8 x10E3/uL   RBC 4.56 4.14 - 5.80 x10E6/uL   Hemoglobin 13.3 13.0 - 17.7 g/dL   Hematocrit 58.7 62.4 - 51.0 %   MCV 90 79 - 97 fL   MCH 29.2 26.6 - 33.0 pg   MCHC 32.3 31.5 - 35.7 g/dL   RDW 86.5 88.3 - 84.5 %   Platelets 106 (L) 150 - 450 x10E3/uL   Neutrophils 64 Not Estab. %   Lymphs 23 Not Estab. %   Monocytes 8 Not Estab. %   Eos 4 Not Estab. %   Basos 1 Not Estab. %   Neutrophils Absolute 4.1 1.4 - 7.0 x10E3/uL   Lymphocytes Absolute 1.4 0.7 - 3.1 x10E3/uL   Monocytes Absolute 0.5 0.1 - 0.9 x10E3/uL   EOS (ABSOLUTE) 0.2 0.0 - 0.4 x10E3/uL   Basophils Absolute 0.0 0.0 - 0.2 x10E3/uL   Immature Granulocytes 0 Not Estab. %   Immature Grans (Abs) 0.0 0.0 - 0.1 x10E3/uL    Follow-up Information     Schedule an appointment as soon as possible for a visit  with Marvine Rush, MD.   Specialty: Family Medicine Why: For a follow up. Contact information: [address] [place] [address] [phone]         [facility] Emergency Department at [facility].   Specialty: Emergency Medicine Why: If the area on your leg worsens in any way. Contact information: [address] [place]  [address] [phone]                Reviewed expectations re: course of current medical issues. Questions answered. Outlined signs and symptoms indicating need for more acute intervention. Understanding verbalized. After Visit Summary given.   SUBJECTIVE: History from:  Patient. Paul Lam is a 83 y.o. male. Reports noting red area of R anterior LE; x 3 days approx. Ques if he hit it on something. Has been putting ice pack on area. Ques mild expansion of redness over past few days. Denies fever. Normal ambulation. Chronic and significant LE edema; on Lasix . Area is painful. Denies new medications.  OBJECTIVE:  Vitals:   07/11/23 1320  BP: 134/80  Pulse: 69  Resp: 16  Temp: (!) 97.4 F (36.3 C)  TempSrc: Oral  SpO2: 96%    General appearance: alert; no distress Lungs: speaks full sentences without difficulty; unlabored CV: reg Extremities: 2++ edema; anterior R lower leg with approx 8 x 10 cm area of red/violaceous and confluent skin changes with surrounding petechiae; is TTP  Skin: warm and dry Neurologic: normal gait Psychological: alert and cooperative; normal mood and affect  Labs: Results for orders placed or performed during the hospital encounter of 07/11/23  CBC with Differential/Platelet   Collection Time: 07/11/23  5:23 PM  Result Value Ref Range   WBC 6.3 3.4 - 10.8 x10E3/uL   RBC 4.56 4.14 - 5.80 x10E6/uL   Hemoglobin 13.3 13.0 - 17.7 g/dL   Hematocrit 58.7 62.4 - 51.0 %   MCV 90 79 - 97  fL   MCH 29.2 26.6 - 33.0 pg   MCHC 32.3 31.5 - 35.7 g/dL   RDW 86.5 88.3 - 84.5 %   Platelets 106 (L) 150 - 450 x10E3/uL   Neutrophils 64 Not Estab. %   Lymphs 23 Not Estab. %   Monocytes 8 Not Estab. %   Eos 4 Not Estab. %   Basos 1 Not Estab. %   Neutrophils Absolute 4.1 1.4 - 7.0 x10E3/uL   Lymphocytes Absolute 1.4 0.7 - 3.1 x10E3/uL   Monocytes Absolute 0.5 0.1 - 0.9 x10E3/uL   EOS (ABSOLUTE) 0.2 0.0 - 0.4 x10E3/uL   Basophils Absolute 0.0 0.0 - 0.2 x10E3/uL   Immature Granulocytes 0 Not Estab. %   Immature Grans (Abs) 0.0 0.0 - 0.1 x10E3/uL   Labs Reviewed  CBC WITH DIFFERENTIAL/PLATELET - Abnormal; Notable for the following components:      Result Value   Platelets 106 (*)    All other components within normal limits    Narrative:    Performed at:  [address] [employer] [address] [place], [place]  [id number] Lab Director: Frankey Sas MD, Phone:  [phone]    Imaging: No results found.  No Known Allergies  Past Medical History:  Diagnosis Date   Anginal pain (HCC)    Anxiety    Arthritis    generalized arthritis    Atrial fibrillation (HCC)    CAD (coronary artery disease)    Cancer (HCC)    skin cancer removed left ear     Complication of anesthesia    Depression    Headache    History of echocardiogram 02/19/2009   EF 50-55%; LA mild-mod dilated;    History of kidney stones    History of nuclear stress test 02/19/2009   low risk, normal    Hyperlipidemia    Hypertension    Obesity (BMI 30-39.9)    OSA (obstructive sleep apnea)    severe with AHI 25/hr now on BiPAP   PONV (postoperative nausea and vomiting)    Retinal detachment    with proliferative vitreoretinopathy   Shortness of breath    related to weight    Sleep apnea    Vertigo    Social History   Socioeconomic History   Marital status: Married    Spouse name: Not on file   Number of children: 1   Years of education: Not on file   Highest education level: Not on file  Occupational History   Not on file  Tobacco Use   Smoking status: Never   Smokeless tobacco: Never  Vaping Use   Vaping status: Never Used  Substance and Sexual Activity   Alcohol  use: No   Drug use: No   Sexual activity: Not on file  Other Topics Concern   Not on file  Social History Narrative   Not on file   Social Drivers of Health   Financial Resource Strain: Low Risk  (02/16/2022)   Overall Financial Resource Strain (CARDIA)    Difficulty of Paying Living Expenses: Not hard at all  Food Insecurity: Not on file  Transportation Needs: No Transportation Needs (02/16/2022)   PRAPARE - Administrator, Civil Service (Medical): No    Lack of Transportation (Non-Medical): No  Physical Activity: Not on file  Stress: Not  on file  Social Connections: Not on file  Intimate Partner Violence: Not on file   Family History  Problem Relation Age of Onset   Heart failure Mother  also arthritis   Stroke Father        also emphysema   Heart failure Father    Past Surgical History:  Procedure Laterality Date   APPENDECTOMY     BIOPSY  03/05/2020   Procedure: BIOPSY;  Surgeon: Golda Claudis PENNER, MD;  Location: [facility];  Service: Endoscopy;;   CARDIAC CATHETERIZATION  07/13/2005   noncritical CAD   CARDIOVERSION N/A 01/29/2018   Procedure: CARDIOVERSION;  Surgeon: Maranda Leim DEL, MD;  Location: [facility] ENDOSCOPY;  Service: Cardiovascular;  Laterality: N/A;   COLONOSCOPY WITH PROPOFOL  N/A 03/06/2020   Procedure: COLONOSCOPY WITH PROPOFOL ;  Surgeon: Eartha Angelia Sieving, MD;  Location: [facility];  Service: Gastroenterology;  Laterality: N/A;   ESOPHAGOGASTRODUODENOSCOPY (EGD) WITH PROPOFOL  N/A 03/05/2020   Procedure: ESOPHAGOGASTRODUODENOSCOPY (EGD) WITH PROPOFOL ;  Surgeon: Golda Claudis PENNER, MD;  Location: [facility];  Service: Endoscopy;  Laterality: N/A;   EYE SURGERY     FLEXIBLE SIGMOIDOSCOPY N/A 07/14/2015   Procedure: FLEXIBLE SIGMOIDOSCOPY;  Surgeon: Oneil Budge, MD;  Location: [facility];  Service: Gastroenterology;  Laterality: N/A;   GIVENS CAPSULE STUDY N/A 04/15/2020   Procedure: GIVENS CAPSULE STUDY;  Surgeon: Eartha Angelia Sieving, MD;  Location: [facility];  Service: Gastroenterology;  Laterality: N/A;  730   JOINT REPLACEMENT     LEFT ATRIAL APPENDAGE OCCLUSION N/A 07/09/2020   Procedure: LEFT ATRIAL APPENDAGE OCCLUSION;  Surgeon: Wonda Sharper, MD;  Location: [facility] INVASIVE CV LAB;  Service: Cardiovascular;  Laterality: N/A;   NASAL SEPTOPLASTY W/ TURBINOPLASTY Bilateral 12/19/2017   Procedure: NASAL SEPTOPLASTY WITH TURBINATE REDUCTION;  Surgeon: Karis Clunes, MD;  Location: [facility];  Service: ENT;  Laterality: Bilateral;   NASAL SINUS SURGERY     PARS PLANA  VITRECTOMY Left 06/20/2017   Procedure: PARS PLANA VITRECTOMY WITH 25 GAUGE, REPAIR OF COMPLEX TRACTION RETINAL DETACHMENT, MEMBRANE PEEEL;  Surgeon: Alvia Norleen BIRCH, MD;  Location: [facility] OR;  Service: Ophthalmology;  Laterality: Left;   POLYPECTOMY  03/06/2020   Procedure: POLYPECTOMY;  Surgeon: Eartha Angelia Sieving, MD;  Location: [facility];  Service: Gastroenterology;;   REPAIR OF COMPLEX TRACTION RETINAL DETACHMENT Left 06/20/2017   RETINAL DETACHMENT SURGERY Right 04/2011   SCLERAL BUCKLE WITH POSSIBLE 25 GAUGE PARS PLANA VITRECTOMY Left 05/22/2017   Procedure: SCLERAL BUCKLE, LASER, GAS INJECTION LEFT EYE;  Surgeon: Alvia Norleen BIRCH, MD;  Location: [facility] OR;  Service: Ophthalmology;  Laterality: Left;   SKIN CANCER EXCISION Left 2013   left ear skin ca removed  2 weeks ago - not completeely healed 08/15/11    TEE WITHOUT CARDIOVERSION N/A 07/09/2020   Procedure: TRANSESOPHAGEAL ECHOCARDIOGRAM (TEE);  Surgeon: Wonda Sharper, MD;  Location: [facility] INVASIVE CV LAB;  Service: Cardiovascular;  Laterality: N/A;   TEE WITHOUT CARDIOVERSION N/A 08/31/2020   Procedure: TRANSESOPHAGEAL ECHOCARDIOGRAM (TEE);  Surgeon: Barbaraann Darryle Ned, MD;  Location: [facility] ENDOSCOPY;  Service: Cardiovascular;  Laterality: N/A;   TONSILLECTOMY     TOTAL KNEE ARTHROPLASTY  08/22/2011   Procedure: TOTAL KNEE ARTHROPLASTY;  Surgeon: Dempsey LULLA Moan, MD;  Location: [facility] ORS;  Service: Orthopedics;  Laterality: Right;     Rolinda Rogue, MD 07/12/23 1149

## 2023-07-14 DIAGNOSIS — M25561 Pain in right knee: Secondary | ICD-10-CM | POA: Diagnosis not present

## 2023-07-15 ENCOUNTER — Other Ambulatory Visit: Payer: Self-pay

## 2023-07-15 ENCOUNTER — Emergency Department (HOSPITAL_COMMUNITY): Payer: Medicare Other

## 2023-07-15 ENCOUNTER — Encounter (HOSPITAL_COMMUNITY): Payer: Self-pay

## 2023-07-15 ENCOUNTER — Emergency Department (HOSPITAL_COMMUNITY)
Admission: EM | Admit: 2023-07-15 | Discharge: 2023-07-15 | Disposition: A | Discharge status: Home / Self Care | Payer: Medicare Other | Attending: Emergency Medicine | Admitting: Emergency Medicine

## 2023-07-15 DIAGNOSIS — I2510 Atherosclerotic heart disease of native coronary artery without angina pectoris: Secondary | ICD-10-CM | POA: Diagnosis not present

## 2023-07-15 DIAGNOSIS — Z79899 Other long term (current) drug therapy: Secondary | ICD-10-CM | POA: Insufficient documentation

## 2023-07-15 DIAGNOSIS — Z85828 Personal history of other malignant neoplasm of skin: Secondary | ICD-10-CM | POA: Insufficient documentation

## 2023-07-15 DIAGNOSIS — R21 Rash and other nonspecific skin eruption: Secondary | ICD-10-CM | POA: Insufficient documentation

## 2023-07-15 DIAGNOSIS — I10 Essential (primary) hypertension: Secondary | ICD-10-CM | POA: Diagnosis not present

## 2023-07-15 DIAGNOSIS — M79604 Pain in right leg: Secondary | ICD-10-CM | POA: Diagnosis not present

## 2023-07-15 DIAGNOSIS — Z7982 Long term (current) use of aspirin: Secondary | ICD-10-CM | POA: Insufficient documentation

## 2023-07-15 DIAGNOSIS — R600 Localized edema: Secondary | ICD-10-CM | POA: Diagnosis not present

## 2023-07-15 DIAGNOSIS — M7989 Other specified soft tissue disorders: Secondary | ICD-10-CM | POA: Diagnosis not present

## 2023-07-15 LAB — BASIC METABOLIC PANEL
Anion gap: 7 (ref 5–15)
BUN: 19 mg/dL (ref 8–23)
CO2: 29 mmol/L (ref 22–32)
Calcium: 8.6 mg/dL — ABNORMAL LOW (ref 8.9–10.3)
Chloride: 103 mmol/L (ref 98–111)
Creatinine, Ser: 0.92 mg/dL (ref 0.61–1.24)
GFR, Estimated: >60 mL/min (ref >60)
Glucose, Bld: 103 mg/dL — ABNORMAL HIGH (ref 70–99)
Potassium: 4.4 mmol/L (ref 3.5–5.1)
Sodium: 139 mmol/L (ref 135–145)

## 2023-07-15 LAB — CBC WITH DIFFERENTIAL/PLATELET
Abs Immature Granulocytes: 0.02 10*3/uL (ref 0.00–0.07)
Basophils Absolute: 0 10*3/uL (ref 0.0–0.1)
Basophils Relative: 0 %
Eosinophils Absolute: 0.2 10*3/uL (ref 0.0–0.5)
Eosinophils Relative: 3 %
HCT: 39.3 % (ref 39.0–52.0)
Hemoglobin: 12.5 g/dL — ABNORMAL LOW (ref 13.0–17.0)
Immature Granulocytes: 0 %
Lymphocytes Relative: 17 %
Lymphs Abs: 1 10*3/uL (ref 0.7–4.0)
MCH: 29.6 pg (ref 26.0–34.0)
MCHC: 31.8 g/dL (ref 30.0–36.0)
MCV: 92.9 fL (ref 80.0–100.0)
Monocytes Absolute: 0.5 10*3/uL (ref 0.1–1.0)
Monocytes Relative: 8 %
Neutro Abs: 4.4 10*3/uL (ref 1.7–7.7)
Neutrophils Relative %: 72 %
Platelets: 100 10*3/uL — ABNORMAL LOW (ref 150–400)
RBC: 4.23 MIL/uL (ref 4.22–5.81)
RDW: 14.4 % (ref 11.5–15.5)
WBC: 6.1 10*3/uL (ref 4.0–10.5)
nRBC: 0 % (ref 0.0–0.2)

## 2023-07-15 LAB — PROTIME-INR
INR: 1.2 (ref 0.8–1.2)
Prothrombin Time: 15.3 s — ABNORMAL HIGH (ref 11.4–15.2)

## 2023-07-15 MED ORDER — ACETAMINOPHEN 500 MG PO TABS
1000.0000 mg | ORAL_TABLET | Freq: Once | ORAL | Status: AC
  Administered 2023-07-15: 1000 mg via ORAL
  Filled 2023-07-15: qty 2

## 2023-07-15 MED ORDER — OXYCODONE HCL 5 MG PO TABS
5.0000 mg | ORAL_TABLET | Freq: Once | ORAL | Status: AC
Start: 2023-07-15 — End: 2023-07-15
  Administered 2023-07-15: 5 mg via ORAL
  Filled 2023-07-15: qty 1

## 2023-07-15 MED ORDER — CEPHALEXIN 500 MG PO CAPS: 500.0000 mg | ORAL_CAPSULE | Freq: Four times a day (QID) | ORAL | 0 refills | Status: DC

## 2023-07-15 NOTE — ED Notes (Signed)
 Patient right knee is swollen with a bruise to the right shin with no redness or warmth. Patient states it is painful when walking. Patient states he fell 3 weeks ago.

## 2023-07-15 NOTE — ED Notes (Signed)
 [place] at bedside

## 2023-07-15 NOTE — ED Provider Notes (Signed)
 [place] EMERGENCY DEPARTMENT AT [facility] Provider Note   CSN: [id number] Arrival date & time: 07/15/23  9351     History  Chief Complaint  Patient presents with   Leg Pain    Paul Lam is a 83 y.o. male with PMH as listed below who presents with right leg pain and redness slowly developing x 1 week, worsening. Associated swelling/redness.  Pt was seen at [facility] on 2/4 and was told he had vasculitis. Per chart review they observed a 8 x 10 cm area of red/violaceous and confluent skin changes w/ surrounding petechiae, TTP. Had labs drawn which showed no leukocytosis, platelets 106. Had no fever. Today has worsening throbbing, burning pain. Years ago had bleeding problems on eliquis  and so had watchman procedure for Afib and was taken off of eliquis . No h/o DVT/PE, no recent travel/surgeries/hospitalizations. Clemens in a parking lot on 06/27/23, was evaluated for that with neg head/max face/c-spine CTs.   Past Medical History:  Diagnosis Date   Anginal pain (HCC)    Anxiety    Arthritis    generalized arthritis    Atrial fibrillation (HCC)    CAD (coronary artery disease)    Cancer (HCC)    skin cancer removed left ear     Complication of anesthesia    Depression    Headache    History of echocardiogram 02/19/2009   EF 50-55%; LA mild-mod dilated;    History of kidney stones    History of nuclear stress test 02/19/2009   low risk, normal    Hyperlipidemia    Hypertension    Obesity (BMI 30-39.9)    OSA (obstructive sleep apnea)    severe with AHI 25/hr now on BiPAP   PONV (postoperative nausea and vomiting)    Retinal detachment    with proliferative vitreoretinopathy   Shortness of breath    related to weight    Sleep apnea    Vertigo        Home Medications Prior to Admission medications   Medication Sig Start Date End Date Taking? Authorizing Provider  acetaminophen  (TYLENOL ) 500 MG tablet Take 1,000 mg by mouth every 6 (six) hours as needed for mild  pain (pain score 1-3).    [provider]  ALPRAZolam  (XANAX ) 0.5 MG tablet Take 0.5 mg at bedtime by mouth. Anxiety     [provider]  Ascorbic Acid (VITAMIN C) 1000 MG tablet Take 1,000 mg by mouth daily.    [provider]  aspirin  EC 81 MG tablet Take 1 tablet (81 mg total) by mouth daily. Swallow whole. 01/04/21   Wonda Sharper, MD  bacitracin -polymyxin b  (POLYSPORIN ) ointment Apply 1 Application topically 2 (two) times daily.    [provider]  brimonidine  (ALPHAGAN ) 0.2 % ophthalmic solution Place 1 drop into the left eye in the morning and at bedtime. 05/20/21   [provider]  Calcium Polycarbophil (FIBER-CAPS PO) Take 1 capsule by mouth daily.    [provider]  carboxymethylcellulose (REFRESH PLUS) 0.5 % SOLN Place 1 drop into the right eye daily as needed (dry eyes).    [provider]  celecoxib  (CELEBREX ) 200 MG capsule Take 200 mg by mouth 2 (two) times daily. 10/26/22   [provider]  Cholecalciferol (VITAMIN D3) 50 MCG (2000 UT) TABS Take 4,000 Units by mouth daily.    [provider]  cyanocobalamin  (VITAMIN B12) 500 MCG tablet Take 500 mcg by mouth daily.    [provider]  docusate sodium  (COLACE) 100 MG capsule Take 100 mg by mouth daily.    [provider]  finasteride  (PROSCAR ) 5 MG tablet Take 1 tablet (5 mg total) by mouth daily. 08/19/22   McKenzie, Belvie CROME, MD  furosemide  (LASIX ) 40 MG tablet Take 1 tablet (40 mg total) by mouth daily. 01/04/23   Hilty, Vinie BROCKS, MD  ibuprofen (ADVIL) 100 MG tablet Take 200 mg by mouth every 6 (six) hours as needed for fever.    [provider]  metoprolol  tartrate (LOPRESSOR ) 25 MG tablet Take 0.5 tablets (12.5 mg total) by mouth 2 (two) times daily. 06/10/20   Fenton, Clint R, PA  Misc Natural Products (TART CHERRY ADVANCED PO) Take 1 tablet by mouth daily.     [provider]  NON FORMULARY at bedtime. CPAP     [provider]  potassium chloride  (KLOR-CON ) 10 MEQ tablet Take 10 mEq by mouth daily. 08/06/21   [provider]  predniSONE  (DELTASONE ) 20 MG tablet Take 2 tablets (40 mg total) by mouth daily with breakfast for 5 days, THEN 1 tablet (20 mg total) daily with breakfast for 5 days. 07/20/23 07/30/23  Mdala-Gausi, Masiku Agatha, MD  simvastatin  (ZOCOR ) 40 MG tablet Take 40 mg by mouth at bedtime.    [provider]  tamsulosin  (FLOMAX ) 0.4 MG CAPS capsule Take 1 capsule (0.4 mg total) by mouth in the morning and at bedtime. 08/19/22   McKenzie, Belvie CROME, MD  Zinc  50 MG TABS Take 50 mg by mouth daily.    [provider]      Allergies    Patient has no known allergies.    Review of Systems   Review of Systems A 10 point review of systems was performed and is negative unless otherwise reported in HPI.  Physical Exam Updated Vital Signs BP (!) 104/53   Pulse (!) 49   Temp 98 F (36.7 C)   Resp 18   Ht 5' 6 (1.676 m)   Wt 95.3 kg   SpO2 100%   BMI 33.91 kg/m  Physical Exam General: Normal appearing elderly male, lying in bed.  HEENT: PERRLA, Sclera anicteric, MMM, trachea midline.  Cardiology: RRR, no murmurs/rubs/gallops. BL radial and DP pulses equal bilaterally.  Resp: Normal respiratory rate and effort. CTAB, no wheezes, rhonchi, crackles.  Abd: Soft, non-tender, non-distended. No rebound tenderness or guarding.  GU: Deferred. MSK: Well-defined but irregular area of erythema to anterior RLE. No circumferential erythema with significant TTP. Some areas of purpura/petechiae. No raised patches. No crepitus or bullae noted. Peripheral pulses bilaterally difficult to palpate due to bilateral 2+ pitting edema mildly worse on the right, but toes are warm with good cap refill. Mild wound to right great toe, nonerythematous, no purulent drainage.  Extremities without deformity or TTP. No cyanosis or clubbing. Neuro: A&Ox4, CNs II-XII grossly intact. MAEs.  Sensation grossly intact.  Psych: Normal mood and affect.        ED Results / Procedures / Treatments   Labs (all labs ordered are listed, but only abnormal results are displayed) Labs Reviewed  CBC WITH DIFFERENTIAL/PLATELET - Abnormal; Notable for the following components:      Result Value   Hemoglobin 12.5 (*)    Platelets 100 (*)    All other components within normal limits  BASIC METABOLIC PANEL - Abnormal; Notable for the following components:   Glucose, Bld 103 (*)    Calcium 8.6 (*)    All other components  within normal limits  PROTIME-INR - Abnormal; Notable for the following components:   Prothrombin Time 15.3 (*)    All other components within normal limits    EKG None  Radiology No results found.  Procedures Procedures    Medications Ordered in ED Medications  oxyCODONE  (Oxy IR/ROXICODONE ) immediate release tablet 5 mg (5 mg Oral Given 07/15/23 0920)  acetaminophen  (TYLENOL ) tablet 1,000 mg (1,000 mg Oral Given 07/15/23 0920)    ED Course/ Medical Decision Making/ A&P                          Medical Decision Making Amount and/or Complexity of Data Reviewed Labs: ordered. Decision-making details documented in ED Course. Radiology:  Decision-making details documented in ED Course.  Risk OTC drugs. Prescription drug management.    This patient presents to the ED for concern of R leg swelling/pain/redness, this involves an extensive number of treatment options, and is a complaint that carries with it a high risk of complications and morbidity.  I considered the following differential and admission for this acute, potentially life threatening condition. Patient is overall well-appearing and HDS.   MDM:    Consider DVT, cellulitis, or vasculitis. Patient is afebrile, HDS, no c/f sepsis. Patient with no risk factors for DVT, and RLE US  is negative reassuringly. He hasn't had any systemic symptoms to indicate vasculitis. Has possible purpura/petechiae on  exam with known h/o thrombocytopenia, but this is stable from prior values at ~100. Symptoms have slowly developed and patient is HDS, no c/f necrotizing infection. Will treat for possible cellulitis with keflex  and have patient closely f/u with PCP to evaluate for improvement on abx or possible primary vasculitis. Patient and wife report understanding of plan. DC w/ discharge instructions/return precautions. All questions answered to patient's satisfaction.    Clinical Course as of 07/21/23 0027  Sat Jul 15, 2023  0934 US  Venous Img Lower Right (DVT Study) No evidence of femoropopliteal DVT or superficial thrombophlebitis within the RIGHT lower extremity.   [HN]  0954 CBC with Differential(!) Stable thrombocytopenia [HN]  0954 Basic metabolic panel(!) wnl [HN]  0954 No leukocytosis [HN]    Clinical Course User Index [HN] Franklyn Sid SAILOR, MD    Labs: I Ordered, and personally interpreted labs.  The pertinent results include:  those listed above  Imaging Studies ordered: I ordered imaging studies including RLE DVT US  I independently visualized and interpreted imaging. I agree with the radiologist interpretation  Additional history obtained from chart review.    Reevaluation: After the interventions noted above, I reevaluated the patient and found that they have :stayed the same  Social Determinants of Health: Lives independently  Disposition:  DC  Co morbidities that complicate the patient evaluation  Past Medical History:  Diagnosis Date   Anginal pain (HCC)    Anxiety    Arthritis    generalized arthritis    Atrial fibrillation (HCC)    CAD (coronary artery disease)    Cancer (HCC)    skin cancer removed left ear     Complication of anesthesia    Depression    Headache    History of echocardiogram 02/19/2009   EF 50-55%; LA mild-mod dilated;    History of kidney stones    History of nuclear stress test 02/19/2009   low risk, normal    Hyperlipidemia     Hypertension    Obesity (BMI 30-39.9)    OSA (obstructive sleep apnea)  severe with AHI 25/hr now on BiPAP   PONV (postoperative nausea and vomiting)    Retinal detachment    with proliferative vitreoretinopathy   Shortness of breath    related to weight    Sleep apnea    Vertigo      Medicines Meds ordered this encounter  Medications   oxyCODONE  (Oxy IR/ROXICODONE ) immediate release tablet 5 mg    Refill:  0   acetaminophen  (TYLENOL ) tablet 1,000 mg   DISCONTD: cephALEXin  (KEFLEX ) 500 MG capsule    Sig: Take 1 capsule (500 mg total) by mouth 4 (four) times daily.    Dispense:  20 capsule    Refill:  0    I have reviewed the patients home medicines and have made adjustments as needed  Problem List / ED Course: Problem List Items Addressed This Visit   None Visit Diagnoses       Right leg pain    -  Primary     Erythematous rash                       This note was created using dictation software, which may contain spelling or grammatical errors.    Franklyn Sid SAILOR, MD 07/21/23 [phone]

## 2023-07-15 NOTE — ED Triage Notes (Signed)
 Pt c/o rt leg pain. Pt was seen at [facility] on 2/4 and was told he had vasculitis and there was nothing they could do to help. Pt states that this has been ongoing for a week and today has worsening burning pain. Red blotches noted to rt leg towards ankle area. Pt has bilateral edema to legs.

## 2023-07-15 NOTE — Discharge Instructions (Addendum)
 Thank you for coming to [facility] Emergency Department. You were seen for pain and swelling as well as rash on the right lower extremity.. We did an exam, labs, and imaging, and these showed no blood clot in your leg.  We will try an antibiotic for possible cellulitis, or skin infection.  It is also possible you have vasculitis that we will need to be seen by an outpatient provider. Please follow up with your primary care provider within 1 week.   Do not hesitate to return to the ED or call 911 if you experience: -Worsening symptoms -Spreading of the rash -Acutely worsening pain  -Lightheadedness, passing out -Fevers/chills -Anything else that concerns you

## 2023-07-17 DIAGNOSIS — L03032 Cellulitis of left toe: Secondary | ICD-10-CM | POA: Diagnosis not present

## 2023-07-17 DIAGNOSIS — L600 Ingrowing nail: Secondary | ICD-10-CM | POA: Diagnosis not present

## 2023-07-17 DIAGNOSIS — M79675 Pain in left toe(s): Secondary | ICD-10-CM | POA: Diagnosis not present

## 2023-07-17 DIAGNOSIS — M79672 Pain in left foot: Secondary | ICD-10-CM | POA: Diagnosis not present

## 2023-07-19 ENCOUNTER — Other Ambulatory Visit: Payer: Self-pay

## 2023-07-19 ENCOUNTER — Observation Stay (HOSPITAL_COMMUNITY)
Admission: EM | Admit: 2023-07-19 | Discharge: 2023-07-20 | Disposition: A | Discharge status: Home / Self Care | Payer: Medicare Other | Attending: Internal Medicine | Admitting: Internal Medicine

## 2023-07-19 ENCOUNTER — Encounter (HOSPITAL_COMMUNITY): Payer: Self-pay

## 2023-07-19 DIAGNOSIS — N401 Enlarged prostate with lower urinary tract symptoms: Secondary | ICD-10-CM | POA: Diagnosis present

## 2023-07-19 DIAGNOSIS — G4733 Obstructive sleep apnea (adult) (pediatric): Secondary | ICD-10-CM | POA: Diagnosis not present

## 2023-07-19 DIAGNOSIS — Z85828 Personal history of other malignant neoplasm of skin: Secondary | ICD-10-CM | POA: Insufficient documentation

## 2023-07-19 DIAGNOSIS — L03115 Cellulitis of right lower limb: Principal | ICD-10-CM | POA: Diagnosis present

## 2023-07-19 DIAGNOSIS — L958 Other vasculitis limited to the skin: Secondary | ICD-10-CM | POA: Diagnosis not present

## 2023-07-19 DIAGNOSIS — I776 Arteritis, unspecified: Secondary | ICD-10-CM | POA: Diagnosis not present

## 2023-07-19 DIAGNOSIS — I10 Essential (primary) hypertension: Secondary | ICD-10-CM | POA: Diagnosis present

## 2023-07-19 DIAGNOSIS — Z96651 Presence of right artificial knee joint: Secondary | ICD-10-CM | POA: Diagnosis not present

## 2023-07-19 DIAGNOSIS — R2243 Localized swelling, mass and lump, lower limb, bilateral: Secondary | ICD-10-CM | POA: Diagnosis not present

## 2023-07-19 DIAGNOSIS — L03119 Cellulitis of unspecified part of limb: Secondary | ICD-10-CM | POA: Diagnosis not present

## 2023-07-19 DIAGNOSIS — I5032 Chronic diastolic (congestive) heart failure: Secondary | ICD-10-CM | POA: Insufficient documentation

## 2023-07-19 DIAGNOSIS — I482 Chronic atrial fibrillation, unspecified: Secondary | ICD-10-CM | POA: Diagnosis not present

## 2023-07-19 DIAGNOSIS — D696 Thrombocytopenia, unspecified: Secondary | ICD-10-CM | POA: Diagnosis not present

## 2023-07-19 DIAGNOSIS — I110 Hypertensive heart disease with heart failure: Secondary | ICD-10-CM | POA: Diagnosis not present

## 2023-07-19 DIAGNOSIS — Z6838 Body mass index (BMI) 38.0-38.9, adult: Secondary | ICD-10-CM | POA: Diagnosis not present

## 2023-07-19 DIAGNOSIS — Z7982 Long term (current) use of aspirin: Secondary | ICD-10-CM | POA: Diagnosis not present

## 2023-07-19 DIAGNOSIS — E6609 Other obesity due to excess calories: Secondary | ICD-10-CM | POA: Diagnosis not present

## 2023-07-19 DIAGNOSIS — Z789 Other specified health status: Secondary | ICD-10-CM

## 2023-07-19 LAB — BASIC METABOLIC PANEL
Anion gap: 11 (ref 5–15)
BUN: 19 mg/dL (ref 8–23)
CO2: 27 mmol/L (ref 22–32)
Calcium: 8.9 mg/dL (ref 8.9–10.3)
Chloride: 102 mmol/L (ref 98–111)
Creatinine, Ser: 0.88 mg/dL (ref 0.61–1.24)
GFR, Estimated: >60 mL/min (ref >60)
Glucose, Bld: 95 mg/dL (ref 70–99)
Potassium: 4.5 mmol/L (ref 3.5–5.1)
Sodium: 140 mmol/L (ref 135–145)

## 2023-07-19 LAB — CBC WITH DIFFERENTIAL/PLATELET
Abs Immature Granulocytes: 0.02 10*3/uL (ref 0.00–0.07)
Basophils Absolute: 0 10*3/uL (ref 0.0–0.1)
Basophils Relative: 1 %
Eosinophils Absolute: 0.3 10*3/uL (ref 0.0–0.5)
Eosinophils Relative: 6 %
HCT: 41.2 % (ref 39.0–52.0)
Hemoglobin: 13 g/dL (ref 13.0–17.0)
Immature Granulocytes: 0 %
Lymphocytes Relative: 22 %
Lymphs Abs: 1.3 10*3/uL (ref 0.7–4.0)
MCH: 29.5 pg (ref 26.0–34.0)
MCHC: 31.6 g/dL (ref 30.0–36.0)
MCV: 93.4 fL (ref 80.0–100.0)
Monocytes Absolute: 0.5 10*3/uL (ref 0.1–1.0)
Monocytes Relative: 8 %
Neutro Abs: 3.7 10*3/uL (ref 1.7–7.7)
Neutrophils Relative %: 63 %
Platelets: 111 10*3/uL — ABNORMAL LOW (ref 150–400)
RBC: 4.41 MIL/uL (ref 4.22–5.81)
RDW: 14.4 % (ref 11.5–15.5)
WBC: 5.9 10*3/uL (ref 4.0–10.5)
nRBC: 0 % (ref 0.0–0.2)

## 2023-07-19 LAB — SEDIMENTATION RATE: Sed Rate: 11 mm/h (ref 0–16)

## 2023-07-19 MED ORDER — VANCOMYCIN HCL 2000 MG/400ML IV SOLN
2000.0000 mg | Freq: Once | INTRAVENOUS | Status: AC
  Administered 2023-07-19: 2000 mg via INTRAVENOUS
  Filled 2023-07-19: qty 400

## 2023-07-19 MED ORDER — FINASTERIDE 5 MG PO TABS
5.0000 mg | ORAL_TABLET | Freq: Every evening | ORAL | Status: DC
  Administered 2023-07-19: 5 mg via ORAL
  Filled 2023-07-19: qty 1

## 2023-07-19 MED ORDER — HYDRALAZINE HCL 20 MG/ML IJ SOLN: 5.0000 mg | INTRAMUSCULAR | Status: DC | PRN

## 2023-07-19 MED ORDER — BRIMONIDINE TARTRATE 0.2 % OP SOLN
1.0000 [drp] | Freq: Three times a day (TID) | OPHTHALMIC | Status: DC
  Filled 2023-07-19: qty 5

## 2023-07-19 MED ORDER — TAMSULOSIN HCL 0.4 MG PO CAPS
0.4000 mg | ORAL_CAPSULE | Freq: Every day | ORAL | Status: DC
  Administered 2023-07-19: 0.4 mg via ORAL
  Filled 2023-07-19: qty 1

## 2023-07-19 MED ORDER — SODIUM CHLORIDE 0.9 % IV SOLN
1.0000 g | Freq: Once | INTRAVENOUS | Status: AC
  Administered 2023-07-19: 1 g via INTRAVENOUS
  Filled 2023-07-19: qty 10

## 2023-07-19 MED ORDER — VANCOMYCIN HCL 1250 MG/250ML IV SOLN: 1250.0000 mg | INTRAVENOUS | Status: DC

## 2023-07-19 MED ORDER — FUROSEMIDE 40 MG PO TABS
40.0000 mg | ORAL_TABLET | Freq: Every day | ORAL | Status: DC
  Administered 2023-07-20: 40 mg via ORAL
  Filled 2023-07-19: qty 1

## 2023-07-19 MED ORDER — DOXYCYCLINE HYCLATE 100 MG PO TABS
100.0000 mg | ORAL_TABLET | Freq: Two times a day (BID) | ORAL | Status: DC
  Administered 2023-07-19 – 2023-07-20 (×2): 100 mg via ORAL
  Filled 2023-07-19 (×2): qty 1

## 2023-07-19 MED ORDER — BACITRACIN ZINC 500 UNIT/GM EX OINT
TOPICAL_OINTMENT | Freq: Two times a day (BID) | CUTANEOUS | Status: DC
  Administered 2023-07-19 – 2023-07-20 (×2): 1 via TOPICAL
  Filled 2023-07-19 (×2): qty 0.9

## 2023-07-19 MED ORDER — SIMVASTATIN 10 MG PO TABS
40.0000 mg | ORAL_TABLET | Freq: Every day | ORAL | Status: DC
  Administered 2023-07-19: 40 mg via ORAL
  Filled 2023-07-19: qty 4

## 2023-07-19 MED ORDER — ASPIRIN 81 MG PO TBEC
81.0000 mg | DELAYED_RELEASE_TABLET | Freq: Every day | ORAL | Status: DC
  Administered 2023-07-20: 81 mg via ORAL
  Filled 2023-07-19: qty 1

## 2023-07-19 MED ORDER — METHYLPREDNISOLONE SODIUM SUCC 125 MG IJ SOLR
125.0000 mg | Freq: Once | INTRAMUSCULAR | Status: AC
  Administered 2023-07-19: 125 mg via INTRAVENOUS
  Filled 2023-07-19: qty 2

## 2023-07-19 MED ORDER — HEPARIN SODIUM (PORCINE) 5000 UNIT/ML IJ SOLN
5000.0000 [IU] | Freq: Three times a day (TID) | INTRAMUSCULAR | Status: DC
  Administered 2023-07-19 – 2023-07-20 (×3): 5000 [IU] via SUBCUTANEOUS
  Filled 2023-07-19 (×3): qty 1

## 2023-07-19 MED ORDER — VITAMIN B-12 1000 MCG PO TABS
500.0000 ug | ORAL_TABLET | Freq: Every day | ORAL | Status: DC
  Administered 2023-07-20: 500 ug via ORAL
  Filled 2023-07-19: qty 1

## 2023-07-19 MED ORDER — ACETAMINOPHEN 325 MG PO TABS: 650.0000 mg | ORAL_TABLET | Freq: Four times a day (QID) | ORAL | Status: DC | PRN

## 2023-07-19 MED ORDER — ACETAMINOPHEN 650 MG RE SUPP: 650.0000 mg | Freq: Four times a day (QID) | RECTAL | Status: DC | PRN

## 2023-07-19 MED ORDER — DOCUSATE SODIUM 100 MG PO CAPS
100.0000 mg | ORAL_CAPSULE | Freq: Every day | ORAL | Status: DC
  Administered 2023-07-20: 100 mg via ORAL
  Filled 2023-07-19: qty 1

## 2023-07-19 MED ORDER — FUROSEMIDE 10 MG/ML IJ SOLN
20.0000 mg | Freq: Once | INTRAMUSCULAR | Status: AC
  Administered 2023-07-19: 20 mg via INTRAVENOUS
  Filled 2023-07-19: qty 2

## 2023-07-19 MED ORDER — ALPRAZOLAM 0.5 MG PO TABS
0.5000 mg | ORAL_TABLET | Freq: Every day | ORAL | Status: DC
  Administered 2023-07-19: 0.5 mg via ORAL
  Filled 2023-07-19: qty 1

## 2023-07-19 MED ORDER — ALBUTEROL SULFATE (2.5 MG/3ML) 0.083% IN NEBU: 2.5000 mg | INHALATION_SOLUTION | RESPIRATORY_TRACT | Status: DC | PRN

## 2023-07-19 MED ORDER — POTASSIUM CHLORIDE CRYS ER 10 MEQ PO TBCR
10.0000 meq | EXTENDED_RELEASE_TABLET | Freq: Every day | ORAL | Status: DC
  Administered 2023-07-20: 10 meq via ORAL
  Filled 2023-07-19: qty 1

## 2023-07-19 MED ORDER — METOPROLOL TARTRATE 25 MG PO TABS
12.5000 mg | ORAL_TABLET | Freq: Two times a day (BID) | ORAL | Status: DC
Start: 2023-07-19 — End: 2023-07-20
  Administered 2023-07-19 – 2023-07-20 (×2): 12.5 mg via ORAL
  Filled 2023-07-19 (×2): qty 1

## 2023-07-19 MED ORDER — ACETAMINOPHEN 325 MG PO TABS
650.0000 mg | ORAL_TABLET | Freq: Once | ORAL | Status: AC
  Administered 2023-07-19: 650 mg via ORAL
  Filled 2023-07-19: qty 2

## 2023-07-19 MED ORDER — CELECOXIB 100 MG PO CAPS
200.0000 mg | ORAL_CAPSULE | Freq: Two times a day (BID) | ORAL | Status: DC
  Administered 2023-07-19 – 2023-07-20 (×2): 200 mg via ORAL
  Filled 2023-07-19 (×2): qty 2

## 2023-07-19 MED ORDER — DOUBLE ANTIBIOTIC 500-10000 UNIT/GM EX OINT
1.0000 | TOPICAL_OINTMENT | Freq: Two times a day (BID) | CUTANEOUS | Status: DC
  Administered 2023-07-19 – 2023-07-20 (×2): 1 via TOPICAL
  Filled 2023-07-19 (×2): qty 1

## 2023-07-19 NOTE — Consult Note (Signed)
Pharmacy Antibiotic Note  Paul Lam is a 83 y.o. male admitted on 07/19/2023 with cellulitis.  Pharmacy has been consulted for Vancomycin dosing.  Plan: Vancomycin 2000 mg IV LD x 1, then Vancomycin 1250mg  Q 24 hrs.  Goal AUC 400-550.  Expected AUC: 488.4  Expected Cmin: 10.8  SCr used: 0.88   Height: 5\' 6"  (167.6 cm) Weight: 95.3 kg (210 lb 1.6 oz) IBW/kg (Calculated) : 63.8  Temp (24hrs), Avg:98.8 F (37.1 C), Min:98.8 F (37.1 C), Max:98.8 F (37.1 C)  Recent Labs  Lab 07/15/23 0928 07/19/23 1729  WBC 6.1 5.9  CREATININE 0.92 0.88    Estimated Creatinine Clearance: 71.1 mL/min (by C-G formula based on SCr of 0.88 mg/dL).    No Known Allergies  Antimicrobials this admission: Vancomycin  2/13 >>  ceftriaxone 2/13 >>   Dose adjustments this admission: n/a  Microbiology results:  Thank you for allowing pharmacy to be a part of this patient's care.  Kathia Covington Rodriguez-Guzman PharmD, BCPS 07/19/2023 8:12 PM

## 2023-07-19 NOTE — ED Triage Notes (Signed)
Pt arrived via POV from his doctors office for same complaint Pt was recently seen for. Pt reports seeing his PCP this afternoon and was advised to return to the ER for admission. Pts reports recent antibiotics have not helped. Pt reports swelling has only worsened.

## 2023-07-19 NOTE — H&P (Signed)
TRH H&P   Patient Demographics:    Paul Lam, is a 83 y.o. male  MRN: [id number]   DOB - Aug 27, 1940  Admit Date - 07/19/2023  Outpatient Primary MD for the patient is Assunta Found, MD  Referring MD/NP/PA: Dr Durwin Nora  Patient coming from: PCP office  Chief Complaint  Patient presents with   Leg Swelling      HPI:    Paul Lam  is a 83 y.o. male,of nonobstructive CAD, A. fib, on any anticoagulation anymore in the setting of GI bleed in the past with history of Watchman procedure 2022, nephrolithiasis, hyperlipidemia, hypertension, OSA, chronic diastolic CHF. -Presents to ED secondary to leg swelling, pain and erythema, patient reports symptoms has been going on for last 3 weeks, he was seen by urgent care 8 days ago, for which his venous Dopplers was negative for DVT, there was a patient for vasculitis, he was given Keflex, went to PCP today, he instructed patient to come to ED for IV antibiotics, patient himself denies fever, chills. -In ED patient afebrile, no leukocytosis, started empirically on IV antibiotics, and [facility] hospital[facility] consulted to admit.    Review of systems:     A full 10 point Review of Systems was done, except as stated above, all other Review of Systems were negative.   With Past History of the following :    Past Medical History:  Diagnosis Date   Anginal pain (HCC)    Anxiety    Arthritis    generalized arthritis    Atrial fibrillation (HCC)    CAD (coronary artery disease)    Cancer (HCC)    skin cancer removed left ear     Complication of anesthesia    Depression    Headache    History of echocardiogram 02/19/2009   EF 50-55%; LA mild-mod dilated;    History of kidney stones    History of nuclear stress test 02/19/2009   low risk, normal    Hyperlipidemia    Hypertension    Obesity (BMI 30-39.9)    OSA (obstructive sleep apnea)     severe with AHI 25/hr now on BiPAP   PONV (postoperative nausea and vomiting)    Retinal detachment    with proliferative vitreoretinopathy   Shortness of breath    related to weight    Sleep apnea    Vertigo       Past Surgical History:  Procedure Laterality Date   APPENDECTOMY     BIOPSY  03/05/2020   Procedure: BIOPSY;  Surgeon: Malissa Hippo, MD;  Location: [facility];  Service: Endoscopy;;   CARDIAC CATHETERIZATION  07/13/2005   noncritical CAD   CARDIOVERSION N/A 01/29/2018   Procedure: CARDIOVERSION;  Surgeon: Lars Masson, MD;  Location: [facility] ENDOSCOPY;  Service: Cardiovascular;  Laterality: N/A;   COLONOSCOPY WITH PROPOFOL N/A 03/06/2020   Procedure: COLONOSCOPY WITH PROPOFOL;  Surgeon: Marguerita Merles, Reuel Boom, MD;  Location: [facility];  Service: Gastroenterology;  Laterality: N/A;   ESOPHAGOGASTRODUODENOSCOPY (EGD) WITH PROPOFOL N/A 03/05/2020   Procedure: ESOPHAGOGASTRODUODENOSCOPY (EGD) WITH PROPOFOL;  Surgeon: Malissa Hippo, MD;  Location: [facility];  Service: Endoscopy;  Laterality: N/A;   EYE SURGERY     FLEXIBLE SIGMOIDOSCOPY N/A 07/14/2015   Procedure: FLEXIBLE SIGMOIDOSCOPY;  Surgeon: Franky Macho, MD;  Location: [facility];  Service: Gastroenterology;  Laterality: N/A;   GIVENS CAPSULE STUDY N/A 04/15/2020   Procedure: GIVENS CAPSULE STUDY;  Surgeon: Dolores Frame, MD;  Location: [facility];  Service: Gastroenterology;  Laterality: N/A;  730   JOINT REPLACEMENT     LEFT ATRIAL APPENDAGE OCCLUSION N/A 07/09/2020   Procedure: LEFT ATRIAL APPENDAGE OCCLUSION;  Surgeon: Tonny Bollman, MD;  Location: [facility] INVASIVE CV LAB;  Service: Cardiovascular;  Laterality: N/A;   NASAL SEPTOPLASTY W/ TURBINOPLASTY Bilateral 12/19/2017   Procedure: NASAL SEPTOPLASTY WITH TURBINATE REDUCTION;  Surgeon: Newman Pies, MD;  Location: [facility];  Service: ENT;  Laterality: Bilateral;   NASAL SINUS SURGERY     PARS PLANA VITRECTOMY Left 06/20/2017    Procedure: PARS PLANA VITRECTOMY WITH 25 GAUGE, REPAIR OF COMPLEX TRACTION RETINAL DETACHMENT, MEMBRANE PEEEL;  Surgeon: Sherrie George, MD;  Location: [facility] OR;  Service: Ophthalmology;  Laterality: Left;   POLYPECTOMY  03/06/2020   Procedure: POLYPECTOMY;  Surgeon: Dolores Frame, MD;  [facility]: [facility];  Service: Gastroenterology;;   REPAIR OF COMPLEX TRACTION RETINAL DETACHMENT Left 06/20/2017   RETINAL DETACHMENT SURGERY Right 04/2011   SCLERAL BUCKLE WITH POSSIBLE 25 GAUGE PARS PLANA VITRECTOMY Left 05/22/2017   Procedure: SCLERAL BUCKLE, LASER, GAS INJECTION LEFT EYE;  Surgeon: Sherrie George, MD;  Location: [facility] OR;  Service: Ophthalmology;  Laterality: Left;   SKIN CANCER EXCISION Left 2013   left ear skin ca removed  2 weeks ago - not completeely healed 08/15/11    TEE WITHOUT CARDIOVERSION N/A 07/09/2020   Procedure: TRANSESOPHAGEAL ECHOCARDIOGRAM (TEE);  Surgeon: Tonny Bollman, MD;  Location: [facility] INVASIVE CV LAB;  Service: Cardiovascular;  Laterality: N/A;   TEE WITHOUT CARDIOVERSION N/A 08/31/2020   Procedure: TRANSESOPHAGEAL ECHOCARDIOGRAM (TEE);  Surgeon: Sande Rives, MD;  Location: [facility] ENDOSCOPY;  Service: Cardiovascular;  Laterality: N/A;   TONSILLECTOMY     TOTAL KNEE ARTHROPLASTY  08/22/2011   Procedure: TOTAL KNEE ARTHROPLASTY;  Surgeon: Loanne Drilling, MD;  Location: [facility] ORS;  Service: Orthopedics;  Laterality: Right;      Social History:     Social History   Tobacco Use   Smoking status: Never   Smokeless tobacco: Never  Substance Use Topics   Alcohol use: No        Family History :     Family History  Problem Relation Age of Onset   Heart failure Mother        also arthritis   Stroke Father        also emphysema   Heart failure Father     Home Medications:   Prior to Admission medications   Medication Sig Start Date End Date Taking? Authorizing Provider  ALPRAZolam Prudy Feeler) 0.5 MG tablet Take 0.5 mg at bedtime by mouth.  Anxiety     [provider]  Ascorbic Acid (VITAMIN C) 1000 MG tablet Take 1,000 mg by mouth daily.    [provider]  aspirin EC 81 MG tablet Take 1 tablet (81 mg total) by mouth daily. Swallow whole.  01/04/21   Tonny Bollman, MD  bacitracin-polymyxin b (POLYSPORIN) ointment Apply 1 Application topically 2 (two) times daily.    [provider]  brimonidine (ALPHAGAN) 0.2 % ophthalmic solution Place 1 drop into the left eye in the morning and at bedtime. 05/20/21   [provider]  Calcium Polycarbophil (FIBER-CAPS PO) Take 1 capsule by mouth daily.    [provider]  carboxymethylcellulose (REFRESH PLUS) 0.5 % SOLN Place 1 drop into the right eye daily as needed (dry eyes).    [provider]  cefadroxil (DURICEF) 500 MG capsule Take 1 capsule (500 mg total) by mouth 2 (two) times daily. 06/27/23   Peter Garter, PA  celecoxib (CELEBREX) 200 MG capsule Take 200 mg by mouth 2 (two) times daily. 10/26/22   [provider]  cephALEXin (KEFLEX) 500 MG capsule Take 1 capsule (500 mg total) by mouth 4 (four) times daily. 07/15/23   Loetta Rough, MD  Cholecalciferol (VITAMIN D3) 50 MCG (2000 UT) TABS Take 4,000 Units by mouth daily.    [provider]  docusate sodium (COLACE) 100 MG capsule Take 100 mg by mouth 2 (two) times daily.    [provider]  finasteride (PROSCAR) 5 MG tablet Take 1 tablet (5 mg total) by mouth daily. 08/19/22   McKenzie, Mardene Celeste, MD  furosemide (LASIX) 40 MG tablet Take 1 tablet (40 mg total) by mouth daily. 01/04/23   Hilty, Lisette Abu, MD  metoprolol tartrate (LOPRESSOR) 25 MG tablet Take 0.5 tablets (12.5 mg total) by mouth 2 (two) times daily. 06/10/20   Fenton, Clint R, PA  Misc Natural Products (TART CHERRY ADVANCED PO) Take 1 tablet by mouth daily.     [provider]  NON FORMULARY at bedtime. CPAP    [provider]  potassium chloride (KLOR-CON) 10 MEQ tablet Take 10  mEq by mouth daily. 08/06/21   [provider]  simvastatin (ZOCOR) 40 MG tablet Take 40 mg by mouth at bedtime.    [provider]  tamsulosin (FLOMAX) 0.4 MG CAPS capsule Take 1 capsule (0.4 mg total) by mouth in the morning and at bedtime. 08/19/22   McKenzie, Mardene Celeste, MD  Zinc 50 MG TABS Take 50 mg by mouth daily.    [provider]     Allergies:    No Known Allergies   Physical Exam:   Vitals  Blood pressure 132/76, pulse 75, temperature 98.8 F (37.1 C), temperature source Oral, resp. rate 18, height 5\' 6"  (1.676 m), weight 95.3 kg, SpO2 97%.   1. General *elderly male, laying in bed in no apparent distress, anxious  2. Normal affect and insight, Not Suicidal or Homicidal, Awake Alert, Oriented X 3.  3. No F.N deficits, ALL C.Nerves Intact, Strength 5/5 all 4 extremities, Sensation intact all 4 extremities, Plantars down going.  4. Ears and Eyes appear Normal, Conjunctivae clear, PERRLA. Moist Oral Mucosa.  5. Supple Neck, No JVD, No cervical lymphadenopathy appriciated, No Carotid Bruits.  6. Symmetrical Chest wall movement, Good air movement bilaterally, CTAB.  7. RRR, No Gallops, Rubs or Murmurs, No Parasternal Heave.  8. Positive Bowel Sounds, Abdomen Soft, No tenderness, No organomegaly appriciated,No rebound -guarding or rigidity.  9.  With chronic lower extremity edema-lymphedema, with petechiae in the right lower extremity, and left great toe nail redness  10. Good muscle tone,  joints appear normal , no effusions, Normal ROM.         Data Review:  CBC Recent Labs  Lab 07/15/23 0928 07/19/23 1729  WBC 6.1 5.9  HGB 12.5* 13.0  HCT 39.3 41.2  PLT 100* 111*  MCV 92.9 93.4  MCH 29.6 29.5  MCHC 31.8 31.6  RDW 14.4 14.4  LYMPHSABS 1.0 1.3  MONOABS 0.5 0.5  EOSABS 0.2 0.3  BASOSABS 0.0 0.0    ------------------------------------------------------------------------------------------------------------------  Chemistries  Recent Labs  Lab 07/15/23 0928 07/19/23 1729  NA 139 140  K 4.4 4.5  CL 103 102  CO2 29 27  GLUCOSE 103* 95  BUN 19 19  CREATININE 0.92 0.88  CALCIUM 8.6* 8.9   ------------------------------------------------------------------------------------------------------------------ estimated creatinine clearance is 71.1 mL/min (by C-G formula based on SCr of 0.88 mg/dL). ------------------------------------------------------------------------------------------------------------------ No results for input(s): "TSH", "T4TOTAL", "T3FREE", "THYROIDAB" in the last 72 hours.  Invalid input(s): "FREET3"  Coagulation profile Recent Labs  Lab 07/15/23 0928  INR 1.2   ------------------------------------------------------------------------------------------------------------------- No results for input(s): "DDIMER" in the last 72 hours. -------------------------------------------------------------------------------------------------------------------  Cardiac Enzymes No results for input(s): "CKMB", "TROPONINI", "MYOGLOBIN" in the last 168 hours.  Invalid input(s): "CK" ------------------------------------------------------------------------------------------------------------------    Component Value Date/Time   BNP 117.0 (H) 03/03/2020 1606     ---------------------------------------------------------------------------------------------------------------  Urinalysis    Component Value Date/Time   COLORURINE YELLOW 06/26/2018 1045   APPEARANCEUR Clear 08/19/2022 1015   LABSPEC 1.021 06/26/2018 1045   PHURINE 6.0 06/26/2018 1045   GLUCOSEU Negative 08/19/2022 1015   HGBUR SMALL (A) 06/26/2018 1045   BILIRUBINUR Negative 08/19/2022 1015   KETONESUR NEGATIVE 06/26/2018 1045   PROTEINUR Negative 08/19/2022 1015   PROTEINUR NEGATIVE 06/26/2018 1045    UROBILINOGEN 0.2 08/14/2019 1151   UROBILINOGEN 0.2 08/15/2011 1248   NITRITE Negative 08/19/2022 1015   NITRITE POSITIVE (A) 06/26/2018 1045   LEUKOCYTESUR Negative 08/19/2022 1015    ----------------------------------------------------------------------------------------------------------------   Imaging Results:    No results found.     Assessment & Plan:    Principal Problem:   Cellulitis of right lower extremity Active Problems:   Morbid obesity due to excess calories (HCC)   OSA (obstructive sleep apnea)   Hypertension   Atrial fibrillation, chronic (HCC)   Thrombocytopenia (HCC)   Benign localized prostatic hyperplasia with lower urinary tract symptoms (LUTS)   Right lower extremity petechiae. -Patient appears stable, afebrile, no leukocytosis, clinically does not appear to be warm or tender to palpation, no clinical suspicion for cellulitis, so we will DC IV vancomycin and Rocephin for now, will keep on doxycycline. -Possible vasculitis, will obtain CRP, ESR, C3, C4, and procalcitonin as well, he received IV steroids in ED. -Discussed with the patient and wife,  will observe over next 24 hours, as long he remains afebrile with no leukocytosis, likely can discharge home on doxycycline where he will need to follow-up with rheumatology as an outpatient.  Chronic diastolic CHF -He appears to be euvolemic, he has chronic lower extremity edema, continue with home dose Lasix  Atrial fibrillation status post Watchman procedure -Continue with metoprolol and aspirin  Hypertension -Continue with metoprolol  BPH -Continue with Proscar and Flomax   anxiety  -continue with Xanax   DVT Prophylaxis Heparin   AM Labs Ordered, also please review Full Orders  Family Communication: Admission, patients condition and plan of care including tests being ordered have been discussed with the patient and wife Liborio Nixon by phone* who indicate understanding and agree with the plan and  Code Status.  Code Status full code  Likely DC to home   Consults called: None  Admission status: Observation  Time spent in minutes : 60 minutes   Huey Bienenstock M.D on 07/19/2023 at 9:36 PM   [facility] - Office  [phone]

## 2023-07-19 NOTE — ED Triage Notes (Signed)
Pt presents paperwork from his PCP Office advising the Pt receive admission and IV Antibiotics.

## 2023-07-19 NOTE — ED Provider Notes (Signed)
[place] EMERGENCY DEPARTMENT AT [facility] Provider Note   CSN: [id number] Arrival date & time: 07/19/23  1441     History  Chief Complaint  Patient presents with   Leg Swelling    Paul Lam is a 83 y.o. male.  HPI Patient presents for leg swelling and redness.  Medical history includes arthritis, anxiety, HTN, BPH, anemia, CHF, atrial fibrillation.  He has had ongoing redness and swelling to right leg for the past 2 weeks.  He went to urgent care 8 days ago and was told he has a vasculitis.  He was seen in the ED 4 days ago.  He had a negative DVT study at that time.  He was prescribed Keflex.  Symptoms have worsened.  He followed up with his PCP today.  PCP advised him to come to the ED for IV antibiotics and admission.  Patient endorses pain in area of swelling and redness.  He feels that the affected area has been spreading up his leg.  He denies any fevers or chills.    Home Medications Prior to Admission medications   Medication Sig Start Date End Date Taking? Authorizing Provider  ALPRAZolam Prudy Feeler) 0.5 MG tablet Take 0.5 mg at bedtime by mouth. Anxiety     [provider]  Ascorbic Acid (VITAMIN C) 1000 MG tablet Take 1,000 mg by mouth daily.    [provider]  aspirin EC 81 MG tablet Take 1 tablet (81 mg total) by mouth daily. Swallow whole. 01/04/21   Tonny Bollman, MD  bacitracin-polymyxin b (POLYSPORIN) ointment Apply 1 Application topically 2 (two) times daily.    [provider]  brimonidine (ALPHAGAN) 0.2 % ophthalmic solution Place 1 drop into the left eye in the morning and at bedtime. 05/20/21   [provider]  Calcium Polycarbophil (FIBER-CAPS PO) Take 1 capsule by mouth daily.    [provider]  carboxymethylcellulose (REFRESH PLUS) 0.5 % SOLN Place 1 drop into the right eye daily as needed (dry eyes).    [provider]  cefadroxil (DURICEF) 500 MG capsule Take 1 capsule (500 mg total) by  mouth 2 (two) times daily. 06/27/23   Peter Garter, PA  celecoxib (CELEBREX) 200 MG capsule Take 200 mg by mouth 2 (two) times daily. 10/26/22   [provider]  cephALEXin (KEFLEX) 500 MG capsule Take 1 capsule (500 mg total) by mouth 4 (four) times daily. 07/15/23   Loetta Rough, MD  Cholecalciferol (VITAMIN D3) 50 MCG (2000 UT) TABS Take 4,000 Units by mouth daily.    [provider]  docusate sodium (COLACE) 100 MG capsule Take 100 mg by mouth 2 (two) times daily.    [provider]  finasteride (PROSCAR) 5 MG tablet Take 1 tablet (5 mg total) by mouth daily. 08/19/22   McKenzie, Mardene Celeste, MD  furosemide (LASIX) 40 MG tablet Take 1 tablet (40 mg total) by mouth daily. 01/04/23   Hilty, Lisette Abu, MD  metoprolol tartrate (LOPRESSOR) 25 MG tablet Take 0.5 tablets (12.5 mg total) by mouth 2 (two) times daily. 06/10/20   Fenton, Clint R, PA  Misc Natural Products (TART CHERRY ADVANCED PO) Take 1 tablet by mouth daily.     [provider]  NON FORMULARY at bedtime. CPAP    [provider]  potassium chloride (KLOR-CON) 10 MEQ tablet Take 10 mEq by mouth daily. 08/06/21   [provider]  simvastatin (ZOCOR) 40 MG tablet Take 40 mg by  mouth at bedtime.    [provider]  tamsulosin (FLOMAX) 0.4 MG CAPS capsule Take 1 capsule (0.4 mg total) by mouth in the morning and at bedtime. 08/19/22   McKenzie, Mardene Celeste, MD  Zinc 50 MG TABS Take 50 mg by mouth daily.    [provider]      Allergies    Patient has no known allergies.    Review of Systems   Review of Systems  Cardiovascular:  Positive for leg swelling.  Skin:  Positive for color change.  All other systems reviewed and are negative.   Physical Exam Updated Vital Signs BP 137/75   Pulse 63   Temp 98.8 F (37.1 C) (Oral)   Resp 18   Ht 5\' 6"  (1.676 m)   Wt 95.3 kg   SpO2 95%   BMI 33.91 kg/m  Physical Exam Vitals and nursing note reviewed.  Constitutional:       General: He is not in acute distress.    Appearance: Normal appearance. He is well-developed. He is not ill-appearing, toxic-appearing or diaphoretic.  HENT:     Head: Normocephalic and atraumatic.     Right Ear: External ear normal.     Left Ear: External ear normal.     Nose: Nose normal.     Mouth/Throat:     Mouth: Mucous membranes are moist.  Eyes:     Extraocular Movements: Extraocular movements intact.     Conjunctiva/sclera: Conjunctivae normal.  Cardiovascular:     Rate and Rhythm: Normal rate and regular rhythm.  Pulmonary:     Effort: Pulmonary effort is normal. No respiratory distress.  Abdominal:     General: There is no distension.     Palpations: Abdomen is soft.     Tenderness: There is no abdominal tenderness.  Musculoskeletal:        General: No swelling.     Cervical back: Normal range of motion.     Right lower leg: Edema present.     Left lower leg: Edema present.  Skin:    General: Skin is warm and dry.     Findings: Erythema present.  Neurological:     General: No focal deficit present.     Mental Status: He is alert and oriented to person, place, and time.  Psychiatric:        Mood and Affect: Mood normal.        Behavior: Behavior normal.     ED Results / Procedures / Treatments   Labs (all labs ordered are listed, but only abnormal results are displayed) Labs Reviewed  CBC WITH DIFFERENTIAL/PLATELET - Abnormal; Notable for the following components:      Result Value   Platelets 111 (*)    All other components within normal limits  BASIC METABOLIC PANEL  SEDIMENTATION RATE  C-REACTIVE PROTEIN    EKG None  Radiology No results found.  Procedures Procedures    Medications Ordered in ED Medications  acetaminophen (TYLENOL) tablet 650 mg (has no administration in time range)  cefTRIAXone (ROCEPHIN) 1 g in sodium chloride 0.9 % 100 mL IVPB (has no administration in time range)  methylPREDNISolone sodium succinate (SOLU-MEDROL)  125 mg/2 mL injection 125 mg (has no administration in time range)  furosemide (LASIX) injection 20 mg (has no administration in time range)    ED Course/ Medical Decision Making/ A&P  Medical Decision Making  This patient presents to the ED for concern of leg swelling, pain, and redness, this involves an extensive number of treatment options, and is a complaint that carries with it a high risk of complications and morbidity.  The differential diagnosis includes cellulitis, DVT, venous insufficiency, vasculitis   Co morbidities that complicate the patient evaluation  arthritis, anxiety, HTN, BPH, anemia, CHF, atrial fibrillation   Additional history obtained:  Additional history obtained from N/A External records from outside source obtained and reviewed including EMR   Lab Tests:  I Ordered, and personally interpreted labs.  The pertinent results include: Hemoglobin, no leukocytosis, normal kidney function, normal electrolytes  Cardiac Monitoring: / EKG:  The patient was maintained on a cardiac monitor.  I personally viewed and interpreted the cardiac monitored which showed an underlying rhythm of: Sinus rhythm   Problem List / ED Course / Critical interventions / Medication management  Patient presents for worsening leg swelling and redness.  On arrival in the ED, vital signs are normal.  Initial lab work was obtained prior to patient being bedded in the ED.  Results are reassuring with normal hemoglobin, no leukocytosis, normal electrolytes.  On exam, patient is overall well-appearing.  He does have significant swelling to bilateral lower extremities.  On his right lower extremity, he has areas of skin change.  Area is warm to the touch and tender.  Although there does appear to be some surrounding petechiae, I am concerned of anterior cellulitis.  He has been on Keflex with no improvement.  IV antibiotics were ordered.  Given concern of possible  vasculitis, patient also was given dose of steroids.  Lasix was ordered for diuresis.  Patient to be admitted for further management. I ordered medication including ceftriaxone and vancomycin for empiric treatment of cellulitis; Lasix diuresis; Solu-Medrol for possible inflammatory vasculitis. Reevaluation of the patient after these medicines showed that the patient stayed the same I have reviewed the patients home medicines and have made adjustments as needed   Social Determinants of Health:  Has access to outpatient care        Final Clinical Impression(s) / ED Diagnoses Final diagnoses:  Cellulitis of right lower extremity  Failure of outpatient treatment    Rx / DC Orders ED Discharge Orders     None         Gloris Manchester, MD 07/19/23 2008

## 2023-07-19 NOTE — ED Provider Triage Note (Signed)
Emergency Medicine Provider Triage Evaluation Note  Paul Lam , a 83 y.o. male  was evaluated in triage.  Pt complains of right leg redness and swelling.  Patient was diagnosed with vasculitis after a fall back in January has been seen multiple times for this.  Last time patient was seen he had an ultrasound was ultimately negative and discharged on Keflex however went to go to his primary care provider and was told he needs to be admitted for IV antibiotics.  Patient denies any fevers.  Review of Systems  Positive:  Negative:   Physical Exam  BP (!) 148/60 (BP Location: Right Arm)   Pulse (!) 49   Temp 98.8 F (37.1 C) (Oral)   Resp 16   Ht 5\' 6"  (1.676 m)   Wt 95.3 kg   SpO2 99%   BMI 33.91 kg/m  Gen:   Awake, no distress   Resp:  Normal effort  MSK:   Moves extremities without difficulty  Other:  Erythema and tenderness to right lower extremity, legs do appear about the same size, no signs of purulent drainage  Medical Decision Making  Medically screening exam initiated at 4:31 PM.  Appropriate orders placed.  Theodore Virgin Dambrosia was informed that the remainder of the evaluation will be completed by another provider, this initial triage assessment does not replace that evaluation, and the importance of remaining in the ED until their evaluation is complete.  Workup initiated, patient stable at this time   Remi Deter 07/19/23 7846

## 2023-07-20 DIAGNOSIS — L03115 Cellulitis of right lower limb: Secondary | ICD-10-CM | POA: Diagnosis not present

## 2023-07-20 DIAGNOSIS — I776 Arteritis, unspecified: Secondary | ICD-10-CM

## 2023-07-20 LAB — BASIC METABOLIC PANEL
Anion gap: 7 (ref 5–15)
BUN: 19 mg/dL (ref 8–23)
CO2: 26 mmol/L (ref 22–32)
Calcium: 8.5 mg/dL — ABNORMAL LOW (ref 8.9–10.3)
Chloride: 105 mmol/L (ref 98–111)
Creatinine, Ser: 0.9 mg/dL (ref 0.61–1.24)
GFR, Estimated: >60 mL/min (ref >60)
Glucose, Bld: 149 mg/dL — ABNORMAL HIGH (ref 70–99)
Potassium: 4.4 mmol/L (ref 3.5–5.1)
Sodium: 138 mmol/L (ref 135–145)

## 2023-07-20 LAB — CBC
HCT: 38 % — ABNORMAL LOW (ref 39.0–52.0)
Hemoglobin: 12.5 g/dL — ABNORMAL LOW (ref 13.0–17.0)
MCH: 29.3 pg (ref 26.0–34.0)
MCHC: 32.9 g/dL (ref 30.0–36.0)
MCV: 89.2 fL (ref 80.0–100.0)
Platelets: 102 10*3/uL — ABNORMAL LOW (ref 150–400)
RBC: 4.26 MIL/uL (ref 4.22–5.81)
RDW: 14.4 % (ref 11.5–15.5)
WBC: 5.8 10*3/uL (ref 4.0–10.5)
nRBC: 0 % (ref 0.0–0.2)

## 2023-07-20 LAB — C-REACTIVE PROTEIN: CRP: 0.5 mg/dL (ref <1.0)

## 2023-07-20 MED ORDER — PREDNISONE 20 MG PO TABS: ORAL_TABLET | ORAL | 0 refills | Status: AC

## 2023-07-20 MED ORDER — FUROSEMIDE 10 MG/ML IJ SOLN
40.0000 mg | Freq: Once | INTRAMUSCULAR | Status: AC
  Administered 2023-07-20: 40 mg via INTRAVENOUS
  Filled 2023-07-20: qty 4

## 2023-07-20 NOTE — Plan of Care (Signed)
  Problem: Acute Rehab PT Goals(only PT should resolve) Goal: Pt Will Go Supine/Side To Sit Outcome: Progressing Flowsheets (Taken 07/20/2023 1214) Pt will go Supine/Side to Sit: with contact guard assist Goal: Patient Will Transfer Sit To/From Stand Outcome: Progressing Flowsheets (Taken 07/20/2023 1214) Patient will transfer sit to/from stand: with contact guard assist Goal: Pt Will Transfer Bed To Chair/Chair To Bed Outcome: Progressing Flowsheets (Taken 07/20/2023 1214) Pt will Transfer Bed to Chair/Chair to Bed: with contact guard assist Goal: Pt Will Ambulate Outcome: Progressing Flowsheets (Taken 07/20/2023 1214) Pt will Ambulate:  75 feet  with contact guard assist  with rolling walker   Paul Lam SPT

## 2023-07-20 NOTE — TOC Transition Note (Signed)
Transition of Care [facility]) - Discharge Note   Patient Details  Name: Paul Lam MRN: [id number] Date of Birth: April 18, 1941  Transition of Care [facility]) CM/SW Contact:  Isabella Bowens, LCSWA Phone Number: 07/20/2023, 12:40 PM   Clinical Narrative:    CSW spoke with spouse who is at bedside with patient. CSW assessed patient since PT recommended HHPT. Patient and spouse declined having someone come out to the home because patient is already established with [facility] in [place] for his hip and leg. Spouse stated that patient appointment today was canceled and his next appointment will be Tuesday 07/25/23, so they will stick with them. No other TOC needs needed. TOC signing off since patient is projected to leave later on today.     Final next level of care: OP Rehab (Patient is already connected with OP PT with [facility] in [place]) Barriers to Discharge: Barriers Resolved   Patient Goals and CMS Choice Patient states their goals for this hospitalization and ongoing recovery are:: return back home CMS Medicare.gov Compare Post Acute Care list provided to:: Patient Represenative (must comment) (Spouse- Liborio Nixon) Choice offered to / list presented to : Spouse      Discharge Placement                Patient to be transferred to facility by: Spouse will provide transportation Name of family member notified: Liborio Nixon - Spouse Patient and family notified of of transfer: 07/20/23  Discharge Plan and Services Additional resources added to the After Visit Summary for                  DME Arranged: N/A DME Agency: NA [facility] [place])       HH Arranged: Refused HH (OP PT - already open with [facility]) HH Agency: Other - See comment [facility] [place])        Social Drivers of Health (SDOH) Interventions SDOH Screenings   Housing: Low Risk  (02/16/2022)  Transportation Needs: No Transportation  Needs (02/16/2022)  Utilities: Not At Risk (02/16/2022)  Financial Resource Strain: Low Risk  (02/16/2022)  Tobacco Use: Low Risk  (07/19/2023)     Readmission Risk Interventions     No data to display

## 2023-07-20 NOTE — Plan of Care (Signed)
  Problem: Acute Rehab OT Goals (only OT should resolve) Goal: Pt. Will Perform Grooming Flowsheets (Taken 07/20/2023 1045) Pt Will Perform Grooming:  with modified independence  standing Goal: Pt. Will Perform Lower Body Dressing Flowsheets (Taken 07/20/2023 1045) Pt Will Perform Lower Body Dressing:  with modified independence  sitting/lateral leans  with adaptive equipment Goal: Pt. Will Transfer To Toilet Flowsheets (Taken 07/20/2023 1045) Pt Will Transfer to Toilet:  with modified independence  ambulating Goal: Pt. Will Perform Toileting-Clothing Manipulation Flowsheets (Taken 07/20/2023 1045) Pt Will Perform Toileting - Clothing Manipulation and hygiene: with modified independence  Kortne All OT, MOT

## 2023-07-20 NOTE — Evaluation (Signed)
Occupational Therapy Evaluation Patient Details Name: Paul Lam MRN: [id number] DOB: [birth year] Today's Date: 07/20/2023   History of Present Illness   Rose Hippler  is a 83 y.o. male,of nonobstructive CAD, A. fib, on any anticoagulation anymore in the setting of GI bleed in the past with history of Watchman procedure 2022, nephrolithiasis, hyperlipidemia, hypertension, OSA, chronic diastolic CHF.  -Presents to ED secondary to leg swelling, pain and erythema, patient reports symptoms has been going on for last 3 weeks, he was seen by urgent care 8 days ago, for which his venous Dopplers was negative for DVT, there was a patient for vasculitis, he was given Keflex, went to PCP today, he instructed patient to come to ED for IV antibiotics, patient himself denies fever, chills.  -In ED patient afebrile, no leukocytosis, started empirically on IV antibiotics, and [facility] hospitalist consulted to admit. (per MD)     Clinical Impressions Pt agreeable to OT and PT co-evaluation. Wife present and reporting that she is able to be home 24/7. Pt reports supervision assist for toilet transfers and assist for donning socks at times. Pt able to transfer to chair from the bed with CGA to min A using cane. Pt much more steady with use of RW for ambulation. Good B UE strength. Good ability to complete upper body seated ADL's. Pt left in the chair with call bell within reach and family present. Pt will benefit from continued OT in the hospital and recommended venue below to increase strength, balance, and endurance for safe ADL's.        If plan is discharge home, recommend the following:   A little help with walking and/or transfers;A little help with bathing/dressing/bathroom;Assistance with cooking/housework;Assist for transportation;Help with stairs or ramp for entrance     Functional Status Assessment   Patient has had a recent decline in their functional status and demonstrates the ability to make  significant improvements in function in a reasonable and predictable amount of time.     Equipment Recommendations   Tub/shower bench (if it will fit in their current set up)     Recommendations for Other Services         Precautions/Restrictions   Precautions Precautions: Fall Recall of Precautions/Restrictions: Intact Restrictions Weight Bearing Restrictions Per Provider Order: No     Mobility Bed Mobility Overal bed mobility: Needs Assistance Bed Mobility: Supine to Sit     Supine to sit: Contact guard, Min assist     General bed mobility comments: labored effort    Transfers Overall transfer level: Needs assistance Equipment used: Straight cane Transfers: Sit to/from Stand, Bed to chair/wheelchair/BSC Sit to Stand: Contact guard assist, Min assist Stand pivot transfers: Contact guard assist, Min assist         General transfer comment: Unsteady for bed to chair transfere without RW and using cane only.      Balance Overall balance assessment: Needs assistance Sitting-balance support: No upper extremity supported, Feet supported Sitting balance-Leahy Scale: Fair Sitting balance - Comments: seated at EOB   Standing balance support: Bilateral upper extremity supported, During functional activity Standing balance-Leahy Scale: Poor Standing balance comment: poor with cane; fair to good with RW                           ADL either performed or assessed with clinical judgement   ADL Overall ADL's : Needs assistance/impaired     Grooming: Set up;Sitting   Upper Body Bathing:  Set up;Sitting;Supervision/ safety   Lower Body Bathing: Minimal assistance;Moderate assistance;Sitting/lateral leans   Upper Body Dressing : Set up;Sitting   Lower Body Dressing: Moderate assistance;Minimal assistance;Sitting/lateral leans Lower Body Dressing Details (indicate cue type and reason): Not attempted; pt uses sock aid at baseline or wife  assists. Toilet Transfer: Contact guard assist;Minimal assistance;Ambulation;Rolling walker (2 wheels) Toilet Transfer Details (indicate cue type and reason): Simulated via EOB to chair and ambulation with RW Toileting- Clothing Manipulation and Hygiene: Contact guard assist;Minimal assistance;Sitting/lateral lean       Functional mobility during ADLs: Rolling walker (2 wheels);Contact guard assist General ADL Comments: Able to ambulate to the end of the hall and back ~over 70 feet with RW.     Vision Baseline Vision/History: 1 Wears glasses;3 Glaucoma Ability to See in Adequate Light: 1 Impaired Patient Visual Report: No change from baseline (history of retina detachement) Vision Assessment?:  (baseline vision issues)     Perception Perception: Not tested       Praxis Praxis: Not tested       Pertinent Vitals/Pain Pain Assessment Pain Assessment: No/denies pain     Extremity/Trunk Assessment Upper Extremity Assessment Upper Extremity Assessment: Overall WFL for tasks assessed   Lower Extremity Assessment Lower Extremity Assessment: Defer to PT evaluation   Cervical / Trunk Assessment Cervical / Trunk Assessment: Normal   Communication Communication Communication: No apparent difficulties   Cognition Arousal: Alert Behavior During Therapy: WFL for tasks assessed/performed Cognition: No apparent impairments             OT - Cognition Comments: Pt pleasant with needing some additional verbal cues; emotional at times discussing family.                 Following commands: Intact       Cueing  General Comments   Cueing Techniques: Verbal cues;Gestural cues;Tactile cues                 Home Living Family/patient expects to be discharged to:: Private residence Living Arrangements: Spouse/significant other Available Help at Discharge: Family;Available 24 hours/day Type of Home: House       Home Layout: Able to live on main level with  bedroom/bathroom;Laundry or work area in basement     [employer] Shower/Tub: Chief Strategy Officer: Handicapped height Bathroom Accessibility: No   Home Equipment: Agricultural consultant (2 wheels);Grab bars - toilet          Prior Functioning/Environment Prior Level of Function : Needs assist       Physical Assist : ADLs (physical)   ADLs (physical): IADLs;Bathing;Dressing Mobility 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    OT Problem List: Decreased activity tolerance;Impaired balance (sitting and/or standing);Obesity   OT Treatment/Interventions: Self-care/ADL training;Therapeutic exercise;Therapeutic activities;Patient/family education;Balance training;DME and/or AE instruction      OT Goals(Current goals can be found in the care plan section)   Acute Rehab OT Goals Patient Stated Goal: return home OT Goal Formulation: With patient/family Time For Goal Achievement: 08/03/23 Potential to Achieve Goals: Good   OT Frequency:  Min 1X/week    Co-evaluation PT/OT/SLP Co-Evaluation/Treatment: Yes Reason for Co-Treatment: To address functional/ADL transfers   OT goals addressed during session: ADL's and self-care      AM-PAC OT "6 Clicks" Daily Activity     Outcome Measure Help from another person eating meals?: None Help from another person taking care of personal grooming?: A Little Help  from another person toileting, which includes using toliet, bedpan, or urinal?: A Little Help from another person bathing (including washing, rinsing, drying)?: A Little Help from another person to put on and taking off regular upper body clothing?: A Little Help from another person to put on and taking off regular lower body clothing?: A Little 6 Click Score: 19   End of Session Equipment Utilized During Treatment: Rolling walker (2 wheels);Gait belt (cane)  Activity  Tolerance: Patient tolerated treatment well Patient left: in chair;with call bell/phone within reach;with family/visitor present  OT Visit Diagnosis: Unsteadiness on feet (R26.81);Other abnormalities of gait and mobility (R26.89);Muscle weakness (generalized) (M62.81);History of falling (Z91.81)                Time: 1308-6578 OT Time Calculation (min): 18 min Charges:  OT General Charges $OT Visit: 1 Visit OT Evaluation $OT Eval Low Complexity: 1 Low  Yussuf Sawyers OT, MOT   Danie Chandler 07/20/2023, 10:41 AM

## 2023-07-20 NOTE — Discharge Instructions (Signed)
Please follow up with Rheumatology. An appointment has been requested and you will be called with date and time.

## 2023-07-20 NOTE — Evaluation (Signed)
Physical Therapy Evaluation Patient Details Name: Paul Lam MRN: [id number] DOB: 1940/11/21 Today's Date: 07/20/2023  History of Present Illness  Paul Lam  is a 83 y.o. male,of nonobstructive CAD, A. fib, on any anticoagulation anymore in the setting of GI bleed in the past with history of Watchman procedure 2022, nephrolithiasis, hyperlipidemia, hypertension, OSA, chronic diastolic CHF.  -Presents to ED secondary to leg swelling, pain and erythema, patient reports symptoms has been going on for last 3 weeks, he was seen by urgent care 8 days ago, for which his venous Dopplers was negative for DVT, there was a patient for vasculitis, he was given Keflex, went to PCP today, he instructed patient to come to ED for IV antibiotics, patient himself denies fever, chills.  -In ED patient afebrile, no leukocytosis, started empirically on IV antibiotics, and [facility] hospitalist consulted to admit. (per MD)   Clinical Impression  Patient was agreeable to therapy. Patient performed tasks assessed with CTG/min assist. Gilmer Mor was used for bed to chair transfer and patient was unsteady during transfers. RW was provided for ambulation to increase balance. Patient demonstrated good recall on RW use.  Was left in chair at conclusion of session. Patient will benefit from continued skilled physical therapy in hospital and recommended venue below to increase strength, balance, endurance for safe ADLs and gait.        If plan is discharge home, recommend the following: A little help with walking and/or transfers;A little help with bathing/dressing/bathroom;Assistance with cooking/housework;Help with stairs or ramp for entrance   Can travel by private vehicle        Equipment Recommendations None recommended by PT  Recommendations for Other Services       Functional Status Assessment Patient has had a recent decline in their functional status and demonstrates the ability to make significant improvements in  function in a reasonable and predictable amount of time.     Precautions / Restrictions Precautions Precautions: Fall Recall of Precautions/Restrictions: Intact Restrictions Weight Bearing Restrictions Per Provider Order: No      Mobility  Bed Mobility Overal bed mobility: Needs Assistance Bed Mobility: Supine to Sit     Supine to sit: Contact guard, Min assist     General bed mobility comments: labored effort    Transfers Overall transfer level: Needs assistance Equipment used: Straight cane Transfers: Sit to/from Stand, Bed to chair/wheelchair/BSC Sit to Stand: Contact guard assist, Min assist Stand pivot transfers: Contact guard assist, Min assist         General transfer comment: Unsteady for bed to chair transfere without RW and using cane only.    Ambulation/Gait Ambulation/Gait assistance: Min assist Gait Distance (Feet): 45 Feet Assistive device: Rolling walker (2 wheels) Gait Pattern/deviations: Decreased step length - right, Decreased step length - left, Step-to pattern Gait velocity: decreased     General Gait Details: slow labored movements, RW increased balance during ambulation, demonstrated good recall with RW  Stairs            Wheelchair Mobility     Tilt Bed    Modified Rankin (Stroke Patients Only)       Balance Overall balance assessment: Needs assistance Sitting-balance support: No upper extremity supported, Feet supported Sitting balance-Leahy Scale: Fair Sitting balance - Comments: seated at EOB   Standing balance support: Bilateral upper extremity supported, During functional activity Standing balance-Leahy Scale: Poor Standing balance comment: poor with cane; fair to good with RW  Pertinent Vitals/Pain Pain Assessment Pain Assessment: No/denies pain    Home Living Family/patient expects to be discharged to:: Private residence Living Arrangements: Spouse/significant  other Available Help at Discharge: Family;Available 24 hours/day Type of Home: House         Home Layout: Able to live on main level with bedroom/bathroom;Laundry or work area in [employer] Equipment: Agricultural consultant (2 wheels);Grab bars - toilet      Prior Function Prior Level of Function : Needs assist       Physical Assist : ADLs (physical)   ADLs (physical): IADLs;Bathing;Dressing Mobility 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     Extremity/Trunk Assessment   Upper Extremity Assessment Upper Extremity Assessment: Defer to OT evaluation    Lower Extremity Assessment Lower Extremity Assessment: Generalized weakness    Cervical / Trunk Assessment Cervical / Trunk Assessment: Normal  Communication   Communication Communication: No apparent difficulties    Cognition Arousal: Alert Behavior During Therapy: WFL for tasks assessed/performed                             Following commands: Intact       Cueing Cueing Techniques: Verbal cues, Gestural cues, Tactile cues     General Comments      Exercises     Assessment/Plan    PT Assessment Patient needs continued PT services  PT Problem List Decreased strength;Decreased balance;Decreased activity tolerance;Decreased mobility       PT Treatment Interventions DME instruction;Gait training;Stair training;Functional mobility training;Therapeutic activities;Therapeutic exercise;Balance training;Patient/family education    PT Goals (Current goals can be found in the Care Plan section)  Acute Rehab PT Goals Patient Stated Goal: to return home PT Goal Formulation: With patient/family Time For Goal Achievement: 08/03/23 Potential to Achieve Goals: Good    Frequency Min 3X/week     Co-evaluation PT/OT/SLP Co-Evaluation/Treatment: Yes Reason for Co-Treatment: To address functional/ADL  transfers PT goals addressed during session: Mobility/safety with mobility OT goals addressed during session: ADL's and self-care       AM-PAC PT "6 Clicks" Mobility  Outcome Measure Help needed turning from your back to your side while in a flat bed without using bedrails?: A Little Help needed moving from lying on your back to sitting on the side of a flat bed without using bedrails?: A Little Help needed moving to and from a bed to a chair (including a wheelchair)?: A Little Help needed standing up from a chair using your arms (e.g., wheelchair or bedside chair)?: A Little Help needed to walk in hospital room?: A Little Help needed climbing 3-5 steps with a railing? : A Lot 6 Click Score: 17    End of Session Equipment Utilized During Treatment: Gait belt Activity Tolerance: Patient tolerated treatment well Patient left: in chair;with call bell/phone within reach;with family/visitor present Nurse Communication: Mobility status PT Visit Diagnosis: Unsteadiness on feet (R26.81);Other abnormalities of gait and mobility (R26.89);Muscle weakness (generalized) (M62.81)    Time: 1610-9604 PT Time Calculation (min) (ACUTE ONLY): 20 min   Charges:   PT Evaluation $PT Eval Moderate Complexity: 1 Mod PT Treatments $Therapeutic Activity: 8-22 mins PT General Charges $$ ACUTE PT VISIT: 1 Visit         Satoshi Kalas SPT

## 2023-07-20 NOTE — Discharge Summary (Signed)
Physician Discharge Summary   Patient: Paul Lam MRN: [id number] DOB: 09-Feb-1941  Admit date:     07/19/2023  Discharge date: 07/20/23  Discharge Physician: MDALA-GAUSI, Gwenette Greet   PCP: Assunta Found, MD   Recommendations at discharge:   Follow-up with rheumatology  Discharge Diagnoses: Principal Problem:   Cellulitis of right lower extremity Active Problems:   Morbid obesity due to excess calories (HCC)   OSA (obstructive sleep apnea)   Hypertension   Atrial fibrillation, chronic (HCC)   Thrombocytopenia (HCC)   Benign localized prostatic hyperplasia with lower urinary tract symptoms (LUTS)   Vasculitis (HCC)  Resolved Problems:   * No resolved hospital problems. *  Hospital Course: 83 year old man with PMH of CAD, atrial fibrillation s/p Watchman procedure, nephrolithiasis, hyperlipidemia, HTN, OSA, CHF who presented to the ED with leg swelling, pain, erythema for 3 weeks.  Patient had been seen in the urgent care 8 days prior and venous Dopplers were negative for DVT.  Suspected to have vasculitis, treated with Keflex and discharged home.  Patient was seen by PCP on the day of admission and PCP advised patient to come to the ED for IV antibiotics given suspicion for cellulitis.    Provider noted right lower extremity petechiae which were not warm or tender to palpation.  Patient did not have leukocytosis.  ESR and CRP were not elevated.  C3 and C4 were pending.  There was no convincing evidence of cellulitis and antibiotics were discontinued.  The patient was diuresed in the ED and was started on prednisone at discharge.  An ambulatory referral to rheumatology was placed, and the patient was discharged home.  Consultants: n/a Procedures performed: n/a  Disposition: Home Diet recommendation:  Discharge Diet Orders (From admission, onward)     Start     Ordered   07/20/23 0000  Diet - low sodium heart healthy        07/20/23 1456           Cardiac  diet DISCHARGE MEDICATION: Allergies as of 07/20/2023   No Known Allergies      Medication List     STOP taking these medications    cephALEXin 500 MG capsule Commonly known as: KEFLEX       TAKE these medications    acetaminophen 500 MG tablet Commonly known as: TYLENOL Take 1,000 mg by mouth every 6 (six) hours as needed for mild pain (pain score 1-3).   ALPRAZolam 0.5 MG tablet Commonly known as: XANAX Take 0.5 mg at bedtime by mouth. Anxiety   aspirin EC 81 MG tablet Take 1 tablet (81 mg total) by mouth daily. Swallow whole.   bacitracin-polymyxin b ointment Commonly known as: POLYSPORIN Apply 1 Application topically 2 (two) times daily.   brimonidine 0.2 % ophthalmic solution Commonly known as: ALPHAGAN Place 1 drop into the left eye in the morning and at bedtime.   carboxymethylcellulose 0.5 % Soln Commonly known as: REFRESH PLUS Place 1 drop into the right eye daily as needed (dry eyes).   celecoxib 200 MG capsule Commonly known as: CELEBREX Take 200 mg by mouth 2 (two) times daily.   cyanocobalamin 500 MCG tablet Commonly known as: VITAMIN B12 Take 500 mcg by mouth daily.   docusate sodium 100 MG capsule Commonly known as: COLACE Take 100 mg by mouth daily.   FIBER-CAPS PO Take 1 capsule by mouth daily.   finasteride 5 MG tablet Commonly known as: PROSCAR Take 1 tablet (5 mg total) by mouth daily.  furosemide 40 MG tablet Commonly known as: LASIX Take 1 tablet (40 mg total) by mouth daily.   ibuprofen 100 MG tablet Commonly known as: ADVIL Take 200 mg by mouth every 6 (six) hours as needed for fever.   metoprolol tartrate 25 MG tablet Commonly known as: LOPRESSOR Take 0.5 tablets (12.5 mg total) by mouth 2 (two) times daily.   NON FORMULARY at bedtime. CPAP   potassium chloride 10 MEQ tablet Commonly known as: KLOR-CON Take 10 mEq by mouth daily.   predniSONE 20 MG tablet Commonly known as: DELTASONE Take 2 tablets (40 mg  total) by mouth daily with breakfast for 5 days, THEN 1 tablet (20 mg total) daily with breakfast for 5 days. Start taking on: July 20, 2023   simvastatin 40 MG tablet Commonly known as: ZOCOR Take 40 mg by mouth at bedtime.   tamsulosin 0.4 MG Caps capsule Commonly known as: FLOMAX Take 1 capsule (0.4 mg total) by mouth in the morning and at bedtime.   TART CHERRY ADVANCED PO Take 1 tablet by mouth daily.   vitamin C 1000 MG tablet Take 1,000 mg by mouth daily.   Vitamin D3 50 MCG (2000 UT) Tabs Take 4,000 Units by mouth daily.   Zinc 50 MG Tabs Take 50 mg by mouth daily.        Discharge Exam: Filed Weights   07/19/23 1611  Weight: 95.3 kg   Physical Exam on Day of Discharge   General: Alert, cheerful, oriented X3  Oral cavity: moist mucous membranes  Neck: supple  Chest: clear to auscultation. No crackles, no wheezes  CVS: S1,S2 RRR. No murmurs  Abd: No distention, soft, non-tender. No masses palpable  Extr: Bilateral lower extremity edema with mild pitting, nonblanching areas of hyperemia on right lower extremity.  Condition at discharge: stable  The results of significant diagnostics from this hospitalization (including imaging, microbiology, ancillary and laboratory) are listed below for reference.   Imaging Studies: US Venous Img Lower Right (DVT Study) Result Date: 07/15/2023 CLINICAL DATA:  leg pain/swelling EXAM: RIGHT LOWER EXTREMITY VENOUS DOPPLER ULTRASOUND TECHNIQUE: Gray-scale sonography with compression, as well as color and duplex ultrasound, were performed to evaluate the deep venous system(s) from the level of the common femoral vein through the popliteal and proximal calf veins. COMPARISON:  RIGHT lower extremity XRs, 06/27/2023. FINDINGS: VENOUS Normal compressibility of the common femoral, superficial femoral, and popliteal veins, as well as the visualized calf veins. Visualized portions of profunda femoral vein and great saphenous vein  unremarkable. No filling defects to suggest DVT on grayscale or color Doppler imaging. Doppler waveforms show normal direction of venous flow, normal respiratory plasticity and response to augmentation. Limited views of the contralateral common femoral vein are unremarkable. OTHER No evidence of superficial thrombophlebitis or abnormal fluid collection. Subcutaneous edema of the imaged distal RIGHT lower extremity Limitations: Patient body habitus IMPRESSION: No evidence of femoropopliteal DVT or superficial thrombophlebitis within the RIGHT lower extremity. Roanna Banning, MD [facility] Electronically Signed   By: Roanna Banning M.D.   On: 07/15/2023 09:30   DG Hand Complete Right Result Date: 06/27/2023 CLINICAL DATA:  Fall today. EXAM: RIGHT HAND - COMPLETE 3+ VIEW; RIGHT WRIST - COMPLETE 3+ VIEW COMPARISON:  None Available. FINDINGS: Right wrist: Severe thumb carpometacarpal joint space narrowing, subchondral sclerosis/cystic change, and peripheral osteophytosis. Severe triscaphe joint space narrowing. Severe capitate-lunate and cavity scaphoid joint space narrowing bone-on-bone contact. Moderate fifth carpometacarpal joint space narrowing. Right hand:  Moderate first through fifth interphalangeal joint space narrowing and peripheral osteophytosis, greatest within the thumb interphalangeal joint and second and third DIP joints. Moderate second and third metacarpophalangeal joint space narrowing. No acute fracture or dislocation. Moderate to high-grade atherosclerotic calcifications. IMPRESSION: 1. No acute fracture. 2. Severe thumb carpometacarpal and triscaphe osteoarthritis. 3. Moderate first through fifth interphalangeal osteoarthritis. Electronically Signed   By: Neita Garnet M.D.   On: 06/27/2023 16:36   DG Wrist Complete Right Result Date: 06/27/2023 CLINICAL DATA:  Fall today. EXAM: RIGHT HAND - COMPLETE 3+ VIEW; RIGHT WRIST - COMPLETE 3+ VIEW  COMPARISON:  None Available. FINDINGS: Right wrist: Severe thumb carpometacarpal joint space narrowing, subchondral sclerosis/cystic change, and peripheral osteophytosis. Severe triscaphe joint space narrowing. Severe capitate-lunate and cavity scaphoid joint space narrowing bone-on-bone contact. Moderate fifth carpometacarpal joint space narrowing. Right hand: Moderate first through fifth interphalangeal joint space narrowing and peripheral osteophytosis, greatest within the thumb interphalangeal joint and second and third DIP joints. Moderate second and third metacarpophalangeal joint space narrowing. No acute fracture or dislocation. Moderate to high-grade atherosclerotic calcifications. IMPRESSION: 1. No acute fracture. 2. Severe thumb carpometacarpal and triscaphe osteoarthritis. 3. Moderate first through fifth interphalangeal osteoarthritis. Electronically Signed   By: Neita Garnet M.D.   On: 06/27/2023 16:36   DG Elbow Complete Right Result Date: 06/27/2023 CLINICAL DATA:  Witnessed fall face forward today. EXAM: RIGHT ELBOW - COMPLETE 3+ VIEW COMPARISON:  None Available. FINDINGS: Mild trochlear-olecranon joint space narrowing and peripheral medial elbow degenerative osteophytosis. Mild degenerative spurring at the radial head-neck junction and tip of the coronoid process. Minimal chronic enthesopathic change at the triceps insertion on the olecranon. Two chronic ossicles measuring up to 4 mm at the common extensor tendon origin at the lateral epicondyle. Normal position of the distal anterior humeral fat pad without evidence of elbow joint effusion. No acute fracture or dislocation. IMPRESSION: 1. Mild elbow osteoarthritis. 2. No acute fracture. Electronically Signed   By: Neita Garnet M.D.   On: 06/27/2023 16:33   DG Ankle Complete Left Result Date: 06/27/2023 CLINICAL DATA:  Witnessed fall.  Left ankle and foot pain. EXAM: LEFT ANKLE COMPLETE - 3+ VIEW; LEFT FOOT - COMPLETE 3+ VIEW COMPARISON:   None Available. FINDINGS: Large body habitus. Left ankle: Mild to moderate distal medial malleolus, distal fibula, and adjacent talar degenerative osteophytes. Moderate plantar calcaneal heel spur. Mild chronic enthesopathic change at the Achilles insertion on the calcaneus. Minimal dorsal talonavicular navicular-cuneiform degenerative osteophytes. No acute fracture or dislocation. The ankle mortise is symmetric and intact. Left foot: Mild interphalangeal osteoarthritis diffusely. Mild subchondral sclerosis and peripheral spurring of the midfoot. No acute fracture or dislocation. Moderate atherosclerotic calcifications. IMPRESSION: 1. No acute fracture. 2. Mild-to-moderate ankle osteoarthritis. 3. Moderate plantar calcaneal heel spur. Electronically Signed   By: Neita Garnet M.D.   On: 06/27/2023 16:30   DG Foot Complete Left Result Date: 06/27/2023 CLINICAL DATA:  Witnessed fall.  Left ankle and foot pain. EXAM: LEFT ANKLE COMPLETE - 3+ VIEW; LEFT FOOT - COMPLETE 3+ VIEW COMPARISON:  None Available. FINDINGS: Large body habitus. Left ankle: Mild to moderate distal medial malleolus, distal fibula, and adjacent talar degenerative osteophytes. Moderate plantar calcaneal heel spur. Mild chronic enthesopathic change at the Achilles insertion on the calcaneus. Minimal dorsal talonavicular navicular-cuneiform degenerative osteophytes. No acute fracture or dislocation. The ankle mortise is symmetric and intact. Left foot: Mild interphalangeal osteoarthritis diffusely. Mild subchondral sclerosis and peripheral spurring of the midfoot. No acute fracture or dislocation. Moderate atherosclerotic calcifications.  IMPRESSION: 1. No acute fracture. 2. Mild-to-moderate ankle osteoarthritis. 3. Moderate plantar calcaneal heel spur. Electronically Signed   By: Neita Garnet M.D.   On: 06/27/2023 16:30   DG Knee Complete 4 Views Right Result Date: 06/27/2023 CLINICAL DATA:  Fall and right knee pain. EXAM: RIGHT KNEE - COMPLETE  4+ VIEW COMPARISON:  Right knee radiograph dated 10/26/2022. FINDINGS: There is a total right knee arthroplasty. The arthroplasty components appear intact and in anatomic alignment. There is no acute fracture or dislocation. The bones are osteopenic. No significant joint effusion. The soft tissues are unremarkable. IMPRESSION: 1. No acute fracture or dislocation. 2. Total right knee arthroplasty. Electronically Signed   By: Elgie Collard M.D.   On: 06/27/2023 16:28   DG Hip Unilat W or Wo Pelvis 2-3 Views Left Result Date: 06/27/2023 CLINICAL DATA:  Pain after fall EXAM: DG HIP (WITH OR WITHOUT PELVIS) 3V LEFT COMPARISON:  None Available. FINDINGS: Osteopenia. No fracture or dislocation. Mild joint space loss of the hips. There is small osteophytes along the sacroiliac joints. Hyperostosis. Scattered vascular calcifications. With this level of osteopenia subtle nondisplaced injury is difficult to completely exclude and if needed additional cross-sectional imaging evaluation as clinically appropriate for further sensitivity. IMPRESSION: Osteopenia with degenerative changes. Electronically Signed   By: Karen Kays M.D.   On: 06/27/2023 16:28   CT Head Wo Contrast Result Date: 06/27/2023 CLINICAL DATA:  Neck trauma.  Witnessed fall with trauma to the face EXAM: CT HEAD WITHOUT CONTRAST CT MAXILLOFACIAL WITHOUT CONTRAST CT CERVICAL SPINE WITHOUT CONTRAST TECHNIQUE: Multidetector CT imaging of the head, cervical spine, and maxillofacial structures were performed using the standard protocol without intravenous contrast. Multiplanar CT image reconstructions of the cervical spine and maxillofacial structures were also generated. RADIATION DOSE REDUCTION: This exam was performed according to the departmental dose-optimization program which includes automated exposure control, adjustment of the mA and/or kV according to patient size and/or use of iterative reconstruction technique. COMPARISON:  None Available.  FINDINGS: CT HEAD FINDINGS Brain: Brain atrophy with relative temporal lobe predominance. Chronic small-vessel ischemic changes of the white matter. No sign of acute infarction, mass lesion, hemorrhage, hydrocephalus or extra-axial collection. Vascular: There is atherosclerotic calcification of the major vessels at the base of the brain. Skull: No skull fracture Other: None CT MAXILLOFACIAL FINDINGS Osseous: No facial fracture. Orbits: No soft tissue orbital injury.  Previous ocular surgery. Sinuses: Previous FESS.  No active inflammatory disease. Soft tissues: No significant regional soft tissue finding. CT CERVICAL SPINE FINDINGS Alignment: Normal Skull base and vertebrae: No regional fracture. Soft tissues and spinal canal: No traumatic soft tissue finding. Disc levels: Ordinary spondylosis at C5-6 with mild bony foraminal narrowing on the right. Solid bridging osteophytes at C6-7, C7 T1 and the upper thoracic region. Chronic sclerosis of the right second rib, possibly due to Paget's disease. Upper chest: Limited visualization.  Negative. Other: None IMPRESSION: HEAD CT: No acute or traumatic finding. Brain atrophy with relative temporal lobe predominance. Chronic small-vessel ischemic changes of the white matter. MAXILLOFACIAL CT: No facial fracture. Previous ocular surgery and FESS. CERVICAL SPINE CT: No acute or traumatic finding. Ordinary spondylosis at C5-6 with mild bony foraminal narrowing on the right. Electronically Signed   By: Paulina Fusi M.D.   On: 06/27/2023 16:01   CT Maxillofacial Wo Contrast Result Date: 06/27/2023 CLINICAL DATA:  Neck trauma.  Witnessed fall with trauma to the face EXAM: CT HEAD WITHOUT CONTRAST CT MAXILLOFACIAL WITHOUT CONTRAST CT CERVICAL SPINE WITHOUT CONTRAST TECHNIQUE: Multidetector  CT imaging of the head, cervical spine, and maxillofacial structures were performed using the standard protocol without intravenous contrast. Multiplanar CT image reconstructions of the  cervical spine and maxillofacial structures were also generated. RADIATION DOSE REDUCTION: This exam was performed according to the departmental dose-optimization program which includes automated exposure control, adjustment of the mA and/or kV according to patient size and/or use of iterative reconstruction technique. COMPARISON:  None Available. FINDINGS: CT HEAD FINDINGS Brain: Brain atrophy with relative temporal lobe predominance. Chronic small-vessel ischemic changes of the white matter. No sign of acute infarction, mass lesion, hemorrhage, hydrocephalus or extra-axial collection. Vascular: There is atherosclerotic calcification of the major vessels at the base of the brain. Skull: No skull fracture Other: None CT MAXILLOFACIAL FINDINGS Osseous: No facial fracture. Orbits: No soft tissue orbital injury.  Previous ocular surgery. Sinuses: Previous FESS.  No active inflammatory disease. Soft tissues: No significant regional soft tissue finding. CT CERVICAL SPINE FINDINGS Alignment: Normal Skull base and vertebrae: No regional fracture. Soft tissues and spinal canal: No traumatic soft tissue finding. Disc levels: Ordinary spondylosis at C5-6 with mild bony foraminal narrowing on the right. Solid bridging osteophytes at C6-7, C7 T1 and the upper thoracic region. Chronic sclerosis of the right second rib, possibly due to Paget's disease. Upper chest: Limited visualization.  Negative. Other: None IMPRESSION: HEAD CT: No acute or traumatic finding. Brain atrophy with relative temporal lobe predominance. Chronic small-vessel ischemic changes of the white matter. MAXILLOFACIAL CT: No facial fracture. Previous ocular surgery and FESS. CERVICAL SPINE CT: No acute or traumatic finding. Ordinary spondylosis at C5-6 with mild bony foraminal narrowing on the right. Electronically Signed   By: Paulina Fusi M.D.   On: 06/27/2023 16:01   CT Cervical Spine Wo Contrast Result Date: 06/27/2023 CLINICAL DATA:  Neck trauma.   Witnessed fall with trauma to the face EXAM: CT HEAD WITHOUT CONTRAST CT MAXILLOFACIAL WITHOUT CONTRAST CT CERVICAL SPINE WITHOUT CONTRAST TECHNIQUE: Multidetector CT imaging of the head, cervical spine, and maxillofacial structures were performed using the standard protocol without intravenous contrast. Multiplanar CT image reconstructions of the cervical spine and maxillofacial structures were also generated. RADIATION DOSE REDUCTION: This exam was performed according to the departmental dose-optimization program which includes automated exposure control, adjustment of the mA and/or kV according to patient size and/or use of iterative reconstruction technique. COMPARISON:  None Available. FINDINGS: CT HEAD FINDINGS Brain: Brain atrophy with relative temporal lobe predominance. Chronic small-vessel ischemic changes of the white matter. No sign of acute infarction, mass lesion, hemorrhage, hydrocephalus or extra-axial collection. Vascular: There is atherosclerotic calcification of the major vessels at the base of the brain. Skull: No skull fracture Other: None CT MAXILLOFACIAL FINDINGS Osseous: No facial fracture. Orbits: No soft tissue orbital injury.  Previous ocular surgery. Sinuses: Previous FESS.  No active inflammatory disease. Soft tissues: No significant regional soft tissue finding. CT CERVICAL SPINE FINDINGS Alignment: Normal Skull base and vertebrae: No regional fracture. Soft tissues and spinal canal: No traumatic soft tissue finding. Disc levels: Ordinary spondylosis at C5-6 with mild bony foraminal narrowing on the right. Solid bridging osteophytes at C6-7, C7 T1 and the upper thoracic region. Chronic sclerosis of the right second rib, possibly due to Paget's disease. Upper chest: Limited visualization.  Negative. Other: None IMPRESSION: HEAD CT: No acute or traumatic finding. Brain atrophy with relative temporal lobe predominance. Chronic small-vessel ischemic changes of the white matter.  MAXILLOFACIAL CT: No facial fracture. Previous ocular surgery and FESS. CERVICAL SPINE CT: No acute or  traumatic finding. Ordinary spondylosis at C5-6 with mild bony foraminal narrowing on the right. Electronically Signed   By: Paulina Fusi M.D.   On: 06/27/2023 16:01    Microbiology: Results for orders placed or performed during the hospital encounter of 05/31/22  Resp panel by RT-PCR (RSV, Flu A&B, Covid) Anterior Nasal Swab     Status: Abnormal   Collection Time: 05/31/22  9:30 AM   Specimen: Anterior Nasal Swab  Result Value Ref Range Status   SARS Coronavirus 2 by RT PCR NEGATIVE NEGATIVE Final    Comment: (NOTE) SARS-CoV-2 target nucleic acids are NOT DETECTED.  The SARS-CoV-2 RNA is generally detectable in upper respiratory specimens during the acute phase of infection. The lowest concentration of SARS-CoV-2 viral copies this assay can detect is 138 copies/mL. A negative result does not preclude SARS-Cov-2 infection and should not be used as the sole basis for treatment or other patient management decisions. A negative result may occur with  improper specimen collection/handling, submission of specimen other than nasopharyngeal swab, presence of viral mutation(s) within the areas targeted by this assay, and inadequate number of viral copies(<138 copies/mL). A negative result must be combined with clinical observations, patient history, and epidemiological information. The expected result is Negative.  Fact Sheet for Patients:  BloggerCourse.com  Fact Sheet for Healthcare Providers:  SeriousBroker.it  This test is no t yet approved or cleared by the [place] FDA and  has been authorized for detection and/or diagnosis of SARS-CoV-2 by FDA under an Emergency Use Authorization (EUA). This EUA will remain  in effect (meaning this test can be used) for the duration of the COVID-19 declaration under Section 564(b)(1) of the  Act, 21 U.S.C.section 360bbb-3(b)(1), unless the authorization is terminated  or revoked sooner.       Influenza A by PCR NEGATIVE NEGATIVE Final   Influenza B by PCR NEGATIVE NEGATIVE Final    Comment: (NOTE) The Xpert Xpress SARS-CoV-2/FLU/RSV plus assay is intended as an aid in the diagnosis of influenza from Nasopharyngeal swab specimens and should not be used as a sole basis for treatment. Nasal washings and aspirates are unacceptable for Xpert Xpress SARS-CoV-2/FLU/RSV testing.  Fact Sheet for Patients: BloggerCourse.com  Fact Sheet for Healthcare Providers: SeriousBroker.it  This test is not yet approved or cleared by the [place] FDA and has been authorized for detection and/or diagnosis of SARS-CoV-2 by FDA under an Emergency Use Authorization (EUA). This EUA will remain in effect (meaning this test can be used) for the duration of the COVID-19 declaration under Section 564(b)(1) of the Act, 21 U.S.C. section 360bbb-3(b)(1), unless the authorization is terminated or revoked.     Resp Syncytial Virus by PCR POSITIVE (A) NEGATIVE Final    Comment: (NOTE) Fact Sheet for Patients: BloggerCourse.com  Fact Sheet for Healthcare Providers: SeriousBroker.it  This test is not yet approved or cleared by the [place] FDA and has been authorized for detection and/or diagnosis of SARS-CoV-2 by FDA under an Emergency Use Authorization (EUA). This EUA will remain in effect (meaning this test can be used) for the duration of the COVID-19 declaration under Section 564(b)(1) of the Act, 21 U.S.C. section 360bbb-3(b)(1), unless the authorization is terminated or revoked.  Performed at [facility], [address], [place], [place] [address]     Labs: CBC: Recent Labs  Lab 07/15/23 0928 07/19/23 1729 07/20/23 0248  WBC 6.1 5.9 5.8  NEUTROABS 4.4 3.7  --   HGB 12.5*  13.0 12.5*  HCT 39.3 41.2  38.0*  MCV 92.9 93.4 89.2  PLT 100* 111* 102*   Basic Metabolic Panel: Recent Labs  Lab 07/15/23 0928 07/19/23 1729 07/20/23 0248  NA 139 140 138  K 4.4 4.5 4.4  CL 103 102 105  CO2 29 27 26   GLUCOSE 103* 95 149*  BUN 19 19 19   CREATININE 0.92 0.88 0.90  CALCIUM 8.6* 8.9 8.5*   Liver Function Tests: No results for input(s): "AST", "ALT", "ALKPHOS", "BILITOT", "PROT", "ALBUMIN" in the last 168 hours. CBG: No results for input(s): "GLUCAP" in the last 168 hours.  Discharge time spent: greater than 30 minutes.  Signed: MDALA-GAUSI, Gwenette Greet, MD [facility] 07/20/2023

## 2023-07-21 LAB — C4 COMPLEMENT: Complement C4, Body Fluid: 15 mg/dL (ref 12–38)

## 2023-07-21 LAB — C3 COMPLEMENT: C3 Complement: 124 mg/dL (ref 82–167)

## 2023-07-25 DIAGNOSIS — M25561 Pain in right knee: Secondary | ICD-10-CM | POA: Diagnosis not present

## 2023-07-27 DIAGNOSIS — M25561 Pain in right knee: Secondary | ICD-10-CM | POA: Diagnosis not present

## 2023-07-28 DIAGNOSIS — I10 Essential (primary) hypertension: Secondary | ICD-10-CM | POA: Diagnosis not present

## 2023-07-28 DIAGNOSIS — Z20822 Contact with and (suspected) exposure to covid-19: Secondary | ICD-10-CM | POA: Diagnosis not present

## 2023-07-28 DIAGNOSIS — R1012 Left upper quadrant pain: Secondary | ICD-10-CM | POA: Diagnosis not present

## 2023-07-28 DIAGNOSIS — R1013 Epigastric pain: Secondary | ICD-10-CM | POA: Diagnosis not present

## 2023-07-28 DIAGNOSIS — K5730 Diverticulosis of large intestine without perforation or abscess without bleeding: Secondary | ICD-10-CM | POA: Diagnosis not present

## 2023-07-28 DIAGNOSIS — K8020 Calculus of gallbladder without cholecystitis without obstruction: Secondary | ICD-10-CM | POA: Diagnosis not present

## 2023-07-28 DIAGNOSIS — E860 Dehydration: Secondary | ICD-10-CM | POA: Diagnosis not present

## 2023-07-28 DIAGNOSIS — N281 Cyst of kidney, acquired: Secondary | ICD-10-CM | POA: Diagnosis not present

## 2023-07-28 DIAGNOSIS — R112 Nausea with vomiting, unspecified: Secondary | ICD-10-CM | POA: Diagnosis not present

## 2023-07-28 DIAGNOSIS — R748 Abnormal levels of other serum enzymes: Secondary | ICD-10-CM | POA: Diagnosis not present

## 2023-07-28 DIAGNOSIS — R1084 Generalized abdominal pain: Secondary | ICD-10-CM | POA: Diagnosis not present

## 2023-07-29 DIAGNOSIS — N281 Cyst of kidney, acquired: Secondary | ICD-10-CM | POA: Diagnosis not present

## 2023-07-29 DIAGNOSIS — K5730 Diverticulosis of large intestine without perforation or abscess without bleeding: Secondary | ICD-10-CM | POA: Diagnosis not present

## 2023-07-29 DIAGNOSIS — K8020 Calculus of gallbladder without cholecystitis without obstruction: Secondary | ICD-10-CM | POA: Diagnosis not present

## 2023-07-31 DIAGNOSIS — L03032 Cellulitis of left toe: Secondary | ICD-10-CM | POA: Diagnosis not present

## 2023-07-31 DIAGNOSIS — M25561 Pain in right knee: Secondary | ICD-10-CM | POA: Diagnosis not present

## 2023-07-31 DIAGNOSIS — M79675 Pain in left toe(s): Secondary | ICD-10-CM | POA: Diagnosis not present

## 2023-07-31 DIAGNOSIS — M79672 Pain in left foot: Secondary | ICD-10-CM | POA: Diagnosis not present

## 2023-08-01 DIAGNOSIS — K080 Exfoliation of teeth due to systemic causes: Secondary | ICD-10-CM | POA: Diagnosis not present

## 2023-08-02 DIAGNOSIS — H401122 Primary open-angle glaucoma, left eye, moderate stage: Secondary | ICD-10-CM | POA: Diagnosis not present

## 2023-08-02 DIAGNOSIS — H40021 Open angle with borderline findings, high risk, right eye: Secondary | ICD-10-CM | POA: Diagnosis not present

## 2023-08-02 DIAGNOSIS — Z961 Presence of intraocular lens: Secondary | ICD-10-CM | POA: Diagnosis not present

## 2023-08-03 DIAGNOSIS — M25561 Pain in right knee: Secondary | ICD-10-CM | POA: Diagnosis not present

## 2023-08-04 DIAGNOSIS — F419 Anxiety disorder, unspecified: Secondary | ICD-10-CM | POA: Diagnosis not present

## 2023-08-04 DIAGNOSIS — Z6836 Body mass index (BMI) 36.0-36.9, adult: Secondary | ICD-10-CM | POA: Diagnosis not present

## 2023-08-04 DIAGNOSIS — E6609 Other obesity due to excess calories: Secondary | ICD-10-CM | POA: Diagnosis not present

## 2023-08-04 DIAGNOSIS — L03119 Cellulitis of unspecified part of limb: Secondary | ICD-10-CM | POA: Diagnosis not present

## 2023-08-04 DIAGNOSIS — I776 Arteritis, unspecified: Secondary | ICD-10-CM | POA: Diagnosis not present

## 2023-08-04 DIAGNOSIS — L03115 Cellulitis of right lower limb: Secondary | ICD-10-CM | POA: Diagnosis not present

## 2023-08-07 DIAGNOSIS — M25561 Pain in right knee: Secondary | ICD-10-CM | POA: Diagnosis not present

## 2023-08-08 DIAGNOSIS — M25561 Pain in right knee: Secondary | ICD-10-CM | POA: Diagnosis not present

## 2023-08-10 DIAGNOSIS — M25561 Pain in right knee: Secondary | ICD-10-CM | POA: Diagnosis not present

## 2023-08-14 ENCOUNTER — Ambulatory Visit: Payer: Medicare Other | Admitting: Urology

## 2023-08-14 VITALS — BP 126/63 | HR 84

## 2023-08-14 DIAGNOSIS — N401 Enlarged prostate with lower urinary tract symptoms: Secondary | ICD-10-CM | POA: Diagnosis not present

## 2023-08-14 DIAGNOSIS — N5201 Erectile dysfunction due to arterial insufficiency: Secondary | ICD-10-CM

## 2023-08-14 DIAGNOSIS — K080 Exfoliation of teeth due to systemic causes: Secondary | ICD-10-CM | POA: Diagnosis not present

## 2023-08-14 DIAGNOSIS — R351 Nocturia: Secondary | ICD-10-CM

## 2023-08-14 MED ORDER — FINASTERIDE 5 MG PO TABS: 5.0000 mg | ORAL_TABLET | Freq: Every day | ORAL | 3 refills | Status: AC

## 2023-08-14 MED ORDER — TADALAFIL 20 MG PO TABS: 20.0000 mg | ORAL_TABLET | ORAL | 5 refills | Status: DC | PRN

## 2023-08-14 MED ORDER — TAMSULOSIN HCL 0.4 MG PO CAPS
0.4000 mg | ORAL_CAPSULE | Freq: Two times a day (BID) | ORAL | 3 refills | Status: AC
Start: 2023-08-14 — End: ?

## 2023-08-14 NOTE — Progress Notes (Unsigned)
 08/14/2023 2:14 PM   Marcene Duos Kludt [birth year] [id number]  Referring provider: Assunta Found, MD [address] [place],  [place] [address]  Followup BPH and erectile dysfunction   HPI: Paul Lam is a 83yo here for followup for erectile dysfunction and BPH. IPSS 3 QOl on flomax 0.4mg  and finasteride. Nocturia 0-1x. Uirne stream strong. No strainign to urinate. He did not fill the rx for sildenafil    PMH: Past Medical History:  Diagnosis Date   Anginal pain (HCC)    Anxiety    Arthritis    generalized arthritis    Atrial fibrillation (HCC)    CAD (coronary artery disease)    Cancer (HCC)    skin cancer removed left ear     Complication of anesthesia    Depression    Headache    History of echocardiogram 02/19/2009   EF 50-55%; LA mild-mod dilated;    History of kidney stones    History of nuclear stress test 02/19/2009   low risk, normal    Hyperlipidemia    Hypertension    Obesity (BMI 30-39.9)    OSA (obstructive sleep apnea)    severe with AHI 25/hr now on BiPAP   PONV (postoperative nausea and vomiting)    Retinal detachment    with proliferative vitreoretinopathy   Shortness of breath    related to weight    Sleep apnea    Vertigo     Surgical History: Past Surgical History:  Procedure Laterality Date   APPENDECTOMY     BIOPSY  03/05/2020   Procedure: BIOPSY;  Surgeon: Malissa Hippo, MD;  [facility]: [facility];  Service: Endoscopy;;   CARDIAC CATHETERIZATION  07/13/2005   noncritical CAD   CARDIOVERSION N/A 01/29/2018   Procedure: CARDIOVERSION;  Surgeon: Lars Masson, MD;  Location: [facility] ENDOSCOPY;  Service: Cardiovascular;  Laterality: N/A;   COLONOSCOPY WITH PROPOFOL N/A 03/06/2020   Procedure: COLONOSCOPY WITH PROPOFOL;  Surgeon: Dolores Frame, MD;  [facility]: [facility];  Service: Gastroenterology;  Laterality: N/A;   ESOPHAGOGASTRODUODENOSCOPY (EGD) WITH PROPOFOL N/A 03/05/2020   Procedure: ESOPHAGOGASTRODUODENOSCOPY (EGD)  WITH PROPOFOL;  Surgeon: Malissa Hippo, MD;  [facility]: [facility];  Service: Endoscopy;  Laterality: N/A;   EYE SURGERY     FLEXIBLE SIGMOIDOSCOPY N/A 07/14/2015   Procedure: FLEXIBLE SIGMOIDOSCOPY;  Surgeon: Franky Macho, MD;  [facility]: [facility];  Service: Gastroenterology;  Laterality: N/A;   GIVENS CAPSULE STUDY N/A 04/15/2020   Procedure: GIVENS CAPSULE STUDY;  Surgeon: Dolores Frame, MD;  [facility]: [facility];  Service: Gastroenterology;  Laterality: N/A;  730   JOINT REPLACEMENT     LEFT ATRIAL APPENDAGE OCCLUSION N/A 07/09/2020   Procedure: LEFT ATRIAL APPENDAGE OCCLUSION;  Surgeon: Tonny Bollman, MD;  Location: [facility] INVASIVE CV LAB;  Service: Cardiovascular;  Laterality: N/A;   NASAL SEPTOPLASTY W/ TURBINOPLASTY Bilateral 12/19/2017   Procedure: NASAL SEPTOPLASTY WITH TURBINATE REDUCTION;  Surgeon: Newman Pies, MD;  Location: [facility];  Service: ENT;  Laterality: Bilateral;   NASAL SINUS SURGERY     PARS PLANA VITRECTOMY Left 06/20/2017   Procedure: PARS PLANA VITRECTOMY WITH 25 GAUGE, REPAIR OF COMPLEX TRACTION RETINAL DETACHMENT, MEMBRANE PEEEL;  Surgeon: Sherrie George, MD;  Location: [facility] OR;  Service: Ophthalmology;  Laterality: Left;   POLYPECTOMY  03/06/2020   Procedure: POLYPECTOMY;  Surgeon: Dolores Frame, MD;  [facility]: [facility];  Service: Gastroenterology;;   REPAIR OF COMPLEX TRACTION RETINAL DETACHMENT Left 06/20/2017  RETINAL DETACHMENT SURGERY Right 04/2011   SCLERAL BUCKLE WITH POSSIBLE 25 GAUGE PARS PLANA VITRECTOMY Left 05/22/2017   Procedure: SCLERAL BUCKLE, LASER, GAS INJECTION LEFT EYE;  Surgeon: Sherrie George, MD;  Location: [facility] OR;  Service: Ophthalmology;  Laterality: Left;   SKIN CANCER EXCISION Left 2013   left ear skin ca removed  2 weeks ago - not completeely healed 08/15/11    TEE WITHOUT CARDIOVERSION N/A 07/09/2020   Procedure: TRANSESOPHAGEAL ECHOCARDIOGRAM (TEE);  Surgeon: Tonny Bollman, MD;  Location:  [facility] INVASIVE CV LAB;  Service: Cardiovascular;  Laterality: N/A;   TEE WITHOUT CARDIOVERSION N/A 08/31/2020   Procedure: TRANSESOPHAGEAL ECHOCARDIOGRAM (TEE);  Surgeon: Sande Rives, MD;  Location: [facility] ENDOSCOPY;  Service: Cardiovascular;  Laterality: N/A;   TONSILLECTOMY     TOTAL KNEE ARTHROPLASTY  08/22/2011   Procedure: TOTAL KNEE ARTHROPLASTY;  Surgeon: Loanne Drilling, MD;  Location: [facility] ORS;  Service: Orthopedics;  Laterality: Right;    Home Medications:  Allergies as of 08/14/2023   No Known Allergies      Medication List        Accurate as of August 14, 2023  2:14 PM. If you have any questions, ask your nurse or doctor.          acetaminophen 500 MG tablet Commonly known as: TYLENOL Take 1,000 mg by mouth every 6 (six) hours as needed for mild pain (pain score 1-3).   ALPRAZolam 0.5 MG tablet Commonly known as: XANAX Take 0.5 mg at bedtime by mouth. Anxiety   aspirin EC 81 MG tablet Take 1 tablet (81 mg total) by mouth daily. Swallow whole.   bacitracin-polymyxin b ointment Commonly known as: POLYSPORIN Apply 1 Application topically 2 (two) times daily.   brimonidine 0.2 % ophthalmic solution Commonly known as: ALPHAGAN Place 1 drop into the left eye in the morning and at bedtime.   carboxymethylcellulose 0.5 % Soln Commonly known as: REFRESH PLUS Place 1 drop into the right eye daily as needed (dry eyes).   celecoxib 200 MG capsule Commonly known as: CELEBREX Take 200 mg by mouth 2 (two) times daily.   cyanocobalamin 500 MCG tablet Commonly known as: VITAMIN B12 Take 500 mcg by mouth daily.   docusate sodium 100 MG capsule Commonly known as: COLACE Take 100 mg by mouth daily.   FIBER-CAPS PO Take 1 capsule by mouth daily.   finasteride 5 MG tablet Commonly known as: PROSCAR Take 1 tablet (5 mg total) by mouth daily.   furosemide 40 MG tablet Commonly known as: LASIX Take 1 tablet (40 mg total) by mouth daily.   ibuprofen 100 MG  tablet Commonly known as: ADVIL Take 200 mg by mouth every 6 (six) hours as needed for fever.   metoprolol tartrate 25 MG tablet Commonly known as: LOPRESSOR Take 0.5 tablets (12.5 mg total) by mouth 2 (two) times daily.   NON FORMULARY at bedtime. CPAP   potassium chloride 10 MEQ tablet Commonly known as: KLOR-CON Take 10 mEq by mouth daily.   simvastatin 40 MG tablet Commonly known as: ZOCOR Take 40 mg by mouth at bedtime.   tamsulosin 0.4 MG Caps capsule Commonly known as: FLOMAX Take 1 capsule (0.4 mg total) by mouth in the morning and at bedtime.   TART CHERRY ADVANCED PO Take 1 tablet by mouth daily.   vitamin C 1000 MG tablet Take 1,000 mg by mouth daily.   Vitamin D3 50 MCG (2000 UT) Tabs Take 4,000 Units by mouth daily.  Zinc 50 MG Tabs Take 50 mg by mouth daily.        Allergies: No Known Allergies  Family History: Family History  Problem Relation Age of Onset   Heart failure Mother        also arthritis   Stroke Father        also emphysema   Heart failure Father     Social History:  reports that he has never smoked. He has never used smokeless tobacco. He reports that he does not drink alcohol and does not use drugs.  ROS: All other review of systems were reviewed and are negative except what is noted above in HPI  Physical Exam: BP 126/63   Pulse 84   Constitutional:  Alert and oriented, No acute distress. HEENT: [place] AT, moist mucus membranes.  Trachea midline, no masses. Cardiovascular: No clubbing, cyanosis, or edema. Respiratory: Normal respiratory effort, no increased work of breathing. GI: Abdomen is soft, nontender, nondistended, no abdominal masses GU: No CVA tenderness.  Lymph: No cervical or inguinal lymphadenopathy. Skin: No rashes, bruises or suspicious lesions. Neurologic: Grossly intact, no focal deficits, moving all 4 extremities. Psychiatric: Normal mood and affect.  Laboratory Data: Lab Results  Component Value Date    WBC 5.8 07/20/2023   HGB 12.5 (L) 07/20/2023   HCT 38.0 (L) 07/20/2023   MCV 89.2 07/20/2023   PLT 102 (L) 07/20/2023    Lab Results  Component Value Date   CREATININE 0.90 07/20/2023    No results found for: "PSA"  No results found for: "TESTOSTERONE"  No results found for: "HGBA1C"  Urinalysis    Component Value Date/Time   COLORURINE YELLOW 06/26/2018 1045   APPEARANCEUR Clear 08/19/2022 1015   LABSPEC 1.021 06/26/2018 1045   PHURINE 6.0 06/26/2018 1045   GLUCOSEU Negative 08/19/2022 1015   HGBUR SMALL (A) 06/26/2018 1045   BILIRUBINUR Negative 08/19/2022 1015   KETONESUR NEGATIVE 06/26/2018 1045   PROTEINUR Negative 08/19/2022 1015   PROTEINUR NEGATIVE 06/26/2018 1045   UROBILINOGEN 0.2 08/14/2019 1151   UROBILINOGEN 0.2 08/15/2011 1248   NITRITE Negative 08/19/2022 1015   NITRITE POSITIVE (A) 06/26/2018 1045   LEUKOCYTESUR Negative 08/19/2022 1015    Lab Results  Component Value Date   LABMICR Comment 08/19/2022   BACTERIA MANY (A) 06/26/2018    Pertinent Imaging:  No results found for this or any previous visit.  No results found for this or any previous visit.  No results found for this or any previous visit.  No results found for this or any previous visit.  No results found for this or any previous visit.  No results found for this or any previous visit.  No results found for this or any previous visit.  No results found for this or any previous visit.   Assessment & Plan:    1. Benign localized prostatic hyperplasia with lower urinary tract symptoms (LUTS) (Primary) Continue flomax 0.4mg  BID and finasteride 5mg  daily - Urinalysis, Routine w reflex microscopic  2. Nocturia Continue finasteride 5mg  daily and flomax 0.4mg  BID  3. Erectile dysfunction due to arterial insufficiency We will trial tadalafil 20mg  prn   No follow-ups on file.  Wilkie Aye, MD  [facility] [place]

## 2023-08-14 NOTE — Patient Instructions (Signed)

## 2023-08-15 ENCOUNTER — Encounter: Payer: Self-pay | Admitting: Urology

## 2023-08-15 DIAGNOSIS — M79672 Pain in left foot: Secondary | ICD-10-CM | POA: Diagnosis not present

## 2023-08-15 DIAGNOSIS — L600 Ingrowing nail: Secondary | ICD-10-CM | POA: Diagnosis not present

## 2023-08-15 DIAGNOSIS — M25561 Pain in right knee: Secondary | ICD-10-CM | POA: Diagnosis not present

## 2023-08-15 DIAGNOSIS — L03032 Cellulitis of left toe: Secondary | ICD-10-CM | POA: Diagnosis not present

## 2023-08-15 DIAGNOSIS — M79675 Pain in left toe(s): Secondary | ICD-10-CM | POA: Diagnosis not present

## 2023-08-15 LAB — URINALYSIS, ROUTINE W REFLEX MICROSCOPIC
Bilirubin, UA: NEGATIVE
Glucose, UA: NEGATIVE
Ketones, UA: NEGATIVE
Leukocytes,UA: NEGATIVE
Nitrite, UA: NEGATIVE
Protein,UA: NEGATIVE
RBC, UA: NEGATIVE
Specific Gravity, UA: 1.02 (ref 1.005–1.030)
Urobilinogen, Ur: 4 mg/dL — ABNORMAL HIGH (ref 0.2–1.0)
pH, UA: 6.5 (ref 5.0–7.5)

## 2023-08-16 DIAGNOSIS — G4733 Obstructive sleep apnea (adult) (pediatric): Secondary | ICD-10-CM | POA: Diagnosis not present

## 2023-08-18 DIAGNOSIS — M25561 Pain in right knee: Secondary | ICD-10-CM | POA: Diagnosis not present

## 2023-08-21 DIAGNOSIS — M25561 Pain in right knee: Secondary | ICD-10-CM | POA: Diagnosis not present

## 2023-08-24 DIAGNOSIS — L110 Acquired keratosis follicularis: Secondary | ICD-10-CM | POA: Diagnosis not present

## 2023-08-24 DIAGNOSIS — B351 Tinea unguium: Secondary | ICD-10-CM | POA: Diagnosis not present

## 2023-08-24 DIAGNOSIS — I739 Peripheral vascular disease, unspecified: Secondary | ICD-10-CM | POA: Diagnosis not present

## 2023-08-24 DIAGNOSIS — M25561 Pain in right knee: Secondary | ICD-10-CM | POA: Diagnosis not present

## 2023-08-28 DIAGNOSIS — M25561 Pain in right knee: Secondary | ICD-10-CM | POA: Diagnosis not present

## 2023-08-31 DIAGNOSIS — M25561 Pain in right knee: Secondary | ICD-10-CM | POA: Diagnosis not present

## 2023-09-04 DIAGNOSIS — I110 Hypertensive heart disease with heart failure: Secondary | ICD-10-CM | POA: Diagnosis not present

## 2023-09-04 DIAGNOSIS — I4819 Other persistent atrial fibrillation: Secondary | ICD-10-CM | POA: Diagnosis not present

## 2023-09-05 DIAGNOSIS — M25561 Pain in right knee: Secondary | ICD-10-CM | POA: Diagnosis not present

## 2023-09-07 DIAGNOSIS — M25561 Pain in right knee: Secondary | ICD-10-CM | POA: Diagnosis not present

## 2023-09-12 DIAGNOSIS — M25561 Pain in right knee: Secondary | ICD-10-CM | POA: Diagnosis not present

## 2023-09-14 DIAGNOSIS — M25561 Pain in right knee: Secondary | ICD-10-CM | POA: Diagnosis not present

## 2023-09-19 DIAGNOSIS — M25561 Pain in right knee: Secondary | ICD-10-CM | POA: Diagnosis not present

## 2023-09-20 DIAGNOSIS — L570 Actinic keratosis: Secondary | ICD-10-CM | POA: Diagnosis not present

## 2023-09-20 DIAGNOSIS — D485 Neoplasm of uncertain behavior of skin: Secondary | ICD-10-CM | POA: Diagnosis not present

## 2023-09-20 DIAGNOSIS — C44619 Basal cell carcinoma of skin of left upper limb, including shoulder: Secondary | ICD-10-CM | POA: Diagnosis not present

## 2023-09-20 DIAGNOSIS — D2361 Other benign neoplasm of skin of right upper limb, including shoulder: Secondary | ICD-10-CM | POA: Diagnosis not present

## 2023-09-21 DIAGNOSIS — M25561 Pain in right knee: Secondary | ICD-10-CM | POA: Diagnosis not present

## 2023-09-25 DIAGNOSIS — M25561 Pain in right knee: Secondary | ICD-10-CM | POA: Diagnosis not present

## 2023-09-28 DIAGNOSIS — M25561 Pain in right knee: Secondary | ICD-10-CM | POA: Diagnosis not present

## 2023-10-03 DIAGNOSIS — M25561 Pain in right knee: Secondary | ICD-10-CM | POA: Diagnosis not present

## 2023-10-05 DIAGNOSIS — M25561 Pain in right knee: Secondary | ICD-10-CM | POA: Diagnosis not present

## 2023-10-05 DIAGNOSIS — C44619 Basal cell carcinoma of skin of left upper limb, including shoulder: Secondary | ICD-10-CM | POA: Diagnosis not present

## 2023-10-10 DIAGNOSIS — M25561 Pain in right knee: Secondary | ICD-10-CM | POA: Diagnosis not present

## 2023-10-12 DIAGNOSIS — M25561 Pain in right knee: Secondary | ICD-10-CM | POA: Diagnosis not present

## 2023-10-16 DIAGNOSIS — M25561 Pain in right knee: Secondary | ICD-10-CM | POA: Diagnosis not present

## 2023-10-18 DIAGNOSIS — M25561 Pain in right knee: Secondary | ICD-10-CM | POA: Diagnosis not present

## 2023-10-24 DIAGNOSIS — M25561 Pain in right knee: Secondary | ICD-10-CM | POA: Diagnosis not present

## 2023-10-26 DIAGNOSIS — M25561 Pain in right knee: Secondary | ICD-10-CM | POA: Diagnosis not present

## 2023-11-02 DIAGNOSIS — L110 Acquired keratosis follicularis: Secondary | ICD-10-CM | POA: Diagnosis not present

## 2023-11-02 DIAGNOSIS — I739 Peripheral vascular disease, unspecified: Secondary | ICD-10-CM | POA: Diagnosis not present

## 2023-11-02 DIAGNOSIS — B351 Tinea unguium: Secondary | ICD-10-CM | POA: Diagnosis not present

## 2023-11-23 ENCOUNTER — Encounter (INDEPENDENT_AMBULATORY_CARE_PROVIDER_SITE_OTHER): Payer: Medicare Other | Admitting: Ophthalmology

## 2023-11-23 DIAGNOSIS — D3131 Benign neoplasm of right choroid: Secondary | ICD-10-CM | POA: Diagnosis not present

## 2023-11-23 DIAGNOSIS — H35373 Puckering of macula, bilateral: Secondary | ICD-10-CM | POA: Diagnosis not present

## 2023-11-23 DIAGNOSIS — H35033 Hypertensive retinopathy, bilateral: Secondary | ICD-10-CM | POA: Diagnosis not present

## 2023-11-23 DIAGNOSIS — I10 Essential (primary) hypertension: Secondary | ICD-10-CM

## 2023-11-23 DIAGNOSIS — H338 Other retinal detachments: Secondary | ICD-10-CM

## 2023-11-23 DIAGNOSIS — H43811 Vitreous degeneration, right eye: Secondary | ICD-10-CM

## 2023-12-04 DIAGNOSIS — I2510 Atherosclerotic heart disease of native coronary artery without angina pectoris: Secondary | ICD-10-CM | POA: Diagnosis not present

## 2023-12-04 DIAGNOSIS — I4819 Other persistent atrial fibrillation: Secondary | ICD-10-CM | POA: Diagnosis not present

## 2023-12-04 DIAGNOSIS — I10 Essential (primary) hypertension: Secondary | ICD-10-CM | POA: Diagnosis not present

## 2023-12-04 DIAGNOSIS — I110 Hypertensive heart disease with heart failure: Secondary | ICD-10-CM | POA: Diagnosis not present

## 2023-12-04 DIAGNOSIS — I5030 Unspecified diastolic (congestive) heart failure: Secondary | ICD-10-CM | POA: Diagnosis not present

## 2023-12-06 DIAGNOSIS — Z6837 Body mass index (BMI) 37.0-37.9, adult: Secondary | ICD-10-CM | POA: Diagnosis not present

## 2023-12-06 DIAGNOSIS — E6609 Other obesity due to excess calories: Secondary | ICD-10-CM | POA: Diagnosis not present

## 2023-12-06 DIAGNOSIS — F419 Anxiety disorder, unspecified: Secondary | ICD-10-CM | POA: Diagnosis not present

## 2023-12-11 ENCOUNTER — Telehealth: Payer: Self-pay | Admitting: Internal Medicine

## 2023-12-11 NOTE — Telephone Encounter (Signed)
 Pt was seeing Dr Mona in [place] but since he is not coming to [place] anymore pt would like to switch to Dr Debera in  [place] because it is closer. Please advise

## 2024-01-11 DIAGNOSIS — L110 Acquired keratosis follicularis: Secondary | ICD-10-CM | POA: Diagnosis not present

## 2024-01-11 DIAGNOSIS — I739 Peripheral vascular disease, unspecified: Secondary | ICD-10-CM | POA: Diagnosis not present

## 2024-01-11 DIAGNOSIS — B351 Tinea unguium: Secondary | ICD-10-CM | POA: Diagnosis not present

## 2024-01-17 DIAGNOSIS — Z1331 Encounter for screening for depression: Secondary | ICD-10-CM | POA: Diagnosis not present

## 2024-01-17 DIAGNOSIS — G4730 Sleep apnea, unspecified: Secondary | ICD-10-CM | POA: Diagnosis not present

## 2024-01-17 DIAGNOSIS — N1831 Chronic kidney disease, stage 3a: Secondary | ICD-10-CM | POA: Diagnosis not present

## 2024-01-17 DIAGNOSIS — F419 Anxiety disorder, unspecified: Secondary | ICD-10-CM | POA: Diagnosis not present

## 2024-01-17 DIAGNOSIS — E6609 Other obesity due to excess calories: Secondary | ICD-10-CM | POA: Diagnosis not present

## 2024-01-17 DIAGNOSIS — I4819 Other persistent atrial fibrillation: Secondary | ICD-10-CM | POA: Diagnosis not present

## 2024-01-17 DIAGNOSIS — Z0001 Encounter for general adult medical examination with abnormal findings: Secondary | ICD-10-CM | POA: Diagnosis not present

## 2024-01-17 DIAGNOSIS — D696 Thrombocytopenia, unspecified: Secondary | ICD-10-CM | POA: Diagnosis not present

## 2024-01-17 DIAGNOSIS — I10 Essential (primary) hypertension: Secondary | ICD-10-CM | POA: Diagnosis not present

## 2024-01-17 DIAGNOSIS — Z6836 Body mass index (BMI) 36.0-36.9, adult: Secondary | ICD-10-CM | POA: Diagnosis not present

## 2024-01-17 DIAGNOSIS — I110 Hypertensive heart disease with heart failure: Secondary | ICD-10-CM | POA: Diagnosis not present

## 2024-01-17 DIAGNOSIS — I2510 Atherosclerotic heart disease of native coronary artery without angina pectoris: Secondary | ICD-10-CM | POA: Diagnosis not present

## 2024-01-20 ENCOUNTER — Other Ambulatory Visit: Payer: Self-pay | Admitting: Cardiology

## 2024-01-29 DIAGNOSIS — Z961 Presence of intraocular lens: Secondary | ICD-10-CM | POA: Diagnosis not present

## 2024-01-29 DIAGNOSIS — H40021 Open angle with borderline findings, high risk, right eye: Secondary | ICD-10-CM | POA: Diagnosis not present

## 2024-01-29 DIAGNOSIS — H401122 Primary open-angle glaucoma, left eye, moderate stage: Secondary | ICD-10-CM | POA: Diagnosis not present

## 2024-02-14 DIAGNOSIS — K080 Exfoliation of teeth due to systemic causes: Secondary | ICD-10-CM | POA: Diagnosis not present

## 2024-03-13 ENCOUNTER — Encounter: Payer: Self-pay | Admitting: Cardiology

## 2024-03-13 ENCOUNTER — Ambulatory Visit: Attending: Cardiology | Admitting: Cardiology

## 2024-03-13 VITALS — BP 124/60 | HR 63 | Ht 66.0 in | Wt 238.8 lb

## 2024-03-13 DIAGNOSIS — I890 Lymphedema, not elsewhere classified: Secondary | ICD-10-CM

## 2024-03-13 DIAGNOSIS — I350 Nonrheumatic aortic (valve) stenosis: Secondary | ICD-10-CM

## 2024-03-13 DIAGNOSIS — I4821 Permanent atrial fibrillation: Secondary | ICD-10-CM | POA: Diagnosis not present

## 2024-03-13 DIAGNOSIS — E782 Mixed hyperlipidemia: Secondary | ICD-10-CM | POA: Diagnosis not present

## 2024-03-13 NOTE — Progress Notes (Signed)
    Cardiology Office Note  Date: 03/13/2024   ID: Paul Lam, DOB 07/05/1940, MRN [id number]  History of Present Illness: Paul Lam is an 83 y.o. male former patient of Dr. Mona now presenting to establish follow-up with me.  I reviewed his records.  His last visit was in May 2024.  He is here today with his wife for a routine visit.  He reports a rare sense of palpitations, nothing prolonged.  No exertional chest pain and stable NYHA class II dyspnea.  He has chronic leg edema, examination consistent with lymphedema.  We went over his medications.  He does not report any obvious intolerances.  I reviewed his lab work from August at which point LDL was 39.  Creatinine 0.97.  His PCP Dr. Marvine has moved locations and he and his wife are looking for a new PCP, most likely [facility].  I reviewed his ECG today which shows atrial fibrillation at 63 bpm.  TEE in 2022 at time of watchman implantation revealed LVEF 55 to 60% with normal RV contraction.  He did have mild aortic stenosis with mean AV gradient approximately 12 mmHg.  Physical Exam: VS:  BP 124/60 (BP Location: Left Arm)   Pulse 63   Ht 5' 6 (1.676 m)   Wt 238 lb 12.8 oz (108.3 kg)   SpO2 100%   BMI 38.54 kg/m , BMI Body mass index is 38.54 kg/m.  Wt Readings from Last 3 Encounters:  03/13/24 238 lb 12.8 oz (108.3 kg)  07/19/23 210 lb 1.6 oz (95.3 kg)  07/15/23 210 lb 1.6 oz (95.3 kg)    General: Patient appears comfortable at rest. HEENT: Conjunctiva and lids normal. Neck: Supple, no elevated JVP or carotid bruits. Lungs: Clear to auscultation, nonlabored breathing at rest. Cardiac: Irregularly irregular with 2/6 systolic murmur. Abdomen: Soft, bowel sounds present. Extremities: Lymphedema bilaterally.  ECG:  An ECG dated 10/28/2022 was personally reviewed today and demonstrated:  Rate controlled atrial fibrillation with PVCs.  Labwork: 07/20/2023: BUN 19; Creatinine, Ser 0.90; Hemoglobin  12.5; Platelets 102; Potassium 4.4; Sodium 138  August 2025: Hemoglobin 13.6, platelets 102, BUN 17, creatinine 0.97, potassium 4.6, AST 17, ALT 8, cholesterol 98, triglycerides 55, HDL 46, LDL 39, TSH 1.71  Other Studies Reviewed Today:  No interval cardiac testing for review today.  Assessment and Plan:  1.  Permanent atrial fibrillation with CHA2DS2-VASc score of 4 status post Watchman implantation in 2022.  He does not report any progressive palpitations and has good heart rate control on Lopressor  12.5 mg twice daily.  He is no longer anticoagulated and remains on aspirin  81 mg daily.  2.  Primary hypertension.  Blood pressure is well-controlled today.  No changes made to current regimen.  3.  Mixed hyperlipidemia.  LDL 39 and HDL 46 in August.  Continue Zocor  40 mg daily.  4.  OSA on CPAP.  He reports compliance with treatment.  5.  Aortic stenosis.  Mild by echocardiogram in 2022 with mean AV gradient approximately 12 mmHg.  Follow-up echocardiogram for surveillance.  6.  Chronic lymphedema.  He takes Lasix  40 mg daily with potassium supplement.  Referring for PT evaluation regarding mechanical compression.  Disposition:  Follow up 6 months.  Signed, Jayson JUDITHANN Sierras, M.D., F.A.C.C. [facility]

## 2024-03-13 NOTE — Patient Instructions (Addendum)
 Medication Instructions:  Your physician recommends that you continue on your current medications as directed. Please refer to the Current Medication list given to you today.   Labwork: None today  Testing/Procedures: Your physician has requested that you have an echocardiogram. Echocardiography is a painless test that uses sound waves to create images of your heart. It provides your doctor with information about the size and shape of your heart and how well your heart's chambers and valves are working. This procedure takes approximately one hour. There are no restrictions for this procedure. Please do NOT wear cologne, perfume, aftershave, or lotions (deodorant is allowed). Please arrive 15 minutes prior to your appointment time.  Please note: We ask at that you not bring children with you during ultrasound (echo/ vascular) testing. Due to room size and safety concerns, children are not allowed in the ultrasound rooms during exams. Our front office staff cannot provide observation of children in our lobby area while testing is being conducted. An adult accompanying a patient to their appointment will only be allowed in the ultrasound room at the discretion of the ultrasound technician under special circumstances. We apologize for any inconvenience.  Follow-Up: 6 months  Any Other Special Instructions Will Be Listed Below (If Applicable).   Referral to Physical Therapy for Lymphedema clinic.They will call you to schedule an appointment.   If you need a refill on your cardiac medications before your next appointment, please call your pharmacy.

## 2024-03-26 ENCOUNTER — Ambulatory Visit: Attending: Cardiology

## 2024-03-26 DIAGNOSIS — I350 Nonrheumatic aortic (valve) stenosis: Secondary | ICD-10-CM | POA: Diagnosis not present

## 2024-03-27 ENCOUNTER — Ambulatory Visit: Payer: Self-pay | Admitting: Cardiology

## 2024-03-27 LAB — ECHOCARDIOGRAM COMPLETE
AR max vel: 1.33 cm2
AV Area VTI: 1.38 cm2
AV Area mean vel: 1.41 cm2
AV Mean grad: 20 mmHg
AV Peak grad: 38.2 mmHg
AV Vena cont: 0.5 cm
Ao pk vel: 3.09 m/s
Calc EF: 62 %
MV VTI: 1.88 cm2
P 1/2 time: 851 ms
S' Lateral: 3.1 cm
Single Plane A2C EF: 59.1 %
Single Plane A4C EF: 65.5 %

## 2024-04-16 ENCOUNTER — Ambulatory Visit (INDEPENDENT_AMBULATORY_CARE_PROVIDER_SITE_OTHER): Admitting: Physician Assistant

## 2024-04-16 ENCOUNTER — Encounter: Payer: Self-pay | Admitting: Physician Assistant

## 2024-04-16 VITALS — BP 120/75 | HR 62 | Temp 98.1°F | Ht 66.0 in | Wt 248.5 lb

## 2024-04-16 DIAGNOSIS — Z7689 Persons encountering health services in other specified circumstances: Secondary | ICD-10-CM

## 2024-04-16 DIAGNOSIS — M5416 Radiculopathy, lumbar region: Secondary | ICD-10-CM | POA: Insufficient documentation

## 2024-04-16 DIAGNOSIS — I10 Essential (primary) hypertension: Secondary | ICD-10-CM | POA: Diagnosis not present

## 2024-04-16 DIAGNOSIS — I4811 Longstanding persistent atrial fibrillation: Secondary | ICD-10-CM | POA: Diagnosis not present

## 2024-04-16 DIAGNOSIS — I5032 Chronic diastolic (congestive) heart failure: Secondary | ICD-10-CM

## 2024-04-16 DIAGNOSIS — E782 Mixed hyperlipidemia: Secondary | ICD-10-CM | POA: Insufficient documentation

## 2024-04-16 DIAGNOSIS — M1711 Unilateral primary osteoarthritis, right knee: Secondary | ICD-10-CM

## 2024-04-16 DIAGNOSIS — I5030 Unspecified diastolic (congestive) heart failure: Secondary | ICD-10-CM | POA: Insufficient documentation

## 2024-04-16 DIAGNOSIS — R413 Other amnesia: Secondary | ICD-10-CM | POA: Insufficient documentation

## 2024-04-16 DIAGNOSIS — N401 Enlarged prostate with lower urinary tract symptoms: Secondary | ICD-10-CM

## 2024-04-16 MED ORDER — SIMVASTATIN 40 MG PO TABS: 40.0000 mg | ORAL_TABLET | Freq: Every day | ORAL | 1 refills | Status: AC

## 2024-04-16 MED ORDER — CELECOXIB 200 MG PO CAPS: 200.0000 mg | ORAL_CAPSULE | Freq: Two times a day (BID) | ORAL | 1 refills | Status: AC

## 2024-04-16 MED ORDER — POTASSIUM CHLORIDE ER 10 MEQ PO TBCR: 10.0000 meq | EXTENDED_RELEASE_TABLET | Freq: Every day | ORAL | 1 refills | Status: AC

## 2024-04-16 MED ORDER — DONEPEZIL HCL 5 MG PO TABS: 5.0000 mg | ORAL_TABLET | Freq: Every day | ORAL | 1 refills | Status: AC

## 2024-04-16 MED ORDER — METOPROLOL TARTRATE 25 MG PO TABS: 12.5000 mg | ORAL_TABLET | Freq: Two times a day (BID) | ORAL | 1 refills | Status: AC

## 2024-04-16 MED ORDER — GABAPENTIN 100 MG PO CAPS: 100.0000 mg | ORAL_CAPSULE | Freq: Every day | ORAL | 1 refills | Status: AC

## 2024-04-16 NOTE — Assessment & Plan Note (Signed)
 Stable. Continue with current management without changes. Discussed healthy diet and lifestyle.

## 2024-04-16 NOTE — Assessment & Plan Note (Signed)
 Stable. Medications refilled. BMP today before refilling potassium, while awaiting [facility] medication records. Continue current treatment plan. Follow up with cardiology as scheduled.

## 2024-04-16 NOTE — Assessment & Plan Note (Signed)
 Stable. Medications refilled. Follow up with urology as scheduled.

## 2024-04-16 NOTE — Progress Notes (Signed)
 New Patient Office Visit  Subjective    Patient ID: Paul Lam, male    DOB: 1940-10-25  Age: 83 y.o. MRN: [id number]  CC:  Chief Complaint  Patient presents with   New Patient (Initial Visit)    Patient is a new patient.  He has had low back pain for the past 4 months. He did have a shot in the back for pain. He is also having right knee issues as well.  Patients wide stated that he is having memory loss    HPI Paul Lam presents to establish care  Discussed the use of AI scribe software for clinical note transcription with the patient, who gave verbal consent to proceed.  History of Present Illness Paul Lam is an 83 year old male who presents for establishing care with back pain and memory problems.  He experiences significant back pain, which persists despite a back injection on April 02, 2024, that provided some relief. He takes Celebrex  for back and knee pain. Memory problems are ongoing, with no specific diagnostic workup or treatment mentioned.  He has experienced significant emotional distress following the suicide of his grandson and related family strain, contributing to ongoing emotional difficulty.  Past medical history significant for hypertension, hyperlipidemia, memory deficits, A-fib, diastolic heart failure, BPH, anxiety, and osteoarthritis. He follow with cardiology, urology, and ophthalmology. He uses a CPAP machine from sleep apnea. No recent falls. Awaiting [facility] medical records.     Outpatient Encounter Medications as of 04/16/2024  Medication Sig   ALPRAZolam  (XANAX ) 0.5 MG tablet Take 0.5 mg at bedtime by mouth. Anxiety    ascorbic acid (VITAMIN C) 500 MG tablet Take 500 mg by mouth daily.   aspirin  EC 81 MG tablet Take 1 tablet (81 mg total) by mouth daily. Swallow whole.   Cholecalciferol (VITAMIN D3) 50 MCG (2000 UT) capsule Take 2,000 Units by mouth daily.   docusate sodium  (COLACE) 100 MG capsule Take 100 mg by mouth 2 (two)  times daily.   finasteride  (PROSCAR ) 5 MG tablet Take 1 tablet (5 mg total) by mouth daily.   furosemide  (LASIX ) 40 MG tablet take 1 tablet (40 MILLIGRAM total) by mouth daily.   Misc Natural Products (TART CHERRY ADVANCED) CAPS Take 1 capsule by mouth once. 850 MG & 0.8% Anthocyanins   psyllium (METAMUCIL) 58.6 % powder Take 1 packet by mouth 3 (three) times daily. 100 mg Psyllium Husk   tamsulosin  (FLOMAX ) 0.4 MG CAPS capsule Take 1 capsule (0.4 mg total) by mouth in the morning and at bedtime.   Zinc  50 MG TABS Take 50 mg by mouth daily.   [DISCONTINUED] celecoxib  (CELEBREX ) 200 MG capsule Take 200 mg by mouth 2 (two) times daily.   [DISCONTINUED] donepezil (ARICEPT) 5 MG tablet Take 5 mg by mouth at bedtime.   [DISCONTINUED] gabapentin (NEURONTIN) 100 MG capsule Take 100 mg by mouth 2 (two) times daily.   [DISCONTINUED] metoprolol  tartrate (LOPRESSOR ) 25 MG tablet Take 0.5 tablets (12.5 mg total) by mouth 2 (two) times daily.   [DISCONTINUED] potassium chloride  (KLOR-CON ) 10 MEQ tablet Take 10 mEq by mouth daily.   [DISCONTINUED] simvastatin  (ZOCOR ) 40 MG tablet Take 40 mg by mouth at bedtime.   bacitracin -polymyxin b  (POLYSPORIN ) ointment Apply 1 Application topically 2 (two) times daily. (Patient taking differently: Apply 1 Application topically daily as needed.)   brimonidine  (ALPHAGAN ) 0.2 % ophthalmic solution Place 1 drop into the left eye in the morning and at bedtime.   Calcium  Polycarbophil (FIBER-CAPS PO) Take 1 capsule by mouth daily.   carboxymethylcellulose (REFRESH PLUS) 0.5 % SOLN Place 1 drop into the right eye daily as needed (dry eyes).   celecoxib  (CELEBREX ) 200 MG capsule Take 1 capsule (200 mg total) by mouth 2 (two) times daily.   cyanocobalamin  (VITAMIN B12) 500 MCG tablet Take 500 mcg by mouth daily.   donepezil (ARICEPT) 5 MG tablet Take 1 tablet (5 mg total) by mouth at bedtime.   gabapentin (NEURONTIN) 100 MG capsule Take 1 capsule (100 mg total) by mouth at  bedtime.   ibuprofen (ADVIL) 100 MG tablet Take 200 mg by mouth every 6 (six) hours as needed for fever.   metoprolol  tartrate (LOPRESSOR ) 25 MG tablet Take 0.5 tablets (12.5 mg total) by mouth 2 (two) times daily.   Misc Natural Products (TART CHERRY ADVANCED PO) Take 1 tablet by mouth daily.    NON FORMULARY at bedtime. CPAP   potassium chloride  (KLOR-CON ) 10 MEQ tablet Take 1 tablet (10 mEq total) by mouth daily.   simvastatin  (ZOCOR ) 40 MG tablet Take 1 tablet (40 mg total) by mouth at bedtime.   [DISCONTINUED] acetaminophen  (TYLENOL ) 500 MG tablet Take 1,000 mg by mouth every 6 (six) hours as needed for mild pain (pain score 1-3). (Patient not taking: Reported on 03/13/2024)   [DISCONTINUED] Ascorbic Acid (VITAMIN C) 1000 MG tablet Take 1,000 mg by mouth daily.   [DISCONTINUED] Cholecalciferol (VITAMIN D3) 50 MCG (2000 UT) TABS Take 4,000 Units by mouth daily.   [DISCONTINUED] docusate sodium  (COLACE) 100 MG capsule Take 100 mg by mouth daily.   [DISCONTINUED] gabapentin (NEURONTIN) 100 MG capsule Take 100 mg by mouth daily.   [DISCONTINUED] tadalafil  (CIALIS ) 20 MG tablet Take 1 tablet (20 mg total) by mouth as needed. (Patient not taking: Reported on 03/13/2024)   No facility-administered encounter medications on file as of 04/16/2024.    Past Medical History:  Diagnosis Date   Anginal pain    Anxiety    Arthritis    generalized arthritis    Atrial fibrillation (HCC)    CAD (coronary artery disease)    Cancer (HCC)    skin cancer removed left ear     Complication of anesthesia    Depression    Headache    History of echocardiogram 02/19/2009   EF 50-55%; LA mild-mod dilated;    History of kidney stones    History of nuclear stress test 02/19/2009   low risk, normal    Hyperlipidemia    Hypertension    Obesity (BMI 30-39.9)    OSA (obstructive sleep apnea)    severe with AHI 25/hr now on BiPAP   PONV (postoperative nausea and vomiting)    Retinal detachment    with  proliferative vitreoretinopathy   Shortness of breath    related to weight    Sleep apnea    Vertigo     Past Surgical History:  Procedure Laterality Date   APPENDECTOMY     BIOPSY  03/05/2020   Procedure: BIOPSY;  Surgeon: Golda Claudis PENNER, MD;  Location: [facility];  Service: Endoscopy;;   CARDIAC CATHETERIZATION  07/13/2005   noncritical CAD   CARDIOVERSION N/A 01/29/2018   Procedure: CARDIOVERSION;  Surgeon: Maranda Leim DEL, MD;  Location: [facility];  Service: Cardiovascular;  Laterality: N/A;   COLONOSCOPY WITH PROPOFOL  N/A 03/06/2020   Procedure: COLONOSCOPY WITH PROPOFOL ;  Surgeon: Eartha Angelia Sieving, MD;  Location: [facility];  Service: Gastroenterology;  Laterality: N/A;  ESOPHAGOGASTRODUODENOSCOPY (EGD) WITH PROPOFOL  N/A 03/05/2020   Procedure: ESOPHAGOGASTRODUODENOSCOPY (EGD) WITH PROPOFOL ;  Surgeon: Golda Claudis PENNER, MD;  Location: [facility];  Service: Endoscopy;  Laterality: N/A;   EYE SURGERY     FLEXIBLE SIGMOIDOSCOPY N/A 07/14/2015   Procedure: FLEXIBLE SIGMOIDOSCOPY;  Surgeon: Oneil Budge, MD;  Location: [facility];  Service: Gastroenterology;  Laterality: N/A;   GIVENS CAPSULE STUDY N/A 04/15/2020   Procedure: GIVENS CAPSULE STUDY;  Surgeon: Eartha Angelia Sieving, MD;  Location: [facility];  Service: Gastroenterology;  Laterality: N/A;  730   JOINT REPLACEMENT     LEFT ATRIAL APPENDAGE OCCLUSION N/A 07/09/2020   Procedure: LEFT ATRIAL APPENDAGE OCCLUSION;  Surgeon: Wonda Sharper, MD;  Location: [facility] INVASIVE CV LAB;  Service: Cardiovascular;  Laterality: N/A;   NASAL SEPTOPLASTY W/ TURBINOPLASTY Bilateral 12/19/2017   Procedure: NASAL SEPTOPLASTY WITH TURBINATE REDUCTION;  Surgeon: Karis Clunes, MD;  Location: [facility];  Service: ENT;  Laterality: Bilateral;   NASAL SINUS SURGERY     PARS PLANA VITRECTOMY Left 06/20/2017   Procedure: PARS PLANA VITRECTOMY WITH 25 GAUGE, REPAIR OF COMPLEX TRACTION RETINAL DETACHMENT, MEMBRANE PEEEL;   Surgeon: Alvia Norleen BIRCH, MD;  Location: [facility] OR;  Service: Ophthalmology;  Laterality: Left;   POLYPECTOMY  03/06/2020   Procedure: POLYPECTOMY;  Surgeon: Eartha Angelia Sieving, MD;  Location: [facility];  Service: Gastroenterology;;   REPAIR OF COMPLEX TRACTION RETINAL DETACHMENT Left 06/20/2017   RETINAL DETACHMENT SURGERY Right 04/2011   SCLERAL BUCKLE WITH POSSIBLE 25 GAUGE PARS PLANA VITRECTOMY Left 05/22/2017   Procedure: SCLERAL BUCKLE, LASER, GAS INJECTION LEFT EYE;  Surgeon: Alvia Norleen BIRCH, MD;  Location: [facility];  Service: Ophthalmology;  Laterality: Left;   SKIN CANCER EXCISION Left 2013   left ear skin ca removed  2 weeks ago - not completeely healed 08/15/11    TEE WITHOUT CARDIOVERSION N/A 07/09/2020   Procedure: TRANSESOPHAGEAL ECHOCARDIOGRAM (TEE);  Surgeon: Wonda Sharper, MD;  Location: [facility] INVASIVE CV LAB;  Service: Cardiovascular;  Laterality: N/A;   TEE WITHOUT CARDIOVERSION N/A 08/31/2020   Procedure: TRANSESOPHAGEAL ECHOCARDIOGRAM (TEE);  Surgeon: Barbaraann Darryle Ned, MD;  Location: [facility] ENDOSCOPY;  Service: Cardiovascular;  Laterality: N/A;   TONSILLECTOMY     TOTAL KNEE ARTHROPLASTY  08/22/2011   Procedure: TOTAL KNEE ARTHROPLASTY;  Surgeon: Dempsey LULLA Moan, MD;  Location: [facility] ORS;  Service: Orthopedics;  Laterality: Right;    Family History  Problem Relation Age of Onset   Heart failure Mother        also arthritis   Stroke Father        also emphysema   Heart failure Father     Social History   Socioeconomic History   Marital status: Married    Spouse name: Not on file   Number of children: 1   Years of education: Not on file   Highest education level: Not on file  Occupational History   Not on file  Tobacco Use   Smoking status: Never   Smokeless tobacco: Never  Vaping Use   Vaping status: Never Used  Substance and Sexual Activity   Alcohol  use: No   Drug use: No   Sexual activity: Not on file  Other Topics Concern   Not on file  Social  History Narrative   Not on file   Social Drivers of Health   Financial Resource Strain: Low Risk  (02/16/2022)   Overall Financial Resource Strain (CARDIA)    Difficulty of Paying Living Expenses: Not  hard at all  Food Insecurity: Not on file  Transportation Needs: No Transportation Needs (02/16/2022)   PRAPARE - Administrator, Civil Service (Medical): No    Lack of Transportation (Non-Medical): No  Physical Activity: Not on file  Stress: Not on file  Social Connections: Not on file  Intimate Partner Violence: Not on file    Review of Systems  Constitutional:  Negative for chills, fever and malaise/fatigue.  Eyes:  Negative for blurred vision and double vision.  Respiratory:  Negative for cough and shortness of breath.   Cardiovascular:  Negative for chest pain and palpitations.  Musculoskeletal:  Positive for joint pain. Negative for falls and myalgias.  Neurological:  Negative for dizziness and headaches.  Psychiatric/Behavioral:  Positive for memory loss. Negative for depression. The patient is nervous/anxious.         Objective    BP 120/75 (BP Location: Left Arm, Patient Position: Sitting)   Pulse 62   Temp 98.1 F (36.7 C)   Ht 5' 6 (1.676 m)   Wt 248 lb 8 oz (112.7 kg)   SpO2 99%   BMI 40.11 kg/m   Physical Exam Constitutional:      General: He is not in acute distress.    Appearance: Normal appearance. He is obese. He is not ill-appearing.  HENT:     Head: Normocephalic and atraumatic.     Mouth/Throat:     Mouth: Mucous membranes are moist.     Pharynx: Oropharynx is clear.  Eyes:     Extraocular Movements: Extraocular movements intact.     Conjunctiva/sclera: Conjunctivae normal.  Cardiovascular:     Rate and Rhythm: Normal rate. Rhythm irregular.     Heart sounds: Normal heart sounds. No murmur heard. Pulmonary:     Effort: Pulmonary effort is normal.     Breath sounds: Normal breath sounds. No wheezing, rhonchi or rales.  Skin:     General: Skin is warm and dry.  Neurological:     General: No focal deficit present.     Mental Status: He is alert and oriented to person, place, and time.  Psychiatric:        Mood and Affect: Mood normal.        Behavior: Behavior normal.       Assessment & Plan:  Encounter to establish care  Primary hypertension Assessment & Plan: 120/75 Controlled. Continue current medications. No change in management. Discussed DASH diet and dietary sodium restrictions.  Continue dietary efforts and physical activity.    Chronic diastolic congestive heart failure (HCC) Assessment & Plan: Stable. Medications refilled. BMP today before refilling potassium, while awaiting [facility] medication records. Continue current treatment plan. Follow up with cardiology as scheduled.   Orders: -     Potassium Chloride  ER; Take 1 tablet (10 mEq total) by mouth daily.  Dispense: 90 tablet; Refill: 1 -     Basic metabolic panel with GFR  Benign localized prostatic hyperplasia with lower urinary tract symptoms (LUTS) Assessment & Plan: Stable. Medications refilled. Follow up with urology as scheduled.    Longstanding persistent atrial fibrillation (HCC) -     Metoprolol  Tartrate; Take 0.5 tablets (12.5 mg total) by mouth 2 (two) times daily.  Dispense: 90 tablet; Refill: 1  Primary osteoarthritis of right knee -     Celecoxib ; Take 1 capsule (200 mg total) by mouth 2 (two) times daily.  Dispense: 180 capsule; Refill: 1  Mixed hyperlipidemia Assessment & Plan: Stable. Continue with current management  without changes. Discussed healthy diet and lifestyle.   Orders: -     Simvastatin ; Take 1 tablet (40 mg total) by mouth at bedtime.  Dispense: 90 tablet; Refill: 1  Memory deficit -     Donepezil HCl; Take 1 tablet (5 mg total) by mouth at bedtime.  Dispense: 90 tablet; Refill: 1  Lumbar radiculopathy -     Gabapentin; Take 1 capsule (100 mg total) by mouth at bedtime.  Dispense: 90 capsule;  Refill: 1   Return in about 6 months (around 10/14/2024).   Charmaine Itzae Miralles, PA-C

## 2024-04-16 NOTE — Assessment & Plan Note (Signed)
 120/75 Controlled. Continue current medications. No change in management. Discussed DASH diet and dietary sodium restrictions.  Continue dietary efforts and physical activity.

## 2024-04-17 LAB — BASIC METABOLIC PANEL WITH GFR
BUN/Creatinine Ratio: 13 (ref 10–24)
BUN: 14 mg/dL (ref 8–27)
CO2: 24 mmol/L (ref 20–29)
Calcium: 8.9 mg/dL (ref 8.6–10.2)
Chloride: 101 mmol/L (ref 96–106)
Creatinine, Ser: 1.04 mg/dL (ref 0.76–1.27)
Glucose: 81 mg/dL (ref 70–99)
Potassium: 4.5 mmol/L (ref 3.5–5.2)
Sodium: 139 mmol/L (ref 134–144)
eGFR: 72 mL/min/1.73 (ref >59)

## 2024-04-25 ENCOUNTER — Other Ambulatory Visit: Payer: Self-pay | Admitting: Cardiology

## 2024-04-26 ENCOUNTER — Other Ambulatory Visit: Payer: Self-pay | Admitting: Physician Assistant

## 2024-04-26 MED ORDER — ALPRAZOLAM 0.5 MG PO TABS: 0.5000 mg | ORAL_TABLET | Freq: Every day | ORAL | 0 refills | Status: DC

## 2024-04-28 ENCOUNTER — Encounter (HOSPITAL_COMMUNITY): Payer: Self-pay | Admitting: Emergency Medicine

## 2024-04-28 ENCOUNTER — Other Ambulatory Visit: Payer: Self-pay

## 2024-04-28 ENCOUNTER — Emergency Department (HOSPITAL_COMMUNITY)
Admission: EM | Admit: 2024-04-28 | Discharge: 2024-04-28 | Disposition: A | Discharge status: Home / Self Care | Attending: Emergency Medicine | Admitting: Emergency Medicine

## 2024-04-28 DIAGNOSIS — I350 Nonrheumatic aortic (valve) stenosis: Secondary | ICD-10-CM | POA: Diagnosis not present

## 2024-04-28 DIAGNOSIS — Z7982 Long term (current) use of aspirin: Secondary | ICD-10-CM | POA: Diagnosis not present

## 2024-04-28 DIAGNOSIS — I509 Heart failure, unspecified: Secondary | ICD-10-CM | POA: Diagnosis not present

## 2024-04-28 DIAGNOSIS — I4891 Unspecified atrial fibrillation: Secondary | ICD-10-CM | POA: Diagnosis not present

## 2024-04-28 DIAGNOSIS — Z7901 Long term (current) use of anticoagulants: Secondary | ICD-10-CM | POA: Insufficient documentation

## 2024-04-28 DIAGNOSIS — I110 Hypertensive heart disease with heart failure: Secondary | ICD-10-CM | POA: Diagnosis not present

## 2024-04-28 DIAGNOSIS — R600 Localized edema: Secondary | ICD-10-CM | POA: Diagnosis present

## 2024-04-28 LAB — CBC WITH DIFFERENTIAL/PLATELET
Abs Immature Granulocytes: 0.02 K/uL (ref 0.00–0.07)
Basophils Absolute: 0 K/uL (ref 0.0–0.1)
Basophils Relative: 1 %
Eosinophils Absolute: 0.1 K/uL (ref 0.0–0.5)
Eosinophils Relative: 2 %
HCT: 38.3 % — ABNORMAL LOW (ref 39.0–52.0)
Hemoglobin: 12.3 g/dL — ABNORMAL LOW (ref 13.0–17.0)
Immature Granulocytes: 1 %
Lymphocytes Relative: 20 %
Lymphs Abs: 0.8 K/uL (ref 0.7–4.0)
MCH: 30 pg (ref 26.0–34.0)
MCHC: 32.1 g/dL (ref 30.0–36.0)
MCV: 93.4 fL (ref 80.0–100.0)
Monocytes Absolute: 0.4 K/uL (ref 0.1–1.0)
Monocytes Relative: 9 %
Neutro Abs: 3 K/uL (ref 1.7–7.7)
Neutrophils Relative %: 67 %
Platelets: 85 K/uL — ABNORMAL LOW (ref 150–400)
RBC: 4.1 MIL/uL — ABNORMAL LOW (ref 4.22–5.81)
RDW: 13.3 % (ref 11.5–15.5)
WBC: 4.3 K/uL (ref 4.0–10.5)
nRBC: 0 % (ref 0.0–0.2)

## 2024-04-28 LAB — COMPREHENSIVE METABOLIC PANEL WITH GFR
ALT: 7 U/L (ref 0–44)
AST: 14 U/L — ABNORMAL LOW (ref 15–41)
Albumin: 3.9 g/dL (ref 3.5–5.0)
Alkaline Phosphatase: 97 U/L (ref 38–126)
Anion gap: 8 (ref 5–15)
BUN: 17 mg/dL (ref 8–23)
CO2: 31 mmol/L (ref 22–32)
Calcium: 9 mg/dL (ref 8.9–10.3)
Chloride: 102 mmol/L (ref 98–111)
Creatinine, Ser: 0.91 mg/dL (ref 0.61–1.24)
GFR, Estimated: >60 mL/min (ref >60)
Glucose, Bld: 88 mg/dL (ref 70–99)
Potassium: 3.9 mmol/L (ref 3.5–5.1)
Sodium: 140 mmol/L (ref 135–145)
Total Bilirubin: 0.8 mg/dL (ref 0.0–1.2)
Total Protein: 6.4 g/dL — ABNORMAL LOW (ref 6.5–8.1)

## 2024-04-28 LAB — PRO BRAIN NATRIURETIC PEPTIDE: Pro Brain Natriuretic Peptide: 1392 pg/mL — ABNORMAL HIGH (ref <300.0)

## 2024-04-28 MED ORDER — FUROSEMIDE 10 MG/ML IJ SOLN
40.0000 mg | INTRAMUSCULAR | Status: AC
  Administered 2024-04-28: 40 mg via INTRAVENOUS
  Filled 2024-04-28: qty 4

## 2024-04-28 MED ORDER — FUROSEMIDE 40 MG PO TABS: 60.0000 mg | ORAL_TABLET | Freq: Every day | ORAL | 0 refills | Status: DC

## 2024-04-28 NOTE — ED Triage Notes (Signed)
 Pt to the ED with complaints of bilateral leg swelling with pain.

## 2024-04-28 NOTE — ED Provider Notes (Signed)
 [facility] AT [facility] Provider Note   CSN: [id number] Arrival date & time: 04/28/24  9092     Patient presents with: Leg Swelling   Paul Lam is a 83 y.o. male.   HPI   Patient is an 83 year old male with a known history of hypertension, atrial fibrillation no longer anticoagulated, history of lower extremity edema which has been chronic for years but worsening over the last year and especially over the last 2 weeks.  He has been seen by orthopedics and referred to a lymphedema clinic but has not yet seen them, he does not wear compression stockings, he does take Lasix  40 mg a day, this has not changed any significant degree and they have not gone up on his medication dosing.  He recently reestablished care with a new family doctor.  Prior to Admission medications   Medication Sig Start Date End Date Taking? Authorizing Provider  ALPRAZolam  (XANAX ) 0.5 MG tablet Take 1 tablet (0.5 mg total) by mouth at bedtime. Anxiety 04/26/24   Grooms, Charmaine, PA-C  ascorbic acid (VITAMIN C) 500 MG tablet Take 500 mg by mouth daily.    [provider]  aspirin  EC 81 MG tablet Take 1 tablet (81 mg total) by mouth daily. Swallow whole. 01/04/21   Wonda Sharper, MD  bacitracin -polymyxin b  (POLYSPORIN ) ointment Apply 1 Application topically 2 (two) times daily. Patient taking differently: Apply 1 Application topically daily as needed.    [provider]  brimonidine  (ALPHAGAN ) 0.2 % ophthalmic solution Place 1 drop into the left eye in the morning and at bedtime. 05/20/21   [provider]  Calcium Polycarbophil (FIBER-CAPS PO) Take 1 capsule by mouth daily.    [provider]  carboxymethylcellulose (REFRESH PLUS) 0.5 % SOLN Place 1 drop into the right eye daily as needed (dry eyes).    [provider]  celecoxib  (CELEBREX ) 200 MG capsule Take 1 capsule (200 mg total) by mouth 2 (two) times daily. 04/16/24   Grooms,  Courtney, PA-C  Cholecalciferol (VITAMIN D3) 50 MCG (2000 UT) capsule Take 2,000 Units by mouth daily.    [provider]  cyanocobalamin  (VITAMIN B12) 500 MCG tablet Take 500 mcg by mouth daily.    [provider]  docusate sodium  (COLACE) 100 MG capsule Take 100 mg by mouth 2 (two) times daily.    [provider]  donepezil  (ARICEPT ) 5 MG tablet Take 1 tablet (5 mg total) by mouth at bedtime. 04/16/24   Grooms, Courtney, PA-C  finasteride  (PROSCAR ) 5 MG tablet Take 1 tablet (5 mg total) by mouth daily. 08/14/23   McKenzie, Belvie CROME, MD  furosemide  (LASIX ) 40 MG tablet Take 1.5 tablets (60 mg total) by mouth daily. 04/28/24 05/28/24  Cleotilde Rogue, MD  gabapentin  (NEURONTIN ) 100 MG capsule Take 1 capsule (100 mg total) by mouth at bedtime. 04/16/24   Grooms, Courtney, PA-C  ibuprofen (ADVIL) 100 MG tablet Take 200 mg by mouth every 6 (six) hours as needed for fever.    [provider]  metoprolol  tartrate (LOPRESSOR ) 25 MG tablet Take 0.5 tablets (12.5 mg total) by mouth 2 (two) times daily. 04/16/24   Grooms, Courtney, PA-C  Misc Natural Products (TART CHERRY ADVANCED PO) Take 1 tablet by mouth daily.     [provider]  Misc Natural Products (TART CHERRY ADVANCED) CAPS Take 1 capsule by mouth once. 850 MG & 0.8% Anthocyanins    [provider]  NON FORMULARY at bedtime. CPAP  [provider]  potassium chloride  (KLOR-CON ) 10 MEQ tablet Take 1 tablet (10 mEq total) by mouth daily. 04/16/24   Grooms, Courtney, PA-C  psyllium (METAMUCIL) 58.6 % powder Take 1 packet by mouth 3 (three) times daily. 100 mg Psyllium Husk    [provider]  simvastatin  (ZOCOR ) 40 MG tablet Take 1 tablet (40 mg total) by mouth at bedtime. 04/16/24   Grooms, Courtney, PA-C  tamsulosin  (FLOMAX ) 0.4 MG CAPS capsule Take 1 capsule (0.4 mg total) by mouth in the morning and at bedtime. 08/14/23   McKenzie, Belvie CROME, MD  Zinc  50 MG TABS Take 50 mg by  mouth daily.    [provider]    Allergies: Patient has no known allergies.    Review of Systems  All other systems reviewed and are negative.   Updated Vital Signs BP 126/66   Pulse (!) 51   Temp 97.9 F (36.6 C) (Oral)   Resp 15   Ht 1.676 m (5' 6)   Wt 112.7 kg   SpO2 98%   BMI 40.11 kg/m   Physical Exam Vitals and nursing note reviewed.  Constitutional:      General: He is not in acute distress.    Appearance: He is well-developed.  HENT:     Head: Normocephalic and atraumatic.     Mouth/Throat:     Pharynx: No oropharyngeal exudate.  Eyes:     General: No scleral icterus.       Right eye: No discharge.        Left eye: No discharge.     Conjunctiva/sclera: Conjunctivae normal.     Pupils: Pupils are equal, round, and reactive to light.  Neck:     Thyroid : No thyromegaly.     Vascular: No JVD.  Cardiovascular:     Rate and Rhythm: Normal rate and regular rhythm.     Heart sounds: Murmur heard.     No friction rub. No gallop.  Pulmonary:     Effort: Pulmonary effort is normal. No respiratory distress.     Breath sounds: Normal breath sounds. No wheezing or rales.  Abdominal:     General: Bowel sounds are normal. There is no distension.     Palpations: Abdomen is soft. There is no mass.     Tenderness: There is no abdominal tenderness.  Musculoskeletal:        General: No tenderness. Normal range of motion.     Cervical back: Normal range of motion and neck supple.     Right lower leg: Edema present.     Left lower leg: Edema present.     Comments: Symmetrical 2+ lower extremity edema below the knees, good pulses, good blood flow, no induration or redness to the skin, no warmth  Lymphadenopathy:     Cervical: No cervical adenopathy.  Skin:    General: Skin is warm and dry.     Findings: No erythema or rash.  Neurological:     General: No focal deficit present.     Mental Status: He is alert.     Coordination: Coordination normal.   Psychiatric:        Behavior: Behavior normal.     (all labs ordered are listed, but only abnormal results are displayed) Labs Reviewed  PRO BRAIN NATRIURETIC PEPTIDE - Abnormal; Notable for the following components:      Result Value   Pro Brain Natriuretic Peptide 1,392.0 (*)    All other components within normal limits  COMPREHENSIVE  METABOLIC PANEL WITH GFR - Abnormal; Notable for the following components:   Total Protein 6.4 (*)    AST 14 (*)    All other components within normal limits  CBC WITH DIFFERENTIAL/PLATELET - Abnormal; Notable for the following components:   RBC 4.10 (*)    Hemoglobin 12.3 (*)    HCT 38.3 (*)    Platelets 85 (*)    All other components within normal limits    EKG: EKG Interpretation Date/Time:  Sunday April 28 2024 09:42:48 EST Ventricular Rate:  51 PR Interval:    QRS Duration:  106 QT Interval:  434 QTC Calculation: 400 R Axis:   82  Text Interpretation: Atrial fibrillation Borderline right axis deviation since last tracing no significant change Confirmed by Cleotilde Rogue ([id number]) on 04/28/2024 9:50:57 AM  Radiology: No results found.   Procedures   Medications Ordered in the ED  furosemide  (LASIX ) injection 40 mg (40 mg Intravenous Given 04/28/24 1000)                                    Medical Decision Making Amount and/or Complexity of Data Reviewed Labs: ordered. ECG/medicine tests: ordered.  Risk Prescription drug management.    This patient presents to the ED for concern of swelling of the legs with leg pain differential diagnosis includes worsening edema as the most likely cause, rule out congestive heart failure, doubt DVT, likely has chronic lymphedema, could be liver dysfunction    Additional history obtained   Additional history obtained from Electronic Medical Record External records from outside source obtained and reviewed including record including prior office visits Prior echocardiogram from a  month ago showed normal ejection fraction, moderate aortic stenosis   Lab Tests:  I Ordered, and personally interpreted labs.  The pertinent results include: CBC and metabolic panel overall unremarkable, the patient has normal kidney function normal electrolytes, there is chronic thrombocytopenia which does not appear new, no other anemia, the BNP was elevated at almost 1400 which is up from previous measurements several years ago    Medicines ordered and prescription drug management:  I ordered medication including intravenous furosemide  with diuresis    I have reviewed the patients home medicines and have made adjustments as needed   Problem List / ED Course:  I discussed the patient's care with the on-call cardiologist who agrees that the patient needs to follow-up closely outpatient but does not need admission to the hospital at this time.  Will increase diuresis to 60 mg daily instead of 40, the patient is stable for discharge and was placed into some compression wraps prior to discharge   Social Determinants of Health:  Obese, heart failure        Final diagnoses:  Congestive heart failure, unspecified HF chronicity, unspecified heart failure type (HCC)  Aortic valve stenosis, etiology of cardiac valve disease unspecified    ED Discharge Orders          Ordered    furosemide  (LASIX ) 40 MG tablet  Daily       Note to Pharmacy: PATIENT NEEDS APPOINTMENT WITH CARDIOLOGIST FOR FUTURE REFILLS   04/28/24 1105               Cleotilde Rogue, MD 04/28/24 1106

## 2024-04-28 NOTE — Discharge Instructions (Addendum)
 Increase lasix  to 60mg  daily  Call your doctor this week - Dr. Debera or your cardiologist to be seen this week.

## 2024-05-05 ENCOUNTER — Encounter (HOSPITAL_COMMUNITY): Payer: Self-pay | Admitting: Emergency Medicine

## 2024-05-05 ENCOUNTER — Emergency Department (HOSPITAL_COMMUNITY)
Admission: EM | Admit: 2024-05-05 | Discharge: 2024-05-05 | Disposition: A | Discharge status: Home / Self Care | Attending: Emergency Medicine | Admitting: Emergency Medicine

## 2024-05-05 DIAGNOSIS — Z79899 Other long term (current) drug therapy: Secondary | ICD-10-CM | POA: Diagnosis not present

## 2024-05-05 DIAGNOSIS — I872 Venous insufficiency (chronic) (peripheral): Secondary | ICD-10-CM | POA: Insufficient documentation

## 2024-05-05 DIAGNOSIS — R600 Localized edema: Secondary | ICD-10-CM | POA: Diagnosis present

## 2024-05-05 DIAGNOSIS — I10 Essential (primary) hypertension: Secondary | ICD-10-CM | POA: Diagnosis not present

## 2024-05-05 DIAGNOSIS — Z7982 Long term (current) use of aspirin: Secondary | ICD-10-CM | POA: Insufficient documentation

## 2024-05-05 LAB — CBC WITH DIFFERENTIAL/PLATELET
Abs Immature Granulocytes: 0.01 K/uL (ref 0.00–0.07)
Basophils Absolute: 0 K/uL (ref 0.0–0.1)
Basophils Relative: 0 %
Eosinophils Absolute: 0.2 K/uL (ref 0.0–0.5)
Eosinophils Relative: 4 %
HCT: 38.5 % — ABNORMAL LOW (ref 39.0–52.0)
Hemoglobin: 12.5 g/dL — ABNORMAL LOW (ref 13.0–17.0)
Immature Granulocytes: 0 %
Lymphocytes Relative: 22 %
Lymphs Abs: 1.3 K/uL (ref 0.7–4.0)
MCH: 30.4 pg (ref 26.0–34.0)
MCHC: 32.5 g/dL (ref 30.0–36.0)
MCV: 93.7 fL (ref 80.0–100.0)
Monocytes Absolute: 0.4 K/uL (ref 0.1–1.0)
Monocytes Relative: 7 %
Neutro Abs: 3.8 K/uL (ref 1.7–7.7)
Neutrophils Relative %: 67 %
Platelets: 95 K/uL — ABNORMAL LOW (ref 150–400)
RBC: 4.11 MIL/uL — ABNORMAL LOW (ref 4.22–5.81)
RDW: 13.2 % (ref 11.5–15.5)
WBC: 5.7 K/uL (ref 4.0–10.5)
nRBC: 0 % (ref 0.0–0.2)

## 2024-05-05 LAB — BASIC METABOLIC PANEL WITH GFR
Anion gap: 13 (ref 5–15)
BUN: 19 mg/dL (ref 8–23)
CO2: 26 mmol/L (ref 22–32)
Calcium: 8.8 mg/dL — ABNORMAL LOW (ref 8.9–10.3)
Chloride: 103 mmol/L (ref 98–111)
Creatinine, Ser: 1.05 mg/dL (ref 0.61–1.24)
GFR, Estimated: >60 mL/min (ref >60)
Glucose, Bld: 96 mg/dL (ref 70–99)
Potassium: 3.7 mmol/L (ref 3.5–5.1)
Sodium: 142 mmol/L (ref 135–145)

## 2024-05-05 LAB — PRO BRAIN NATRIURETIC PEPTIDE: Pro Brain Natriuretic Peptide: 1151 pg/mL — ABNORMAL HIGH (ref <300.0)

## 2024-05-05 MED ORDER — FUROSEMIDE 40 MG PO TABS: 40.0000 mg | ORAL_TABLET | Freq: Two times a day (BID) | ORAL | 0 refills | Status: DC

## 2024-05-05 MED ORDER — FUROSEMIDE 10 MG/ML IJ SOLN
40.0000 mg | INTRAMUSCULAR | Status: AC
  Administered 2024-05-05: 40 mg via INTRAVENOUS
  Filled 2024-05-05: qty 4

## 2024-05-05 NOTE — ED Triage Notes (Signed)
 Pt returns to the Ed with bil leg pain and swelling , was seen for same 1 week ago ,

## 2024-05-05 NOTE — ED Provider Notes (Signed)
 [place] [facility]MENT AT [facility] Provider Note   CSN: [id number] Arrival date & time: 05/05/24  1743     Patient presents with: Leg Swelling   Paul Lam is a 83 y.o. male.   HPI   This patient is an 83 year old male with a history of peripheral edema this patient is an 83 year old male with a history of high blood pressure, lower extremity edema which has been chronic over the last couple of years but has been worsened recently.  He has been referred to a lymphedema clinic by Dr. Debera according to the patient's report but they have not had an appointment made yet.  He does not wear compression stockings because he states it is too hard to bend over and do it, his spouse has not been helping him with it.  The patient was seen 1 week ago by myself because of the same complaint of swelling of his bilateral legs.  At that time he had a workup for the swelling which included a CBC which was unremarkable, a BNP which was about 1400 and a comprehensive metabolic panel which was unremarkable.  His last echocardiogram was from October, it showed that the patient had an ejection fraction of 55 to 60%  I increased the patient's Lasix  to 60 mg a day last week, he has been taking it but because the spouse did not want him to get dehydrated she has been giving him increased amounts of water  every day.  They have not been wrapping his legs nor have they been wearing compression stockings.  He presents today because he is having some increased redness and itching of his bilateral legs associated with the ongoing swelling.  He is not short of breath or febrile or having any chest pain  Prior to Admission medications   Medication Sig Start Date End Date Taking? Authorizing Provider  furosemide  (LASIX ) 40 MG tablet Take 1 tablet (40 mg total) by mouth 2 (two) times daily for 7 days. 05/05/24 05/12/24 Yes Cleotilde Rogue, MD  ALPRAZolam  (XANAX ) 0.5 MG tablet Take 1 tablet (0.5 mg  total) by mouth at bedtime. Anxiety 04/26/24   Grooms, Charmaine, PA-C  ascorbic acid (VITAMIN C) 500 MG tablet Take 500 mg by mouth daily.    [provider]  aspirin  EC 81 MG tablet Take 1 tablet (81 mg total) by mouth daily. Swallow whole. 01/04/21   Wonda Sharper, MD  bacitracin -polymyxin b  (POLYSPORIN ) ointment Apply 1 Application topically 2 (two) times daily. Patient taking differently: Apply 1 Application topically daily as needed.    [provider]  brimonidine  (ALPHAGAN ) 0.2 % ophthalmic solution Place 1 drop into the left eye in the morning and at bedtime. 05/20/21   [provider]  Calcium Polycarbophil (FIBER-CAPS PO) Take 1 capsule by mouth daily.    [provider]  carboxymethylcellulose (REFRESH PLUS) 0.5 % SOLN Place 1 drop into the right eye daily as needed (dry eyes).    [provider]  celecoxib  (CELEBREX ) 200 MG capsule Take 1 capsule (200 mg total) by mouth 2 (two) times daily. 04/16/24   Grooms, Courtney, PA-C  Cholecalciferol (VITAMIN D3) 50 MCG (2000 UT) capsule Take 2,000 Units by mouth daily.    [provider]  cyanocobalamin  (VITAMIN B12) 500 MCG tablet Take 500 mcg by mouth daily.    [provider]  docusate sodium  (COLACE) 100 MG capsule Take 100 mg by mouth 2 (two) times daily.    [provider]  donepezil  (ARICEPT ) 5 MG tablet Take 1 tablet (5 mg total) by mouth at bedtime. 04/16/24   Grooms, Courtney, PA-C  finasteride  (PROSCAR ) 5 MG tablet Take 1 tablet (5 mg total) by mouth daily. 08/14/23   McKenzie, Belvie CROME, MD  furosemide  (LASIX ) 40 MG tablet Take 1.5 tablets (60 mg total) by mouth daily. 04/28/24 05/28/24  Cleotilde Rogue, MD  gabapentin  (NEURONTIN ) 100 MG capsule Take 1 capsule (100 mg total) by mouth at bedtime. 04/16/24   Grooms, Courtney, PA-C  ibuprofen (ADVIL) 100 MG tablet Take 200 mg by mouth every 6 (six) hours as needed for fever.    [provider]  metoprolol   tartrate (LOPRESSOR ) 25 MG tablet Take 0.5 tablets (12.5 mg total) by mouth 2 (two) times daily. 04/16/24   Grooms, Courtney, PA-C  Misc Natural Products (TART CHERRY ADVANCED PO) Take 1 tablet by mouth daily.     [provider]  Misc Natural Products (TART CHERRY ADVANCED) CAPS Take 1 capsule by mouth once. 850 MG & 0.8% Anthocyanins    [provider]  NON FORMULARY at bedtime. CPAP    [provider]  potassium chloride  (KLOR-CON ) 10 MEQ tablet Take 1 tablet (10 mEq total) by mouth daily. 04/16/24   Grooms, Courtney, PA-C  psyllium (METAMUCIL) 58.6 % powder Take 1 packet by mouth 3 (three) times daily. 100 mg Psyllium Husk    [provider]  simvastatin  (ZOCOR ) 40 MG tablet Take 1 tablet (40 mg total) by mouth at bedtime. 04/16/24   Grooms, Courtney, PA-C  tamsulosin  (FLOMAX ) 0.4 MG CAPS capsule Take 1 capsule (0.4 mg total) by mouth in the morning and at bedtime. 08/14/23   McKenzie, Belvie CROME, MD  Zinc  50 MG TABS Take 50 mg by mouth daily.    [provider]    Allergies: Patient has no known allergies.    Review of Systems  All other systems reviewed and are negative.   Updated Vital Signs BP 136/81   Pulse 77   Temp 98 F (36.7 C) (Oral)   Resp 18   Ht 1.676 m (5' 6)   Wt 112.5 kg   SpO2 99%   BMI 40.03 kg/m   Physical Exam Vitals and nursing note reviewed.  Constitutional:      General: He is not in acute distress.    Appearance: He is well-developed.  HENT:     Head: Normocephalic and atraumatic.     Mouth/Throat:     Pharynx: No oropharyngeal exudate.  Eyes:     General: No scleral icterus.       Right eye: No discharge.        Left eye: No discharge.     Conjunctiva/sclera: Conjunctivae normal.     Pupils: Pupils are equal, round, and reactive to light.  Neck:     Thyroid : No thyromegaly.     Vascular: No JVD.  Cardiovascular:     Rate and Rhythm: Normal rate and regular rhythm.     Heart sounds: Normal heart  sounds. No murmur heard.    No friction rub. No gallop.  Pulmonary:     Effort: Pulmonary effort is normal. No respiratory distress.     Breath sounds: Normal breath sounds. No wheezing or rales.  Abdominal:     General: Bowel sounds are normal. There is no distension.     Palpations: Abdomen is soft. There is no mass.     Tenderness: There is no abdominal tenderness.  Musculoskeletal:  General: No tenderness. Normal range of motion.     Cervical back: Normal range of motion and neck supple.     Right lower leg: Edema present.     Left lower leg: Edema present.  Lymphadenopathy:     Cervical: No cervical adenopathy.  Skin:    General: Skin is warm and dry.     Findings: Rash present. No erythema.     Comments: There is a mild erythema to the bilateral lower extremities, there is no induration or warmth, this does not involve the feet it was only in the pretibial region bilaterally  Neurological:     Mental Status: He is alert.     Coordination: Coordination normal.  Psychiatric:        Behavior: Behavior normal.     (all labs ordered are listed, but only abnormal results are displayed) Labs Reviewed  BASIC METABOLIC PANEL WITH GFR - Abnormal; Notable for the following components:      Result Value   Calcium 8.8 (*)    All other components within normal limits  PRO BRAIN NATRIURETIC PEPTIDE - Abnormal; Notable for the following components:   Pro Brain Natriuretic Peptide 1,151.0 (*)    All other components within normal limits  CBC WITH DIFFERENTIAL/PLATELET - Abnormal; Notable for the following components:   RBC 4.11 (*)    Hemoglobin 12.5 (*)    HCT 38.5 (*)    Platelets 95 (*)    All other components within normal limits    EKG: None  Radiology: No results found.   Procedures   Medications Ordered in the ED  furosemide  (LASIX ) injection 40 mg (40 mg Intravenous Given 05/05/24 1838)                                    Medical Decision Making Amount  and/or Complexity of Data Reviewed Labs: ordered.  Risk Prescription drug management.    This patient presents to the ED for concern of swelling of his legs with redness and itching differential diagnosis includes stasis dermatitis likely from chronic edema, the patient has no findings of cellulitis including fever tachycardia or tenderness to the legs.    Additional history obtained   Additional history obtained from Electronic Medical Record External records from outside source obtained and reviewed including prior labs   Lab Tests:  I Ordered, and personally interpreted labs.  The pertinent results include: Metabolic panel shows preserved renal function, BNP is better than it was last week    Medicines ordered and prescription drug management:  I ordered medication including furosemide  40 mg IV    I have reviewed the patients home medicines and have made adjustments as needed   Problem List / ED Course:  The patient has legs wrapped with a compression wrap prior to discharge, I encouraged the family to use the compression stockings and follow-up with cardiology family doctor and increase Lasix  to 40 mg twice a day for the next 7 days Limiting oral water  intake to 60 ounces per day   Social Determinants of Health:  Chronic edema        Final diagnoses:  Peripheral edema  Stasis dermatitis of both legs    ED Discharge Orders          Ordered    furosemide  (LASIX ) 40 MG tablet  2 times daily        05/05/24 1851  Cleotilde Rogue, MD 05/05/24 [phone]

## 2024-05-05 NOTE — Discharge Instructions (Signed)
 As we discussed last week you are supposed to be taking increased doses of Lasix  or furosemide , 60 mg/day.  I am going to increase this this week to 40 mg twice a day for 1 week then cut back to 60 mg daily.  We talked about wearing compression stockings and doing compression wraps of the legs and following up with the lymphedema clinic.  Please make sure that you are doing this.  I have asked Dr. Debera to have the office staff follow-up with you about getting into the lymphedema clinic.  Please limit your oral water  intake to 60 ounces of water  per day, that is approximately 4 bottles of water  per day for the next week, you will need to have your family doctor check you within 1 week

## 2024-05-05 NOTE — ED Notes (Signed)
 Bilateral lower extremities wrapped with kerlex and ace bandages.

## 2024-05-06 ENCOUNTER — Telehealth: Payer: Self-pay | Admitting: Cardiology

## 2024-05-06 NOTE — Telephone Encounter (Signed)
 Will forward to provider and schedulers.

## 2024-05-06 NOTE — Telephone Encounter (Signed)
 Pt spouse called in stating pt was in the ER last night for lymphedema. She states Dr. Debera referred him to the lymphedema clinic but he has not got an appointment yet, they added him to waitlist. She asked for advise on what patient should be doing in the meantime. Please advise.

## 2024-05-07 ENCOUNTER — Other Ambulatory Visit: Payer: Self-pay

## 2024-05-07 DIAGNOSIS — I890 Lymphedema, not elsewhere classified: Secondary | ICD-10-CM

## 2024-05-09 ENCOUNTER — Encounter: Payer: Self-pay | Admitting: Physician Assistant

## 2024-05-09 ENCOUNTER — Ambulatory Visit: Payer: Self-pay | Admitting: Physician Assistant

## 2024-05-09 VITALS — BP 126/72 | HR 56 | Ht 66.0 in | Wt 246.0 lb

## 2024-05-09 DIAGNOSIS — I5033 Acute on chronic diastolic (congestive) heart failure: Secondary | ICD-10-CM

## 2024-05-09 MED ORDER — FUROSEMIDE 40 MG PO TABS: 40.0000 mg | ORAL_TABLET | Freq: Two times a day (BID) | ORAL | 1 refills | Status: AC

## 2024-05-09 NOTE — Progress Notes (Signed)
 Established Patient Office Visit  Subjective   Patient ID: Paul Lam, male    DOB: Jul 10, 1940  Age: 82 y.o. MRN: [id number]  Chief Complaint  Patient presents with   Edema    Patient is here for having red/raw/swelling pain in both legs that start at the knee that runs down to the feet. Right foot gets more inflamed than the left foot. Has been to the ER twice in one week and gave him fluids and wrapped his calfs up    Discussed the use of AI scribe software for clinical note transcription with the patient, who gave verbal consent to proceed.  History of Present Illness Paul Lam is an 83 year old male with heart failure and atrial fibrillation who presents with leg swelling.  He has had persistent bilateral leg swelling since his last visit, leading to two ER visits where Lasix  was first increased to 60 mg and then adjusted to 40 mg twice daily. Swelling remains painful and itchy with red skin. He reports no fevers. BNP was elevated at both ER visits.  He has occasional shortness of breath and heaviness in his legs without clear change from baseline. He occasionally feels lightheaded with walking and has trouble finding words. He has known atrial fibrillation.  His fluid intake is restricted to 60 ounces daily, including all beverages, though he has not been as adherent due to concerns for dehydration. He continues Lasix  40 mg twice daily. He has an appointment with the lymphedema clinic today and cardiology follow up beginning of next month.     Review of Systems  Constitutional:  Negative for activity change, appetite change, fatigue and fever.  Respiratory:  Negative for chest tightness and shortness of breath.   Cardiovascular:  Positive for leg swelling. Negative for chest pain and palpitations.  Skin:  Positive for color change. Negative for rash and wound.       Objective:     BP 126/72 (BP Location: Right Arm, Patient Position: Sitting)   Pulse (!) 56   Ht  5' 6 (1.676 m)   Wt 246 lb (111.6 kg)   SpO2 99%   BMI 39.71 kg/m    Physical Exam Constitutional:      General: He is not in acute distress.    Appearance: Normal appearance. He is obese. He is not ill-appearing.  HENT:     Head: Normocephalic and atraumatic.     Mouth/Throat:     Mouth: Mucous membranes are moist.     Pharynx: Oropharynx is clear.  Eyes:     Extraocular Movements: Extraocular movements intact.     Conjunctiva/sclera: Conjunctivae normal.  Cardiovascular:     Rate and Rhythm: Normal rate and regular rhythm.     Heart sounds: Normal heart sounds. No murmur heard. Pulmonary:     Effort: Pulmonary effort is normal.     Breath sounds: Normal breath sounds. No rales.  Musculoskeletal:     Right lower leg: Edema present.     Left lower leg: Edema present.     Comments: Significant bilateral lower edema, unable to visual skin due to compression wraps.   Skin:    General: Skin is warm and dry.  Neurological:     General: No focal deficit present.     Mental Status: He is alert and oriented to person, place, and time.  Psychiatric:        Mood and Affect: Mood normal.        Behavior:  Behavior normal.      No results found for any visits on 05/09/24.  The ASCVD Risk score (Arnett DK, et al., 2019) failed to calculate for the following reasons:   The 2019 ASCVD risk score is only valid for ages 36 to 10    Assessment & Plan:   Return if symptoms worsen or fail to improve.   Acute on chronic diastolic congestive heart failure (HCC) Assessment & Plan: Increased leg swelling with redness and itching, and elevated BNP levels indicate worsening heart failure. He appears well today, normal heart sounds, lungs clear to auscultation bilaterally, no rales.  Compression therapy is challenging. Discussed fluid restriction is crucial. - Continue Lasix  40 mg twice daily until follow up with cardiology in 1 month. - Maintain fluid restriction to 60 ounces per day. -  Ordered repeat  BNP and CMP today. - Coordinated with cardiology team for further management. - Monitor for signs of cellulitis such as fever, increased redness, or pain. - Continue current compression wrapping and follow up with lymphedema clinic.   Orders: -     Brain natriuretic peptide -     CMP14+EGFR -     Furosemide ; Take 1 tablet (40 mg total) by mouth 2 (two) times daily.  Dispense: 30 tablet; Refill: 1   Samanthamarie Ezzell, PA-C

## 2024-05-09 NOTE — Assessment & Plan Note (Signed)
 Increased leg swelling with redness and itching, and elevated BNP levels indicate worsening heart failure. He appears well today, normal heart sounds, lungs clear to auscultation bilaterally, no rales.  Compression therapy is challenging. Discussed fluid restriction is crucial. - Continue Lasix  40 mg twice daily until follow up with cardiology in 1 month. - Maintain fluid restriction to 60 ounces per day. - Ordered repeat  BNP and CMP today. - Coordinated with cardiology team for further management. - Monitor for signs of cellulitis such as fever, increased redness, or pain. - Continue current compression wrapping and follow up with lymphedema clinic.

## 2024-05-09 NOTE — Patient Instructions (Signed)
 Watch fluid intake and keep to 60 oz daily.  Monitor weight daily, follow up if more than 5 pound weight gain.  Continue Lasix  40 mg twice daily, will call to confirm dosing once I hear from cardiology.   Follow up with me or cardiology for new shortness of breath, worsening swelling, or chest pain.   Lab work today.

## 2024-05-11 LAB — CMP14+EGFR
ALT: 5 IU/L (ref 0–44)
AST: 12 IU/L (ref 0–40)
Albumin: 3.9 g/dL (ref 3.7–4.7)
Alkaline Phosphatase: 100 IU/L (ref 48–129)
BUN/Creatinine Ratio: 15 (ref 10–24)
BUN: 16 mg/dL (ref 8–27)
Bilirubin Total: 0.9 mg/dL (ref 0.0–1.2)
CO2: 27 mmol/L (ref 20–29)
Calcium: 8.5 mg/dL — ABNORMAL LOW (ref 8.6–10.2)
Chloride: 102 mmol/L (ref 96–106)
Creatinine, Ser: 1.07 mg/dL (ref 0.76–1.27)
Globulin, Total: 2.3 g/dL (ref 1.5–4.5)
Glucose: 87 mg/dL (ref 70–99)
Potassium: 4.1 mmol/L (ref 3.5–5.2)
Sodium: 144 mmol/L (ref 134–144)
Total Protein: 6.2 g/dL (ref 6.0–8.5)
eGFR: 69 mL/min/1.73 (ref >59)

## 2024-05-11 LAB — BRAIN NATRIURETIC PEPTIDE: BNP: 85.2 pg/mL (ref 0.0–100.0)

## 2024-05-12 ENCOUNTER — Emergency Department (HOSPITAL_COMMUNITY)
Admission: EM | Admit: 2024-05-12 | Discharge: 2024-05-12 | Disposition: A | Discharge status: Home / Self Care | Attending: Emergency Medicine | Admitting: Emergency Medicine

## 2024-05-12 ENCOUNTER — Other Ambulatory Visit: Payer: Self-pay

## 2024-05-12 ENCOUNTER — Encounter (HOSPITAL_COMMUNITY): Payer: Self-pay | Admitting: *Deleted

## 2024-05-12 ENCOUNTER — Emergency Department (HOSPITAL_COMMUNITY)

## 2024-05-12 DIAGNOSIS — M7989 Other specified soft tissue disorders: Secondary | ICD-10-CM | POA: Insufficient documentation

## 2024-05-12 DIAGNOSIS — I110 Hypertensive heart disease with heart failure: Secondary | ICD-10-CM | POA: Insufficient documentation

## 2024-05-12 DIAGNOSIS — L2489 Irritant contact dermatitis due to other agents: Secondary | ICD-10-CM | POA: Insufficient documentation

## 2024-05-12 DIAGNOSIS — R600 Localized edema: Secondary | ICD-10-CM | POA: Insufficient documentation

## 2024-05-12 DIAGNOSIS — I509 Heart failure, unspecified: Secondary | ICD-10-CM | POA: Insufficient documentation

## 2024-05-12 DIAGNOSIS — Z79899 Other long term (current) drug therapy: Secondary | ICD-10-CM | POA: Insufficient documentation

## 2024-05-12 DIAGNOSIS — Z7982 Long term (current) use of aspirin: Secondary | ICD-10-CM | POA: Insufficient documentation

## 2024-05-12 LAB — CBC WITH DIFFERENTIAL/PLATELET
Abs Immature Granulocytes: 0.03 K/uL (ref 0.00–0.07)
Basophils Absolute: 0 K/uL (ref 0.0–0.1)
Basophils Relative: 0 %
Eosinophils Absolute: 0.5 K/uL (ref 0.0–0.5)
Eosinophils Relative: 7 %
HCT: 40 % (ref 39.0–52.0)
Hemoglobin: 12.8 g/dL — ABNORMAL LOW (ref 13.0–17.0)
Immature Granulocytes: 0 %
Lymphocytes Relative: 25 %
Lymphs Abs: 1.7 K/uL (ref 0.7–4.0)
MCH: 29.8 pg (ref 26.0–34.0)
MCHC: 32 g/dL (ref 30.0–36.0)
MCV: 93.2 fL (ref 80.0–100.0)
Monocytes Absolute: 0.6 K/uL (ref 0.1–1.0)
Monocytes Relative: 8 %
Neutro Abs: 4.1 K/uL (ref 1.7–7.7)
Neutrophils Relative %: 60 %
Platelets: 113 K/uL — ABNORMAL LOW (ref 150–400)
RBC: 4.29 MIL/uL (ref 4.22–5.81)
RDW: 13.2 % (ref 11.5–15.5)
WBC: 6.9 K/uL (ref 4.0–10.5)
nRBC: 0 % (ref 0.0–0.2)

## 2024-05-12 LAB — URINALYSIS, ROUTINE W REFLEX MICROSCOPIC
Bilirubin Urine: NEGATIVE
Glucose, UA: NEGATIVE mg/dL
Hgb urine dipstick: NEGATIVE
Ketones, ur: NEGATIVE mg/dL
Leukocytes,Ua: NEGATIVE
Nitrite: NEGATIVE
Protein, ur: NEGATIVE mg/dL
Specific Gravity, Urine: 1.008 (ref 1.005–1.030)
pH: 6 (ref 5.0–8.0)

## 2024-05-12 LAB — COMPREHENSIVE METABOLIC PANEL WITH GFR
ALT: 6 U/L (ref 0–44)
AST: 16 U/L (ref 15–41)
Albumin: 4 g/dL (ref 3.5–5.0)
Alkaline Phosphatase: 105 U/L (ref 38–126)
Anion gap: 13 (ref 5–15)
BUN: 20 mg/dL (ref 8–23)
CO2: 27 mmol/L (ref 22–32)
Calcium: 8.8 mg/dL — ABNORMAL LOW (ref 8.9–10.3)
Chloride: 102 mmol/L (ref 98–111)
Creatinine, Ser: 1.03 mg/dL (ref 0.61–1.24)
GFR, Estimated: >60 mL/min (ref >60)
Glucose, Bld: 81 mg/dL (ref 70–99)
Potassium: 3.5 mmol/L (ref 3.5–5.1)
Sodium: 142 mmol/L (ref 135–145)
Total Bilirubin: 0.8 mg/dL (ref 0.0–1.2)
Total Protein: 6.7 g/dL (ref 6.5–8.1)

## 2024-05-12 LAB — PRO BRAIN NATRIURETIC PEPTIDE: Pro Brain Natriuretic Peptide: 1176 pg/mL — ABNORMAL HIGH (ref <300.0)

## 2024-05-12 MED ORDER — METHYLPREDNISOLONE SODIUM SUCC 125 MG IJ SOLR
125.0000 mg | Freq: Once | INTRAMUSCULAR | Status: AC
  Administered 2024-05-12: 125 mg via INTRAVENOUS
  Filled 2024-05-12: qty 2

## 2024-05-12 MED ORDER — PREDNISONE 20 MG PO TABS: 40.0000 mg | ORAL_TABLET | Freq: Every day | ORAL | 0 refills | Status: AC

## 2024-05-12 MED ORDER — DIPHENHYDRAMINE HCL 50 MG/ML IJ SOLN
25.0000 mg | Freq: Once | INTRAMUSCULAR | Status: AC
  Administered 2024-05-12: 25 mg via INTRAVENOUS
  Filled 2024-05-12: qty 1

## 2024-05-12 NOTE — ED Triage Notes (Signed)
 Pt with bilateral leg swelling and blisters that wrap around bilateral legs under his knee.  Wife states pt with hives to back and arms that itch.

## 2024-05-12 NOTE — ED Notes (Signed)
 Pt/family received d/c paperwork at this time. After going over the paperwork any questions, comments, or concerns were answered to the best of this nurse's knowledge. The pt/family verbally acknowledged the teachings/instructions.

## 2024-05-12 NOTE — Discharge Instructions (Signed)
 Patient taking prednisone  as prescribed.  May take 1 Benadryl  every 6 hours as needed for any additional itching.  Avoid rubbing areas is much as possible.  Wear Ace wrap on the leg for the next 2 to 3 days to help with compression and swelling as well.  Follow-up with the lymphedema clinic as scheduled.  Return to ED if any symptoms worsen including worsening redness spreading up the leg, new fevers, severe uncontrolled pain, difficulty breathing.

## 2024-05-12 NOTE — ED Provider Notes (Signed)
 [place] EMERGENCY DEPARTMENT AT [facility]: Lambert Rhonda W Provider Note   CSN: [id number] Arrival date & time: 05/12/24  1719     Patient presents with: Leg Swelling   Paul Lam is a 83 y.o. male.  Patient is an 83 year old male with a history of obesity, hypertension, A-fib with watchman implant, and lymphedema who presents to the ED for continued leg swelling/redness that worsened last evening.  Patient states he went to the lymphedema clinic 4 days ago and had mesh stockings placed.  He states he does not normally wear any compression sleeves this was the first time he had worn this.  He notes he took them off last evening.  He states when he woke up this morning, he noticed a red rash below the knees with some blisters that were itching very bad.  He also notes he had a rash on the bilateral arms, torso, and back.  He did take some Benadryl  earlier today with minimal relief.  States he has no known allergies previously.  He states his legs are always swollen but they have never itched like this previously.  Denies fevers, increasing shortness of breath, difficulty breathing, chest pain.  No further complaints.   HPI     Prior to Admission medications   Medication Sig Start Date End Date Taking? Authorizing Provider  predniSONE  (DELTASONE ) 20 MG tablet Take 2 tablets (40 mg total) by mouth daily for 5 days. 05/12/24 05/17/24 Yes Tariana Moldovan, Thersia GORMAN, PA-C  ALPRAZolam  (XANAX ) 0.5 MG tablet Take 1 tablet (0.5 mg total) by mouth at bedtime. Anxiety 04/26/24   Grooms, Charmaine, PA-C  ascorbic acid (VITAMIN C) 500 MG tablet Take 500 mg by mouth daily.    [provider]  aspirin  EC 81 MG tablet Take 1 tablet (81 mg total) by mouth daily. Swallow whole. 01/04/21   Wonda Sharper, MD  brimonidine  (ALPHAGAN ) 0.2 % ophthalmic solution Place 1 drop into the left eye in the morning and at bedtime. 05/20/21   [provider]  Calcium Polycarbophil (FIBER-CAPS PO) Take 1 capsule by  mouth daily.    [provider]  carboxymethylcellulose (REFRESH PLUS) 0.5 % SOLN Place 1 drop into the right eye daily as needed (dry eyes).    [provider]  celecoxib  (CELEBREX ) 200 MG capsule Take 1 capsule (200 mg total) by mouth 2 (two) times daily. 04/16/24   Grooms, Courtney, PA-C  Cholecalciferol (VITAMIN D3) 50 MCG (2000 UT) capsule Take 2,000 Units by mouth daily.    [provider]  cyanocobalamin  (VITAMIN B12) 500 MCG tablet Take 500 mcg by mouth daily.    [provider]  docusate sodium  (COLACE) 100 MG capsule Take 100 mg by mouth 2 (two) times daily.    [provider]  donepezil  (ARICEPT ) 5 MG tablet Take 1 tablet (5 mg total) by mouth at bedtime. 04/16/24   Grooms, Courtney, PA-C  finasteride  (PROSCAR ) 5 MG tablet Take 1 tablet (5 mg total) by mouth daily. 08/14/23   McKenzie, Belvie CROME, MD  furosemide  (LASIX ) 40 MG tablet Take 1 tablet (40 mg total) by mouth 2 (two) times daily. 05/09/24   Grooms, Courtney, PA-C  gabapentin  (NEURONTIN ) 100 MG capsule Take 1 capsule (100 mg total) by mouth at bedtime. 04/16/24   Grooms, Courtney, PA-C  ibuprofen (ADVIL) 100 MG tablet Take 200 mg by mouth every 6 (six) hours as needed for fever.    [provider]  metoprolol  tartrate (LOPRESSOR ) 25 MG tablet Take 0.5 tablets (  12.5 mg total) by mouth 2 (two) times daily. 04/16/24   Grooms, Courtney, PA-C  Misc Natural Products (TART CHERRY ADVANCED PO) Take 1 tablet by mouth daily.     [provider]  Misc Natural Products (TART CHERRY ADVANCED) CAPS Take 1 capsule by mouth once. 850 MG & 0.8% Anthocyanins    [provider]  NON FORMULARY at bedtime. CPAP    [provider]  potassium chloride  (KLOR-CON ) 10 MEQ tablet Take 1 tablet (10 mEq total) by mouth daily. 04/16/24   Grooms, Courtney, PA-C  psyllium (METAMUCIL) 58.6 % powder Take 1 packet by mouth 3 (three) times daily. 100 mg Psyllium Husk    [provider]  simvastatin  (ZOCOR ) 40 MG tablet Take 1 tablet (40 mg total) by mouth at bedtime. 04/16/24   Grooms, Courtney, PA-C  tamsulosin  (FLOMAX ) 0.4 MG CAPS capsule Take 1 capsule (0.4 mg total) by mouth in the morning and at bedtime. 08/14/23   McKenzie, Belvie CROME, MD  Zinc  50 MG TABS Take 50 mg by mouth daily.    [provider]    Allergies: Patient has no known allergies.    Review of Systems  Constitutional:  Negative for fever.  Respiratory:  Negative for shortness of breath.   Cardiovascular:  Negative for chest pain.  Musculoskeletal:  Positive for arthralgias.  Skin:  Positive for color change and wound.  All other systems reviewed and are negative.   Updated Vital Signs BP (!) 146/109 (BP Location: Right Arm)   Pulse 80   Temp 97.8 F (36.6 C) (Oral)   Resp 18   Ht 5' 6 (1.676 m)   Wt 108.9 kg   SpO2 98%   BMI 38.74 kg/m   Physical Exam Constitutional:      Appearance: He is obese.  HENT:     Head: Normocephalic and atraumatic.     Nose: Nose normal.     Mouth/Throat:     Mouth: Mucous membranes are moist.     Pharynx: Oropharynx is clear.  Cardiovascular:     Rate and Rhythm: Normal rate.     Pulses: Normal pulses.     Comments: PT pulses 2+ bilaterally Pulmonary:     Effort: Pulmonary effort is normal.     Breath sounds: Normal breath sounds.  Musculoskeletal:        General: Normal range of motion.  Skin:    General: Skin is warm.     Comments: Mild erythema to the bilateral lower extremities.  There is a dark ring present below the bilateral knees with few blisters.  No active bleeding or drainage.  No increased cutaneous warmth.  Diffuse 2+ nonpitting edema noted equally to bilateral lower extremities.  Mild macular rash present on the mid to lower back.  No urticaria present at this time.  No rashes present on the torso or arms.  Neurological:     Mental Status: He is alert and oriented to person, place, and time.  Psychiatric:         Mood and Affect: Mood normal.        Behavior: Behavior normal.     (all labs ordered are listed, but only abnormal results are displayed) Labs Reviewed  CBC WITH DIFFERENTIAL/PLATELET - Abnormal; Notable for the following components:      Result Value   Hemoglobin 12.8 (*)    Platelets 113 (*)    All other components within normal limits  COMPREHENSIVE METABOLIC PANEL WITH GFR - Abnormal;  Notable for the following components:   Calcium 8.8 (*)    All other components within normal limits  PRO BRAIN NATRIURETIC PEPTIDE - Abnormal; Notable for the following components:   Pro Brain Natriuretic Peptide 1,176.0 (*)    All other components within normal limits  URINALYSIS, ROUTINE W REFLEX MICROSCOPIC - Abnormal; Notable for the following components:   Color, Urine STRAW (*)    All other components within normal limits    EKG: None  Radiology: DG Chest Portable 1 View Result Date: 05/12/2024 EXAM: 1 VIEW(S) XRAY OF THE CHEST 05/12/2024 06:06:00 PM COMPARISON: 05/31/2022. CLINICAL HISTORY: sob sob FINDINGS: LUNGS AND PLEURA: No focal pulmonary opacity. No pleural effusion. No pneumothorax. HEART AND MEDIASTINUM: Cardiomegaly, aortic atherosclerosis. BONES AND SOFT TISSUES: No acute osseous abnormality. IMPRESSION: 1. No acute cardiopulmonary process. Electronically signed by: Franky Crease MD 05/12/2024 06:19 PM EST RP Workstation: [id number]      Medications Ordered in the ED  methylPREDNISolone  sodium succinate (SOLU-MEDROL ) 125 mg/2 mL injection 125 mg (125 mg Intravenous Given 05/12/24 1812)  diphenhydrAMINE  (BENADRYL ) injection 25 mg (25 mg Intravenous Given 05/12/24 1812)                                   Medical Decision Making Patient is an 83 year old with a history of CHF and chronic lymphedema who presents to the ED for a rash to the bilateral lower legs as well as a rash to the torso and back.  Wife notes patient's legs are chronically swollen but noticed the redness  with blisters this morning.  He has also had itching for a couple days.  Please see detailed HPI above.  On exam patient is alert and in no acute distress.  Physical exam as noted above.  Distal pulses intact.  He is noted to have chronic lymphedema to lower extremities but no obvious increased cutaneous warmth or signs of cellulitis.  Mild macular rash present on the back but otherwise no urticaria or signs of angioedema.  Patient recently went to the lymphedema clinic and had stocking hose placed on the legs and this is when the rash/itching began.  Suspect patient's symptoms are more likely secondary to contact dermatitis.  Lab workup reassuring today including no leukocytosis and stable BNP.  Less concerns for CHF exacerbation.  Otherwise well-appearing and ambulatory at his baseline.  Stable for discharge home.  Patient given Benadryl  and Solu-Medrol  while in ED and states feeling much better.  Will be prescribed prednisone  burst.  Legs wrapped in Ace wrap and advised to follow-up with the lymphedema clinic as scheduled on Thursday.  Return precautions provided for worsening symptoms.  Amount and/or Complexity of Data Reviewed Labs: ordered. Radiology: ordered.  Risk Prescription drug management.       Final diagnoses:  Irritant contact dermatitis due to other agents  Bilateral lower extremity edema    ED Discharge Orders          Ordered    predniSONE  (DELTASONE ) 20 MG tablet  Daily        05/12/24 1903               Neysa Thersia GORMAN DEVONNA 05/12/24 1915    Melvenia Motto, MD 05/12/24 2244

## 2024-05-13 ENCOUNTER — Ambulatory Visit: Payer: Self-pay | Admitting: Physician Assistant

## 2024-05-27 ENCOUNTER — Other Ambulatory Visit: Payer: Self-pay | Admitting: Physician Assistant

## 2024-06-05 NOTE — Progress Notes (Signed)
 [facility] OCCUPATIONAL THERAPY [facility] [facility]ATIONAL [facility]ERAPY 05/27/2024 Note Type: Treatment Note    Patient Name: Paul Lam Date of Birth:1940-10-15 Diagnosis:  Encounter Diagnosis  Name Primary?   Lymphedema Yes   Referring Provider:  Debera Jayson MATSU, MD   Date of Onset of Impairment-06/06/2021 Date OT Care Plan Established or Reviewed-05/09/2024 Date OT Treatment Started-05/09/2024  Visit Count: 5 Plan of Care Effective Date: 05/09/2024 - 06/27/2024  Assessment/Plan:   Assessment Assessment details:    Pt tolerated treatment methods well on this date. Pt demonstrated moderate lymphedema in BLE that softened  with manual lymph drainage. +1  on this date and therapist notes edema more in LLE than RLE.  No s/s of infection. Skin integrity is noted to be improved on this date. No redness noted.  Pt and wife were in good understanding of pneumatic pump.  Pt would benefit from continued outpatient OT to address all goals in order to reach highest level of potential.  Pt is an 83 year old male with medical and treatment dx of lymphedema and treatment dx of lymphedema. Pt demonstrates stage II lymphedema in bilateral lower extremities. Pt demonstrates body system impairments including edema, skin integrity that limits his ADL, IADL, ambulation. He would benefit from outpatient occupational therapy to address the above mentioned concerns in order to reach his highest level of function. Pt would also benefit from a pneumatic pump and compression garments, specifically circ aids for daily use and independent management of lymphedema.     Impairments: decreased skin integrity, decreased mobility, pain and increased edema     Prognosis: good prognosis   Personal Factors/Comorbidities: 1-2    Examination of Body Systems: activity/participation, integumentary, cardiovascular and lymphatic   Clinical Decision Making: moderate   Positive Prognosis Rationale: motivated for treatment and  severity of symptoms. Negative Prognosis Rationale: insight, Pain Status and body habitus.   Clinical Presentation: evolving  Therapy Goals     Goals:     Short Term Goals: 1. Pt's circumference of both R and L LE will decrease by 1cm at each measurement to increase ADL function in 4 weeks. 2. Pt will correctly demonstrate all therapeutic exercises of HEP Independently with 100% of carry over in home setting in 4 weeks.  3. Pt be fitted for compression garments with education on donning/wearing schedule in 4 weeks.  4. Pt will verbalize lymphedema risk precautions for prevention of exacerbation in 4 weeks.   Long Term Goals:  1. Pt will have 2 cm decrease of circumference on BLE measurements to increase ADL function in 8 weeks. 2. Pt will be fitted for pneumatic pump and educated on schedule to maintain results of manual lymph drainage/pneumatic pump in 8 weeks. 3. Pt/family will be educated on self MLD to increase home management independence in 8 weeks. 4. Pt will report decreased pain at all reported areas, 2/10 or below,  to improve QOL in 8 weeks.    Plan   Therapy options: will be seen for skilled occupational therapy services   Planned therapy interventions: 97110-Therapeutic Exercises, 97140-Manual Therapy, 97168-Re-evaluation (OT), 97530-Therapeutic Activities, 97750-Physical Performance Test, 97760-Orthotic Fitting/Training (initial encounter) and 97763-Orthotic and/or prosthetic management/training (subsequent encounter)   DME Equipment: compression garments and lymphedema bandages.   Frequency: 1-2x per week.   Duration in weeks: 8 weeks   Education provided to: patient.   Education provided: HEP, DME education, Equipment recommendations, Lymphedema precautions, Symptom management, Treatment options and plan, Importance of Therapy and Garment options   Education results: needs  reinforcement.   Communication/Consultation: Medicare Cert/POC sent to Referring Provider.    Total  Treatment Time: 60    Treatment rendered today:      Pt was positioned in supine with HOB elevated for comfort. Manual lymph drainage pretreatment was completed to R axilla and R trunk. Manual lymph drainage was completed to RLE. Manual lymph drainage pretreatment was completed to L axilla and L trunk. Manual lymph drainage was completed to LLE. Light compression placed to BLE with good tolerance. This was an abbreviated session as pt was educated on pneumatic pump on this date.     Subjective:   History of Present Condition    Date of onset:  06/06/2021   History of Present Condition/Chief Complaint:  Pt is an 83 year old male with a medical dx of lymphedema who reports that his legs have been swelling for the past 2 years. He reports that they have become progressively worse with no known causation. Of note, pt does have a significant cardiac hx. Pt reports that the swelling in his legs started swelling gradually and has gotten worse. He reports that he was referred to a lymphedema clinic in [place] 3 months ago but was never contacted. He has been to the ER multiple times due to this with little resolve. He is seeking assistance with lymphedema care in the outpatient setting.   PMH:  Anginal pain (HCC)    Anxiety    Arthritis     generalized arthritis   Atrial fibrillation (HCC)    CAD (coronary artery disease)    Cancer (HCC)     skin cancer removed left ear    Complication of anesthesia    Depression    Headache    History of echocardiogram 02/19/2009   EF 50-55%; LA mild-mod dilated;   History of kidney stones    History of nuclear stress test 02/19/2009   low risk, normal   Hyperlipidemia    Hypertension    Obesity (BMI 30-39.9)    OSA (obstructive sleep apnea)     severe with AHI 25/hr now on BiPAP  PONV (postoperative nausea and vomiting)    Retinal detachment     with proliferative vitreoretinopathy  Shortness of breath     related to weight   Sleep  apnea    Vertigo    Subjective:  Pt reports that he is well on this date. Pt comes in for short treatment prior to pneumatic pump education. Pt reports 1/10 pain in BLE with a heavy quality.     Quality of life:  Fair Pain:    Current pain rating:  1   At best pain rating:  0   At worst pain rating:  5  Location:  BLE   Quality:  Heavy   Alleviating factors: nothing.   Pain related Behaviors:  Avoidance   Progression:  Worsening   Red Flags:  Night pain and disturbed sleep Precautions/Equipment  Precautions:  Cardiac   Equipment Currently Used:  Single point cane and Shower chair Current Functional Status:   limited standing tolerance, limited walking tolerance, use of assistive device, limited exercise, limited recreation, limited household activities, difficulty with transfers, limited bending, unsteady gait and limited sitting tolerance Social Support:    Lives Environment:  One-story house   Lives with:  Spouse   Hand dominance:  Right   Communication Preference:  Verbal, written and visual Barriers to Learning:  No Barriers   Work/School:  Retired Treatments:    None  Current treatment: occupational therapy   Patient Goals:    Patient/Family goals for therapy:  Decreased edema, decreased pain, improve fit of clothing, improve fit of shoes, improved standing tolerance and obtain compression garment   Other patient goals:  Obtain pneumatic pump, Improve skin intergrity.   Objective:  Lymphedema:  Treatment:    Lymph node removal: No     Chemotherapy: No     Radiation therapy: No     Previous lymphedema treatment: No     Reoccurrence?: No   Current Management:    Current management details:  Pt's BLE are wrapped from most recent ED visit. Current compression is too difficult to put on.    Daytime:  Nothing   Nighttime:  Nothing Contraindications:    Contraindications:  General contraindications   General contraindications:  Hypertension Objective:    Location  of swelling:  right, left, toes, knee, calf, ankle and foot   Fold thickness:  Normal   Fibrosis: No     Skin assessment:  Discoloration, edema and taut   Skin assessment comments:  Dermatitis noted be present in BLE, worse in L vs R. Pt reports that he has topical cream that is being applied to address this.  Therapist removed multiple wrappings and skin was warm to the touch but cooled with removal of wrappings. Aquaphor was applied with improved coloration. Pt was encouraged to keep legs at a cooler temperature. Stockinette with mild compression placed instead of multiple wrappings.    Incision:  N/A   Color:  pink and red   Temperature:  Increased (see above)   Hair growth:  Reduced   Stemmer's sign:  Positive   Wound: No     Pitting: Yes     Numbers; edema:  1+   Additional objective findings:   Right Lower Extremity: Metatarsal heads: 26.5cm Heel: 37.5cm Ankle: 36.7cm 10cm: 38cm 20cm: 45cm Knee:  50cm  Left Lower Extremity: Metatarsal Heads: 26.5cm Heel: 36.5cm Ankle: 37cm 10cm: 39cm 20cm: 45.5cm Knee: 51cm    Posture/Observations:    Sitting:  Rounded shoulders   Standing:  Wide BOS, forward flexed posture and rounded shoulders   Sleeping:  Bed ROM and Strength:    ROM and Strength findings:  BLE ROM WFL Tests:    Lower Extremity Functional Scale Score (out of 80):  13   LLI Scale:  81   Charges rendered - Manual therapy - 30 minutes  I attest that I have reviewed the above information. Signed: Lauraine JAYSON Custard, OT 05/27/2024 10:07 AM

## 2024-06-06 NOTE — Progress Notes (Deleted)
 "  Cardiology Office Note    Date:  06/06/2024  ID:  Paul Lam, DOB 16-Feb-1941, MRN [id number] Cardiologist: Jayson Sierras, MD { :  History of Present Illness:    Paul Lam is a 84 y.o. male with past medical history of persistent atrial fibrillation (s/p Watchman Device placement in 08/2020), HTN, HLD, OSA, aortic stenosis and chronic lymphedema who presents to the office today for follow-up from a recent Emergency Department visit.   He was last examined by Dr. Sierras in 03/2024 and reported occasional palpitations but no persistent symptoms and did have NYHA class II dyspnea.  He did have chronic lymphedema despite compliance with Lasix  40 mg daily and was referred to PT for consideration of mechanical compression. He was continued on ASA 81 mg daily, Lasix  40 mg daily, Lopressor  12.5 mg twice daily and Simvastatin  40 mg daily.  In the interim, he presented to [facility] ED on 04/28/2024 for evaluation of worsening lower extremity edema.  His proBNP was elevated at 1392 and Lasix  was titrated to 60 mg daily.  Presented back to the ED on 05/05/2024 for similar issues.  His legs were wrapped while in the ED and he was encouraged to use compression stockings.  Lasix  was further titrated to 40 mg twice daily.  Was evaluated in the ED again on 05/12/2024 for a rash along his lower extremities after mesh stockings had recently been applied at the lymphedema clinic.  He did have a mild macular rash it was felt that symptoms are due to contact dermatitis.  He was prescribed a short course of Prednisone  and given Benadryl  and Solu-Medrol  while in the ED.  ROS: ***  Studies Reviewed:   EKG: EKG is*** ordered today and demonstrates ***   EKG Interpretation Date/Time:    Ventricular Rate:    PR Interval:    QRS Duration:    QT Interval:    QTC Calculation:   R Axis:      Text Interpretation:         Echocardiogram: 03/2024 IMPRESSIONS     1. Left ventricular ejection fraction,  by estimation, is 55 to 60%. The  left ventricle has normal function. The left ventricle has no regional  wall motion abnormalities. Left ventricular diastolic parameters are  indeterminate.   2. Right ventricular systolic function is normal. The right ventricular  size is normal. Tricuspid regurgitation signal is inadequate for assessing  PA pressure.   3. Left atrial size was mildly dilated.   4. The mitral valve is abnormal. Mild mitral valve regurgitation. No  evidence of mitral stenosis.   5. The aortic valve is tricuspid. There is moderate calcification of the  aortic valve. There is moderate thickening of the aortic valve. Aortic  valve regurgitation is moderate. Moderate aortic valve stenosis. Aortic  valve area, by VTI measures 1.38 cm.   Aortic valve mean gradient measures 20.0 mmHg.   6. The inferior vena cava is dilated in size with >50% respiratory  variability, suggesting right atrial pressure of 8 mmHg.    Risk Assessment/Calculations:   {Does this patient have ATRIAL FIBRILLATION?:[phone]} No BP recorded.  {Refresh Note OR Click here to enter BP  :1}***         Physical Exam:   VS:  There were no vitals taken for this visit.   Wt Readings from Last 3 Encounters:  05/12/24 240 lb (108.9 kg)  05/09/24 246 lb (111.6 kg)  05/05/24 248 lb (112.5 kg)  GEN: Well nourished, well developed in no acute distress NECK: No JVD; No carotid bruits CARDIAC: ***RRR, no murmurs, rubs, gallops RESPIRATORY:  Clear to auscultation without rales, wheezing or rhonchi  ABDOMEN: Appears non-distended. No obvious abdominal masses. EXTREMITIES: No clubbing or cyanosis. No edema.  Distal pedal pulses are 2+ bilaterally.   Assessment and Plan:      {Are you ordering a CV Procedure (e.g. stress test, cath, DCCV, TEE, etc)?   Press F2        :[id number]}   Signed, Laymon CHRISTELLA Qua, PA-C   "

## 2024-06-07 ENCOUNTER — Ambulatory Visit: Admitting: Student

## 2024-06-17 NOTE — Progress Notes (Signed)
 [facility] OCCUPATIONAL THERAPY [facility]CC[facility]ATIONAL [facility]ERAPY 06/12/2024 Note Type: Treatment Note    Patient Name: Paul Lam Date of Birth:24-Jan-1941 Diagnosis:  Encounter Diagnosis  Name Primary?   Lymphedema Yes   Referring Provider:  Debera Jayson MATSU, MD   Date of Onset of Impairment-06/06/2021 Date OT Care Plan Established or Reviewed-05/09/2024 Date OT Treatment Started-05/09/2024  Visit Count: 7 Plan of Care Effective Date: 05/09/2024 - 06/27/2024  Assessment/Plan:   Assessment Assessment details:    Pt tolerated treatment methods well on this date. Pt demonstrated moderate lymphedema in BLE that softened  with manual lymph drainage. +1  on this date and therapist notes edema more in LLE than RLE.  No s/s of infection. Skin integrity is noted to be improved on this date. No redness noted.   Pt would benefit from continued outpatient OT to address all goals in order to reach highest level of potential.    Pt is an 84 year old male with medical and treatment dx of lymphedema and treatment dx of lymphedema. Pt demonstrates stage II lymphedema in bilateral lower extremities. Pt demonstrates body system impairments including edema, skin integrity that limits his ADL, IADL, ambulation. He would benefit from outpatient occupational therapy to address the above mentioned concerns in order to reach his highest level of function. Pt would also benefit from a pneumatic pump and compression garments, specifically circ aids for daily use and independent management of lymphedema.     Impairments: decreased skin integrity, decreased mobility, pain and increased edema     Prognosis: good prognosis   Personal Factors/Comorbidities: 1-2    Examination of Body Systems: activity/participation, integumentary, cardiovascular and lymphatic   Clinical Decision Making: moderate   Positive Prognosis Rationale: motivated for treatment and severity of symptoms. Negative Prognosis Rationale:  insight, Pain Status and body habitus.   Clinical Presentation: evolving  Therapy Goals     Goals:     Short Term Goals: 1. Pt's circumference of both R and L LE will decrease by 1cm at each measurement to increase ADL function in 4 weeks. 2. Pt will correctly demonstrate all therapeutic exercises of HEP Independently with 100% of carry over in home setting in 4 weeks.  3. Pt be fitted for compression garments with education on donning/wearing schedule in 4 weeks.  4. Pt will verbalize lymphedema risk precautions for prevention of exacerbation in 4 weeks.   Long Term Goals:  1. Pt will have 2 cm decrease of circumference on BLE measurements to increase ADL function in 8 weeks. 2. Pt will be fitted for pneumatic pump and educated on schedule to maintain results of manual lymph drainage/pneumatic pump in 8 weeks. 3. Pt/family will be educated on self MLD to increase home management independence in 8 weeks. 4. Pt will report decreased pain at all reported areas, 2/10 or below,  to improve QOL in 8 weeks.    Plan   Therapy options: will be seen for skilled occupational therapy services   Planned therapy interventions: 97110-Therapeutic Exercises, 97140-Manual Therapy, 97168-Re-evaluation (OT), 97530-Therapeutic Activities, 97750-Physical Performance Test, 97760-Orthotic Fitting/Training (initial encounter) and 97763-Orthotic and/or prosthetic management/training (subsequent encounter)   DME Equipment: compression garments and lymphedema bandages.   Frequency: 1-2x per week.   Duration in weeks: 8 weeks   Education provided to: patient.   Education provided: HEP, DME education, Equipment recommendations, Lymphedema precautions, Symptom management, Treatment options and plan, Importance of Therapy and Garment options   Education results: needs reinforcement.   Communication/Consultation: Medicare Cert/POC sent to  Referring Provider.    Total Treatment Time: 55    Treatment rendered today:      Pt was positioned in supine with HOB elevated for comfort. Manual lymph drainage pretreatment was completed to R axilla and R trunk. Manual lymph drainage was completed to RLE. Manual lymph drainage pretreatment was completed to L axilla and L trunk. Manual lymph drainage was completed to LLE. Light compression placed to BLE with good tolerance.     Subjective:   History of Present Condition    Date of onset:  06/06/2021   History of Present Condition/Chief Complaint:  Pt is an 84 year old male with a medical dx of lymphedema who reports that his legs have been swelling for the past 2 years. He reports that they have become progressively worse with no known causation. Of note, pt does have a significant cardiac hx. Pt reports that the swelling in his legs started swelling gradually and has gotten worse. He reports that he was referred to a lymphedema clinic in [place] 3 months ago but was never contacted. He has been to the ER multiple times due to this with little resolve. He is seeking assistance with lymphedema care in the outpatient setting.   PMH:  Anginal pain (HCC)    Anxiety    Arthritis     generalized arthritis   Atrial fibrillation (HCC)    CAD (coronary artery disease)    Cancer (HCC)     skin cancer removed left ear    Complication of anesthesia    Depression    Headache    History of echocardiogram 02/19/2009   EF 50-55%; LA mild-mod dilated;   History of kidney stones    History of nuclear stress test 02/19/2009   low risk, normal   Hyperlipidemia    Hypertension    Obesity (BMI 30-39.9)    OSA (obstructive sleep apnea)     severe with AHI 25/hr now on BiPAP  PONV (postoperative nausea and vomiting)    Retinal detachment     with proliferative vitreoretinopathy  Shortness of breath     related to weight   Sleep apnea    Vertigo    Subjective:  Pt reports that he is well on this date. No pain reported.     Quality of life:  Fair Pain:     Current pain rating:  0   At best pain rating:  0   At worst pain rating:  5  Location:  BLE   Quality:  Heavy   Alleviating factors: nothing.   Pain related Behaviors:  Avoidance   Progression:  Worsening   Red Flags:  Night pain and disturbed sleep Precautions/Equipment  Precautions:  Cardiac   Equipment Currently Used:  Single point cane and Shower chair Current Functional Status:   limited standing tolerance, limited walking tolerance, use of assistive device, limited exercise, limited recreation, limited household activities, difficulty with transfers, limited bending, unsteady gait and limited sitting tolerance Social Support:    Lives Environment:  One-story house   Lives with:  Spouse   Hand dominance:  Right   Communication Preference:  Verbal, written and visual Barriers to Learning:  No Barriers   Work/School:  Retired Treatments:    None     Current treatment: occupational therapy   Patient Goals:    Patient/Family goals for therapy:  Decreased edema, decreased pain, improve fit of clothing, improve fit of shoes, improved standing tolerance and obtain compression garment   Other patient  goals:  Obtain pneumatic pump, Improve skin intergrity.   Objective:  Lymphedema:  Treatment:    Lymph node removal: No     Chemotherapy: No     Radiation therapy: No     Previous lymphedema treatment: No     Reoccurrence?: No   Current Management:    Current management details:  Pt's BLE are wrapped from most recent ED visit. Current compression is too difficult to put on.    Daytime:  Nothing   Nighttime:  Nothing Contraindications:    Contraindications:  General contraindications   General contraindications:  Hypertension Objective:    Location of swelling:  right, left, toes, knee, calf, ankle and foot   Fold thickness:  Normal   Fibrosis: No     Skin assessment:  Discoloration, edema and taut   Skin assessment comments:  Dermatitis noted be present in BLE, worse in L vs  R. Pt reports that he has topical cream that is being applied to address this.  Therapist removed multiple wrappings and skin was warm to the touch but cooled with removal of wrappings. Aquaphor was applied with improved coloration. Pt was encouraged to keep legs at a cooler temperature. Stockinette with mild compression placed instead of multiple wrappings.    Incision:  N/A   Color:  pink and red   Temperature:  Increased (see above)   Hair growth:  Reduced   Stemmer's sign:  Positive   Wound: No     Pitting: Yes     Numbers; edema:  1+   Additional objective findings:   Right Lower Extremity: Metatarsal heads: 26.5cm Heel: 37.5cm Ankle: 36.7cm 10cm: 38cm 20cm: 45cm Knee:  50cm  Left Lower Extremity: Metatarsal Heads: 26.5cm Heel: 36.5cm Ankle: 37cm 10cm: 39cm 20cm: 45.5cm Knee: 51cm    Posture/Observations:    Sitting:  Rounded shoulders   Standing:  Wide BOS, forward flexed posture and rounded shoulders   Sleeping:  Bed ROM and Strength:    ROM and Strength findings:  BLE ROM WFL Tests:    Lower Extremity Functional Scale Score (out of 80):  13   LLI Scale:  81   Charges rendered - Manual therapy - 55 minutes  I attest that I have reviewed the above information. Signed: Lauraine JAYSON Custard, OT 06/12/2024 7:48 AM

## 2024-06-20 ENCOUNTER — Ambulatory Visit: Attending: Student | Admitting: Student

## 2024-06-20 ENCOUNTER — Encounter: Payer: Self-pay | Admitting: Student

## 2024-06-20 VITALS — BP 116/58 | HR 52 | Ht 66.0 in | Wt 238.0 lb

## 2024-06-20 DIAGNOSIS — E782 Mixed hyperlipidemia: Secondary | ICD-10-CM | POA: Diagnosis not present

## 2024-06-20 DIAGNOSIS — I10 Essential (primary) hypertension: Secondary | ICD-10-CM

## 2024-06-20 DIAGNOSIS — I350 Nonrheumatic aortic (valve) stenosis: Secondary | ICD-10-CM | POA: Diagnosis not present

## 2024-06-20 DIAGNOSIS — I4821 Permanent atrial fibrillation: Secondary | ICD-10-CM

## 2024-06-20 DIAGNOSIS — I890 Lymphedema, not elsewhere classified: Secondary | ICD-10-CM

## 2024-06-20 NOTE — Progress Notes (Signed)
 "  Cardiology Office Note    Date:  06/20/2024  ID:  Paul Lam, DOB 10/24/1940, MRN [id number] Cardiologist: Jayson Sierras, MD { :  History of Present Illness:    Paul Lam is a 84 y.o. male with past medical history of persistent atrial fibrillation (s/p Watchman Device placement in 08/2020), HTN, HLD, OSA, aortic stenosis and chronic lymphedema who presents to the office today for follow-up from a recent Emergency Department visit.   He was last examined by Dr. Sierras in 03/2024 and reported occasional palpitations but no persistent symptoms and did have NYHA class II dyspnea. He did have chronic lymphedema despite compliance with Lasix  40 mg daily and was referred to PT for consideration of mechanical compression. He was continued on ASA 81 mg daily, Lasix  40 mg daily, Lopressor  12.5 mg twice daily and Simvastatin  40 mg daily.  In the interim, he presented to [facility] ED on 04/28/2024 for evaluation of worsening lower extremity edema. His proBNP was elevated at 1392 and Lasix  was titrated to 60 mg daily. Presented back to the ED on 05/05/2024 for similar issues. His legs were wrapped while in the ED and he was encouraged to use compression stockings. Lasix  was further titrated to 40 mg twice daily. Was evaluated in the ED again on 05/12/2024 for a rash along his lower extremities after mesh stockings had recently been applied at the lymphedema clinic.  He did have a mild macular rash it was felt that symptoms are due to contact dermatitis. He was prescribed a short course of Prednisone  and given Benadryl  and Solu-Medrol  while in the ED.  In talking with the patient and his wife today, he reports significant improvement in his lower extremity edema since going to the lymphedema clinic and having his legs wrapped. He is not overly active at baseline and his wife reports the most active thing he typically does is walk around the home. He does occasionally go to the grocery store but  typically sits in the car while she does the shopping. He denies any recent exertional chest pain or progressive dyspnea on exertion. No specific orthopnea or PND. He does experience occasional palpitations but no persistent symptoms. He does have a smart watch but was unaware of how to check his heart rate and we reviewed how to do so today. He does report having headaches which occur sporadically and are located along his upper right eyelid. He has not tried taking anything for the headaches and we reviewed that he could try Tylenol  initially. Did encourage him to check his vitals when these episodes occur.  Studies Reviewed:   EKG: EKG is not ordered today.  Echocardiogram: 03/2024 IMPRESSIONS     1. Left ventricular ejection fraction, by estimation, is 55 to 60%. The  left ventricle has normal function. The left ventricle has no regional  wall motion abnormalities. Left ventricular diastolic parameters are  indeterminate.   2. Right ventricular systolic function is normal. The right ventricular  size is normal. Tricuspid regurgitation signal is inadequate for assessing  PA pressure.   3. Left atrial size was mildly dilated.   4. The mitral valve is abnormal. Mild mitral valve regurgitation. No  evidence of mitral stenosis.   5. The aortic valve is tricuspid. There is moderate calcification of the  aortic valve. There is moderate thickening of the aortic valve. Aortic  valve regurgitation is moderate. Moderate aortic valve stenosis. Aortic  valve area, by VTI measures 1.38 cm.   Aortic  valve mean gradient measures 20.0 mmHg.   6. The inferior vena cava is dilated in size with >50% respiratory  variability, suggesting right atrial pressure of 8 mmHg.    Risk Assessment/Calculations:    CHA2DS2-VASc Score = 4  This indicates a 4.8% annual risk of stroke. The patient's score is based upon: CHF History: 0 HTN History: 1 Diabetes History: 0 Stroke History: 0 Vascular Disease  History: 1 Age Score: 2 Gender Score: 0    Physical Exam:   VS:  BP (!) 116/58   Pulse (!) 52   Ht 5' 6 (1.676 m)   Wt 238 lb (108 kg)   SpO2 100%   BMI 38.41 kg/m    Wt Readings from Last 3 Encounters:  06/20/24 238 lb (108 kg)  05/12/24 240 lb (108.9 kg)  05/09/24 246 lb (111.6 kg)     GEN: Pleasant, elderly male appearing in no acute distress NECK: No JVD; No carotid bruits CARDIAC: Irregularly irregular, 2/6 SEM along RUSB.  RESPIRATORY:  Clear to auscultation without rales, wheezing or rhonchi  ABDOMEN: Appears non-distended. No obvious abdominal masses. EXTREMITIES: No clubbing or cyanosis. Chronic appearing edema bilaterally with wraps in place. No pitting edema.  Distal pedal pulses are 2+ bilaterally.   Assessment and Plan:   1. Lymphedema - Symptoms have significantly improved since being followed by the Lymphedema Clinic and undergoing mechanical compression. Would continue current diuretic therapy with Lasix  40 mg twice daily. Labs on 05/12/2024 showed his K+ was normal at 3.5 and creatinine stable at 1.03.  2. Permanent atrial fibrillation (HCC) - He does report occasional palpitations but no persistent symptoms. Given his heart rate in the 50's, would not further titrate Lopressor  at this time. Continue current dosing with Lopressor  12.5 mg twice daily. I did encourage him to follow his heart rate on his iWatch. - He is no longer on anticoagulation as he previously underwent Watchman device placement.  3. Nonrheumatic aortic valve stenosis - Echocardiogram in 03/2024 showed moderate AS. Would anticipate repeat imaging in 1 to 2 years for repeat assessment.  4. Essential hypertension - BP is well-controlled at 116/58 during today's visit. Continue Lasix  40 mg twice daily and Lopressor  12.5 mg twice daily.  5. Mixed hyperlipidemia - LDL was at 39 in 01/2024 by review of [facility] DXA. Continue current medical therapy with Simvastatin  40 mg daily.  6.  Headaches - He reports sporadic headaches and by description, these sound most consistent with tension headaches. He denies any focal neurological deficits. Encouraged him to check his vitals when these episodes occur and try taking Tylenol . If no improvement, I encouraged him to follow-up with his PCP as he may require additional workup and imaging.   Disposition: He has previously scheduled follow-up with Dr. Debera in 08/2024 and he prefers to keep that appointment.   Signed, Laymon CHRISTELLA Qua, PA-C   "

## 2024-06-20 NOTE — Patient Instructions (Signed)
 Medication Instructions:  Your physician recommends that you continue on your current medications as directed. Please refer to the Current Medication list given to you today.' *If you need a refill on your cardiac medications before your next appointment, please call your pharmacy*  Lab Work: NONE   If you have labs (blood work) drawn today and your tests are completely normal, you will receive your results only by: MyChart Message (if you have MyChart) OR A paper copy in the mail If you have any lab test that is abnormal or we need to change your treatment, we will call you to review the results.  Testing/Procedures: NONE   Follow-Up: At [facility], you and your health needs are our priority.  As part of our continuing mission to provide you with exceptional heart care, our providers are all part of one team.  This team includes your primary Cardiologist (physician) and Advanced Practice Providers or APPs (Physician Assistants and Nurse Practitioners) who all work together to provide you with the care you need, when you need it.  Your next appointment:    March   Provider:   Jayson Sierras, MD    We recommend signing up for the patient portal called MyChart.  Sign up information is provided on this After Visit Summary.  MyChart is used to connect with patients for Virtual Visits (Telemedicine).  Patients are able to view lab/test results, encounter notes, upcoming appointments, etc.  Non-urgent messages can be sent to your provider as well.   To learn more about what you can do with MyChart, go to [email or web address].   Other Instructions Thank you for choosing [facility]!

## 2024-06-26 NOTE — Progress Notes (Signed)
 [facility] OCCUPATIONAL THERAPY [facility]Y 06/26/2024 Note Type: Treatment Note    Patient Name: Paul Lam Date of Birth:04-Apr-1941 Diagnosis:  Encounter Diagnosis  Name Primary?   Lymphedema Yes   Referring Provider:  Debera Jayson MATSU, MD   Date of Onset of Impairment-06/06/2021 Date OT Care Plan Established or Reviewed-05/09/2024 Date OT Treatment Started-05/09/2024  Visit Count: 9 Plan of Care Effective Date: 05/09/2024 - 06/27/2024  Assessment/Plan:   Assessment Assessment details:    Pt tolerated treatment methods well on this date. Pt demonstrated moderate lymphedema in BLE that softened  with manual lymph drainage. +1  on this date and therapist notes edema more in LLE than RLE, however, visually less edema overall.  No s/s of infection. Skin integrity is noted to be improved on this date. No redness noted.   Pt would benefit from continued outpatient OT to address all goals in order to reach highest level of potential.    Pt is an 84 year old male with medical and treatment dx of lymphedema and treatment dx of lymphedema. Pt demonstrates stage II lymphedema in bilateral lower extremities. Pt demonstrates body system impairments including edema, skin integrity that limits his ADL, IADL, ambulation. He would benefit from outpatient occupational therapy to address the above mentioned concerns in order to reach his highest level of function. Pt would also benefit from a pneumatic pump and compression garments, specifically circ aids for daily use and independent management of lymphedema.     Impairments: decreased skin integrity, decreased mobility, pain and increased edema     Prognosis: good prognosis   Personal Factors/Comorbidities: 1-2    Examination of Body Systems: activity/participation, integumentary, cardiovascular and lymphatic   Clinical Decision Making: moderate   Positive Prognosis Rationale: motivated for treatment and severity of  symptoms. Negative Prognosis Rationale: insight, Pain Status and body habitus.   Clinical Presentation: evolving  Therapy Goals     Goals:     Short Term Goals: 1. Pt's circumference of both R and L LE will decrease by 1cm at each measurement to increase ADL function in 4 weeks. 2. Pt will correctly demonstrate all therapeutic exercises of HEP Independently with 100% of carry over in home setting in 4 weeks.  3. Pt be fitted for compression garments with education on donning/wearing schedule in 4 weeks.  4. Pt will verbalize lymphedema risk precautions for prevention of exacerbation in 4 weeks.   Long Term Goals:  1. Pt will have 2 cm decrease of circumference on BLE measurements to increase ADL function in 8 weeks. 2. Pt will be fitted for pneumatic pump and educated on schedule to maintain results of manual lymph drainage/pneumatic pump in 8 weeks. 3. Pt/family will be educated on self MLD to increase home management independence in 8 weeks. 4. Pt will report decreased pain at all reported areas, 2/10 or below,  to improve QOL in 8 weeks.    Plan   Therapy options: will be seen for skilled occupational therapy services   Planned therapy interventions: 97110-Therapeutic Exercises, 97140-Manual Therapy, 97168-Re-evaluation (OT), 97530-Therapeutic Activities, 97750-Physical Performance Test, 97760-Orthotic Fitting/Training (initial encounter) and 97763-Orthotic and/or prosthetic management/training (subsequent encounter)   DME Equipment: compression garments and lymphedema bandages.   Frequency: 1-2x per week.   Duration in weeks: 8 weeks   Education provided to: patient.   Education provided: HEP, DME education, Equipment recommendations, Lymphedema precautions, Symptom management, Treatment options and plan, Importance of Therapy and Garment options   Education results: needs reinforcement.  Communication/Consultation: Medicare Cert/POC sent to Referring Provider.    Total Treatment  Time: 55    Treatment rendered today:     Pt was positioned in supine with HOB elevated for comfort. Manual lymph drainage pretreatment was completed to R axilla and R trunk. Manual lymph drainage was completed to RLE. Manual lymph drainage pretreatment was completed to L axilla and L trunk. Manual lymph drainage was completed to LLE. Light compression placed to BLE with good tolerance.     Subjective:   History of Present Condition    Date of onset:  06/06/2021   History of Present Condition/Chief Complaint:  Pt is an 84 year old male with a medical dx of lymphedema who reports that his legs have been swelling for the past 2 years. He reports that they have become progressively worse with no known causation. Of note, pt does have a significant cardiac hx. Pt reports that the swelling in his legs started swelling gradually and has gotten worse. He reports that he was referred to a lymphedema clinic in [place] 3 months ago but was never contacted. He has been to the ER multiple times due to this with little resolve. He is seeking assistance with lymphedema care in the outpatient setting.   PMH:  Anginal pain (HCC)    Anxiety    Arthritis     generalized arthritis   Atrial fibrillation (HCC)    CAD (coronary artery disease)    Cancer (HCC)     skin cancer removed left ear    Complication of anesthesia    Depression    Headache    History of echocardiogram 02/19/2009   EF 50-55%; LA mild-mod dilated;   History of kidney stones    History of nuclear stress test 02/19/2009   low risk, normal   Hyperlipidemia    Hypertension    Obesity (BMI 30-39.9)    OSA (obstructive sleep apnea)     severe with AHI 25/hr now on BiPAP  PONV (postoperative nausea and vomiting)    Retinal detachment     with proliferative vitreoretinopathy  Shortness of breath     related to weight   Sleep apnea    Vertigo    Subjective:  Pt reports that he is well on this date. No pain  reported. He reports that his legs are smaller.     Quality of life:  Fair Pain:    Current pain rating:  0   At best pain rating:  0   At worst pain rating:  5  Location:  BLE   Quality:  Heavy   Alleviating factors: nothing.   Pain related Behaviors:  Avoidance   Progression:  Worsening   Red Flags:  Night pain and disturbed sleep Precautions/Equipment  Precautions:  Cardiac   Equipment Currently Used:  Single point cane and Shower chair Current Functional Status:   limited standing tolerance, limited walking tolerance, use of assistive device, limited exercise, limited recreation, limited household activities, difficulty with transfers, limited bending, unsteady gait and limited sitting tolerance Social Support:    Lives Environment:  One-story house   Lives with:  Spouse   Hand dominance:  Right   Communication Preference:  Verbal, written and visual Barriers to Learning:  No Barriers   Work/School:  Retired Treatments:    None     Current treatment: occupational therapy   Patient Goals:    Patient/Family goals for therapy:  Decreased edema, decreased pain, improve fit of clothing, improve fit of  shoes, improved standing tolerance and obtain compression garment   Other patient goals:  Obtain pneumatic pump, Improve skin intergrity.   Objective:  Lymphedema:  Treatment:    Lymph node removal: No     Chemotherapy: No     Radiation therapy: No     Previous lymphedema treatment: No     Reoccurrence?: No   Current Management:    Current management details:  Pt's BLE are wrapped from most recent ED visit. Current compression is too difficult to put on.    Daytime:  Nothing   Nighttime:  Nothing Contraindications:    Contraindications:  General contraindications   General contraindications:  Hypertension Objective:    Location of swelling:  right, left, toes, knee, calf, ankle and foot   Fold thickness:  Normal   Fibrosis: No     Skin assessment:  Discoloration, edema  and taut   Skin assessment comments:  Dermatitis noted be present in BLE, worse in L vs R. Pt reports that he has topical cream that is being applied to address this.  Therapist removed multiple wrappings and skin was warm to the touch but cooled with removal of wrappings. Aquaphor was applied with improved coloration. Pt was encouraged to keep legs at a cooler temperature. [place]ette with mild compression placed instead of multiple wrappings.    Incision:  N/A   Color:  pink and red   Temperature:  Increased (see above)   Hair growth:  Reduced   Stemmer's sign:  Positive   Wound: No     Pitting: Yes     Numbers; edema:  1+   Additional objective findings:   Right Lower Extremity: Metatarsal heads: 26.5cm Heel: 37.5cm Ankle: 36.7cm 10cm: 38cm 20cm: 45cm Knee:  50cm  Left Lower Extremity: Metatarsal Heads: 26.5cm Heel: 36.5cm Ankle: 37cm 10cm: 39cm 20cm: 45.5cm Knee: 51cm    Posture/Observations:    Sitting:  Rounded shoulders   Standing:  Wide BOS, forward flexed posture and rounded shoulders   Sleeping:  Bed ROM and Strength:    ROM and Strength findings:  BLE ROM WFL Tests:    Lower Extremity Functional Scale Score (out of 80):  13   LLI Scale:  81   Charges rendered - Manual therapy - 55 minutes  I attest that I have reviewed the above information. Signed: Lauraine JAYSON Custard, OT 06/26/2024 5:48 PM

## 2024-08-14 ENCOUNTER — Ambulatory Visit: Admitting: Urology

## 2024-08-22 ENCOUNTER — Encounter (INDEPENDENT_AMBULATORY_CARE_PROVIDER_SITE_OTHER): Admitting: Ophthalmology

## 2024-08-28 ENCOUNTER — Ambulatory Visit: Admitting: Cardiology

## 2024-08-29 ENCOUNTER — Encounter (INDEPENDENT_AMBULATORY_CARE_PROVIDER_SITE_OTHER): Admitting: Ophthalmology

## 2024-10-14 ENCOUNTER — Ambulatory Visit: Admitting: Family Medicine
